# Patient Record
Sex: Male | Born: 1985
Health system: Southern US, Community
[De-identification: ages and names within clinical notes are randomized; demographics above are authoritative.]

## PROBLEM LIST (undated history)

## (undated) DIAGNOSIS — R0600 Dyspnea, unspecified: Secondary | ICD-10-CM

## (undated) DIAGNOSIS — H539 Unspecified visual disturbance: Secondary | ICD-10-CM

## (undated) DIAGNOSIS — F329 Major depressive disorder, single episode, unspecified: Secondary | ICD-10-CM

## (undated) DIAGNOSIS — F32A Depression, unspecified: Secondary | ICD-10-CM

## (undated) DIAGNOSIS — G35 Multiple sclerosis: Secondary | ICD-10-CM

## (undated) HISTORY — PX: NO PAST SURGERIES: SHX2092

## (undated) HISTORY — DX: Multiple sclerosis: G35

## (undated) HISTORY — DX: Unspecified visual disturbance: H53.9

---
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
## 2013-11-02 ENCOUNTER — Emergency Department (HOSPITAL_COMMUNITY)
Admission: EM | Admit: 2013-11-02 | Discharge: 2013-11-02 | Disposition: A | Discharge status: Home / Self Care | Payer: Self-pay | Attending: Emergency Medicine | Admitting: Emergency Medicine

## 2013-11-02 ENCOUNTER — Encounter (HOSPITAL_COMMUNITY): Payer: Self-pay | Admitting: Emergency Medicine

## 2013-11-02 DIAGNOSIS — R4182 Altered mental status, unspecified: Secondary | ICD-10-CM | POA: Insufficient documentation

## 2013-11-02 DIAGNOSIS — F1210 Cannabis abuse, uncomplicated: Secondary | ICD-10-CM | POA: Insufficient documentation

## 2013-11-02 DIAGNOSIS — F1910 Other psychoactive substance abuse, uncomplicated: Secondary | ICD-10-CM

## 2013-11-02 DIAGNOSIS — F17200 Nicotine dependence, unspecified, uncomplicated: Secondary | ICD-10-CM | POA: Insufficient documentation

## 2013-11-02 LAB — COMPREHENSIVE METABOLIC PANEL
ALBUMIN: 3.7 g/dL (ref 3.5–5.2)
ALT: 11 U/L (ref 0–53)
AST: 16 U/L (ref 0–37)
Alkaline Phosphatase: 73 U/L (ref 39–117)
BUN: 10 mg/dL (ref 6–23)
CALCIUM: 9 mg/dL (ref 8.4–10.5)
CO2: 24 mEq/L (ref 19–32)
CREATININE: 0.94 mg/dL (ref 0.50–1.35)
Chloride: 103 mEq/L (ref 96–112)
GFR calc Af Amer: >90 mL/min (ref >90)
GFR calc non Af Amer: >90 mL/min (ref >90)
Glucose, Bld: 101 mg/dL — ABNORMAL HIGH (ref 70–99)
POTASSIUM: 4.2 meq/L (ref 3.7–5.3)
Sodium: 140 mEq/L (ref 137–147)
TOTAL PROTEIN: 6.5 g/dL (ref 6.0–8.3)
Total Bilirubin: <0.2 mg/dL — ABNORMAL LOW (ref 0.3–1.2)

## 2013-11-02 LAB — CBC
HCT: 38.6 % — ABNORMAL LOW (ref 39.0–52.0)
Hemoglobin: 12.9 g/dL — ABNORMAL LOW (ref 13.0–17.0)
MCH: 29.9 pg (ref 26.0–34.0)
MCHC: 33.4 g/dL (ref 30.0–36.0)
MCV: 89.6 fL (ref 78.0–100.0)
Platelets: 250 10*3/uL (ref 150–400)
RBC: 4.31 MIL/uL (ref 4.22–5.81)
RDW: 13.2 % (ref 11.5–15.5)
WBC: 7.5 10*3/uL (ref 4.0–10.5)

## 2013-11-02 LAB — SALICYLATE LEVEL

## 2013-11-02 LAB — RAPID URINE DRUG SCREEN, HOSP PERFORMED
Amphetamines: NOT DETECTED
Barbiturates: NOT DETECTED
Benzodiazepines: NOT DETECTED
COCAINE: NOT DETECTED
Opiates: NOT DETECTED
Tetrahydrocannabinol: POSITIVE — AB

## 2013-11-02 LAB — ACETAMINOPHEN LEVEL

## 2013-11-02 LAB — ETHANOL

## 2013-11-02 NOTE — ED Notes (Addendum)
In addition to below note, pt states that he used to smoke marijuana regularly, quit smoking for 5-6 months, and smoked again today for the first time in 5-6 months. Pt on 5-lead cardiac monitor.

## 2013-11-02 NOTE — Progress Notes (Signed)
  CARE MANAGEMENT ED NOTE 11/02/2013  Patient:  Mike Lowery,Mike Lowery   Account Number:  [id number]  Date Initiated:  11/02/2013  Documentation initiated by:  Radford PaxFERRERO,AMY  Subjective/Objective Assessment:   Patient presents to  Ed with seizure like activity     Subjective/Objective Assessment Detail:     Action/Plan:   Action/Plan Detail:   Anticipated DC Date:  11/02/2013     Status Recommendation to Physician:   Result of Recommendation:    Other ED Services  Consult Working Plan    DC Planning Services  Other  PCP issues    Choice offered to / List presented to:            Status of service:  Completed, signed off  ED Comments:   ED Comments Detail:  EDCM spoke to patient at bedside.  Patient confirms he does not have  a pcp or insurance.  [facility] provided patient with a list of pcps whoa ccept self pay patients, address and phone number of [facility].  [facility] - Thurs from 9am to 1030am. [facility] informed [facility] that he may enroll into the orange card program there and speak to a financial counselor if needed, also would assist with his medications. [facility] provided phone number to inquire about Medicaid and Affordable Care Act. Patient reports he will applying for Medicaid on Monday. [facility] provided patient with discounted pharmacy list, and websites needymeds.org and Good [email or web address] for medication assist, financial resources in the community such as local churches, salvation army, urban ministries and a dental assistance for uninsured patients.  Patient thankful for resources.  No further EDCM needs ta this time.

## 2013-11-02 NOTE — Discharge Instructions (Signed)
Substance Abuse °Your exam indicates that you have a problem with substance abuse. Substance abuse is the misuse of alcohol or drugs that causes problems in family life, friendships, and work relationships. Substance abuse is the most important cause of premature illness, disability, and death in our society. It is also the greatest threat to a person's mental and spiritual well being. °Substance abuse can start out in an innocent way, such as social drinking or taking a little extra medication prescribed by your doctor. No one starts out with the intention of becoming an alcoholic or an addict. Substance abuse victims cannot control their use of alcohol or drugs. They may become intoxicated daily or go on weekend binges. Often there is a strong desire to quit, but attempts to stop using often fail. Encounters with law enforcement or conflicts with family members, friends, and work associates are signs of a potential problem. °Recovery is always possible, although the craving for some drugs makes it difficult to quit without assistance. Many treatment programs are available to help people stop abusing alcohol or drugs. The first step in treatment is to admit you have a problem. This is a major hurdle because denial is a powerful force with substance abuse. °Alcoholics Anonymous, Narcotics Anonymous, Cocaine Anonymous, and other recovery groups and programs can be very useful in helping people to quit. If you do not feel okay about your drug or alcohol use and if it is causing you trouble, we want to encourage you to talk about it with your doctor or with someone from a recovery group who can help you. You could also call the National Institute on Drug Abuse at 1-800-662-HELP. It is up to you to take the first step. °AL-ANON and ALA-TEEN are support groups for friends and family members of an alcohol or drug dependent person. The people who love and care for the alcoholic or addicted person often need help, too. For  information about these organizations, check your phone directory or call a local alcohol or drug treatment center. °Document Released: 06/11/2004 Document Revised: 07/27/2011 Document Reviewed: 05/05/2008 °ExitCare® Patient Information ©2015 [employer]. This information is not intended to replace advice given to you by your health care provider. Make sure you discuss any questions you have with your health care provider. ° ° ° °Emergency Department Resource Guide °1) Find a Doctor and Pay Out of Pocket °Although you won't have to find out who is covered by your insurance plan, it is a good idea to ask around and get recommendations. You will then need to call the office and see if the doctor you have chosen will accept you as a new patient and what types of options they offer for patients who are self-pay. Some doctors offer discounts or will set up payment plans for their patients who do not have insurance, but you will need to ask so you aren't surprised when you get to your appointment. ° °2) Contact Your Local Health Department °Not all health departments have doctors that can see patients for sick visits, but many do, so it is worth a call to see if yours does. If you don't know where your local health department is, you can check in your phone book. The CDC also has a tool to help you locate your state's health department, and many state websites also have listings of all of their local health departments. ° °3) Find a Walk-in Clinic °If your illness is not likely to be very severe or complicated, you   may want to try a walk in clinic. These are popping up all over the country in pharmacies, drugstores, and shopping centers. They're usually staffed by nurse practitioners or physician assistants that have been trained to treat common illnesses and complaints. They're usually fairly quick and inexpensive. However, if you have serious medical issues or chronic medical problems, these are probably not your best  option. ° °No Primary Care Doctor: °- Call Health Connect at  832-8000 - they can help you locate a primary care doctor that  accepts your insurance, provides certain services, etc. °- Physician Referral Service- 1-800-533-3463 ° °Chronic Pain Problems: °Organization         Address  Phone   Notes  °[facility]  (336) 297-2271 Patients need to be referred by their primary care doctor.  ° °Medication Assistance: °Organization         Address  Phone   Notes  °[place] [facility] [address] °[place], [place] [address] (336) 641-8030 --Must be a resident of [place] °-- Must have NO insurance coverage whatsoever (no Medicaid/ Medicare, etc.) °-- The pt. MUST have a primary care doctor that directs their care regularly and follows them in the community °  °[facility]  (866) 331-1348   °[employer]  (888) 892-1162   ° °Agencies that provide inexpensive medical care: °Organization         Address  Phone   Notes  °[facility]  (336) 832-8035   °[facility]    (336) 832-7272   °[facility] [address] °[place], [place] [address] (336) 832-4777   °[facility] [address], °[place] (336) 271-4999   °[facility]    (336) 373-0678   °[facility]    (336) 272-1050   °[facility] ° [address], [place] Phone:  (336) 832-4444, Fax:  (336) 832-4440 Hours of Operation:  9 am - 6 pm, M-F.  Also accepts Medicaid/Medicare and self-pay.  °[facility] ° [address], [place] Phone: (336) 832-3150, Fax: (336) 832-3151. Hours of Operation:  8:30 am - 5:30 pm, M-F.  Also accepts Medicaid and self-pay.  °[facility] [place] [address], [place] Phone: (336) 878-6027   °[facility] [address], [place], [place] (336)723-1848, Ext. 123 Mondays & Thursdays: 7-9 AM.  First 15  patients are seen on a first come, first serve basis. °  ° °Medicaid-accepting [place] Providers: ° °Organization         Address  Phone   Notes  °[facility] [address], [place] (336) 641-2100 Also accepts self-pay patients.  °[facility] [address], [place] ° (336) 856-9996   °[facility] [address], [place] (336) 288-8857   °[facility] [address] [place] [address], [place] (336) 299-7000   °Veita Bland [address], [place]  ° (336) 373-1557 Only accepts [place] Access Medicaid patients after they have their name applied to their card.  ° °Self-Pay (no insurance) in [place]: ° °Organization         Address  Phone   Notes  °Sickle Cell Patients, [facility] [address], [place] (336) 832-1970   °[facility] [address], [place] (  336) 832-4400   ° [facility] ° [address], [place] (336) 992-4800   °[facility]/Dr. Osei-Bonsu ° [address] [place] [address], [place] or [address], [place] (336) 841-8500 Phone number for both [place] and [place] locations is the same.  °[facility] [address], [place] (336) 299-0000   °[facility] [address] [place] [address], [place] or [address] (336) 852-7530 °(336) 878-2260   °[facility] [address], [place] (336) 350-1642, phone; (336) 294-5005, fax Sees patients 1st and 3rd Saturday of every month.  Must not qualify for public or private insurance (i.e. Medicaid, Medicare, [place] Health Choice, Veterans' Benefits) • Household income should be no more than 200% of the poverty level •The clinic cannot treat you if you are pregnant or think you are pregnant • Sexually transmitted diseases are not treated at the clinic.  ° ° °Dental  Care: °Organization         Address  Phone  Notes  °[place] [facility] [address], [place] (336) 641-6152 Accepts children up to age 21 who are enrolled in Medicaid or [place] Health Choice; pregnant women with a Medicaid card; and children who have applied for Medicaid or [place] Health Choice, but were declined, whose parents can pay a reduced fee at time of service.  °[facility] [place]  [address], [place] (336) 641-7733 Accepts children up to age 21 who are enrolled in Medicaid or [place] Health Choice; pregnant women with a Medicaid card; and children who have applied for Medicaid or [place] Health Choice, but were declined, whose parents can pay a reduced fee at time of service.  °[facility] ° [address], [place] (336) 641-4533 Patients are seen by appointment only. Walk-ins are not accepted. [facility] will see patients 18 years of age and older. °Monday - Tuesday (8am-5pm) °Most Wednesdays (8:30-5pm) °$30 per visit, cash only  °[facility] ° [address], [place] (336) 641-4533 Patients are seen by appointment only. Walk-ins are not accepted. [facility] will see patients 18 years of age and older. °One Wednesday Evening (Monthly: Volunteer Based).  $30 per visit, cash only  °[facility]  (919) 537-3737 for adults; Children under age 4, call Graduate Pediatric Dentistry at (919) 537-3956. Children aged 4-14, please call (919) 537-3737 to request a pediatric application. ° Dental services are provided in all areas of dental care including fillings, crowns and bridges, complete and partial dentures, implants, gum treatment, root canals, and extractions. Preventive care is also provided. Treatment is provided to both adults and children. °Patients are selected via a lottery and there is often a waiting list. °  °[facility] [address], ° ° (336) 763-8833 www.drcivils.com °  °[facility] [address], [place], [place] (336)723-1848, Ext. 123 Second and Fourth Thursday of each month, opens at 6:30 AM; Clinic ends at 9 AM.  Patients are seen on a first-come first-served basis, and a limited number are seen during each clinic.  ° °[facility] ° [address], [place], [place] (336) 723-7904   Eligibility Requirements °You must have lived in [place], [place], or [place] counties for at least the last three months. °  You cannot be eligible for   state or federal sponsored healthcare insurance, including Veterans Administration, Medicaid, or Medicare. °  You generally cannot be eligible for healthcare insurance through your employer.  °  How to apply: °Eligibility screenings are held every Tuesday and Wednesday afternoon from 1:00 pm until 4:00 pm. You do not need an appointment for the interview!  °[facility] [address], [place], [place] 336-631-2330   °[facility] [facility]  336-342-8273   °[facility]  336-703-3100   °[facility]  336-570-6415   ° °Behavioral Health Resources in the Community: °Intensive Outpatient Programs °Organization         Address  Phone  Notes  °[facility] [address], [place], [place] 336-878-6098   °[facility] [address], [place], [place] 336-832-9800   °[facility] [address], [place], [place] ° 336-882-2125   °[facility] [address],  °[place], [place] 1-800-853-5163 or 336-641-4981   °Substance Abuse Resources °Organization         Address  Phone  Notes  °[facility]  336-882-2125   °[facility]  336-784-9470   °[facility]  336-285-9073   °[facility]  336-845-3988   °Residential & Outpatient Substance Abuse Program  1-800-659-3381    °Psychological Services °Organization         Address  Phone  Notes  °[facility]  336- 832-9600   °[facility]  336- 378-7881   °[facility] [address], [place] 1-800-853-5163 or 336-641-4981   ° °Mobile Crisis Teams °Organization         Address  Phone  Notes  °[facility], Mobile Crisis Care Unit  1-877-626-1772   °Assertive °[facility] ° [address] [place], [place] 336-834-9664   °Sharon DeEsch [address] °[place] 336-554-5454   ° °Self-Help/Support Groups °Organization         Address  Phone             Notes  °[facility] [place] - variety of support groups  336- 373-1402 Call for more information  °Narcotics Anonymous (NA), [facility] [address], °[place]  2 meetings at this location  ° °Residential Treatment Programs °Organization         Address  Phone  Notes  °[facility] [address],    °[place]  1-866-801-8205   °[facility] ° [address], [place], [place] 704-293-8524   °[facility] [address], [place] 336-845-3988 Admissions: 8am-3pm M-F  °[facility] [address]    °[place], [place] 336-841-1104   °[facility] [address], [place], [place] 336-379-7146   °[facility] [address]  °[place], [place] 336-285-9073   °[facility] - Intensive Outpatient [address], [place], [place] 336-852-3033   °[facility] [address]  °[place]  1-877-615-2722 or 336-784-9470   °[facility] ([facility] [address] [place], [place] 336-227-7417 Accepts Medicaid  °[facility] [address]  °[place] 1-800-659-3381 Substance Abuse/Addiction Treatment  ° °[place] Behavioral Health Resources °Organization         Address  Phone  Notes  °[facility]  (888) 581-9988   °Julie Brannon, PhD [address] [place],    (  336) 349-5553 or (336) 951-0000   °[facility]   [address] °[place], [place] (336) 349-4454   °[facility] [address], [place], [place] (336) 342-8316 Insurance/Medicaid/sponsorship through [facility]  °[facility] [address]                                    [place], [place] (336) 342-8316 Therapy/tele-psych/case  °[facility] [address]  ° [place], [place] (336) 349-2233    °Dr. Arfeen  (336) 349-4544   °[facility] [place]  [employer] [place] [facility] 1) [address], [place] °2) [address], [place] °3)  [address], [place] (336) 349-3220 °(336) 342-7768 ° °(336) 342-8140   °[place] [facility] (336) 342-1394 or (336) 342-3537 (After Hours)    ° ° ° °
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
## 2013-11-02 NOTE — ED Notes (Addendum)
Pt states for the past 2-3 months pt has been having " seizure like activity". Pt states " I kind of black out"
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
## 2013-11-02 NOTE — ED Notes (Signed)
Patient discharged and left with clothes, phone, and keys

## 2013-12-24 ENCOUNTER — Inpatient Hospital Stay (HOSPITAL_COMMUNITY)
Admission: EM | Admit: 2013-12-24 | Discharge: 2013-12-27 | DRG: 060 | Disposition: A | Discharge status: Home / Self Care | Payer: Self-pay | Attending: Internal Medicine | Admitting: Internal Medicine

## 2013-12-24 ENCOUNTER — Emergency Department (HOSPITAL_COMMUNITY): Payer: Self-pay

## 2013-12-24 ENCOUNTER — Encounter (HOSPITAL_COMMUNITY): Payer: Self-pay | Admitting: Emergency Medicine

## 2013-12-24 DIAGNOSIS — F1210 Cannabis abuse, uncomplicated: Secondary | ICD-10-CM | POA: Diagnosis present

## 2013-12-24 DIAGNOSIS — R209 Unspecified disturbances of skin sensation: Secondary | ICD-10-CM

## 2013-12-24 DIAGNOSIS — Z5987 Material hardship due to limited financial resources, not elsewhere classified: Secondary | ICD-10-CM

## 2013-12-24 DIAGNOSIS — G35 Multiple sclerosis: Secondary | ICD-10-CM

## 2013-12-24 DIAGNOSIS — R202 Paresthesia of skin: Secondary | ICD-10-CM

## 2013-12-24 DIAGNOSIS — Z598 Other problems related to housing and economic circumstances: Secondary | ICD-10-CM

## 2013-12-24 DIAGNOSIS — F17200 Nicotine dependence, unspecified, uncomplicated: Secondary | ICD-10-CM | POA: Diagnosis present

## 2013-12-24 HISTORY — DX: Multiple sclerosis: G35

## 2013-12-24 LAB — URINALYSIS, ROUTINE W REFLEX MICROSCOPIC
Bilirubin Urine: NEGATIVE
GLUCOSE, UA: NEGATIVE mg/dL
Hgb urine dipstick: NEGATIVE
Ketones, ur: NEGATIVE mg/dL
Leukocytes, UA: NEGATIVE
NITRITE: NEGATIVE
PROTEIN: NEGATIVE mg/dL
Specific Gravity, Urine: 1.028 (ref 1.005–1.030)
Urobilinogen, UA: 1 mg/dL (ref 0.0–1.0)
pH: 6.5 (ref 5.0–8.0)

## 2013-12-24 LAB — CBC WITH DIFFERENTIAL/PLATELET
BASOS ABS: 0 10*3/uL (ref 0.0–0.1)
Basophils Relative: 0 % (ref 0–1)
EOS PCT: 5 % (ref 0–5)
Eosinophils Absolute: 0.3 10*3/uL (ref 0.0–0.7)
HEMATOCRIT: 41.6 % (ref 39.0–52.0)
HEMOGLOBIN: 13.8 g/dL (ref 13.0–17.0)
LYMPHS PCT: 44 % (ref 12–46)
Lymphs Abs: 2.9 10*3/uL (ref 0.7–4.0)
MCH: 30.5 pg (ref 26.0–34.0)
MCHC: 33.2 g/dL (ref 30.0–36.0)
MCV: 92 fL (ref 78.0–100.0)
MONO ABS: 0.5 10*3/uL (ref 0.1–1.0)
MONOS PCT: 8 % (ref 3–12)
Neutro Abs: 2.9 10*3/uL (ref 1.7–7.7)
Neutrophils Relative %: 43 % (ref 43–77)
Platelets: 240 10*3/uL (ref 150–400)
RBC: 4.52 MIL/uL (ref 4.22–5.81)
RDW: 13.7 % (ref 11.5–15.5)
WBC: 6.7 10*3/uL (ref 4.0–10.5)

## 2013-12-24 LAB — RAPID URINE DRUG SCREEN, HOSP PERFORMED
AMPHETAMINES: NOT DETECTED
Barbiturates: NOT DETECTED
Benzodiazepines: NOT DETECTED
Cocaine: NOT DETECTED
OPIATES: NOT DETECTED
Tetrahydrocannabinol: NOT DETECTED

## 2013-12-24 LAB — COMPREHENSIVE METABOLIC PANEL
ALT: 12 U/L (ref 0–53)
AST: 15 U/L (ref 0–37)
Albumin: 3.8 g/dL (ref 3.5–5.2)
Alkaline Phosphatase: 92 U/L (ref 39–117)
Anion gap: 10 (ref 5–15)
BILIRUBIN TOTAL: 0.3 mg/dL (ref 0.3–1.2)
BUN: 11 mg/dL (ref 6–23)
CALCIUM: 8.6 mg/dL (ref 8.4–10.5)
CO2: 26 meq/L (ref 19–32)
CREATININE: 0.87 mg/dL (ref 0.50–1.35)
Chloride: 104 mEq/L (ref 96–112)
GFR calc Af Amer: >90 mL/min (ref >90)
GFR calc non Af Amer: >90 mL/min (ref >90)
Glucose, Bld: 117 mg/dL — ABNORMAL HIGH (ref 70–99)
Potassium: 4.2 mEq/L (ref 3.7–5.3)
Sodium: 140 mEq/L (ref 137–147)
Total Protein: 6.3 g/dL (ref 6.0–8.3)

## 2013-12-24 MED ORDER — ACETAMINOPHEN 325 MG PO TABS: 650.0000 mg | ORAL_TABLET | Freq: Four times a day (QID) | ORAL | Status: DC | PRN

## 2013-12-24 MED ORDER — ONDANSETRON HCL 4 MG PO TABS: 4.0000 mg | ORAL_TABLET | Freq: Four times a day (QID) | ORAL | Status: DC | PRN

## 2013-12-24 MED ORDER — GADOBENATE DIMEGLUMINE 529 MG/ML IV SOLN
15.0000 mL | Freq: Once | INTRAVENOUS | Status: AC | PRN
  Administered 2013-12-24: 15 mL via INTRAVENOUS

## 2013-12-24 MED ORDER — ENOXAPARIN SODIUM 40 MG/0.4ML ~~LOC~~ SOLN
40.0000 mg | SUBCUTANEOUS | Status: DC
  Administered 2013-12-24: 40 mg via SUBCUTANEOUS
  Filled 2013-12-24 (×2): qty 0.4

## 2013-12-24 MED ORDER — ONDANSETRON HCL 4 MG/2ML IJ SOLN: 4.0000 mg | Freq: Four times a day (QID) | INTRAMUSCULAR | Status: DC | PRN

## 2013-12-24 MED ORDER — NICOTINE 14 MG/24HR TD PT24
14.0000 mg | MEDICATED_PATCH | Freq: Every day | TRANSDERMAL | Status: DC
  Filled 2013-12-24: qty 1

## 2013-12-24 MED ORDER — GABAPENTIN 100 MG PO CAPS
100.0000 mg | ORAL_CAPSULE | Freq: Three times a day (TID) | ORAL | Status: DC
  Administered 2013-12-25 – 2013-12-27 (×8): 100 mg via ORAL
  Filled 2013-12-24 (×9): qty 1

## 2013-12-24 MED ORDER — PANTOPRAZOLE SODIUM 40 MG PO TBEC
40.0000 mg | DELAYED_RELEASE_TABLET | Freq: Every day | ORAL | Status: DC
  Administered 2013-12-24 – 2013-12-27 (×4): 40 mg via ORAL
  Filled 2013-12-24 (×4): qty 1

## 2013-12-24 MED ORDER — ACETAMINOPHEN 650 MG RE SUPP: 650.0000 mg | Freq: Four times a day (QID) | RECTAL | Status: DC | PRN

## 2013-12-24 MED ORDER — SODIUM CHLORIDE 0.9 % IV SOLN
500.0000 mg | Freq: Two times a day (BID) | INTRAVENOUS | Status: AC
  Administered 2013-12-24 – 2013-12-27 (×6): 500 mg via INTRAVENOUS
  Filled 2013-12-24 (×6): qty 4

## 2013-12-24 NOTE — Consult Note (Signed)
Reason for Consult: Left-sided numbness and mild weakness.  HPI:                                                                                                                                          Mike Lowery is an 28 y.o. male with no known medical disorder presenting with numbness involving his left side for one week as well as mild weakness of left extremities. He had a similar episode of numbness about 3 months ago which subsided without treatment intervention. He said no visual changes and no changes in speech. He's also had no bowel or bladder control issues. MRI of brain and spinal cord showed findings compatible with multiple sclerosis. There was evidence of acute demyelination involving the spinal cord at the C5 level. Patient is being admitted for treatment with high-dose steroids intravenously, as well as occupational and physical therapy intervention.  History reviewed. No pertinent past medical history.  History reviewed. No pertinent past surgical history.  No family history on file.  Social History:  reports that he has been smoking Cigarettes.  He has been smoking about 0.00 packs per day. He does not have any smokeless tobacco history on file. He reports that he drinks alcohol. He reports that he uses illicit drugs (Marijuana).  No Known Allergies  MEDICATIONS:                                                                                                                     No medications prior to admission   ROS:                                                                                                                                       History obtained from the patient  General ROS: negative for - chills,  fatigue, fever, night sweats, weight gain or weight loss Psychological ROS: negative for - behavioral disorder, hallucinations, memory difficulties, mood swings or suicidal ideation Ophthalmic ROS: negative for - blurry vision, double vision, eye pain  or loss of vision ENT ROS: negative for - epistaxis, nasal discharge, oral lesions, sore throat, tinnitus or vertigo Allergy and Immunology ROS: negative for - hives or itchy/watery eyes Hematological and Lymphatic ROS: negative for - bleeding problems, bruising or swollen lymph nodes Endocrine ROS: negative for - galactorrhea, hair pattern changes, polydipsia/polyuria or temperature intolerance Respiratory ROS: negative for - cough, hemoptysis, shortness of breath or wheezing Cardiovascular ROS: negative for - chest pain, dyspnea on exertion, edema or irregular heartbeat Gastrointestinal ROS: negative for - abdominal pain, diarrhea, hematemesis, nausea/vomiting or stool incontinence Genito-Urinary ROS: negative for - dysuria, hematuria, incontinence or urinary frequency/urgency Musculoskeletal ROS: negative for - joint swelling or muscular weakness Neurological ROS: as noted in HPI Dermatological ROS: negative for rash and skin lesion changes   Blood pressure 117/71, pulse 74, temperature 97.8 F (36.6 C), temperature source Oral, resp. rate 17, height '5\' 7"'  (1.702 m), weight 73.029 kg (161 lb), SpO2 98.00%.   Neurologic Examination:                                                                                                      Mental Status: Alert, oriented, thought content appropriate.  Speech fluent without evidence of aphasia. Able to follow commands without difficulty. Cranial Nerves: II-Visual fields were normal. III/IV/VI-Pupils were equal and reacted. Extraocular movements were full and conjugate.    V/VII-no facial numbness and no facial weakness. VIII-normal. X-normal speech and symmetrical palatal movement. Motor: Mild weakness of left upper extremity distally, including intrinsic hand muscles as well as reduced grip strength compared to the right; slight weakness of left hip flexors (5-/5); normal motor exam otherwise. Sensory: Reduced perception of tactile sensation  over left upper and lower extremities compared to right extremities. Deep Tendon Reflexes: 2+, brisk throughout, and symmetrical. Plantars: Flexor bilaterally Cerebellar: Normal finger-to-nose testing.  No results found for this basename: cbc, bmp, coags, chol, tri, ldl, hga1c    Results for orders placed during the hospital encounter of 12/24/13 (from the past 48 hour(s))  CBC WITH DIFFERENTIAL     Status: None   Collection Time    12/24/13  4:30 PM      Result Value Ref Range   WBC 6.7  4.0 - 10.5 K/uL   RBC 4.52  4.22 - 5.81 MIL/uL   Hemoglobin 13.8  13.0 - 17.0 g/dL   HCT 41.6  39.0 - 52.0 %   MCV 92.0  78.0 - 100.0 fL   MCH 30.5  26.0 - 34.0 pg   MCHC 33.2  30.0 - 36.0 g/dL   RDW 13.7  11.5 - 15.5 %   Platelets 240  150 - 400 K/uL   Neutrophils Relative % 43  43 - 77 %   Neutro Abs 2.9  1.7 - 7.7 K/uL   Lymphocytes Relative 44  12 - 46 %   Lymphs Abs  2.9  0.7 - 4.0 K/uL   Monocytes Relative 8  3 - 12 %   Monocytes Absolute 0.5  0.1 - 1.0 K/uL   Eosinophils Relative 5  0 - 5 %   Eosinophils Absolute 0.3  0.0 - 0.7 K/uL   Basophils Relative 0  0 - 1 %   Basophils Absolute 0.0  0.0 - 0.1 K/uL  COMPREHENSIVE METABOLIC PANEL     Status: Abnormal   Collection Time    12/24/13  4:30 PM      Result Value Ref Range   Sodium 140  137 - 147 mEq/L   Potassium 4.2  3.7 - 5.3 mEq/L   Chloride 104  96 - 112 mEq/L   CO2 26  19 - 32 mEq/L   Glucose, Bld 117 (*) 70 - 99 mg/dL   BUN 11  6 - 23 mg/dL   Creatinine, Ser 0.87  0.50 - 1.35 mg/dL   Calcium 8.6  8.4 - 10.5 mg/dL   Total Protein 6.3  6.0 - 8.3 g/dL   Albumin 3.8  3.5 - 5.2 g/dL   AST 15  0 - 37 U/L   ALT 12  0 - 53 U/L   Alkaline Phosphatase 92  39 - 117 U/L   Total Bilirubin 0.3  0.3 - 1.2 mg/dL   GFR calc non Af Amer >90  >90 mL/min   GFR calc Af Amer >90  >90 mL/min   Comment: (NOTE)     The eGFR has been calculated using the CKD EPI equation.     This calculation has not been validated in all clinical situations.      eGFR's persistently <90 mL/min signify possible Chronic Kidney     Disease.   Anion gap 10  5 - 15  URINALYSIS, ROUTINE W REFLEX MICROSCOPIC     Status: None   Collection Time    12/24/13  5:00 PM      Result Value Ref Range   Color, Urine YELLOW  YELLOW   APPearance CLEAR  CLEAR   Specific Gravity, Urine 1.028  1.005 - 1.030   pH 6.5  5.0 - 8.0   Glucose, UA NEGATIVE  NEGATIVE mg/dL   Hgb urine dipstick NEGATIVE  NEGATIVE   Bilirubin Urine NEGATIVE  NEGATIVE   Ketones, ur NEGATIVE  NEGATIVE mg/dL   Protein, ur NEGATIVE  NEGATIVE mg/dL   Urobilinogen, UA 1.0  0.0 - 1.0 mg/dL   Nitrite NEGATIVE  NEGATIVE   Leukocytes, UA NEGATIVE  NEGATIVE   Comment: MICROSCOPIC NOT DONE ON URINES WITH NEGATIVE PROTEIN, BLOOD, LEUKOCYTES, NITRITE, OR GLUCOSE <1000 mg/dL.  URINE RAPID DRUG SCREEN (HOSP PERFORMED)     Status: None   Collection Time    12/24/13  5:00 PM      Result Value Ref Range   Opiates NONE DETECTED  NONE DETECTED   Cocaine NONE DETECTED  NONE DETECTED   Benzodiazepines NONE DETECTED  NONE DETECTED   Amphetamines NONE DETECTED  NONE DETECTED   Tetrahydrocannabinol NONE DETECTED  NONE DETECTED   Barbiturates NONE DETECTED  NONE DETECTED   Comment:            DRUG SCREEN FOR MEDICAL PURPOSES     ONLY.  IF CONFIRMATION IS NEEDED     FOR ANY PURPOSE, NOTIFY LAB     WITHIN 5 DAYS.                LOWEST DETECTABLE LIMITS     FOR URINE  DRUG SCREEN     Drug Class       Cutoff (ng/mL)     Amphetamine      1000     Barbiturate      200     Benzodiazepine   580     Tricyclics       998     Opiates          300     Cocaine          300     THC              50    Mr Jeri Cos Wo Contrast  12/24/2013   CLINICAL DATA:  28 year old male with numbness in the left fingers x1 week. Prior similar episode 5 months ago. Initial encounter.  EXAM: MRI HEAD WITHOUT AND WITH CONTRAST  MRI CERVICAL SPINE WITHOUT AND WITH CONTRAST  TECHNIQUE: Multiplanar, multiecho pulse sequences of the  brain and surrounding structures, and cervical spine, to include the craniocervical junction and cervicothoracic junction, were obtained without and with intravenous contrast.  CONTRAST:  18m MULTIHANCE GADOBENATE DIMEGLUMINE 529 MG/ML IV SOLN  COMPARISON:  None.  FINDINGS: MRI HEAD FINDINGS  Multifocal nodular T2 and FLAIR hyperintense lesions in the cerebral white matter, those near the lateral ventricles oriented perpendicular. See series 12. Significant bilateral temporal lobe white matter involvement. Mild thinning of the anterior body of the corpus callosum.  No lesions are identified with restricted diffusion. No enhancing lesions are identified.  The deep gray matter nuclei, brainstem and cerebellum are spared. Major intracranial vascular flow voids are preserved.  No restricted diffusion to suggest acute infarction. No midline shift, mass effect, evidence of mass lesion, ventriculomegaly, extra-axial collection or acute intracranial hemorrhage. Cervicomedullary junction and pituitary are within normal limits. No cortical encephalomalacia. Visible internal auditory structures appear normal. Visualized paranasal sinuses and mastoids are clear aside from minor mucosal thickening in the left maxillary sinus. Grossly normal orbits soft tissues. Visualized scalp soft tissues are within normal limits. Normal bone marrow signal.  MRI CERVICAL SPINE FINDINGS  Multiple T2 and STIR hyperintense lesions in the cervical spinal cord, including a fairly long segment involvement from the C3-C4 level to the C5-C6 level (series 1400 and 1900). The dorsal aspect of that plaque at the C5 level demonstrates subtle enhancement (series 22, image 27). There is a nonenhancing plaque extending from the C6-C7 level to the T1-T2 level. There is a subtle plaque in the upper cord at the C2-C3 level eccentric to the left.  Minimal cord expansion occurs with the larger plaques. Cord signal at the cervicomedullary junction and extending  into the T3-T4 upper thoracic spine levels appears within normal limits.  Mild straightening of cervical lordosis. No marrow edema or evidence of acute osseous abnormality. No significant cervical spine degenerative changes or spinal stenosis. Negative paraspinal soft tissues. No other abnormal enhancement.  IMPRESSION: Signal abnormality most compatible with multiple sclerosis affecting the brain and upper spinal cord. Evidence of acute demyelination in the spinal cord at the C5 level.   Electronically Signed   By: LLars PinksM.D.   On: 12/24/2013 19:58   Mr Cervical Spine W Wo Contrast  12/24/2013   CLINICAL DATA:  28year old male with numbness in the left fingers x1 week. Prior similar episode 5 months ago. Initial encounter.  EXAM: MRI HEAD WITHOUT AND WITH CONTRAST  MRI CERVICAL SPINE WITHOUT AND WITH CONTRAST  TECHNIQUE: Multiplanar, multiecho pulse sequences of the brain and  surrounding structures, and cervical spine, to include the craniocervical junction and cervicothoracic junction, were obtained without and with intravenous contrast.  CONTRAST:  60m MULTIHANCE GADOBENATE DIMEGLUMINE 529 MG/ML IV SOLN  COMPARISON:  None.  FINDINGS: MRI HEAD FINDINGS  Multifocal nodular T2 and FLAIR hyperintense lesions in the cerebral white matter, those near the lateral ventricles oriented perpendicular. See series 12. Significant bilateral temporal lobe white matter involvement. Mild thinning of the anterior body of the corpus callosum.  No lesions are identified with restricted diffusion. No enhancing lesions are identified.  The deep gray matter nuclei, brainstem and cerebellum are spared. Major intracranial vascular flow voids are preserved.  No restricted diffusion to suggest acute infarction. No midline shift, mass effect, evidence of mass lesion, ventriculomegaly, extra-axial collection or acute intracranial hemorrhage. Cervicomedullary junction and pituitary are within normal limits. No cortical  encephalomalacia. Visible internal auditory structures appear normal. Visualized paranasal sinuses and mastoids are clear aside from minor mucosal thickening in the left maxillary sinus. Grossly normal orbits soft tissues. Visualized scalp soft tissues are within normal limits. Normal bone marrow signal.  MRI CERVICAL SPINE FINDINGS  Multiple T2 and STIR hyperintense lesions in the cervical spinal cord, including a fairly long segment involvement from the C3-C4 level to the C5-C6 level (series 1400 and 1900). The dorsal aspect of that plaque at the C5 level demonstrates subtle enhancement (series 22, image 27). There is a nonenhancing plaque extending from the C6-C7 level to the T1-T2 level. There is a subtle plaque in the upper cord at the C2-C3 level eccentric to the left.  Minimal cord expansion occurs with the larger plaques. Cord signal at the cervicomedullary junction and extending into the T3-T4 upper thoracic spine levels appears within normal limits.  Mild straightening of cervical lordosis. No marrow edema or evidence of acute osseous abnormality. No significant cervical spine degenerative changes or spinal stenosis. Negative paraspinal soft tissues. No other abnormal enhancement.  IMPRESSION: Signal abnormality most compatible with multiple sclerosis affecting the brain and upper spinal cord. Evidence of acute demyelination in the spinal cord at the C5 level.   Electronically Signed   By: LLars PinksM.D.   On: 12/24/2013 19:58    Assessment/Plan: 28year old man with findings consistent with multiple sclerosis with no previous medical documentation, presenting with acute MS exacerbation involving cervical spinal cord as described above.  Recommendations: 1. Solu-Medrol 500 mg IV every 12 hours for total of 6 doses. 2. Protonix 40 mg per day. 3. Physical therapy and occupational therapy intervention. 4. Steroid taper with prednisone starting at 60 mg per day and tapering by 10 mg per day at the  end of Solu-Medrol treatment.  We will continue to follow this patient closely with you.  C.R. SNicole Kindred MD [facility] [phone] 12/24/2013, 9:12 PM

## 2013-12-24 NOTE — ED Notes (Signed)
Pt c/o progressing numbness x 1 week.  Pt states numbness started in L foot and L hand and progressed up limbs and now is in L chest and is starting in R foot.  State he had same s/s 5 months ago that resolved on their own.  Grips equal, no facial droop.  Pt able to ambulate without difficulty.

## 2013-12-24 NOTE — ED Notes (Signed)
Pt to MRI

## 2013-12-24 NOTE — ED Provider Notes (Signed)
CSN: [id number]     Arrival date & time 12/24/13  1326 History   First MD Initiated Contact with Patient 12/24/13 1522     Chief Complaint  Patient presents with  . Numbness     (Consider location/radiation/quality/duration/timing/severity/associated sxs/prior Treatment) The history is provided by the patient and medical records. No language interpreter was used.    Mike Lowery is a 28 y.o. male  with no known medical hx presents to the Emergency Department complaining of gradual, persistent, progressively worsening "numbness" described as a tingling onset 1 week.  Pt reports it started from the tips of his fingers and toes at the same and progressed up the extremities through the trunk without involvement of the face of neck.  Pt reports yesterday it began in the right foot and has not progressed.  It does not proceed to the right hand.  Pt denies hx of neck or back pain, known trauma, MVC, falls or fights.  Pt denies personal or family hx of MD, MS or GB.  Pt denies recent medications, vaccines, or medical treatments.  No associated symptoms.  Nothing makes it better and nothing makes it worse.  Pt reports he is able to walk without difficulty.  He has not fallen because of this and is able to grip things well without dropping them.  Pt denies fever, chills, headache, neck pain, chest pain, SOB, abd pain, N/V/D, weakness, dizziness, syncope, dysuria, hematuria, vision changes.  Pt is right hand dominant.        History reviewed. No pertinent past medical history. History reviewed. No pertinent past surgical history. No family history on file. History  Substance Use Topics  . Smoking status: Current Every Day Smoker    Types: Cigarettes  . Smokeless tobacco: Not on file  . Alcohol Use: Yes     Comment: 8 oz liquor q other day    Review of Systems  Constitutional: Negative for fever, diaphoresis, appetite change, fatigue and unexpected weight change.  HENT: Negative for mouth sores.    Eyes: Negative for visual disturbance.  Respiratory: Negative for cough, chest tightness, shortness of breath and wheezing.   Cardiovascular: Negative for chest pain.  Gastrointestinal: Negative for nausea, vomiting, abdominal pain, diarrhea and constipation.  Endocrine: Negative for polydipsia, polyphagia and polyuria.  Genitourinary: Negative for dysuria, urgency, frequency and hematuria.  Musculoskeletal: Negative for back pain and neck stiffness.  Skin: Negative for rash.  Allergic/Immunologic: Negative for immunocompromised state.  Neurological: Positive for numbness (paresthesias). Negative for syncope, light-headedness and headaches.  Hematological: Does not bruise/bleed easily.  Psychiatric/Behavioral: Negative for sleep disturbance. The patient is not nervous/anxious.       Allergies  Review of patient's allergies indicates no known allergies.  Home Medications   Prior to Admission medications   Not on File   BP 117/71  Pulse 74  Temp(Src) 97.8 F (36.6 C) (Oral)  Resp 17  Ht 5\' 7"  (1.702 m)  Wt 161 lb (73.029 kg)  BMI 25.21 kg/m2  SpO2 98% Physical Exam  Nursing note and vitals reviewed. Constitutional: He is oriented to person, place, and time. He appears well-developed and well-nourished. No distress.  HENT:  Head: Normocephalic and atraumatic.  Mouth/Throat: Oropharynx is clear and moist. No oropharyngeal exudate.  Eyes: Conjunctivae and EOM are normal. Pupils are equal, round, and reactive to light. No scleral icterus.  No horizontal, vertical or rotational nystagmus  Neck: Normal range of motion. Neck supple.  Full active and passive ROM without pain No  midline or paraspinal tenderness No nuchal rigidity or meningeal signs  Cardiovascular: Normal rate, regular rhythm, normal heart sounds and intact distal pulses.   No murmur heard. Pulmonary/Chest: Effort normal and breath sounds normal. No respiratory distress. He has no wheezes. He has no rales.   Abdominal: Soft. Bowel sounds are normal. He exhibits no distension. There is no tenderness. There is no rebound and no guarding.  Musculoskeletal: Normal range of motion.  Full range of motion of the T-spine and L-spine No tenderness to palpation of the spinous processes of the T-spine or L-spine No tenderness to palpation of the paraspinous muscles of the L-spine No reproduction of the paresthesias with axial load test  Lymphadenopathy:    He has no cervical adenopathy.  Neurological: He is alert and oriented to person, place, and time. He has normal reflexes. No cranial nerve deficit. He exhibits normal muscle tone. Coordination normal.  Mental Status:  Alert, oriented, thought content appropriate. Speech fluent without evidence of aphasia. Able to follow 2 step commands without difficulty.  Cranial Nerves:  II:  Peripheral visual fields grossly normal, pupils equal, round, reactive to light III,IV, VI: ptosis not present, extra-ocular motions intact bilaterally  V,VII: smile symmetric, facial light touch sensation equal VIII: hearing grossly normal bilaterally  IX,X: gag reflex present  XI: bilateral shoulder shrug equal and strong XII: midline tongue extension  Motor:  5/5 in upper and lower extremities bilaterally including strong and equal grip strength and dorsiflexion/plantar flexion Sensory: Pinprick and light touch normal in all extremities.  Deep Tendon Reflexes: 2+ and symmetric  Cerebellar: normal finger-to-nose with bilateral upper extremities Gait: normal gait and balance CV: distal pulses palpable throughout   Skin: Skin is warm and dry. No rash noted. He is not diaphoretic. No erythema.  Psychiatric: He has a normal mood and affect. His behavior is normal. Judgment and thought content normal.    ED Course  Procedures (including critical care time) Labs Review Labs Reviewed  COMPREHENSIVE METABOLIC PANEL - Abnormal; Notable for the following:    Glucose, Bld 117  (*)    All other components within normal limits  CBC WITH DIFFERENTIAL  URINALYSIS, ROUTINE W REFLEX MICROSCOPIC  URINE RAPID DRUG SCREEN (HOSP PERFORMED)    Imaging Review Mr Lodema Pilot Contrast  12/24/2013   CLINICAL DATA:  28 year old male with numbness in the left fingers x1 week. Prior similar episode 5 months ago. Initial encounter.  EXAM: MRI HEAD WITHOUT AND WITH CONTRAST  MRI CERVICAL SPINE WITHOUT AND WITH CONTRAST  TECHNIQUE: Multiplanar, multiecho pulse sequences of the brain and surrounding structures, and cervical spine, to include the craniocervical junction and cervicothoracic junction, were obtained without and with intravenous contrast.  CONTRAST:  15mL MULTIHANCE GADOBENATE DIMEGLUMINE 529 MG/ML IV SOLN  COMPARISON:  None.  FINDINGS: MRI HEAD FINDINGS  Multifocal nodular T2 and FLAIR hyperintense lesions in the cerebral white matter, those near the lateral ventricles oriented perpendicular. See series 12. Significant bilateral temporal lobe white matter involvement. Mild thinning of the anterior body of the corpus callosum.  No lesions are identified with restricted diffusion. No enhancing lesions are identified.  The deep gray matter nuclei, brainstem and cerebellum are spared. Major intracranial vascular flow voids are preserved.  No restricted diffusion to suggest acute infarction. No midline shift, mass effect, evidence of mass lesion, ventriculomegaly, extra-axial collection or acute intracranial hemorrhage. Cervicomedullary junction and pituitary are within normal limits. No cortical encephalomalacia. Visible internal auditory structures appear normal. Visualized paranasal sinuses and mastoids  are clear aside from minor mucosal thickening in the left maxillary sinus. Grossly normal orbits soft tissues. Visualized scalp soft tissues are within normal limits. Normal bone marrow signal.  MRI CERVICAL SPINE FINDINGS  Multiple T2 and STIR hyperintense lesions in the cervical spinal cord,  including a fairly long segment involvement from the C3-C4 level to the C5-C6 level (series 1400 and 1900). The dorsal aspect of that plaque at the C5 level demonstrates subtle enhancement (series 22, image 27). There is a nonenhancing plaque extending from the C6-C7 level to the T1-T2 level. There is a subtle plaque in the upper cord at the C2-C3 level eccentric to the left.  Minimal cord expansion occurs with the larger plaques. Cord signal at the cervicomedullary junction and extending into the T3-T4 upper thoracic spine levels appears within normal limits.  Mild straightening of cervical lordosis. No marrow edema or evidence of acute osseous abnormality. No significant cervical spine degenerative changes or spinal stenosis. Negative paraspinal soft tissues. No other abnormal enhancement.  IMPRESSION: Signal abnormality most compatible with multiple sclerosis affecting the brain and upper spinal cord. Evidence of acute demyelination in the spinal cord at the C5 level.   Electronically Signed   By: Augusto Gamble M.D.   On: 12/24/2013 19:58   Mr Cervical Spine W Wo Contrast  12/24/2013   CLINICAL DATA:  28 year old male with numbness in the left fingers x1 week. Prior similar episode 5 months ago. Initial encounter.  EXAM: MRI HEAD WITHOUT AND WITH CONTRAST  MRI CERVICAL SPINE WITHOUT AND WITH CONTRAST  TECHNIQUE: Multiplanar, multiecho pulse sequences of the brain and surrounding structures, and cervical spine, to include the craniocervical junction and cervicothoracic junction, were obtained without and with intravenous contrast.  CONTRAST:  15mL MULTIHANCE GADOBENATE DIMEGLUMINE 529 MG/ML IV SOLN  COMPARISON:  None.  FINDINGS: MRI HEAD FINDINGS  Multifocal nodular T2 and FLAIR hyperintense lesions in the cerebral white matter, those near the lateral ventricles oriented perpendicular. See series 12. Significant bilateral temporal lobe white matter involvement. Mild thinning of the anterior body of the corpus  callosum.  No lesions are identified with restricted diffusion. No enhancing lesions are identified.  The deep gray matter nuclei, brainstem and cerebellum are spared. Major intracranial vascular flow voids are preserved.  No restricted diffusion to suggest acute infarction. No midline shift, mass effect, evidence of mass lesion, ventriculomegaly, extra-axial collection or acute intracranial hemorrhage. Cervicomedullary junction and pituitary are within normal limits. No cortical encephalomalacia. Visible internal auditory structures appear normal. Visualized paranasal sinuses and mastoids are clear aside from minor mucosal thickening in the left maxillary sinus. Grossly normal orbits soft tissues. Visualized scalp soft tissues are within normal limits. Normal bone marrow signal.  MRI CERVICAL SPINE FINDINGS  Multiple T2 and STIR hyperintense lesions in the cervical spinal cord, including a fairly long segment involvement from the C3-C4 level to the C5-C6 level (series 1400 and 1900). The dorsal aspect of that plaque at the C5 level demonstrates subtle enhancement (series 22, image 27). There is a nonenhancing plaque extending from the C6-C7 level to the T1-T2 level. There is a subtle plaque in the upper cord at the C2-C3 level eccentric to the left.  Minimal cord expansion occurs with the larger plaques. Cord signal at the cervicomedullary junction and extending into the T3-T4 upper thoracic spine levels appears within normal limits.  Mild straightening of cervical lordosis. No marrow edema or evidence of acute osseous abnormality. No significant cervical spine degenerative changes or spinal stenosis. Negative paraspinal  soft tissues. No other abnormal enhancement.  IMPRESSION: Signal abnormality most compatible with multiple sclerosis affecting the brain and upper spinal cord. Evidence of acute demyelination in the spinal cord at the C5 level.   Electronically Signed   By: Augusto Gamble M.D.   On: 12/24/2013 19:58      EKG Interpretation None      MDM   Final diagnoses:  Paresthesias  Multiple sclerosis   Mike Lowery presents with subjective paresthesias of the entire left side of the body in right foot. Patient with normal objective neurologic exam without deficits. Ambulates with steady gait. Sensation intact to dull and sharp in all extremities.  Will obtain screening labs and consult neurology.  4:20PM Discussed with Dr Arvilla Market who recommends MRI brain and cervical spine with and without contrast.  8:34 PM MRI with Signal abnormality most compatible with multiple sclerosis affecting the brain and upper spinal cord. Evidence of acute demyelination in the spinal cord at the C5 level.  Discussed with Dr. Roseanne Reno who will consult.  Pt to be admitted to hospitalist.    BP 117/71  Pulse 74  Temp(Src) 97.8 F (36.6 C) (Oral)  Resp 17  Ht 5\' 7"  (1.702 m)  Wt 161 lb (73.029 kg)  BMI 25.21 kg/m2  SpO2 98%  8:44 PM Discussed with Dr. Burtis Junes of Internal Medicine who will admit.           Dahlia Client Talli Kimmer, PA-C 12/24/13 2044

## 2013-12-24 NOTE — ED Notes (Signed)
Remains in MRI 

## 2013-12-24 NOTE — Progress Notes (Signed)
Pt transferred to unit from ED via NT x 1. Pt oriented to room and made comfortable. No signs or symptoms of acute distress. No complaints of pain or discomfort. Pt resting in bed at lowest position, with call light in reach. Will continue to monitor. Delfino Lovett, RN, BSN 12/24/2013 10:00 PM

## 2013-12-24 NOTE — H&P (Signed)
Date: 12/24/2013               Patient Name:  Mike Lowery MRN: [id number]  DOB: Feb 26, 1986 Age / Sex: 28 y.o., male   PCP: No primary provider on file.         Medical Service: Internal Medicine Teaching Service         Attending Physician: Dr. Farley Ly, MD    First Contact: Dr. Glenard Haring Pager: [phone]  Second Contact: Dr. Zada Girt Pager: [phone]       After Hours (After 5p/  First Contact Pager: [phone]  weekends / holidays): Second Contact Pager: [phone]   Chief Complaint: Left sided numbness  History of Present Illness: Mike Lowery is a 28 year old man with no significant medical history presenting with left sided numbness x 1 week. The weakness started in his hands and feet and progressed to his entire left side to the level of about C5/T1. He also is starting to feel some numbness in his RLE that started from his toes and has progressed to midthigh. He denies recent travel. No sick contacts. Endorses fatigue, urinary incontinence and paresthesias. Denies fevers, chills, vision change, cough, trouble swallowing, shortness of breath, chest pain, nausea, vomiting diarrhea, rash, trouble walking. He had a similar episode 4-5 months ago that was milder in presentation and resolved spontaneously after a week. Denies family history of neurologic problems. Works in [employer].  Meds: Current Facility-Administered Medications  Medication Dose Route Frequency Provider Last Rate Last Dose  . acetaminophen (TYLENOL) tablet 650 mg  650 mg Oral Q6H PRN Christen Bame, MD       Or  . acetaminophen (TYLENOL) suppository 650 mg  650 mg Rectal Q6H PRN Christen Bame, MD      . enoxaparin (LOVENOX) injection 40 mg  40 mg Subcutaneous Q24H Christen Bame, MD   40 mg at 12/24/13 2151  . methylPREDNISolone sodium succinate (SOLU-MEDROL) 500 mg in sodium chloride 0.9 % 50 mL IVPB  500 mg Intravenous Q12H Christen Bame, MD      . nicotine (NICODERM CQ - dosed in mg/24 hours) patch 14 mg  14 mg Transdermal QHS  Christen Bame, MD      . ondansetron [facility]) tablet 4 mg  4 mg Oral Q6H PRN Christen Bame, MD       Or  . ondansetron [facility]) injection 4 mg  4 mg Intravenous Q6H PRN Christen Bame, MD      . pantoprazole (PROTONIX) EC tablet 40 mg  40 mg Oral Daily Christen Bame, MD   40 mg at 12/24/13 2150    Allergies: Allergies as of 12/24/2013  . (No Known Allergies)   History reviewed. No pertinent past medical history. History reviewed. No pertinent past surgical history. No family history on file. History   Social History  . Marital Status: Single    Spouse Name: N/A    Number of Children: N/A  . Years of Education: N/A   Occupational History  . Not on file.   Social History Main Topics  . Smoking status: Current Every Day Smoker    Types: Cigarettes  . Smokeless tobacco: Not on file  . Alcohol Use: Yes     Comment: 8 oz liquor q other day  . Drug Use: Yes    Special: Marijuana  . Sexual Activity: Not on file   Other Topics Concern  . Not on file   Social History Narrative  . No narrative on file    Review  of Systems: Constitutional: no fevers/chills Eyes: no vision changes Ears, nose, mouth, throat, and face: no cough Respiratory: no shortness of breath Cardiovascular: no chest pain Gastrointestinal: no nausea/vomiting, no abdominal pain, no constipation, no diarrhea Genitourinary: +urinary incontinence Integument: no rash Hematologic/lymphatic: no bleeding/bruising, no edema Musculoskeletal: no arthralgias, no myalgias Neurological: +paresthesias  Physical Exam: Blood pressure 123/58, pulse 68, temperature 98 F (36.7 C), temperature source Oral, resp. rate 16, height 5\' 7"  (1.702 m), weight 163 lb 14.4 oz (74.345 kg), SpO2 100.00%. General Apperance: NAD Head: Normocephalic, atraumatic Eyes: PERRL, EOMI, anicteric sclera Ears: Nares normal, septum midline, mucosa normal Throat: Lips, mucosa and tongue normal  Neck: Supple, trachea midline Back: No tenderness or bony  abnormality  Lungs: Clear to auscultation bilaterally. No wheezes, rhonchi or rales. Breathing comfortably Chest Wall: Nontender, no deformity Heart: Regular rate and rhythm, no murmur/rub/gallop Abdomen: Soft, nontender, nondistended, no rebound/guarding Extremities: Normal, atraumatic, warm and well perfused, no edema Pulses: 2+ throughout Skin: No rashes or lesions Neurologic: Alert and oriented x 3. CNII-XII intact. Mildly reduced strength of LUE and LLE. Normal strength on R side. Sensation reduced LUE and LLE. RLE reduced sensation but less than LLE. Normal reflexes throughout.   Lab results: Basic Metabolic Panel:  Recent Labs  16/02/9607/09/15 1630  NA 140  K 4.2  CL 104  CO2 26  GLUCOSE 117*  BUN 11  CREATININE 0.87  CALCIUM 8.6   Liver Function Tests:  Recent Labs  12/24/13 1630  AST 15  ALT 12  ALKPHOS 92  BILITOT 0.3  PROT 6.3  ALBUMIN 3.8   CBC:  Recent Labs  12/24/13 1630  WBC 6.7  NEUTROABS 2.9  HGB 13.8  HCT 41.6  MCV 92.0  PLT 240   Urine Drug Screen: Drugs of Abuse     Component Value Date/Time   LABOPIA NONE DETECTED 12/24/2013 1700   COCAINSCRNUR NONE DETECTED 12/24/2013 1700   LABBENZ NONE DETECTED 12/24/2013 1700   AMPHETMU NONE DETECTED 12/24/2013 1700   THCU NONE DETECTED 12/24/2013 1700   LABBARB NONE DETECTED 12/24/2013 1700     Urinalysis:  Recent Labs  12/24/13 1700  COLORURINE YELLOW  LABSPEC 1.028  PHURINE 6.5  GLUCOSEU NEGATIVE  HGBUR NEGATIVE  BILIRUBINUR NEGATIVE  KETONESUR NEGATIVE  PROTEINUR NEGATIVE  UROBILINOGEN 1.0  NITRITE NEGATIVE  LEUKOCYTESUR NEGATIVE    Imaging results:  Mike Lodema PilotBrain W Wo Contrast  12/24/2013   CLINICAL DATA:  28 year old male with numbness in the left fingers x1 week. Prior similar episode 5 months ago. Initial encounter.  EXAM: MRI HEAD WITHOUT AND WITH CONTRAST  MRI CERVICAL SPINE WITHOUT AND WITH CONTRAST  TECHNIQUE: Multiplanar, multiecho pulse sequences of the brain and surrounding structures,  and cervical spine, to include the craniocervical junction and cervicothoracic junction, were obtained without and with intravenous contrast.  CONTRAST:  15mL MULTIHANCE GADOBENATE DIMEGLUMINE 529 MG/ML IV SOLN  COMPARISON:  None.  FINDINGS: MRI HEAD FINDINGS  Multifocal nodular T2 and FLAIR hyperintense lesions in the cerebral white matter, those near the lateral ventricles oriented perpendicular. See series 12. Significant bilateral temporal lobe white matter involvement. Mild thinning of the anterior body of the corpus callosum.  No lesions are identified with restricted diffusion. No enhancing lesions are identified.  The deep gray matter nuclei, brainstem and cerebellum are spared. Major intracranial vascular flow voids are preserved.  No restricted diffusion to suggest acute infarction. No midline shift, mass effect, evidence of mass lesion, ventriculomegaly, extra-axial collection or acute intracranial hemorrhage. Cervicomedullary junction  and pituitary are within normal limits. No cortical encephalomalacia. Visible internal auditory structures appear normal. Visualized paranasal sinuses and mastoids are clear aside from minor mucosal thickening in the left maxillary sinus. Grossly normal orbits soft tissues. Visualized scalp soft tissues are within normal limits. Normal bone marrow signal.  MRI CERVICAL SPINE FINDINGS  Multiple T2 and STIR hyperintense lesions in the cervical spinal cord, including a fairly long segment involvement from the C3-C4 level to the C5-C6 level (series 1400 and 1900). The dorsal aspect of that plaque at the C5 level demonstrates subtle enhancement (series 22, image 27). There is a nonenhancing plaque extending from the C6-C7 level to the T1-T2 level. There is a subtle plaque in the upper cord at the C2-C3 level eccentric to the left.  Minimal cord expansion occurs with the larger plaques. Cord signal at the cervicomedullary junction and extending into the T3-T4 upper thoracic spine  levels appears within normal limits.  Mild straightening of cervical lordosis. No marrow edema or evidence of acute osseous abnormality. No significant cervical spine degenerative changes or spinal stenosis. Negative paraspinal soft tissues. No other abnormal enhancement.  IMPRESSION: Signal abnormality most compatible with multiple sclerosis affecting the brain and upper spinal cord. Evidence of acute demyelination in the spinal cord at the C5 level.   Electronically Signed   By: Augusto Gamble M.D.   On: 12/24/2013 19:58   Mike Cervical Spine W Wo Contrast  12/24/2013   CLINICAL DATA:  28 year old male with numbness in the left fingers x1 week. Prior similar episode 5 months ago. Initial encounter.  EXAM: MRI HEAD WITHOUT AND WITH CONTRAST  MRI CERVICAL SPINE WITHOUT AND WITH CONTRAST  TECHNIQUE: Multiplanar, multiecho pulse sequences of the brain and surrounding structures, and cervical spine, to include the craniocervical junction and cervicothoracic junction, were obtained without and with intravenous contrast.  CONTRAST:  15mL MULTIHANCE GADOBENATE DIMEGLUMINE 529 MG/ML IV SOLN  COMPARISON:  None.  FINDINGS: MRI HEAD FINDINGS  Multifocal nodular T2 and FLAIR hyperintense lesions in the cerebral white matter, those near the lateral ventricles oriented perpendicular. See series 12. Significant bilateral temporal lobe white matter involvement. Mild thinning of the anterior body of the corpus callosum.  No lesions are identified with restricted diffusion. No enhancing lesions are identified.  The deep gray matter nuclei, brainstem and cerebellum are spared. Major intracranial vascular flow voids are preserved.  No restricted diffusion to suggest acute infarction. No midline shift, mass effect, evidence of mass lesion, ventriculomegaly, extra-axial collection or acute intracranial hemorrhage. Cervicomedullary junction and pituitary are within normal limits. No cortical encephalomalacia. Visible internal auditory  structures appear normal. Visualized paranasal sinuses and mastoids are clear aside from minor mucosal thickening in the left maxillary sinus. Grossly normal orbits soft tissues. Visualized scalp soft tissues are within normal limits. Normal bone marrow signal.  MRI CERVICAL SPINE FINDINGS  Multiple T2 and STIR hyperintense lesions in the cervical spinal cord, including a fairly long segment involvement from the C3-C4 level to the C5-C6 level (series 1400 and 1900). The dorsal aspect of that plaque at the C5 level demonstrates subtle enhancement (series 22, image 27). There is a nonenhancing plaque extending from the C6-C7 level to the T1-T2 level. There is a subtle plaque in the upper cord at the C2-C3 level eccentric to the left.  Minimal cord expansion occurs with the larger plaques. Cord signal at the cervicomedullary junction and extending into the T3-T4 upper thoracic spine levels appears within normal limits.  Mild straightening of cervical lordosis.  No marrow edema or evidence of acute osseous abnormality. No significant cervical spine degenerative changes or spinal stenosis. Negative paraspinal soft tissues. No other abnormal enhancement.  IMPRESSION: Signal abnormality most compatible with multiple sclerosis affecting the brain and upper spinal cord. Evidence of acute demyelination in the spinal cord at the C5 level.   Electronically Signed   By: Augusto Gamble M.D.   On: 12/24/2013 19:58    Assessment & Plan by Problem: Principal Problem:   Multiple sclerosis  Left sided numbness and mild decreased strength, RLE numbness: CMP, CBC, UDS, UA unremarkable. No other signs of infection. No stroke in the setting of Mike brain and cervical spine demonstrating signal abnormality most compatible with multiple sclerosis with acute demyelination of the spinal cord at the C5 level. This is consistent with patient's clinical course given the relapsing-remitting nature of his symptoms in a young adult. Neurology  consulted by ED and appreciate recs. -Solumedrol 500mg  IV Q12hr for 6 doses, then prednisone taper -Protonix 40mg  daily -PT/OT  FEN: regular diet  DVT ppx: lovenox 40mg  daily  Dispo: Disposition is deferred at this time, awaiting improvement of current medical problems. Anticipated discharge in approximately 3 day(s).   The patient does not have a current PCP (No primary provider on file.) and does not need an [facility] hospital follow-up appointment after discharge.  The patient does not know have transportation limitations that hinder transportation to clinic appointments.  Signed: Griffin Basil, MD 12/24/2013, 10:27 PM

## 2013-12-25 DIAGNOSIS — G35 Multiple sclerosis: Principal | ICD-10-CM

## 2013-12-25 LAB — BASIC METABOLIC PANEL
Anion gap: 12 (ref 5–15)
BUN: 11 mg/dL (ref 6–23)
CALCIUM: 9.2 mg/dL (ref 8.4–10.5)
CO2: 24 meq/L (ref 19–32)
Chloride: 103 mEq/L (ref 96–112)
Creatinine, Ser: 0.84 mg/dL (ref 0.50–1.35)
GFR calc Af Amer: >90 mL/min (ref >90)
GFR calc non Af Amer: >90 mL/min (ref >90)
GLUCOSE: 113 mg/dL — AB (ref 70–99)
Potassium: 4.4 mEq/L (ref 3.7–5.3)
SODIUM: 139 meq/L (ref 137–147)

## 2013-12-25 LAB — CBC
HCT: 44.6 % (ref 39.0–52.0)
HEMOGLOBIN: 14.8 g/dL (ref 13.0–17.0)
MCH: 29.8 pg (ref 26.0–34.0)
MCHC: 33.2 g/dL (ref 30.0–36.0)
MCV: 89.7 fL (ref 78.0–100.0)
Platelets: 270 10*3/uL (ref 150–400)
RBC: 4.97 MIL/uL (ref 4.22–5.81)
RDW: 13.5 % (ref 11.5–15.5)
WBC: 7.9 10*3/uL (ref 4.0–10.5)

## 2013-12-25 LAB — HIV ANTIBODY (ROUTINE TESTING W REFLEX): HIV: NONREACTIVE

## 2013-12-25 NOTE — H&P (Signed)
Internal Medicine Attending Admission Note Date: 12/25/2013  Patient name: Mike Lowery Medical record number: [id number] Date of birth: March 28, 1986 Age: 28 y.o. Gender: male  I saw and evaluated the patient. I reviewed the resident's note and I agree with the resident's findings and plan as documented in the resident's note, with the following additional comments.  Chief Complaint(s): Left-sided numbness; mild left-sided weakness; right lower extremity numbness  History - key components related to admission: Patient is a 28 year old man with no significant past medical history presented with complaints of left sided numbness with associated mild left-sided weakness and with some recent right lower extremity numbness.  Symptoms began about one week ago.  He reports a similar but milder episode about 5 months ago which resolved and for which he did not seek medical care.  Physical Exam - key components related to admission:  Filed Vitals:   12/24/13 2131 12/25/13 0233 12/25/13 0648 12/25/13 1023  BP: 123/58 107/51 120/53 113/48  Pulse: 68 59 64 73  Temp: 98 F (36.7 C) 98.4 F (36.9 C) 97.7 F (36.5 C) 98.2 F (36.8 C)  TempSrc: Oral Oral Oral Oral  Resp: 16 16 18 18   Height: 5\' 7"  (1.702 m)     Weight: 163 lb 14.4 oz (74.345 kg)     SpO2: 100% 100% 99%     General: Alert, oriented, no distress Lungs: Clear Heart: Regular; no extra sounds or murmurs Abdomen: Bowel sounds present, soft, nontender Extremities: No edema Neurologic: Decreased sensation left upper and left lower extremity to light; mild left hand grip weakness  Lab results:   Basic Metabolic Panel:  Recent Labs  54/49/20 1630 12/25/13 0422  NA 140 139  K 4.2 4.4  CL 104 103  CO2 26 24  GLUCOSE 117* 113*  BUN 11 11  CREATININE 0.87 0.84  CALCIUM 8.6 9.2    Liver Function Tests:  Recent Labs  12/24/13 1630  AST 15  ALT 12  ALKPHOS 92  BILITOT 0.3  PROT 6.3  ALBUMIN 3.8     CBC:  Recent  Labs  12/24/13 1630 12/25/13 0422  WBC 6.7 7.9  HGB 13.8 14.8  HCT 41.6 44.6  MCV 92.0 89.7  PLT 240 270    Recent Labs  12/24/13 1630  NEUTROABS 2.9  LYMPHSABS 2.9  MONOABS 0.5  EOSABS 0.3  BASOSABS 0.0     Urine Drug Screen: Drugs of Abuse     Component Value Date/Time   LABOPIA NONE DETECTED 12/24/2013 1700   COCAINSCRNUR NONE DETECTED 12/24/2013 1700   LABBENZ NONE DETECTED 12/24/2013 1700   AMPHETMU NONE DETECTED 12/24/2013 1700   THCU NONE DETECTED 12/24/2013 1700   LABBARB NONE DETECTED 12/24/2013 1700      Urinalysis    Component Value Date/Time   COLORURINE YELLOW 12/24/2013 1700   APPEARANCEUR CLEAR 12/24/2013 1700   LABSPEC 1.028 12/24/2013 1700   PHURINE 6.5 12/24/2013 1700   GLUCOSEU NEGATIVE 12/24/2013 1700   HGBUR NEGATIVE 12/24/2013 1700   BILIRUBINUR NEGATIVE 12/24/2013 1700   KETONESUR NEGATIVE 12/24/2013 1700   PROTEINUR NEGATIVE 12/24/2013 1700   UROBILINOGEN 1.0 12/24/2013 1700   NITRITE NEGATIVE 12/24/2013 1700   LEUKOCYTESUR NEGATIVE 12/24/2013 1700    Imaging results:  Mr Laqueta Jean Wo Contrast  12/24/2013   CLINICAL DATA:  28 year old male with numbness in the left fingers x1 week. Prior similar episode 5 months ago. Initial encounter.  EXAM: MRI HEAD WITHOUT AND WITH CONTRAST  MRI CERVICAL SPINE WITHOUT AND WITH  CONTRAST  TECHNIQUE: Multiplanar, multiecho pulse sequences of the brain and surrounding structures, and cervical spine, to include the craniocervical junction and cervicothoracic junction, were obtained without and with intravenous contrast.  CONTRAST:  15mL MULTIHANCE GADOBENATE DIMEGLUMINE 529 MG/ML IV SOLN  COMPARISON:  None.  FINDINGS: MRI HEAD FINDINGS  Multifocal nodular T2 and FLAIR hyperintense lesions in the cerebral white matter, those near the lateral ventricles oriented perpendicular. See series 12. Significant bilateral temporal lobe white matter involvement. Mild thinning of the anterior body of the corpus callosum.  No lesions are identified with  restricted diffusion. No enhancing lesions are identified.  The deep gray matter nuclei, brainstem and cerebellum are spared. Major intracranial vascular flow voids are preserved.  No restricted diffusion to suggest acute infarction. No midline shift, mass effect, evidence of mass lesion, ventriculomegaly, extra-axial collection or acute intracranial hemorrhage. Cervicomedullary junction and pituitary are within normal limits. No cortical encephalomalacia. Visible internal auditory structures appear normal. Visualized paranasal sinuses and mastoids are clear aside from minor mucosal thickening in the left maxillary sinus. Grossly normal orbits soft tissues. Visualized scalp soft tissues are within normal limits. Normal bone marrow signal.  MRI CERVICAL SPINE FINDINGS  Multiple T2 and STIR hyperintense lesions in the cervical spinal cord, including a fairly long segment involvement from the C3-C4 level to the C5-C6 level (series 1400 and 1900). The dorsal aspect of that plaque at the C5 level demonstrates subtle enhancement (series 22, image 27). There is a nonenhancing plaque extending from the C6-C7 level to the T1-T2 level. There is a subtle plaque in the upper cord at the C2-C3 level eccentric to the left.  Minimal cord expansion occurs with the larger plaques. Cord signal at the cervicomedullary junction and extending into the T3-T4 upper thoracic spine levels appears within normal limits.  Mild straightening of cervical lordosis. No marrow edema or evidence of acute osseous abnormality. No significant cervical spine degenerative changes or spinal stenosis. Negative paraspinal soft tissues. No other abnormal enhancement.  IMPRESSION: Signal abnormality most compatible with multiple sclerosis affecting the brain and upper spinal cord. Evidence of acute demyelination in the spinal cord at the C5 level.   Electronically Signed   By: Augusto GambleLee  Hall M.D.   On: 12/24/2013 19:58   Mr Cervical Spine W Wo  Contrast  12/24/2013   CLINICAL DATA:  28 year old male with numbness in the left fingers x1 week. Prior similar episode 5 months ago. Initial encounter.  EXAM: MRI HEAD WITHOUT AND WITH CONTRAST  MRI CERVICAL SPINE WITHOUT AND WITH CONTRAST  TECHNIQUE: Multiplanar, multiecho pulse sequences of the brain and surrounding structures, and cervical spine, to include the craniocervical junction and cervicothoracic junction, were obtained without and with intravenous contrast.  CONTRAST:  15mL MULTIHANCE GADOBENATE DIMEGLUMINE 529 MG/ML IV SOLN  COMPARISON:  None.  FINDINGS: MRI HEAD FINDINGS  Multifocal nodular T2 and FLAIR hyperintense lesions in the cerebral white matter, those near the lateral ventricles oriented perpendicular. See series 12. Significant bilateral temporal lobe white matter involvement. Mild thinning of the anterior body of the corpus callosum.  No lesions are identified with restricted diffusion. No enhancing lesions are identified.  The deep gray matter nuclei, brainstem and cerebellum are spared. Major intracranial vascular flow voids are preserved.  No restricted diffusion to suggest acute infarction. No midline shift, mass effect, evidence of mass lesion, ventriculomegaly, extra-axial collection or acute intracranial hemorrhage. Cervicomedullary junction and pituitary are within normal limits. No cortical encephalomalacia. Visible internal auditory structures appear normal. Visualized paranasal sinuses  and mastoids are clear aside from minor mucosal thickening in the left maxillary sinus. Grossly normal orbits soft tissues. Visualized scalp soft tissues are within normal limits. Normal bone marrow signal.  MRI CERVICAL SPINE FINDINGS  Multiple T2 and STIR hyperintense lesions in the cervical spinal cord, including a fairly long segment involvement from the C3-C4 level to the C5-C6 level (series 1400 and 1900). The dorsal aspect of that plaque at the C5 level demonstrates subtle enhancement  (series 22, image 27). There is a nonenhancing plaque extending from the C6-C7 level to the T1-T2 level. There is a subtle plaque in the upper cord at the C2-C3 level eccentric to the left.  Minimal cord expansion occurs with the larger plaques. Cord signal at the cervicomedullary junction and extending into the T3-T4 upper thoracic spine levels appears within normal limits.  Mild straightening of cervical lordosis. No marrow edema or evidence of acute osseous abnormality. No significant cervical spine degenerative changes or spinal stenosis. Negative paraspinal soft tissues. No other abnormal enhancement.  IMPRESSION: Signal abnormality most compatible with multiple sclerosis affecting the brain and upper spinal cord. Evidence of acute demyelination in the spinal cord at the C5 level.   Electronically Signed   By: Augusto Gamble M.D.   On: 12/24/2013 19:58    Other results: EKG: Sinus tachycardia; LVH by voltage; ST elev, probable normal early repol pattern  Assessment & Plan by Problem:  1.  Multiple sclerosis.  This is a new diagnosis; per discussion with neurology, patient's clinical history and MRI findings establish the diagnosis of multiple sclerosis.  Plan is IV Solu-Medrol as per their protocol, with transition to prednisone taper; follow neuro exam; OT/PT consult; he will need close outpatient followup and management by outpatient neurology.  2.  Social.  As above, patient is uninsured and will need close outpatient followup and specialty care.  Plan is case management/social work consult to assist with the available resources.  3.  Other problems and plans as per the resident physician's note.

## 2013-12-25 NOTE — Progress Notes (Signed)
Subjective: No significant change.   Exam: Filed Vitals:   12/25/13 1023  BP: 113/48  Pulse: 73  Temp: 98.2 F (36.8 C)  Resp: 18   Gen: In bed, NAD MS: Awake, alert, interactive and appropriate.  YH:NPMVA, EOMI Motor: 5/5 throughout Sensory:intact to LT   Impression: [name] with multiple lesions with dissemination in both time and space. History of previous sensory relapse 4 -5  Months ago. His MRI shows spine > brain involvement, but there is brain involvement as well. No history of optic neuritis. This imaging/presentation is very consistent with MS and McDonald Criteria are met in his case with dissemination in time and space by history and by MRI with single enhancing lesion with multiple other non-enhancing lesions.   Recommendations: 1) NMO 2) continue steriods as outlined in Dr. Les Pou previous note.  3) OT, PT  Roland Rack, MD [facility] [phone]  If 7pm- 7am, please page neurology on call as listed in [place].

## 2013-12-25 NOTE — Evaluation (Signed)
Physical Therapy Evaluation/Discharge Patient Details Name: Mike Lowery MRN: [id number] DOB: [birth year] Today's Date: 12/25/2013   History of Present Illness  28 y.o. male admitted to [facility] on 12/24/13 with left sided numbness and tingling.  New Dx this admission of MS (relapsing-remitting)  Clinical Impression  Pt is independent with all mobility.  Strength equal in his legs.  Still has some numbness and burning left leg greater than right leg.  Balance is WNL.  No further acute PT needed.  Reinforced not overheating and grading his activity based on fatigue levels.  PT will sign off.     Follow Up Recommendations No PT follow up    Equipment Recommendations  None recommended by PT    Recommendations for Other Services   NA    Precautions / Restrictions Precautions Precautions: None Restrictions Weight Bearing Restrictions: No      Mobility  Bed Mobility Overal bed mobility: Independent                Transfers Overall transfer level: Independent Equipment used: None                Ambulation/Gait Ambulation/Gait assistance: Independent Ambulation Distance (Feet): 510 Feet Assistive device: None Gait Pattern/deviations: WFL(Within Functional Limits)   Gait velocity interpretation: at or above normal speed for age/gender    Stairs Stairs: Yes Stairs assistance: Independent Stair Management: No rails;Alternating pattern;Forwards Number of Stairs: 9 General stair comments: pt quickly joggging up the stairs, reciprocally          Balance                                 Standardized Balance Assessment Standardized Balance Assessment : Dynamic Gait Index   Dynamic Gait Index Level Surface: Normal Change in Gait Speed: Normal Gait with Horizontal Head Turns: Normal Gait with Vertical Head Turns: Normal Gait and Pivot Turn: Normal Step Over Obstacle: Normal Step Around Obstacles: Normal Steps: Normal Total Score: 24        Pertinent Vitals/Pain Pain Assessment: No/denies pain Pain Score: 0-No pain Pain Location: left hand and left foot Pain Descriptors / Indicators: Burning Pain Intervention(s): Monitored during session;Repositioned    Home Living Family/patient expects to be discharged to:: Private residence Living Arrangements: Alone Available Help at Discharge: Family;Available 24 hours/day (father works but not right now) Type of Home: House Home Access: Stairs to enter Entrance Stairs-Rails: Right Entrance Stairs-Number of Steps: 10 Home Layout: One level Home Equipment: None Additional Comments: Pt plays basketball 3-4 times per week and also works out lifting weights.     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, don't drive     Hand Dominance   Dominant Hand: Right    Extremity/Trunk Assessment   Upper Extremity Assessment: Overall WFL for tasks assessed (mild decreased strength in left hand compared to right)           Lower Extremity Assessment: Overall WFL for tasks assessed      Cervical / Trunk Assessment: Normal  Communication   Communication: No difficulties  Cognition Arousal/Alertness: Awake/alert Behavior During Therapy: WFL for tasks assessed/performed Overall Cognitive Status: Within Functional Limits for tasks assessed                      General Comments General comments (skin integrity, edema, etc.): Educated pt  re: avoiding overheating, more frequent rest breaks when working out, and not to           Assessment/Plan    PT Assessment Patent does not need any further PT services           PT Goals (Current goals can be found in the Care Plan section) Acute Rehab PT Goals Patient Stated Goal: find out more about MS PT Goal Formulation: No goals set, d/c therapy     End of Session Equipment Utilized During Treatment: Gait belt Activity Tolerance: Patient tolerated  treatment well Patient left: in chair           Time: [id number] PT Time Calculation (min): 19 min   Charges:   PT Evaluation $Initial PT Evaluation Tier I: 1 Procedure          Linea Calles B. Auriella Wieand, PT, DPT [phone]   12/25/2013, 2:55 PM

## 2013-12-25 NOTE — Progress Notes (Signed)
[facility]    SUBJECTIVE:     Mike Lowery is a 28 year old male with DM II, HTN, hypothyroidism and Hx of aortocoronary bypass graft who presents to clinic for F/U.     HPI:   1. HTN   Amlodipine (5 MG) , losartan (50 MG) , metoprolol (50 MG) daily   Blood pressure normally 130/70 at home. Pt reports occ L side chest pain at rest (5 out of 10 ) for 12 years.  Does not use nitroglycerin because says pain is not that bad. No headaches, no palpitations.     2. DM   Insulin 30 units at night, Metformin BID, Jardiance 1x day   No increase thirst , dry mouth, no blurred vision , headaches   Blood sugar fasting 110    Blood sugar post- prandial 180-200     Patient Active Problem List   Diagnosis   . Atherosclerosis of coronary artery   . Hypercholesterolemia   . Essential hypertension   . Type 2 diabetes mellitus with microalbuminuria, with long-term current use of insulin (CMS-HCC)   . Status post coronary artery bypass graft   . Healthcare maintenance   . Acquired hypothyroidism   . Microcytic anemia   . Adenomatous polyp of colon, unspecified part of colon   . Benign prostatic hyperplasia with weak urinary stream   . Chronic bilateral low back pain without sciatica   . Keloid   . Chronic pain of both knees       No past surgical history on file.     No family history on file.    Social History     Socioeconomic History   . Marital status: Married   Tobacco Use   . Smoking status: Never   . Smokeless tobacco: Never   Substance and Sexual Activity   . Alcohol use: No   . Drug use: No       Current Outpatient Medications   Medication Instructions   . amLODIPINE (NORVASC) 5 mg, Oral, DAILY   . atorvastatin (LIPITOR) 40 mg, Oral, AT BEDTIME   . BD VEO INSULIN SYRINGE U/F 31G X 15/64" 1 ML MISC USE 1 SYRINGE 4 TIMES DAILY BEFORE MEAL(S) AND AT NIGHT   . Blood Glucose Monitoring Suppl (TRUETRACK BLOOD GLUCOSE) w/Device KIT Please give blood glucose machine with test strips and  lancets   . EQ ASPIRIN ADULT LOW DOSE 81 MG EC tablet Take 1 tablet by mouth once daily   . ferrous sulfate 325 (65 Fe) MG tablet Take 1 tablet by mouth twice daily   . glucose blood test strip 1 strip, Other, 3 TIMES DAILY BEFORE MEALS   . Insulin Pen Needle (PEN NEEDLES 3/16") 31G X 5 MM MISC Use one pen needle with each insulin administration.   . JARDIANCE 10 MG tablet Take 1 tablet by mouth once daily   . lancets 1 each, Other, 3 TIMES DAILY BEFORE MEALS   . LANTUS SOLOSTAR 100 UNIT/ML SOPN INJECT 30 UNITS SUBCUTANEOUSLY AT BEDTIME   . levothyroxine (SYNTHROID) 50 mcg, Oral, DAILY BEFORE BREAKFAST   . losartan (COZAAR) 50 mg, Oral, DAILY   . metFORMIN (GLUCOPHAGE) 1000 mg tablet TAKE 1 TABLET BY MOUTH TWICE DAILY WITH MEALS   . metoprolol succinate (TOPROL XL) 50 mg, Oral, DAILY   . nitroGLYcerin (NITROSTAT) 0.4 mg, Sublingual, EVERY 5 MIN PRN   . omeprazole (PRILOSEC) 20 mg, Oral, DAILY   .   potassium citrate (UROCIT-K) 10 MEQ (1080 MG) Sustained-Release tablet 10 mEq, Oral, 3 TIMES DAILY   . tamsulosin (FLOMAX) 0.4 mg, Oral, DAILY   . U-100 1 mL U-100 1 ML syringe 1 Syringe, Other, 4 TIMES DAILY BEFORE MEALS & NIGHTLY       No Known Allergies    OBJECTIVE:   BP 152/68   Pulse 72   Temp 98 F (36.7 C) (Temporal)   Resp 18   Ht 5' 10" (1.778 m)   Wt 70.5 kg (155 lb 7.1 oz)   BMI 22.30 kg/m   Chaperone: ***  General: No acute distress.  Heart: RRR, no murmurs   Chest: CTAB, no rales, ronchi   Extremities: No peripheral edema      Labs and Imaging:      ASSESSMENT & PLAN:     1. Stable Angina Pectoris   # {Healthcare Maintenance}  # {Body mass index is 22.3 kg/m.}  # {Nutrition and Exercise Counseling}  {Interventions done today to address healthcare maintenance, e.g. vaccines, cancer screening, screening tests}  - {Intervention #1}  - {Intervention #2}    Follow Up: No follow-ups on file.    Discussed the importance of monitoring labs as well as medication compliance and the risks of significant  morbidity and mortality if not done  Patient was instructed to call the clinic if has not heard back in regards to diagnostic tests within 1 week.  Discussed healthy diet and exercise  Patient was given strict return precautions should symptoms worsen or fail to improve for any concerns.    Alexis Pellecer, MS-3   [employer]

## 2013-12-25 NOTE — Evaluation (Signed)
Occupational Therapy Evaluation and Discharge Patient Details Name: Mike Lowery MRN: [id number] DOB: 10/19/1985 Today's Date: 12/25/2013    History of Present Illness 28 y.o. male admitted to [facility] on 12/24/13 with left sided numbness and tingling.  New Dx this admission of MS (relapsing-remitting)   Clinical Impression   This 27 yo admitted and found to have new diagnosis of above presents to acute OT at an independent level. No further OT needs, we will sign off.    Follow Up Recommendations  No OT follow up    Equipment Recommendations  None recommended by OT       Precautions / Restrictions Precautions Precautions: None Restrictions Weight Bearing Restrictions: No      Mobility Bed Mobility Overal bed mobility: Independent                Transfers Overall transfer level: Independent                         ADL Overall ADL's : Independent                                       General ADL Comments: Pt/family asking alot of questions about MS, some I answered and some I deferred to his neurologist. I did give them the number of the local MS chapter that can give them more information     Vision Eye Alignment: Within Functional Limits Alignment/Gaze Preference: Within Defined Limits Ocular Range of Motion: Within Functional Limits Tracking/Visual Pursuits: Able to track stimulus in all quads without difficulty Saccades: Within functional limits Convergence: Within functional limits                Pertinent Vitals/Pain Pain Assessment: No/denies pain Pain Score: 0-No pain     Hand Dominance Right   Extremity/Trunk Assessment Upper Extremity Assessment Upper Extremity Assessment: Overall WFL for tasks assessed (mild decreased strength in left hand compared to right)           Communication Communication Communication: No difficulties   Cognition Arousal/Alertness: Awake/alert Behavior During Therapy: WFL for tasks  assessed/performed Overall Cognitive Status: Within Functional Limits for tasks assessed                        Exercises   Other Exercises Other Exercises: Issued pt a hand exerciser (15# to start) and theraputty (red)--with handout for exercise squence.     Home Living Family/patient expects to be discharged to:: Private residence Living Arrangements: Alone Available Help at Discharge: Family;Available 24 hours/day (father works but not right now) Type of Home: House Home Access: Stairs to enter Secretary/administrator of Steps: 10 Entrance Stairs-Rails: Right Home Layout: One level     Bathroom Shower/Tub: Chief Strategy Officer: Standard     Home Equipment: None   Additional Comments: Pt plays basketball 3-4 times per week and also works out [employer].       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, don't drive             OT Goals(Current goals can be found in the care plan section) Acute Rehab OT Goals Patient Stated Goal: find out more about MS  OT Frequency:  End of Session Equipment Utilized During Treatment:  (none) Nurse Communication:  (Pt wanting to go outside with family--OK'd (pt out in W/C, family to notify nurse when they come back))  Activity Tolerance: Patient tolerated treatment well Patient left:  (sitting EOB)   Time: [id number] OT Time Calculation (min): 20 min Charges:  OT General Charges $OT Visit: 1 Procedure OT Treatments $Therapeutic Exercise: 8-22 mins  Evette GeorgesLeonard, Essica Kiker Eva [phone] 12/25/2013, 4:13 PM

## 2013-12-25 NOTE — Progress Notes (Signed)
OT Cancellation Note  Patient Details Name: Mike Lowery MRN: [id number] DOB: 09/10/1985   Cancelled Treatment:    Reason Eval/Treat Not Completed: Other (comment). Pt sleeping soundly, will re-attempt eval later hopefully this AM, if not then early PM.  Evette Georges  [phone] 12/25/2013, 9:04 AM

## 2013-12-25 NOTE — Progress Notes (Signed)
Subjective: Patient continues to endorse weakness and numbness in his left upper and lower extremities. He also notes abnormal sensation in his right hand and right lower extremity up to his mid-pelvis. This sensation causes some discomfort that did not improve with gabapentin. Wife was asking what his prognosis is and what treatments there are for him, since he is uninsured. Denies changes in vision or mental status changes.   Objective: Vital signs in last 24 hours: Filed Vitals:   12/24/13 2131 12/25/13 0233 12/25/13 0648 12/25/13 1023  BP: 123/58 107/51 120/53 113/48  Pulse: 68 59 64 73  Temp: 98 F (36.7 C) 98.4 F (36.9 C) 97.7 F (36.5 C) 98.2 F (36.8 C)  TempSrc: Oral Oral Oral Oral  Resp: 16 16 18 18   Height: 5\' 7"  (1.702 m)     Weight: 74.345 kg (163 lb 14.4 oz)     SpO2: 100% 100% 99%    Weight change:   No intake or output data in the 24 hours ending 12/25/13 1145 BP 113/48  Pulse 73  Temp(Src) 98.2 F (36.8 C) (Oral)  Resp 18  Ht 5\' 7"  (1.702 m)  Wt 74.345 kg (163 lb 14.4 oz)  BMI 25.66 kg/m2  SpO2 99% General appearance: alert, cooperative and no distress Lungs: clear to auscultation bilaterally Heart: regular rate and rhythm, S1, S2 normal, no murmur, click, rub or gallop Extremities: extremities normal, atraumatic, no cyanosis or edema Neurologic: Mental status: Alert, oriented, thought content appropriate Cranial nerve: II-XII intact, no focal deficits appreciated, PEARL Sensory: abnormal sensation in LUE to shoulder, RUE to wrist, LLE and RLE to mid-pelvis. He has reduced sensation from C4 all the way down to his LE.  Motor: Appears to have 5-/5 in with left hand grip and LLE at thigh flexion.   Reflexes: 2+ and symmetric throughout  Lab Results: Basic Metabolic Panel:  Recent Labs Lab 12/24/13 1630 12/25/13 0422  NA 140 139  K 4.2 4.4  CL 104 103  CO2 26 24  GLUCOSE 117* 113*  BUN 11 11  CREATININE 0.87 0.84  CALCIUM 8.6 9.2   Liver  Function Tests:  Recent Labs Lab 12/24/13 1630  AST 15  ALT 12  ALKPHOS 92  BILITOT 0.3  PROT 6.3  ALBUMIN 3.8   CBC:  Recent Labs Lab 12/24/13 1630 12/25/13 0422  WBC 6.7 7.9  NEUTROABS 2.9  --   HGB 13.8 14.8  HCT 41.6 44.6  MCV 92.0 89.7  PLT 240 270   Urine Drug Screen: Drugs of Abuse     Component Value Date/Time   LABOPIA NONE DETECTED 12/24/2013 1700   COCAINSCRNUR NONE DETECTED 12/24/2013 1700   LABBENZ NONE DETECTED 12/24/2013 1700   AMPHETMU NONE DETECTED 12/24/2013 1700   THCU NONE DETECTED 12/24/2013 1700   LABBARB NONE DETECTED 12/24/2013 1700    Urinalysis:  Recent Labs Lab 12/24/13 1700  COLORURINE YELLOW  LABSPEC 1.028  PHURINE 6.5  GLUCOSEU NEGATIVE  HGBUR NEGATIVE  BILIRUBINUR NEGATIVE  KETONESUR NEGATIVE  PROTEINUR NEGATIVE  UROBILINOGEN 1.0  NITRITE NEGATIVE  LEUKOCYTESUR NEGATIVE   Studies/Results: Mr Lodema Pilot Contrast  12/24/2013   CLINICAL DATA:  28 year old male with numbness in the left fingers x1 week. Prior similar episode 5 months ago. Initial encounter.  EXAM: MRI HEAD WITHOUT AND WITH CONTRAST  MRI CERVICAL SPINE WITHOUT AND WITH CONTRAST  TECHNIQUE: Multiplanar, multiecho pulse sequences of the brain and surrounding structures, and cervical spine, to include the craniocervical junction and cervicothoracic junction, were  obtained without and with intravenous contrast.  CONTRAST:  15mL MULTIHANCE GADOBENATE DIMEGLUMINE 529 MG/ML IV SOLN  COMPARISON:  None.  FINDINGS: MRI HEAD FINDINGS  Multifocal nodular T2 and FLAIR hyperintense lesions in the cerebral white matter, those near the lateral ventricles oriented perpendicular. See series 12. Significant bilateral temporal lobe white matter involvement. Mild thinning of the anterior body of the corpus callosum.  No lesions are identified with restricted diffusion. No enhancing lesions are identified.  The deep gray matter nuclei, brainstem and cerebellum are spared. Major intracranial vascular flow  voids are preserved.  No restricted diffusion to suggest acute infarction. No midline shift, mass effect, evidence of mass lesion, ventriculomegaly, extra-axial collection or acute intracranial hemorrhage. Cervicomedullary junction and pituitary are within normal limits. No cortical encephalomalacia. Visible internal auditory structures appear normal. Visualized paranasal sinuses and mastoids are clear aside from minor mucosal thickening in the left maxillary sinus. Grossly normal orbits soft tissues. Visualized scalp soft tissues are within normal limits. Normal bone marrow signal.  MRI CERVICAL SPINE FINDINGS  Multiple T2 and STIR hyperintense lesions in the cervical spinal cord, including a fairly long segment involvement from the C3-C4 level to the C5-C6 level (series 1400 and 1900). The dorsal aspect of that plaque at the C5 level demonstrates subtle enhancement (series 22, image 27). There is a nonenhancing plaque extending from the C6-C7 level to the T1-T2 level. There is a subtle plaque in the upper cord at the C2-C3 level eccentric to the left.  Minimal cord expansion occurs with the larger plaques. Cord signal at the cervicomedullary junction and extending into the T3-T4 upper thoracic spine levels appears within normal limits.  Mild straightening of cervical lordosis. No marrow edema or evidence of acute osseous abnormality. No significant cervical spine degenerative changes or spinal stenosis. Negative paraspinal soft tissues. No other abnormal enhancement.  IMPRESSION: Signal abnormality most compatible with multiple sclerosis affecting the brain and upper spinal cord. Evidence of acute demyelination in the spinal cord at the C5 level.   Electronically Signed   By: Augusto GambleLee  Hall M.D.   On: 12/24/2013 19:58   Mr Cervical Spine W Wo Contrast  12/24/2013   CLINICAL DATA:  28 year old male with numbness in the left fingers x1 week. Prior similar episode 5 months ago. Initial encounter.  EXAM: MRI HEAD WITHOUT  AND WITH CONTRAST  MRI CERVICAL SPINE WITHOUT AND WITH CONTRAST  TECHNIQUE: Multiplanar, multiecho pulse sequences of the brain and surrounding structures, and cervical spine, to include the craniocervical junction and cervicothoracic junction, were obtained without and with intravenous contrast.  CONTRAST:  15mL MULTIHANCE GADOBENATE DIMEGLUMINE 529 MG/ML IV SOLN  COMPARISON:  None.  FINDINGS: MRI HEAD FINDINGS  Multifocal nodular T2 and FLAIR hyperintense lesions in the cerebral white matter, those near the lateral ventricles oriented perpendicular. See series 12. Significant bilateral temporal lobe white matter involvement. Mild thinning of the anterior body of the corpus callosum.  No lesions are identified with restricted diffusion. No enhancing lesions are identified.  The deep gray matter nuclei, brainstem and cerebellum are spared. Major intracranial vascular flow voids are preserved.  No restricted diffusion to suggest acute infarction. No midline shift, mass effect, evidence of mass lesion, ventriculomegaly, extra-axial collection or acute intracranial hemorrhage. Cervicomedullary junction and pituitary are within normal limits. No cortical encephalomalacia. Visible internal auditory structures appear normal. Visualized paranasal sinuses and mastoids are clear aside from minor mucosal thickening in the left maxillary sinus. Grossly normal orbits soft tissues. Visualized scalp soft tissues are within  normal limits. Normal bone marrow signal.  MRI CERVICAL SPINE FINDINGS  Multiple T2 and STIR hyperintense lesions in the cervical spinal cord, including a fairly long segment involvement from the C3-C4 level to the C5-C6 level (series 1400 and 1900). The dorsal aspect of that plaque at the C5 level demonstrates subtle enhancement (series 22, image 27). There is a nonenhancing plaque extending from the C6-C7 level to the T1-T2 level. There is a subtle plaque in the upper cord at the C2-C3 level eccentric to the  left.  Minimal cord expansion occurs with the larger plaques. Cord signal at the cervicomedullary junction and extending into the T3-T4 upper thoracic spine levels appears within normal limits.  Mild straightening of cervical lordosis. No marrow edema or evidence of acute osseous abnormality. No significant cervical spine degenerative changes or spinal stenosis. Negative paraspinal soft tissues. No other abnormal enhancement.  IMPRESSION: Signal abnormality most compatible with multiple sclerosis affecting the brain and upper spinal cord. Evidence of acute demyelination in the spinal cord at the C5 level.   Electronically Signed   By: Augusto Gamble M.D.   On: 12/24/2013 19:58   Medications:  Scheduled Meds: . enoxaparin (LOVENOX) injection  40 mg Subcutaneous Q24H  . gabapentin  100 mg Oral TID  . methylPREDNISolone (SOLU-MEDROL) injection  500 mg Intravenous Q12H  . nicotine  14 mg Transdermal QHS  . pantoprazole  40 mg Oral Daily   Continuous Infusions:  PRN Meds:.acetaminophen, acetaminophen, ondansetron (ZOFRAN) IV, ondansetron  Assessment/Plan: Principal Problem:   Multiple sclerosis  Multiple Sclerosis: Diagnosed based on clinical presentation physical examination findings. MRI is very classic for multiple sclerosis. No signs of infection and we do not suspect meningitis. Acute stroke. Has been excluded based on brain imaging. However other differentials including nutritional deficiencies like, thiamine, and vitamin B12 cannot be excluded at this time. MR brain and cervical spine demonstrating signal abnormality most compatible with multiple sclerosis with acute demyelination of the spinal cord at the C5 level.  Plan  - Neurology consult, appreciate recs - Solumedrol 500mg  IV Q12hr for 6 doses, then prednisone taper  - gabapentin 100 mg 3 times a day - Protonix 40mg  daily  - PT/OT consult - patient has no health insurance, which is likely to complicate his outpatient follow. He has no PCP  in the community.  We will set him up with the internal medicine Center for his hospital followup. We will also consult case management to assist with financial resources. - Patient and his family had questions regarding his prognosis. This will be deferred to the neurologist.  FEN: regular diet   DVT ppx: lovenox 40mg  daily   This is a Psychologist, occupational Note.  The care of the patient was discussed with Dr. Zada Girt and the assessment and plan formulated with their assistance.  Please see their attached note for official documentation of the daily encounter.   LOS: 1 day   Foye Clock, Med Student 12/25/2013, 11:45 AM   I have seen the patient and reviewed the daily progress note by Aliene Altes MS IV and discussed the care of the patient with them.  My findings, assessment, and plans/additions have been embedded by editing in the blue print.   Signed:  Dow Adolph, MD PGY-3 Internal Medicine Teaching Service Pager: [phone] 12/25/2013, 12:46 PM

## 2013-12-25 NOTE — Progress Notes (Signed)
CARE MANAGEMENT NOTE 12/25/2013  Patient:  Mike Lowery, Mike Lowery   Account Number:  [id number]  Date Initiated:  12/25/2013  Documentation initiated by:  Jiles Crocker  Subjective/Objective Assessment:   ADMITTED WITH NUMBNESS/MS     Action/Plan:   CM WILL CONTINUE TO FOLLOW FOR DCP   Anticipated DC Date:  12/30/2013   Anticipated DC Plan:  HOME/SELF CARE  In-house referral  Financial Counselor      DC Planning Services  CM consult          Status of service:  In process, will continue to follow Medicare Important Message given?   (If response is "NO", the following Medicare IM given date fields will be blank)  Per UR Regulation:  Reviewed for med. necessity/level of care/duration of stay  Comments:  8/10/2015Abelino Derrick RN,BSN,MHA [phone]

## 2013-12-26 NOTE — Progress Notes (Signed)
Subjective: No significant change.   Exam: Filed Vitals:   12/26/13 0555  BP: 96/41  Pulse: 75  Temp: 97.8 F (36.6 C)  Resp: 18   Gen: In bed, NAD MS: Awake, alert, interactive and appropriate.  SH:UOHFG, EOMI Motor: 5/5 throughout Sensory:intact to LT   Impression: [name] with multiple lesions with dissemination in both time and space. History of previous sensory relapse 4 -5  Months ago. His MRI shows spine > brain involvement, but there is brain involvement as well. No history of optic neuritis. This imaging/presentation is very consistent with MS and McDonald Criteria are met in his case with dissemination in time and space by history and by MRI with single enhancing lesion with multiple other non-enhancing lesions.   Recommendations: 1) NMO pending.  2) continue steriods for total 6 doses 560m IV solumedrol 3) OT, PT  MRoland Rack MD [facility] [phone] If 7pm- 7am, please page neurology on call as listed in [place]

## 2013-12-26 NOTE — Progress Notes (Signed)
Internal Medicine Attending  Date: 12/26/2013  Patient name: Mike Lowery Medical record number: [id number] Date of birth: 1986-03-11 Age: 28 y.o. Gender: male  I saw and evaluated the patient, and discussed his care with resident on A.M rounds.  Patient has no new complaints.  He reports improvement in his numbness; he has no weakness, and strength is intact.  Plan is continue IV steroids as per neurology.  We will need assistance from case management to identify resources since patient is uninsured.

## 2013-12-26 NOTE — Progress Notes (Signed)
Subjective: Overall, patient feels like his symptoms are improving. He reports resolution of sensation changes in his right and left upper extremities, and improvement in sensation of his right lower extremity to the distal thigh. He continues to endorse paresthesias. Denies changes in vision and incontinence.  Objective: Vital signs in last 24 hours: Filed Vitals:   12/25/13 1023 12/25/13 1408 12/25/13 1800 12/26/13 0555  BP: 113/48 100/76 127/56 96/41  Pulse: 73 74 76 75  Temp: 98.2 F (36.8 C) 98.2 F (36.8 C) 98 F (36.7 C) 97.8 F (36.6 C)  TempSrc: Oral Oral Oral Oral  Resp: 18 18 18 18   Height:      Weight:      SpO2:  100% 99% 100%   Weight change:   Intake/Output Summary (Last 24 hours) at 12/26/13 0811 Last data filed at 12/25/13 1700  Gross per 24 hour  Intake    400 ml  Output      0 ml  Net    400 ml   BP 96/41  Pulse 75  Temp(Src) 97.8 F (36.6 C) (Oral)  Resp 18  Ht 5\' 7"  (1.702 m)  Wt 74.345 kg (163 lb 14.4 oz)  BMI 25.66 kg/m2  SpO2 100% General appearance: alert, cooperative and no distress Lungs: clear to auscultation bilaterally Heart: regular rate and rhythm, S1, S2 normal, no murmur, click, rub or gallop Abdomen: soft, non-tender; bowel sounds normal; no masses,  no organomegaly Extremities: extremities normal, atraumatic, no cyanosis or edema Neurologic: Alert and oriented X 3, normal strength and tone, sensation difference in RLE up to distal thigh and LLE up to proximal thigh.   Lab Results: Basic Metabolic Panel:  Recent Labs Lab 12/24/13 1630 12/25/13 0422  NA 140 139  K 4.2 4.4  CL 104 103  CO2 26 24  GLUCOSE 117* 113*  BUN 11 11  CREATININE 0.87 0.84  CALCIUM 8.6 9.2   Liver Function Tests:  Recent Labs Lab 12/24/13 1630  AST 15  ALT 12  ALKPHOS 92  BILITOT 0.3  PROT 6.3  ALBUMIN 3.8   No results found for this basename: LIPASE, AMYLASE,  in the last 168 hours No results found for this basename: AMMONIA,  in the  last 168 hours CBC:  Recent Labs Lab 12/24/13 1630 12/25/13 0422  WBC 6.7 7.9  NEUTROABS 2.9  --   HGB 13.8 14.8  HCT 41.6 44.6  MCV 92.0 89.7  PLT 240 270   Cardiac Enzymes: No results found for this basename: CKTOTAL, CKMB, CKMBINDEX, TROPONINI,  in the last 168 hours BNP: No results found for this basename: PROBNP,  in the last 168 hours D-Dimer: No results found for this basename: DDIMER,  in the last 168 hours CBG: No results found for this basename: GLUCAP,  in the last 168 hours Hemoglobin A1C: No results found for this basename: HGBA1C,  in the last 168 hours Fasting Lipid Panel: No results found for this basename: CHOL, HDL, LDLCALC, TRIG, CHOLHDL, LDLDIRECT,  in the last 168 hours Thyroid Function Tests: No results found for this basename: TSH, T4TOTAL, FREET4, T3FREE, THYROIDAB,  in the last 168 hours Coagulation: No results found for this basename: LABPROT, INR,  in the last 168 hours Anemia Panel: No results found for this basename: VITAMINB12, FOLATE, FERRITIN, TIBC, IRON, RETICCTPCT,  in the last 168 hours Urine Drug Screen: Drugs of Abuse     Component Value Date/Time   LABOPIA NONE DETECTED 12/24/2013 1700   COCAINSCRNUR NONE DETECTED  12/24/2013 1700   LABBENZ NONE DETECTED 12/24/2013 1700   AMPHETMU NONE DETECTED 12/24/2013 1700   THCU NONE DETECTED 12/24/2013 1700   LABBARB NONE DETECTED 12/24/2013 1700    Alcohol Level: No results found for this basename: ETH,  in the last 168 hours Urinalysis:  Recent Labs Lab 12/24/13 1700  COLORURINE YELLOW  LABSPEC 1.028  PHURINE 6.5  GLUCOSEU NEGATIVE  HGBUR NEGATIVE  BILIRUBINUR NEGATIVE  KETONESUR NEGATIVE  PROTEINUR NEGATIVE  UROBILINOGEN 1.0  NITRITE NEGATIVE  LEUKOCYTESUR NEGATIVE   Micro Results: No results found for this or any previous visit (from the past 240 hour(s)). Studies/Results: Mr Lodema Pilot Contrast  12/24/2013   CLINICAL DATA:  28 year old male with numbness in the left fingers x1 week.  Prior similar episode 5 months ago. Initial encounter.  EXAM: MRI HEAD WITHOUT AND WITH CONTRAST  MRI CERVICAL SPINE WITHOUT AND WITH CONTRAST  TECHNIQUE: Multiplanar, multiecho pulse sequences of the brain and surrounding structures, and cervical spine, to include the craniocervical junction and cervicothoracic junction, were obtained without and with intravenous contrast.  CONTRAST:  15mL MULTIHANCE GADOBENATE DIMEGLUMINE 529 MG/ML IV SOLN  COMPARISON:  None.  FINDINGS: MRI HEAD FINDINGS  Multifocal nodular T2 and FLAIR hyperintense lesions in the cerebral white matter, those near the lateral ventricles oriented perpendicular. See series 12. Significant bilateral temporal lobe white matter involvement. Mild thinning of the anterior body of the corpus callosum.  No lesions are identified with restricted diffusion. No enhancing lesions are identified.  The deep gray matter nuclei, brainstem and cerebellum are spared. Major intracranial vascular flow voids are preserved.  No restricted diffusion to suggest acute infarction. No midline shift, mass effect, evidence of mass lesion, ventriculomegaly, extra-axial collection or acute intracranial hemorrhage. Cervicomedullary junction and pituitary are within normal limits. No cortical encephalomalacia. Visible internal auditory structures appear normal. Visualized paranasal sinuses and mastoids are clear aside from minor mucosal thickening in the left maxillary sinus. Grossly normal orbits soft tissues. Visualized scalp soft tissues are within normal limits. Normal bone marrow signal.  MRI CERVICAL SPINE FINDINGS  Multiple T2 and STIR hyperintense lesions in the cervical spinal cord, including a fairly long segment involvement from the C3-C4 level to the C5-C6 level (series 1400 and 1900). The dorsal aspect of that plaque at the C5 level demonstrates subtle enhancement (series 22, image 27). There is a nonenhancing plaque extending from the C6-C7 level to the T1-T2 level.  There is a subtle plaque in the upper cord at the C2-C3 level eccentric to the left.  Minimal cord expansion occurs with the larger plaques. Cord signal at the cervicomedullary junction and extending into the T3-T4 upper thoracic spine levels appears within normal limits.  Mild straightening of cervical lordosis. No marrow edema or evidence of acute osseous abnormality. No significant cervical spine degenerative changes or spinal stenosis. Negative paraspinal soft tissues. No other abnormal enhancement.  IMPRESSION: Signal abnormality most compatible with multiple sclerosis affecting the brain and upper spinal cord. Evidence of acute demyelination in the spinal cord at the C5 level.   Electronically Signed   By: Augusto Gamble M.D.   On: 12/24/2013 19:58   Mr Cervical Spine W Wo Contrast  12/24/2013   CLINICAL DATA:  28 year old male with numbness in the left fingers x1 week. Prior similar episode 5 months ago. Initial encounter.  EXAM: MRI HEAD WITHOUT AND WITH CONTRAST  MRI CERVICAL SPINE WITHOUT AND WITH CONTRAST  TECHNIQUE: Multiplanar, multiecho pulse sequences of the brain and surrounding structures, and  cervical spine, to include the craniocervical junction and cervicothoracic junction, were obtained without and with intravenous contrast.  CONTRAST:  15mL MULTIHANCE GADOBENATE DIMEGLUMINE 529 MG/ML IV SOLN  COMPARISON:  None.  FINDINGS: MRI HEAD FINDINGS  Multifocal nodular T2 and FLAIR hyperintense lesions in the cerebral white matter, those near the lateral ventricles oriented perpendicular. See series 12. Significant bilateral temporal lobe white matter involvement. Mild thinning of the anterior body of the corpus callosum.  No lesions are identified with restricted diffusion. No enhancing lesions are identified.  The deep gray matter nuclei, brainstem and cerebellum are spared. Major intracranial vascular flow voids are preserved.  No restricted diffusion to suggest acute infarction. No midline shift, mass  effect, evidence of mass lesion, ventriculomegaly, extra-axial collection or acute intracranial hemorrhage. Cervicomedullary junction and pituitary are within normal limits. No cortical encephalomalacia. Visible internal auditory structures appear normal. Visualized paranasal sinuses and mastoids are clear aside from minor mucosal thickening in the left maxillary sinus. Grossly normal orbits soft tissues. Visualized scalp soft tissues are within normal limits. Normal bone marrow signal.  MRI CERVICAL SPINE FINDINGS  Multiple T2 and STIR hyperintense lesions in the cervical spinal cord, including a fairly long segment involvement from the C3-C4 level to the C5-C6 level (series 1400 and 1900). The dorsal aspect of that plaque at the C5 level demonstrates subtle enhancement (series 22, image 27). There is a nonenhancing plaque extending from the C6-C7 level to the T1-T2 level. There is a subtle plaque in the upper cord at the C2-C3 level eccentric to the left.  Minimal cord expansion occurs with the larger plaques. Cord signal at the cervicomedullary junction and extending into the T3-T4 upper thoracic spine levels appears within normal limits.  Mild straightening of cervical lordosis. No marrow edema or evidence of acute osseous abnormality. No significant cervical spine degenerative changes or spinal stenosis. Negative paraspinal soft tissues. No other abnormal enhancement.  IMPRESSION: Signal abnormality most compatible with multiple sclerosis affecting the brain and upper spinal cord. Evidence of acute demyelination in the spinal cord at the C5 level.   Electronically Signed   By: Augusto Gamble M.D.   On: 12/24/2013 19:58   Medications: Scheduled Meds: . enoxaparin (LOVENOX) injection  40 mg Subcutaneous Q24H  . gabapentin  100 mg Oral TID  . methylPREDNISolone (SOLU-MEDROL) injection  500 mg Intravenous Q12H  . nicotine  14 mg Transdermal QHS  . pantoprazole  40 mg Oral Daily   Continuous Infusions:  PRN  Meds:.acetaminophen, acetaminophen, ondansetron (ZOFRAN) IV, ondansetron  Assessment/Plan: Principal Problem:   Multiple sclerosis  Multiple sclerosis: Diagnosed based on clinical presentation physical examination findings. MRI is very classic for multiple sclerosis. No signs of infection and we do not suspect meningitis. Acute stroke has been excluded based on brain imaging. However, other differentials including nutritional deficiencies like, thiamine, and vitamin B12 cannot be excluded at this time. MR brain and cervical spine demonstrating signal abnormality most compatible with multiple sclerosis with acute demyelination of the spinal cord at the C5 level. PT and OT report that he is completely independent and have signed off.  Plan  - Neurology consult, appreciate recs  - Solumedrol 500mg  IV Q12hr for 6 doses, then prednisone taper  - gabapentin 100 mg 3 times a day  - Protonix 40mg  daily  - Patient has no health insurance. [facility] will see him if he is able to obtain [facility] financial assistance. Will give him the paperwork for [facility] financial assistance. Case  management also recommended that [facility] given his financial constraints. He has no PCP in the community. We will set him up with the internal medicine center for his hospital followup.  - Provided patient and his family with information regarding MS  FEN: regular diet   DVT ppx: lovenox 40mg  daily   This is a Psychologist, occupationalMedical Student Note.  The care of the patient was discussed with Dr. Zada GirtKazibwe and the assessment and plan formulated with their assistance.  Please see their attached note for official documentation of the daily encounter.   LOS: 2 days   Foye ClockJames D Marilynn Ekstein, Med Student 12/26/2013, 8:11 AM

## 2013-12-26 NOTE — ED Provider Notes (Signed)
Medical screening examination/treatment/procedure(s) were performed by non-physician practitioner and as supervising physician I was immediately available for consultation/collaboration.  Manna Gose, MD 12/26/13 1240 

## 2013-12-26 NOTE — Progress Notes (Signed)
I have seen the patient and reviewed the daily progress note by Aliene AltesByrne MS IV and discussed the care of the patient with them.  Please see note for my findings, assessment, and plans/additions.   Subjective:  He feeling a little better today in terms of his left upper numbness and tingling.  No overnight events.  Objective: Vital signs in last 24 hours: Filed Vitals:   12/25/13 1800 12/26/13 0555 12/26/13 1044 12/26/13 1438  BP: 127/56 96/41 111/49 121/52  Pulse: 76 75 82 77  Temp: 98 F (36.7 C) 97.8 F (36.6 C) 98.6 F (37 C) 98.2 F (36.8 C)  TempSrc: Oral Oral Oral Oral  Resp: 18 18 18 18   Height:      Weight:      SpO2: 99% 100% 100% 100%   Weight change:  No intake or output data in the 24 hours ending 12/26/13 1730 General appearance: alert, cooperative and no distress. Family at bedside.   Lungs: clear to auscultation bilaterally  Heart: regular rate and rhythm, S1, S2 normal, no murmur Extremities: extremities normal, atraumatic, no cyanosis or edema  Neurologic: Mental status: Alert and oriented X3.  Cranial nerve: II-XII intact, PEARL  Sensory: abnormal sensation in LUE to shoulder, RUE to wrist, LLE and RLE to mid-pelvis. He has reduced sensation from C4 all the way down to his LE. No much changed from yesterday. Motor: Appears to have 5-/5 in with left hand grip and LLE at thigh flexion.  Reflexes: 2+ and symmetric throughout  Lab Results: Basic Metabolic Panel:  Recent Labs Lab 12/24/13 1630 12/25/13 0422  NA 140 139  K 4.2 4.4  CL 104 103  CO2 26 24  GLUCOSE 117* 113*  BUN 11 11  CREATININE 0.87 0.84  CALCIUM 8.6 9.2   Liver Function Tests:  Recent Labs Lab 12/24/13 1630  AST 15  ALT 12  ALKPHOS 92  BILITOT 0.3  PROT 6.3  ALBUMIN 3.8   No results found for this basename: LIPASE, AMYLASE,  in the last 168 hours No results found for this basename: AMMONIA,  in the last 168 hours CBC:  Recent Labs Lab 12/24/13 1630 12/25/13 0422  WBC  6.7 7.9  NEUTROABS 2.9  --   HGB 13.8 14.8  HCT 41.6 44.6  MCV 92.0 89.7  PLT 240 270   Urine Drug Screen: Drugs of Abuse     Component Value Date/Time   LABOPIA NONE DETECTED 12/24/2013 1700   COCAINSCRNUR NONE DETECTED 12/24/2013 1700   LABBENZ NONE DETECTED 12/24/2013 1700   AMPHETMU NONE DETECTED 12/24/2013 1700   THCU NONE DETECTED 12/24/2013 1700   LABBARB NONE DETECTED 12/24/2013 1700    Urinalysis:  Recent Labs Lab 12/24/13 1700  COLORURINE YELLOW  LABSPEC 1.028  PHURINE 6.5  GLUCOSEU NEGATIVE  HGBUR NEGATIVE  BILIRUBINUR NEGATIVE  KETONESUR NEGATIVE  PROTEINUR NEGATIVE  UROBILINOGEN 1.0  NITRITE NEGATIVE  LEUKOCYTESUR NEGATIVE    Micro Results: No results found for this or any previous visit (from the past 240 hour(s)). Studies/Results: Mike Lodema PilotBrain W Wo Contrast  12/24/2013   CLINICAL DATA:  28 year old male with numbness in the left fingers x1 week. Prior similar episode 5 months ago. Initial encounter.  EXAM: MRI HEAD WITHOUT AND WITH CONTRAST  MRI CERVICAL SPINE WITHOUT AND WITH CONTRAST  TECHNIQUE: Multiplanar, multiecho pulse sequences of the brain and surrounding structures, and cervical spine, to include the craniocervical junction and cervicothoracic junction, were obtained without and with intravenous contrast.  CONTRAST:  15mL MULTIHANCE  GADOBENATE DIMEGLUMINE 529 MG/ML IV SOLN  COMPARISON:  None.  FINDINGS: MRI HEAD FINDINGS  Multifocal nodular T2 and FLAIR hyperintense lesions in the cerebral white matter, those near the lateral ventricles oriented perpendicular. See series 12. Significant bilateral temporal lobe white matter involvement. Mild thinning of the anterior body of the corpus callosum.  No lesions are identified with restricted diffusion. No enhancing lesions are identified.  The deep gray matter nuclei, brainstem and cerebellum are spared. Major intracranial vascular flow voids are preserved.  No restricted diffusion to suggest acute infarction. No midline  shift, mass effect, evidence of mass lesion, ventriculomegaly, extra-axial collection or acute intracranial hemorrhage. Cervicomedullary junction and pituitary are within normal limits. No cortical encephalomalacia. Visible internal auditory structures appear normal. Visualized paranasal sinuses and mastoids are clear aside from minor mucosal thickening in the left maxillary sinus. Grossly normal orbits soft tissues. Visualized scalp soft tissues are within normal limits. Normal bone marrow signal.  MRI CERVICAL SPINE FINDINGS  Multiple T2 and STIR hyperintense lesions in the cervical spinal cord, including a fairly long segment involvement from the C3-C4 level to the C5-C6 level (series 1400 and 1900). The dorsal aspect of that plaque at the C5 level demonstrates subtle enhancement (series 22, image 27). There is a nonenhancing plaque extending from the C6-C7 level to the T1-T2 level. There is a subtle plaque in the upper cord at the C2-C3 level eccentric to the left.  Minimal cord expansion occurs with the larger plaques. Cord signal at the cervicomedullary junction and extending into the T3-T4 upper thoracic spine levels appears within normal limits.  Mild straightening of cervical lordosis. No marrow edema or evidence of acute osseous abnormality. No significant cervical spine degenerative changes or spinal stenosis. Negative paraspinal soft tissues. No other abnormal enhancement.  IMPRESSION: Signal abnormality most compatible with multiple sclerosis affecting the brain and upper spinal cord. Evidence of acute demyelination in the spinal cord at the C5 level.   Electronically Signed   By: Augusto Gamble M.D.   On: 12/24/2013 19:58   Mike Cervical Spine W Wo Contrast  12/24/2013   CLINICAL DATA:  29 year old male with numbness in the left fingers x1 week. Prior similar episode 5 months ago. Initial encounter.  EXAM: MRI HEAD WITHOUT AND WITH CONTRAST  MRI CERVICAL SPINE WITHOUT AND WITH CONTRAST  TECHNIQUE:  Multiplanar, multiecho pulse sequences of the brain and surrounding structures, and cervical spine, to include the craniocervical junction and cervicothoracic junction, were obtained without and with intravenous contrast.  CONTRAST:  15mL MULTIHANCE GADOBENATE DIMEGLUMINE 529 MG/ML IV SOLN  COMPARISON:  None.  FINDINGS: MRI HEAD FINDINGS  Multifocal nodular T2 and FLAIR hyperintense lesions in the cerebral white matter, those near the lateral ventricles oriented perpendicular. See series 12. Significant bilateral temporal lobe white matter involvement. Mild thinning of the anterior body of the corpus callosum.  No lesions are identified with restricted diffusion. No enhancing lesions are identified.  The deep gray matter nuclei, brainstem and cerebellum are spared. Major intracranial vascular flow voids are preserved.  No restricted diffusion to suggest acute infarction. No midline shift, mass effect, evidence of mass lesion, ventriculomegaly, extra-axial collection or acute intracranial hemorrhage. Cervicomedullary junction and pituitary are within normal limits. No cortical encephalomalacia. Visible internal auditory structures appear normal. Visualized paranasal sinuses and mastoids are clear aside from minor mucosal thickening in the left maxillary sinus. Grossly normal orbits soft tissues. Visualized scalp soft tissues are within normal limits. Normal bone marrow signal.  MRI CERVICAL SPINE FINDINGS  Multiple T2 and STIR hyperintense lesions in the cervical spinal cord, including a fairly long segment involvement from the C3-C4 level to the C5-C6 level (series 1400 and 1900). The dorsal aspect of that plaque at the C5 level demonstrates subtle enhancement (series 22, image 27). There is a nonenhancing plaque extending from the C6-C7 level to the T1-T2 level. There is a subtle plaque in the upper cord at the C2-C3 level eccentric to the left.  Minimal cord expansion occurs with the larger plaques. Cord signal at  the cervicomedullary junction and extending into the T3-T4 upper thoracic spine levels appears within normal limits.  Mild straightening of cervical lordosis. No marrow edema or evidence of acute osseous abnormality. No significant cervical spine degenerative changes or spinal stenosis. Negative paraspinal soft tissues. No other abnormal enhancement.  IMPRESSION: Signal abnormality most compatible with multiple sclerosis affecting the brain and upper spinal cord. Evidence of acute demyelination in the spinal cord at the C5 level.   Electronically Signed   By: Augusto Gamble M.D.   On: 12/24/2013 19:58   Medications: I have reviewed the patient's current medications. Scheduled Meds: . enoxaparin (LOVENOX) injection  40 mg Subcutaneous Q24H  . gabapentin  100 mg Oral TID  . methylPREDNISolone (SOLU-MEDROL) injection  500 mg Intravenous Q12H  . nicotine  14 mg Transdermal QHS  . pantoprazole  40 mg Oral Daily   Continuous Infusions:  PRN Meds:.acetaminophen, acetaminophen, ondansetron (ZOFRAN) IV, ondansetron Assessment/Plan: Principal Problem:   Multiple sclerosis   Multiple sclerosis: Diagnosed based on clinical presentation physical examination findings. MRI is very classic for multiple sclerosis. No signs of infection and we do not suspect meningitis. Acute stroke has been excluded based on brain imaging. However, other differentials including nutritional deficiencies like, thiamine, and vitamin B12 cannot be excluded at this time. Mike brain and cervical spine demonstrating signal abnormality most compatible with multiple sclerosis with acute demyelination of the spinal cord at the C5 level. Has been evaluated by PT and OT adn they do not recommend further follow up for him.  Plan  - Neurology consult, appreciate recs  - Solumedrol 500mg  IV Q12hr for 6 doses, then prednisone taper  - gabapentin 100 mg 3 times a day  - Protonix 40mg  daily  - Patient has no health insurance. [facility] and  [facility] will see him if he is able to obtain [facility] financial assistance. Will give him the paperwork for [facility] financial assistance. Case management also recommended that he could be seen at [facility] or [facility] neurology given his financial constraints. He has no PCP in the community. We will set him up with the internal medicine center for his hospital followup.  - during his outpatient follow up we will arrange him to see Rudell Cobb and he might be eligible for orange card.  - Provided patient and his family with information regarding MS   FEN: regular diet   DVT ppx: lovenox 40mg  daily    LOS: 2 days   Dow Adolph PGY 3 - Internal Medicine Teaching Service Pager: [phone] 12/26/2013, 5:30 PM

## 2013-12-27 LAB — GLUCOSE, CAPILLARY: GLUCOSE-CAPILLARY: 127 mg/dL — AB (ref 70–99)

## 2013-12-27 MED ORDER — PANTOPRAZOLE SODIUM 40 MG PO TBEC: 40.0000 mg | DELAYED_RELEASE_TABLET | Freq: Every day | ORAL | Status: DC

## 2013-12-27 MED ORDER — PREDNISOLONE 5 MG PO TABS: ORAL_TABLET | ORAL | Status: DC

## 2013-12-27 NOTE — Discharge Instructions (Addendum)
·   Thank you for allowing [place] to be involved in your healthcare while you were hospitalized at [facility].   Please note that there have been changes to your home medications.  --> PLEASE LOOK AT YOUR DISCHARGE MEDICATION LIST FOR DETAILS. Please take 60 mg on Thursday, 12/28/2013 Then Take 50 mg on Friday, 12/29/2013 Then take 40 mg on Saturday, 12/30/2013 Then take 30 mg on Sunday, 12/31/2013 Then take 20 mg on Monday, 01/01/2014 Then take 10 mg on Tuesday, 01/02/2014 and stopped.   Please call your PCP if you have any questions or concerns, or any difficulty getting any of your medications.  Please return to the ER if you have worsening of your symptoms or new severe symptoms arise. Multiple Sclerosis Multiple sclerosis (MS) is a disease of the central nervous system. It leads to the loss of the insulating covering of the nerves (myelin sheath) of your brain. When this happens, brain signals do not get sent properly or may not get sent at all. The age of onset of MS varies.  CAUSES The cause of MS is unknown. However, it is more common in the [place] than in the [place]. RISK FACTORS There is a higher number of women with MS than men. MS is not an illness that is passed down to you from your family members (inherited). However, your risk of MS is higher if you have a relative with MS. SIGNS AND SYMPTOMS  The symptoms of MS occur in episodes or attacks. These attacks may last weeks to months. There may be long periods of almost no symptoms between attacks. The symptoms of MS vary. This is because of the many different ways it affects the central nervous system. The main symptoms of MS include: Vision problems and eye pain. Numbness. Weakness. Inability to move your arms, hands, feet, or legs (paralysis). Balance problems. Tremors. DIAGNOSIS  Your health care provider can diagnose MS with the help of imaging exams and lab tests. These may  include specialized X-ray exams and spinal fluid tests. The best imaging exam to confirm a diagnosis of MS is an MRI. TREATMENT  There is no known cure for MS, but there are medicines that can decrease the number and frequency of attacks. Steroids are often used for short-term relief. Physical and occupational therapy may also help. There are also many new alternative or complementary treatments available to help control the symptoms of MS. Ask your health care provider if any of these other options are right for you. HOME CARE INSTRUCTIONS  Take medicines as directed by your health care provider. Exercise as directed by your health care provider. SEEK MEDICAL CARE IF: You begin to feel depressed. SEEK IMMEDIATE MEDICAL CARE IF: You develop paralysis. You have problems with bladder, bowel, or sexual function. You develop mental changes, such as forgetfulness or mood swings. You have a period of uncontrolled movements (seizure). Document Released: 05/01/2000 Document Revised: 05/09/2013 Document Reviewed: 01/09/2013 [facility] Patient Information 2015 [place], [place]. This information is not intended to replace advice given to you by your health care provider. Make sure you discuss any questions you have with your health care provider.

## 2013-12-27 NOTE — Progress Notes (Addendum)
I have seen the patient and reviewed the daily progress note by Aliene AltesByrne MS IV and discussed the care of the patient with them.  Please see note for my findings, assessment, and plans/additions.   Subjective: He feels well today. He reports that his symptoms of numbness have largely improved. No overnight events. He is receiving his last dose of IV Solu-Medrol this morning.  Objective: Vital signs in last 24 hours: Filed Vitals:   12/26/13 1044 12/26/13 1438 12/26/13 2103 12/27/13 0544  BP: 111/49 121/52 120/64 110/60  Pulse: 82 77 84 62  Temp: 98.6 F (37 C) 98.2 F (36.8 C) 97.4 F (36.3 C) 97.8 F (36.6 C)  TempSrc: Oral Oral Oral Oral  Resp: 18 18 18 18   Height:      Weight:      SpO2: 100% 100% 99% 98%   Weight change:  No intake or output data in the 24 hours ending 12/27/13 1346 General appearance: alert, cooperative and no distress. Seated in bed.   Lungs: clear to auscultation bilaterally  Heart: regular rate and rhythm, S1, S2 normal, no murmur Extremities: extremities normal, atraumatic, no cyanosis or edema  Neurologic: Mental status: Alert and oriented X3.  Cranial nerve: II-XII intact, PEARL  Sensory: Still has abnormal from C4 all the way down to his lower extremities. No much changed from yesterday, even though subjectively better. Motor: Appears to have 5-/5 in with left hand grip and LLE at thigh flexion.  Reflexes: 2+ and symmetric throughout  Lab Results: Basic Metabolic Panel:  Recent Labs Lab 12/24/13 1630 12/25/13 0422  NA 140 139  K 4.2 4.4  CL 104 103  CO2 26 24  GLUCOSE 117* 113*  BUN 11 11  CREATININE 0.87 0.84  CALCIUM 8.6 9.2   Liver Function Tests:  Recent Labs Lab 12/24/13 1630  AST 15  ALT 12  ALKPHOS 92  BILITOT 0.3  PROT 6.3  ALBUMIN 3.8   No results found for this basename: LIPASE, AMYLASE,  in the last 168 hours No results found for this basename: AMMONIA,  in the last 168 hours CBC:  Recent Labs Lab 12/24/13 1630  12/25/13 0422  WBC 6.7 7.9  NEUTROABS 2.9  --   HGB 13.8 14.8  HCT 41.6 44.6  MCV 92.0 89.7  PLT 240 270   Urine Drug Screen: Drugs of Abuse     Component Value Date/Time   LABOPIA NONE DETECTED 12/24/2013 1700   COCAINSCRNUR NONE DETECTED 12/24/2013 1700   LABBENZ NONE DETECTED 12/24/2013 1700   AMPHETMU NONE DETECTED 12/24/2013 1700   THCU NONE DETECTED 12/24/2013 1700   LABBARB NONE DETECTED 12/24/2013 1700    Urinalysis:  Recent Labs Lab 12/24/13 1700  COLORURINE YELLOW  LABSPEC 1.028  PHURINE 6.5  GLUCOSEU NEGATIVE  HGBUR NEGATIVE  BILIRUBINUR NEGATIVE  KETONESUR NEGATIVE  PROTEINUR NEGATIVE  UROBILINOGEN 1.0  NITRITE NEGATIVE  LEUKOCYTESUR NEGATIVE    Micro Results: No results found for this or any previous visit (from the past 240 hour(s)). Studies/Results: No results found. Medications: I have reviewed the patient's current medications. Scheduled Meds: . enoxaparin (LOVENOX) injection  40 mg Subcutaneous Q24H  . gabapentin  100 mg Oral TID  . nicotine  14 mg Transdermal QHS  . pantoprazole  40 mg Oral Daily   Continuous Infusions:  PRN Meds:.acetaminophen, acetaminophen, ondansetron (ZOFRAN) IV, ondansetron Assessment/Plan: Principal Problem:   Multiple sclerosis   Multiple sclerosis: Somehow subjectively improved. Diagnosed based on clinical presentation physical examination findings. MRI is very  classic for multiple sclerosis. No signs of infection and we do not suspect meningitis. Acute stroke has been excluded based on brain imaging. However, other differentials including nutritional deficiencies like, thiamine, and vitamin B12 cannot be excluded at this time. MR brain and cervical spine demonstrating signal abnormality most compatible with multiple sclerosis with acute demyelination of the spinal cord at the C5 level. Has been evaluated by PT and OT and they do not recommend further follow up for him.  Plan  - He will be discharged on a steroid taper - Will  make appointments for him with neurology outpatient - Case discussed with case manager, and the assistance will be provided for his medications at discharge, including steroids, and PPI for 2 weeks.  FEN: regular diet   DVT ppx: lovenox 40mg  daily    LOS: 3 days   Dow Adolph PGY 3 - Internal Medicine Teaching Service Pager: [phone] 12/27/2013, 1:46 PM

## 2013-12-27 NOTE — Progress Notes (Signed)
Patient is discharged from room 4N29 at this time. Alert and in stable condition. IV site d/c'd. Instructions read to patient and understanding verbalized. Ambulate off unit with significant other and belongings at side. Prescription given to patient.

## 2013-12-27 NOTE — Progress Notes (Signed)
Talked to patient about follow up medical care; patient does not have a PCP, patient is agreeable to go to the [facility] and [facility]- apt  January 01, 2014 at 9 am; Eligibility apt for an "Orange" card to assist him with his medication is Sept 10, 2015 at 12:30 pm; At discharge, the patient will be able to get his prescriptions filled at the [facility] and [facility]; Financial Counselor talked to the patient also, patient will apply for Medicaid in Sept and is working on paperwork for DisabilityAbelino Derrick RN,BSN,MHA [phone]

## 2013-12-27 NOTE — Progress Notes (Signed)
MD please address patients last documented BM of 8/8.

## 2013-12-27 NOTE — Discharge Summary (Signed)
Name: Mike Lowery MRN: [id number] DOB: 1985/09/25 28 y.o. PCP: No primary provider on file.  Date of Admission: 12/24/2013  3:20 PM Date of Discharge: 12/27/2013 Attending Physician: Dr Margarito Liner Discharge Diagnosis: 1. Principal Problem:   Multiple sclerosis  Discharge Medications:   Medication List         pantoprazole 40 MG tablet  Commonly known as:  PROTONIX  Take 1 tablet (40 mg total) by mouth daily.     prednisoLONE 5 MG Tabs tablet  - Please take 60 mg on Thursday, 12/28/2013  - Then Take 50 mg on Friday, 12/29/2013  - Then take 40 mg on Saturday, 12/30/2013  - Then take 30 mg on Sunday, 12/31/2013  - Then take 20 mg on Monday, 01/01/2014  - Then take 10 mg on Tuesday, 01/02/2014 and stopped.        Disposition and follow-up:   Mike Lowery was discharged from [facility] in Good condition.  At the hospital follow up visit please address:   1. Patient will need to be referred to financial counselor for financial assistance. He will also benefit from patient, assistance programs that may be available for him. 2. Labs / imaging needed at time of follow-up: None 3.  4. Pending labs/ test needing follow-up: none  Follow-up Appointments:     Follow-up Information   Follow up with [facility] On 01/01/2014. (at 9 am; Eligibility apt for "orange" card to assist with meds is Sept 10, 2015 at 12:30;)    Contact information:   [address] [place] [address] [phone]      Follow up with [facility] On 02/05/2014. (at 2:00pm)    Contact information:   [address] [place] [address] [phone]      Discharge Instructions: Discharge Instructions   Call MD for:  difficulty breathing, headache or visual disturbances    Complete by:  As directed      Call MD for:  extreme fatigue    Complete by:  As directed      Call MD for:  persistant dizziness or  light-headedness    Complete by:  As directed      Diet - low sodium heart healthy    Complete by:  As directed      Increase activity slowly    Complete by:  As directed            Consultations:  neurology  Procedures Performed:  Mr Lodema Pilot Contrast  12/24/2013   CLINICAL DATA:  28 year old male with numbness in the left fingers x1 week. Prior similar episode 5 months ago. Initial encounter.  EXAM: MRI HEAD WITHOUT AND WITH CONTRAST  MRI CERVICAL SPINE WITHOUT AND WITH CONTRAST  TECHNIQUE: Multiplanar, multiecho pulse sequences of the brain and surrounding structures, and cervical spine, to include the craniocervical junction and cervicothoracic junction, were obtained without and with intravenous contrast.  CONTRAST:  15mL MULTIHANCE GADOBENATE DIMEGLUMINE 529 MG/ML IV SOLN  COMPARISON:  None.  FINDINGS: MRI HEAD FINDINGS  Multifocal nodular T2 and FLAIR hyperintense lesions in the cerebral white matter, those near the lateral ventricles oriented perpendicular. See series 12. Significant bilateral temporal lobe white matter involvement. Mild thinning of the anterior body of the corpus callosum.  No lesions are identified with restricted diffusion. No enhancing lesions are identified.  The deep gray matter nuclei, brainstem and cerebellum are spared. Major intracranial vascular flow voids are  preserved.  No restricted diffusion to suggest acute infarction. No midline shift, mass effect, evidence of mass lesion, ventriculomegaly, extra-axial collection or acute intracranial hemorrhage. Cervicomedullary junction and pituitary are within normal limits. No cortical encephalomalacia. Visible internal auditory structures appear normal. Visualized paranasal sinuses and mastoids are clear aside from minor mucosal thickening in the left maxillary sinus. Grossly normal orbits soft tissues. Visualized scalp soft tissues are within normal limits. Normal bone marrow signal.  MRI CERVICAL SPINE FINDINGS   Multiple T2 and STIR hyperintense lesions in the cervical spinal cord, including a fairly long segment involvement from the C3-C4 level to the C5-C6 level (series 1400 and 1900). The dorsal aspect of that plaque at the C5 level demonstrates subtle enhancement (series 22, image 27). There is a nonenhancing plaque extending from the C6-C7 level to the T1-T2 level. There is a subtle plaque in the upper cord at the C2-C3 level eccentric to the left.  Minimal cord expansion occurs with the larger plaques. Cord signal at the cervicomedullary junction and extending into the T3-T4 upper thoracic spine levels appears within normal limits.  Mild straightening of cervical lordosis. No marrow edema or evidence of acute osseous abnormality. No significant cervical spine degenerative changes or spinal stenosis. Negative paraspinal soft tissues. No other abnormal enhancement.  IMPRESSION: Signal abnormality most compatible with multiple sclerosis affecting the brain and upper spinal cord. Evidence of acute demyelination in the spinal cord at the C5 level.   Electronically Signed   By: Augusto GambleLee  Hall M.D.   On: 12/24/2013 19:58   Mr Cervical Spine W Wo Contrast  12/24/2013   CLINICAL DATA:  28 year old male with numbness in the left fingers x1 week. Prior similar episode 5 months ago. Initial encounter.  EXAM: MRI HEAD WITHOUT AND WITH CONTRAST  MRI CERVICAL SPINE WITHOUT AND WITH CONTRAST  TECHNIQUE: Multiplanar, multiecho pulse sequences of the brain and surrounding structures, and cervical spine, to include the craniocervical junction and cervicothoracic junction, were obtained without and with intravenous contrast.  CONTRAST:  15mL MULTIHANCE GADOBENATE DIMEGLUMINE 529 MG/ML IV SOLN  COMPARISON:  None.  FINDINGS: MRI HEAD FINDINGS  Multifocal nodular T2 and FLAIR hyperintense lesions in the cerebral white matter, those near the lateral ventricles oriented perpendicular. See series 12. Significant bilateral temporal lobe white  matter involvement. Mild thinning of the anterior body of the corpus callosum.  No lesions are identified with restricted diffusion. No enhancing lesions are identified.  The deep gray matter nuclei, brainstem and cerebellum are spared. Major intracranial vascular flow voids are preserved.  No restricted diffusion to suggest acute infarction. No midline shift, mass effect, evidence of mass lesion, ventriculomegaly, extra-axial collection or acute intracranial hemorrhage. Cervicomedullary junction and pituitary are within normal limits. No cortical encephalomalacia. Visible internal auditory structures appear normal. Visualized paranasal sinuses and mastoids are clear aside from minor mucosal thickening in the left maxillary sinus. Grossly normal orbits soft tissues. Visualized scalp soft tissues are within normal limits. Normal bone marrow signal.  MRI CERVICAL SPINE FINDINGS  Multiple T2 and STIR hyperintense lesions in the cervical spinal cord, including a fairly long segment involvement from the C3-C4 level to the C5-C6 level (series 1400 and 1900). The dorsal aspect of that plaque at the C5 level demonstrates subtle enhancement (series 22, image 27). There is a nonenhancing plaque extending from the C6-C7 level to the T1-T2 level. There is a subtle plaque in the upper cord at the C2-C3 level eccentric to the left.  Minimal cord expansion occurs with the  larger plaques. Cord signal at the cervicomedullary junction and extending into the T3-T4 upper thoracic spine levels appears within normal limits.  Mild straightening of cervical lordosis. No marrow edema or evidence of acute osseous abnormality. No significant cervical spine degenerative changes or spinal stenosis. Negative paraspinal soft tissues. No other abnormal enhancement.  IMPRESSION: Signal abnormality most compatible with multiple sclerosis affecting the brain and upper spinal cord. Evidence of acute demyelination in the spinal cord at the C5 level.    Electronically Signed   By: Augusto Gamble M.D.   On: 12/24/2013 19:58   Admission HPI:   Chief Complaint: Left sided numbness   History of Present Illness: Mr. Correll is a 28 year old man with no significant medical history presenting with left sided numbness x 1 week. The weakness started in his hands and feet and progressed to his entire left side to the level of about C5/T1. He also is starting to feel some numbness in his RLE that started from his toes and has progressed to midthigh. He denies recent travel. No sick contacts. Endorses fatigue, urinary incontinence and paresthesias. Denies fevers, chills, vision change, cough, trouble swallowing, shortness of breath, chest pain, nausea, vomiting diarrhea, rash, trouble walking. He had a similar episode 4-5 months ago that was milder in presentation and resolved spontaneously after a week. Denies family history of neurologic problems. Works in [employer].   On Neurologic exam: Alert and oriented x 3. CNII-XII intact. Mildly reduced strength of LUE and LLE. Normal strength on R side. Sensation reduced LUE and LLE. RLE reduced sensation but less than LLE. Normal reflexes throughout.   Hospital Course by problem list: Principal Problem:   Multiple sclerosis   Multiple sclerosis: Mr. Clausen presented to [facility] for left sided numbness. He was diagnosed with multiple sclerosis based on his clinical presentation with the relapsing remitting nature and MRI findings fulfilling McDonald criteria. MR brain and cervical spine demonstrated signal abnormality most compatible with multiple sclerosis affecting the brain and upper spinal cord, with evidence of acute demyelination in the spinal cord at the C5 level. There were no signs of infection or nutritional deficiencies. He was managed in consultation with the neurologist at this during his hospitalization. He was given Solumedrol 500 mg BID IV for 3 days and will start a Prednisone taper at 60 mg and tapering by  10 mg per day over a few days and then stop. At the time of his discharge, he was neurological deficits had significantly improved, even though he still had reduced sensation C4 dermatome and caudally. PT and OT evaluated him and found to be functionally independent, not requiring any assistance. He was discharged on a 2 week course of Protonix to counteract the side effects of steroids with possible GI irritation. He will establish care with a PCP at the [facility] on Monday, August 17th at [facility]. In addition, the patient was uninsured and will require financial assistance for his medical care. The patient has spoke with the [facility] financial assistance program and will apply for assistance prior to his Neurology outpatient appointment. He is scheduled to see a physician at [facility] on September 21st at 2pm.  Discharge Vitals:   BP 110/60  Pulse 62  Temp(Src) 97.8 F (36.6 C) (Oral)  Resp 18  Ht 5\' 7"  (1.702 m)  Wt 163 lb 14.4 oz (74.345 kg)  BMI 25.66 kg/m2  SpO2 98%  Discharge Labs:  Results for orders placed during the hospital  encounter of 12/24/13 (from the past 24 hour(s))  GLUCOSE, CAPILLARY     Status: Abnormal   Collection Time    12/27/13  7:49 AM      Result Value Ref Range   Glucose-Capillary 127 (*) 70 - 99 mg/dL    Signed: Dow Adolph, MD 12/27/2013, 1:40 PM    Services Ordered on Discharge: none Equipment Ordered on Discharge: none

## 2013-12-27 NOTE — Progress Notes (Addendum)
NEURO HOSPITALIST PROGRESS NOTE   SUBJECTIVE:                                                                                                                        No further left arm weakness or numbness.   OBJECTIVE:                                                                                                                           Vital signs in last 24 hours: Temp:  [97.4 F (36.3 C)-98.2 F (36.8 C)] 97.8 F (36.6 C) (08/12 0544) Pulse Rate:  [62-84] 62 (08/12 0544) Resp:  [18] 18 (08/12 0544) BP: (110-121)/(52-64) 110/60 mmHg (08/12 0544) SpO2:  [98 %-100 %] 98 % (08/12 0544)  Intake/Output from previous day:   Intake/Output this shift:   Nutritional status: General  History reviewed. No pertinent past medical history.    Neurologic Exam:  General: NAD Mental Status: Alert, oriented, thought content appropriate.  Speech fluent without evidence of aphasia.  Able to follow 3 step commands without difficulty. Cranial Nerves: II: Visual fields grossly normal, pupils equal, round, reactive to light and accommodation III,IV, VI: ptosis not present, extra-ocular motions intact bilaterally V,VII: smile symmetric, facial light touch sensation normal bilaterally VIII: hearing normal bilaterally IX,X: gag reflex present XI: bilateral shoulder shrug XII: midline tongue extension without atrophy or fasciculations  Motor: Right : Upper extremity   5/5    Left:     Upper extremity   5/5  Lower extremity   5/5     Lower extremity   5/5 Tone and bulk:normal tone throughout; no atrophy noted Sensory: Pinprick and light touch intact throughout, bilaterally Deep Tendon Reflexes:  Right: Upper Extremity   Left: Upper extremity   biceps (C-5 to C-6) 2/4   biceps (C-5 to C-6) 2/4 tricep (C7) 2/4    triceps (C7) 2/4 Brachioradialis (C6) 2/4  Brachioradialis (C6) 2/4  Lower Extremity Lower Extremity  quadriceps (L-2 to L-4) 2/4    quadriceps (L-2 to L-4) 2/4 Achilles (S1) 2/4   Achilles (S1) 2/4  Plantars: Right: downgoing   Left: downgoing Cerebellar: normal finger-to-nose,  normal heel-to-shin test    Lab Results: Basic Metabolic Panel:  Recent Labs Lab  12/24/13 1630 12/25/13 0422  NA 140 139  K 4.2 4.4  CL 104 103  CO2 26 24  GLUCOSE 117* 113*  BUN 11 11  CREATININE 0.87 0.84  CALCIUM 8.6 9.2    Liver Function Tests:  Recent Labs Lab 12/24/13 1630  AST 15  ALT 12  ALKPHOS 92  BILITOT 0.3  PROT 6.3  ALBUMIN 3.8   No results found for this basename: LIPASE, AMYLASE,  in the last 168 hours No results found for this basename: AMMONIA,  in the last 168 hours  CBC:  Recent Labs Lab 12/24/13 1630 12/25/13 0422  WBC 6.7 7.9  NEUTROABS 2.9  --   HGB 13.8 14.8  HCT 41.6 44.6  MCV 92.0 89.7  PLT 240 270    Cardiac Enzymes: No results found for this basename: CKTOTAL, CKMB, CKMBINDEX, TROPONINI,  in the last 168 hours  Lipid Panel: No results found for this basename: CHOL, TRIG, HDL, CHOLHDL, VLDL, LDLCALC,  in the last 168 hours  CBG:  Recent Labs Lab 12/27/13 0749  GLUCAP 127*    Microbiology: No results found for this or any previous visit.  Coagulation Studies: No results found for this basename: LABPROT, INR,  in the last 72 hours  Imaging: No results found.     MEDICATIONS                                                                                                                        Scheduled: . enoxaparin (LOVENOX) injection  40 mg Subcutaneous Q24H  . gabapentin  100 mg Oral TID  . nicotine  14 mg Transdermal QHS  . pantoprazole  40 mg Oral Daily    ASSESSMENT/PLAN:                                                                                                            28 year old man with findings consistent with multiple sclerosis with no previous medical documentation, presenting with acute MS exacerbation involving cervical spinal cord as  described above. Patient has finished  Solu-Medrol 500 mg IV every 12 hours for total of 6 doses.  Recommendations:  1. Protonix 40 mg per day.  2. Physical therapy and occupational therapy intervention.  3. Steroid taper with prednisone starting at 60 mg per day and tapering by 10 mg per day   4. Will need out patient neurology follow up at discharge.   Neurology S/O   Assessment and plan discussed with with attending physician and they are in agreement.  Gedeon Brandow PA-C [facility] [phone]  12/27/2013, 11:05 AM

## 2013-12-27 NOTE — Progress Notes (Signed)
After 3 failed attempts by the nurse tech to get adequate amount of blood for the 0000 blood glucose finger stick, patient refused stating he will "try again in the am." He verbally stated he was not a "fan of needles". Will pass off to am nurse.

## 2013-12-27 NOTE — Progress Notes (Signed)
Subjective: Overall, patient feels like his symptoms are improving. He reports complete resolution of sensation changes in his right upper extremities and minor sensation changes in his left hand. He also notes complete resolution of symptoms in the left thigh, right thigh, and right proximal leg, with minor symptoms still present in the left leg, left foot, and right foot. He has been walking a significant amount up and down stairs throughout the hospital to avoid requiring heparin shots. Denies changes in vision and incontinence.  Objective: Vital signs in last 24 hours: Filed Vitals:   12/26/13 1044 12/26/13 1438 12/26/13 2103 12/27/13 0544  BP: 111/49 121/52 120/64 110/60  Pulse: 82 77 84 62  Temp: 98.6 F (37 C) 98.2 F (36.8 C) 97.4 F (36.3 C) 97.8 F (36.6 C)  TempSrc: Oral Oral Oral Oral  Resp: 18 18 18 18   Height:      Weight:      SpO2: 100% 100% 99% 98%   Weight change:  No intake or output data in the 24 hours ending 12/27/13 1004 BP 110/60  Pulse 62  Temp(Src) 97.8 F (36.6 C) (Oral)  Resp 18  Ht 5\' 7"  (1.702 m)  Wt 74.345 kg (163 lb 14.4 oz)  BMI 25.66 kg/m2  SpO2 98% General appearance: alert, cooperative and no distress Lungs: clear to auscultation bilaterally Heart: regular rate and rhythm, S1, S2 normal, no murmur, click, rub or gallop Extremities: extremities normal, atraumatic, no cyanosis or edema Skin: Skin color, texture, turgor normal. No rashes or lesions Neurologic: Alert and oriented X 3, normal strength and tone, sensation differences in LLE up to knee and left hand.   Lab Results: Basic Metabolic Panel:  Recent Labs Lab 12/24/13 1630 12/25/13 0422  NA 140 139  K 4.2 4.4  CL 104 103  CO2 26 24  GLUCOSE 117* 113*  BUN 11 11  CREATININE 0.87 0.84  CALCIUM 8.6 9.2   Liver Function Tests:  Recent Labs Lab 12/24/13 1630  AST 15  ALT 12  ALKPHOS 92  BILITOT 0.3  PROT 6.3  ALBUMIN 3.8   CBC:  Recent Labs Lab 12/24/13 1630  12/25/13 0422  WBC 6.7 7.9  NEUTROABS 2.9  --   HGB 13.8 14.8  HCT 41.6 44.6  MCV 92.0 89.7  PLT 240 270   CBG:  Recent Labs Lab 12/27/13 0749  GLUCAP 127*   Urine Drug Screen: Drugs of Abuse     Component Value Date/Time   LABOPIA NONE DETECTED 12/24/2013 1700   COCAINSCRNUR NONE DETECTED 12/24/2013 1700   LABBENZ NONE DETECTED 12/24/2013 1700   AMPHETMU NONE DETECTED 12/24/2013 1700   THCU NONE DETECTED 12/24/2013 1700   LABBARB NONE DETECTED 12/24/2013 1700    Urinalysis:  Recent Labs Lab 12/24/13 1700  COLORURINE YELLOW  LABSPEC 1.028  PHURINE 6.5  GLUCOSEU NEGATIVE  HGBUR NEGATIVE  BILIRUBINUR NEGATIVE  KETONESUR NEGATIVE  PROTEINUR NEGATIVE  UROBILINOGEN 1.0  NITRITE NEGATIVE  LEUKOCYTESUR NEGATIVE   Medications:  Scheduled Meds: . enoxaparin (LOVENOX) injection  40 mg Subcutaneous Q24H  . gabapentin  100 mg Oral TID  . methylPREDNISolone (SOLU-MEDROL) injection  500 mg Intravenous Q12H  . nicotine  14 mg Transdermal QHS  . pantoprazole  40 mg Oral Daily   Continuous Infusions:  PRN Meds:.acetaminophen, acetaminophen, ondansetron (ZOFRAN) IV, ondansetron  Assessment/Plan: Principal Problem:   Multiple sclerosis  Multiple sclerosis: Diagnosed based on clinical presentation physical examination findings. MRI is very classic for multiple sclerosis. No signs of infection and  we do not suspect meningitis. Acute stroke has been excluded based on brain imaging. However, other differentials including nutritional deficiencies like, thiamine, and vitamin B12 cannot be excluded at this time. MR brain and cervical spine demonstrating signal abnormality most compatible with multiple sclerosis with acute demyelination of the spinal cord at the C5 level. Has been evaluated by PT and OT adn they do not recommend further follow up for him.  Plan  - Neurology consult, appreciate recs  - Solumedrol 500mg  IV Q12hr for 6 doses (completed 5 of 6), then prednisone taper  -  gabapentin 100 mg 3 times a day  - Protonix 40mg  daily  - Patient has no health insurance. [facility] will see him if he is able to obtain [facility] financial assistance. [facility] and will submit application. Case management also recommended that [facility] given his financial constraints.   - [facility] to establish PCP for his hospital followup. During this same visit, he will consider applying for orange card.   - Provided patient and his family with information regarding MS   FEN: regular diet   DVT ppx: lovenox 40mg  daily   This is a Psychologist, occupationalMedical Student Note.  The care of the patient was discussed with Dr. Zada GirtKazibwe and the assessment and plan formulated with their assistance.  Please see their attached note for official documentation of the daily encounter.   LOS: 3 days   Mike Lowery, Med Student 12/27/2013, 10:04 AM

## 2014-01-01 ENCOUNTER — Ambulatory Visit: Payer: Self-pay | Attending: Internal Medicine | Admitting: Internal Medicine

## 2014-01-01 ENCOUNTER — Encounter: Payer: Self-pay | Admitting: Internal Medicine

## 2014-01-01 VITALS — BP 111/65 | HR 59 | Temp 98.1°F | Resp 16 | Ht 67.0 in | Wt 165.0 lb

## 2014-01-01 DIAGNOSIS — G35 Multiple sclerosis: Secondary | ICD-10-CM | POA: Insufficient documentation

## 2014-01-01 DIAGNOSIS — F17200 Nicotine dependence, unspecified, uncomplicated: Secondary | ICD-10-CM | POA: Insufficient documentation

## 2014-01-01 LAB — NEUROMYELITIS OPTICA AUTOAB, IGG: NMO-IgG: NEGATIVE

## 2014-01-01 MED ORDER — PREDNISOLONE 5 MG PO TABS: 10.0000 mg | ORAL_TABLET | Freq: Every day | ORAL | Status: DC

## 2014-01-01 NOTE — Patient Instructions (Signed)

## 2014-01-01 NOTE — Progress Notes (Signed)
Patient ID: Mike Lowery, male   DOB: 1985/11/23, 28 y.o.   MRN: [id number]   Mike Lowery, is a 28 y.o. male  NBV:[id number]  DTH:[id number]  DOB - 03-21-1986  CC:  Chief Complaint  Patient presents with  . Establish Care  . Multiple Sclerosis       HPI: Mike Lowery is a 28 y.o. male here today to establish medical care. He has no significant past medical history, who presented to the ED on 12/24/2013 with left-sided numbness 1-week, he endorsed fatigue, urinary incontinence and paresthesia. He had similar symptoms about 4-5 months prior to this new presentation, he was evaluated and was diagnosed with multiple sclerosis based on his clinical presentation with the relapsing remitting nature and MRI findings fulfilling McDonald criteria. MR brain and cervical spine demonstrated signal abnormality most compatible with multiple sclerosis affecting the brain and upper spinal cord, with evidence of acute demyelination in the spinal cord at the C5 level. There were no signs of infection or nutritional deficiencies. He was managed with IV methylprednisolone and discharged on tapered dose of steroid. Patient is getting better slowly, and he denies any family history of neurologic problems, no sick contact. Patient has No headache, No chest pain, No abdominal pain - No Nausea, No new weakness tingling or numbness, No Cough - SOB.  No Known Allergies Past Medical History  Diagnosis Date  . Multiple sclerosis 12/24/13   Current Outpatient Prescriptions on File Prior to Visit  Medication Sig Dispense Refill  . pantoprazole (PROTONIX) 40 MG tablet Take 1 tablet (40 mg total) by mouth daily.  14 tablet  0   No current facility-administered medications on file prior to visit.   History reviewed. No pertinent family history. History   Social History  . Marital Status: Single    Spouse Name: N/A    Number of Children: N/A  . Years of Education: N/A   Occupational History  . Not on file.    Social History Main Topics  . Smoking status: Current Every Day Smoker    Types: Cigarettes  . Smokeless tobacco: Not on file  . Alcohol Use: Yes     Comment: 8 oz liquor q other day  . Drug Use: Yes    Special: Marijuana  . Sexual Activity: Not on file   Other Topics Concern  . Not on file   Social History Narrative  . No narrative on file    Review of Systems: Constitutional: Negative for fever, chills, diaphoresis, activity change, appetite change and fatigue. HENT: Negative for ear pain, nosebleeds, congestion, facial swelling, rhinorrhea, neck pain, neck stiffness and ear discharge.  Eyes: Negative for pain, discharge, redness, itching and visual disturbance. Respiratory: Negative for cough, choking, chest tightness, shortness of breath, wheezing and stridor.  Cardiovascular: Negative for chest pain, palpitations and leg swelling. Gastrointestinal: Negative for abdominal distention. Genitourinary: Negative for dysuria, urgency, frequency, hematuria, flank pain, decreased urine volume, difficulty urinating and dyspareunia.  Musculoskeletal: Negative for back pain, joint swelling, arthralgia and gait problem. Neurological: Negative for dizziness, tremors, seizures, syncope, facial asymmetry, speech difficulty Hematological: Negative for adenopathy. Does not bruise/bleed easily. Psychiatric/Behavioral: Negative for hallucinations, behavioral problems, confusion, dysphoric mood, decreased concentration and agitation.    Objective:   Filed Vitals:   01/01/14 0919  BP: 111/65  Pulse: 59  Temp: 98.1 F (36.7 C)  Resp: 16    Physical Exam: Constitutional: Patient appears well-developed and well-nourished. No distress. HENT: Normocephalic, atraumatic, External right and left ear normal. Oropharynx  is clear and moist.  Eyes: Conjunctivae and EOM are normal. PERRLA, no scleral icterus. Neck: Normal ROM. Neck supple. No JVD. No tracheal deviation. No thyromegaly. CVS: RRR,  S1/S2 +, no murmurs, no gallops, no carotid bruit.  Pulmonary: Effort and breath sounds normal, no stridor, rhonchi, wheezes, rales.  Abdominal: Soft. BS +, no distension, tenderness, rebound or guarding.  Musculoskeletal: Normal range of motion. No edema and no tenderness.  Lymphadenopathy: No lymphadenopathy noted, cervical, inguinal or axillary Neuro: Alert and oriented x3, mildly reduced strength of left upper extremity and left lower extremity. There is normal strength on the right side. Normal reflexes, muscle tone coordination. No cranial nerve deficit. Skin: Skin is warm and dry. No rash noted. Not diaphoretic. No erythema. No pallor. Psychiatric: Normal mood and affect. Behavior, judgment, thought content normal.  Lab Results  Component Value Date   WBC 7.9 12/25/2013   HGB 14.8 12/25/2013   HCT 44.6 12/25/2013   MCV 89.7 12/25/2013   PLT 270 12/25/2013   Lab Results  Component Value Date   CREATININE 0.84 12/25/2013   BUN 11 12/25/2013   NA 139 12/25/2013   K 4.4 12/25/2013   CL 103 12/25/2013   CO2 24 12/25/2013    No results found for this basename: HGBA1C   Lipid Panel  No results found for this basename: chol, trig, hdl, cholhdl, vldl, ldlcalc       Assessment and plan:   1. Multiple sclerosis  - Ambulatory referral to Neurology - Ambulatory referral to Physical Therapy  Patient was educated extensively about his new diagnosis and possible course Patient was counseled extensively about nutrition and exercise   Return in about 3 months (around 04/03/2014), or if symptoms worsen or fail to improve, for Routine Follow Up, Multiple Sclerosis.  The patient was given clear instructions to go to ER or return to medical center if symptoms don't improve, worsen or new problems develop. The patient verbalized understanding. The patient was told to call to get lab results if they haven't heard anything in the next week.     This note has been created with Engineer, agriculturalDragon speech  recognition software and smart phrase technology. Any transcriptional errors are unintentional.    Jeanann LewandowskyJEGEDE, Aidenjames Heckmann, MD, MHA, FACP, FAAP [facility] [place], [place] [phone]   01/01/2014, 10:13 AM

## 2014-01-01 NOTE — Progress Notes (Signed)
Patient presents to establish care HFU for multiple sclerosis. C/O left hand pain; rates 5/10 at present States this is down from 8/10 prior to starting prednisolone. Mother present

## 2014-01-25 ENCOUNTER — Ambulatory Visit: Payer: Self-pay | Attending: Internal Medicine

## 2014-02-05 ENCOUNTER — Ambulatory Visit: Payer: Self-pay | Admitting: Neurology

## 2014-02-05 ENCOUNTER — Telehealth: Payer: Self-pay | Admitting: Neurology

## 2014-02-05 NOTE — Telephone Encounter (Signed)
Pt came in the office and only had the orange card. I informed him that he could be self pay but he stated that he had No income at the moment. He has filled out [employer] but he has not been approved yet do to him needing to send Out documents before being approved. I spoke w/ Angelia from [employer] and she is going to mail Him letter stating what all he needs to supply. Pt will call later to r/s once he is approved.

## 2014-04-13 ENCOUNTER — Encounter (HOSPITAL_COMMUNITY): Payer: Self-pay | Admitting: *Deleted

## 2014-04-13 ENCOUNTER — Emergency Department (HOSPITAL_COMMUNITY)
Admission: EM | Admit: 2014-04-13 | Discharge: 2014-04-13 | Disposition: A | Discharge status: Home / Self Care | Payer: Self-pay | Attending: Emergency Medicine | Admitting: Emergency Medicine

## 2014-04-13 DIAGNOSIS — G35 Multiple sclerosis: Secondary | ICD-10-CM | POA: Insufficient documentation

## 2014-04-13 DIAGNOSIS — Z7952 Long term (current) use of systemic steroids: Secondary | ICD-10-CM | POA: Insufficient documentation

## 2014-04-13 DIAGNOSIS — Z720 Tobacco use: Secondary | ICD-10-CM | POA: Insufficient documentation

## 2014-04-13 DIAGNOSIS — Z79899 Other long term (current) drug therapy: Secondary | ICD-10-CM | POA: Insufficient documentation

## 2014-04-13 MED ORDER — METHYLPREDNISOLONE SODIUM SUCC 125 MG IJ SOLR
125.0000 mg | Freq: Once | INTRAMUSCULAR | Status: AC
  Administered 2014-04-13: 125 mg via INTRAVENOUS
  Filled 2014-04-13: qty 2

## 2014-04-13 MED ORDER — PREDNISONE 20 MG PO TABS: ORAL_TABLET | ORAL | Status: DC

## 2014-04-13 NOTE — Progress Notes (Signed)
  CARE MANAGEMENT ED NOTE 04/13/2014  Patient:  Mike Lowery, Mike Lowery   Account Number:  [id number]  Date Initiated:  04/13/2014  Documentation initiated by:  Edd Arbour  Subjective/Objective Assessment:   28 yr old self pay [place] pt c/o bilateral hand numbness and spasms to the left side of his face, pt states his eye is always twitching and his left cheek is tight, history of MS and states these are symptoms he has from this     Subjective/Objective Assessment Detail:   pcp [facility]     Action/Plan:   ED CM received a call from ED RN inquiring where pt could get Rx filled other than [facility]. It is reported that [facility] is closed for holiday Pcp updated in EPIC   Action/Plan Detail:   CM referred pt [facility] for discounted cost of Prednisone med referred to [email or web address] Requested RN to share this website with self pay pt to allow him to have a choice of discounted pharmacies ([facility], [facility], [facility], etc) for prednisone   Anticipated DC Date:  04/13/2014     Status Recommendation to Physician:   Result of Recommendation:    Other ED Services  Consult Working Plan    DC Planning Services  Other  Outpatient Services - Pt will follow up  PCP issues  Medication Assistance    Choice offered to / List presented to:            Status of service:  Completed, signed off  ED Comments:   ED Comments Detail:

## 2014-04-13 NOTE — ED Notes (Signed)
Pt in c/o bilateral hand numbness and spasms to the left side of his face, pt states his eye is always twitching and his left cheek is tight, history of MS and states these are symptoms he has from this

## 2014-04-13 NOTE — ED Provider Notes (Signed)
CSN: [id number]     Arrival date & time 04/13/14  [id number] History   First MD Initiated Contact with Patient 04/13/14 0957     Chief Complaint  Patient presents with  . Numbness  . Spasms     (Consider location/radiation/quality/duration/timing/severity/associated sxs/prior Treatment) HPI   28 year old male with history of multiple sclerosis who presents for evaluation of bilateral hand numbness and spasms on the left side of his face. Patient states he was diagnosed with multiple sclerosis in August of this year when he developed tingling sensation to both hands and L feet.  Patient states he was treated with medication that seems to improve his symptoms but he always has tearing sensation to left hand and feet. For the past week the tearing sensation has intensify. He also now complaining of spasm to the left side of face with tightness to his left cheek. He denies having any significant fever, chills, headache, double vision, blurred vision, URI symptoms, chest pain or shortness of breath, productive cough, abdominal pain, new weakness, or rash. He does not have a neurologist.  Denies any increased stress. Patient is in the process of trying to get disability.  Past Medical History  Diagnosis Date  . Multiple sclerosis 12/24/13   No past surgical history on file. No family history on file. History  Substance Use Topics  . Smoking status: Current Every Day Smoker    Types: Cigarettes  . Smokeless tobacco: Not on file  . Alcohol Use: Yes     Comment: 8 oz liquor q other day    Review of Systems  All other systems reviewed and are negative.     Allergies  Review of patient's allergies indicates no known allergies.  Home Medications   Prior to Admission medications   Medication Sig Start Date End Date Taking? Authorizing Provider  pantoprazole (PROTONIX) 40 MG tablet Take 1 tablet (40 mg total) by mouth daily. 12/27/13   Dow Adolphichard Kazibwe, MD  prednisoLONE 5 MG TABS tablet Take 2  tablets (10 mg total) by mouth daily. 01/01/14   Quentin Angstlugbemiga E Jegede, MD   BP 124/64 mmHg  Pulse 80  Temp(Src) 98 F (36.7 C) (Oral)  Resp 20  SpO2 100% Physical Exam  Constitutional: He is oriented to person, place, and time. He appears well-developed and well-nourished. No distress.  HENT:  Head: Atraumatic.  Eyes: Conjunctivae and EOM are normal. Pupils are equal, round, and reactive to light.  Visual Acuity - Bilateral Near: 20/25 ; R Near: 20/25 ; L Near: 20/30  Neck: Normal range of motion. Neck supple.  Cardiovascular: Normal rate and regular rhythm.   Pulmonary/Chest: Effort normal and breath sounds normal.  Abdominal: Soft. There is no tenderness.  Neurological: He is alert and oriented to person, place, and time.  Neurologic exam:  Speech clear, pupils equal round reactive to light, extraocular movements intact  Mild L proptosis with normal peripheral visual fields Cranial nerves III through XII normal with mild L side facial droop Follows commands, moves all extremities x4, normal strength to bilateral upper and lower extremities at all major muscle groups including grip Sensation normal to light touch  Coordination intact, no limb ataxia, finger-nose-finger normal Rapid alternating movements normal No pronator drift Gait normal   Skin: No rash noted.  Psychiatric: He has a normal mood and affect.  Nursing note and vitals reviewed.   ED Course  Procedures (including critical care time)  10:28 AM Patient with an MS flare, likely benefiting from a course of  steroid and outpatient follow-up with [facility] neurology for further management. Patient otherwise stable for discharge. No true stroke symptom and he has no pain. He is able to ambulate, no respiratory compromise.  No change of vision from baseline according to pt.  Will give solumedrol in ER and pt will be d/c with course of steroid.  Care discussed with Dr. Patria Mane.  Pt agrees with plan.    Labs Review Labs  Reviewed - No data to display  Imaging Review No results found.   EKG Interpretation None      MDM   Final diagnoses:  Exacerbation of multiple sclerosis    BP 124/64 mmHg  Pulse 80  Temp(Src) 98 F (36.7 C) (Oral)  Resp 20  SpO2 100%     Fayrene Helper, PA-C 04/13/14 1108  Lyanne Co, MD 04/13/14 1113

## 2014-04-13 NOTE — Discharge Instructions (Signed)
[facility] for further management of your multiple sclerosis exacerbation.  Take steroid as prescribed  Multiple Sclerosis Multiple sclerosis (MS) is a disease of the central nervous system. It leads to the loss of the insulating covering of the nerves (myelin sheath) of your brain. When this happens, brain signals do not get sent properly or may not get sent at all. The age of onset of MS varies.  CAUSES The cause of MS is unknown. However, it is more common in the [place] than in the [place]. RISK FACTORS There is a higher number of women with MS than men. MS is not an illness that is passed down to you from your family members (inherited). However, your risk of MS is higher if you have a relative with MS. SIGNS AND SYMPTOMS  The symptoms of MS occur in episodes or attacks. These attacks may last weeks to months. There may be long periods of almost no symptoms between attacks. The symptoms of MS vary. This is because of the many different ways it affects the central nervous system. The main symptoms of MS include:  Vision problems and eye pain.  Numbness.  Weakness.  Inability to move your arms, hands, feet, or legs (paralysis).  Balance problems.  Tremors. DIAGNOSIS  Your health care provider can diagnose MS with the help of imaging exams and lab tests. These may include specialized X-ray exams and spinal fluid tests. The best imaging exam to confirm a diagnosis of MS is an MRI. TREATMENT  There is no known cure for MS, but there are medicines that can decrease the number and frequency of attacks. Steroids are often used for short-term relief. Physical and occupational therapy may also help. There are also many new alternative or complementary treatments available to help control the symptoms of MS. Ask your health care provider if any of these other options are right for you. HOME CARE INSTRUCTIONS   Take medicines as  directed by your health care provider.  Exercise as directed by your health care provider. SEEK MEDICAL CARE IF: You begin to feel depressed. SEEK IMMEDIATE MEDICAL CARE IF:  You develop paralysis.  You have problems with bladder, bowel, or sexual function.  You develop mental changes, such as forgetfulness or mood swings.  You have a period of uncontrolled movements (seizure). Document Released: 05/01/2000 Document Revised: 05/09/2013 Document Reviewed: 01/09/2013 [facility] Patient Information 2015 [place] [place]. This information is not intended to replace advice given to you by your health care provider. Make sure you discuss any questions you have with your health care provider.

## 2014-04-23 ENCOUNTER — Telehealth: Payer: Self-pay | Admitting: Internal Medicine

## 2014-04-23 NOTE — Telephone Encounter (Signed)
Pt.'s wife is calling to request a refill on predniSONE (DELTASONE) 20 MG tablet, pt's wife states that the pt.has been out for 2 days and needs enough medication to last until the pt's next appt. Please f/u with pt.

## 2014-04-25 NOTE — Telephone Encounter (Signed)
Expand All Collapse All   Pt.'s wife is calling to request a refill on predniSONE (DELTASONE) 20 MG tablet, pt's wife states that the pt.has been out for 2 days and needs enough medication to last until the pt's next appt. Please f/u with pt.

## 2014-05-06 ENCOUNTER — Encounter (HOSPITAL_COMMUNITY): Payer: Self-pay | Admitting: Nurse Practitioner

## 2014-05-06 ENCOUNTER — Emergency Department (HOSPITAL_COMMUNITY)
Admission: EM | Admit: 2014-05-06 | Discharge: 2014-05-06 | Disposition: A | Discharge status: Home / Self Care | Payer: Self-pay | Attending: Emergency Medicine | Admitting: Emergency Medicine

## 2014-05-06 DIAGNOSIS — G35 Multiple sclerosis: Secondary | ICD-10-CM | POA: Insufficient documentation

## 2014-05-06 DIAGNOSIS — Z720 Tobacco use: Secondary | ICD-10-CM | POA: Insufficient documentation

## 2014-05-06 NOTE — ED Provider Notes (Signed)
CSN: [id number]     Arrival date & time 05/06/14  1548 History   First MD Initiated Contact with Patient 05/06/14 1622     Chief Complaint  Patient presents with  . Multiple Sclerosis     (Consider location/radiation/quality/duration/timing/severity/associated sxs/prior Treatment) HPI  The patient has been diagnosed with MS. He has not been able to initiate treatment yet due to financial difficulty. The patient worse he had an episode yesterday where he is legs got very weak while he was climbing the stairs. He reports at one time the legs actually just gave out from under him. He however has been ambulatory. He reports his ability to walk has not been impacted. He was ambulatory to the emergency department. At this point in time he is not having any sensory problems in his legs. He denies any problems with his bladder there's been no urinary retention or incontinence. He denies any bowel dysfunction. He reports his hands always have a pins and needles like sensation. He and his girlfriend make note of his eyes seeming kind of swollen or twitchy. He however denies any visual dysfunction. He has not had fever. His girlfriend reports that he seems to sleep a lot. They were coming in to be seen today because the patient will be able to get into a provider until the end of January. As well there are problems with getting his financial aid established and thus he has been unable to see a neurologist and initiate any treatment. Past Medical History  Diagnosis Date  . Multiple sclerosis 12/24/13  . Multiple sclerosis    History reviewed. No pertinent past surgical history. History reviewed. No pertinent family history. History  Substance Use Topics  . Smoking status: Current Every Day Smoker    Types: Cigarettes  . Smokeless tobacco: Not on file  . Alcohol Use: Yes     Comment: 8 oz liquor q other day    Review of Systems 10 Systems reviewed and are negative for acute change except as noted in  the HPI.    Allergies  Review of patient's allergies indicates no known allergies.  Home Medications   Prior to Admission medications   Medication Sig Start Date End Date Taking? Authorizing Provider  pantoprazole (PROTONIX) 40 MG tablet Take 1 tablet (40 mg total) by mouth daily. Patient not taking: Reported on 05/06/2014 12/27/13   Dow Adolph, MD  prednisoLONE 5 MG TABS tablet Take 2 tablets (10 mg total) by mouth daily. Patient not taking: Reported on 05/06/2014 01/01/14   Quentin Angst, MD  predniSONE (DELTASONE) 20 MG tablet 3 Tabs PO Days 1-3, then 2 tabs PO Days 4-6, then 1 tab PO Day 7-9, then Half Tab PO Day 10-12 Patient not taking: Reported on 05/06/2014 04/13/14   Fayrene Helper, PA-C   BP 104/47 mmHg  Pulse 64  Temp(Src) 97.6 F (36.4 C) (Oral)  Resp 16  SpO2 99% Physical Exam  Constitutional: He is oriented to person, place, and time. He appears well-developed and well-nourished.  HENT:  Head: Normocephalic and atraumatic.  Eyes: EOM are normal. Pupils are equal, round, and reactive to light.  Neck: Neck supple.  Cardiovascular: Normal rate, regular rhythm, normal heart sounds and intact distal pulses.   Pulmonary/Chest: Effort normal and breath sounds normal.  Abdominal: Soft. Bowel sounds are normal. He exhibits no distension. There is no tenderness.  Musculoskeletal: Normal range of motion. He exhibits no edema.  Neurological: He is alert and oriented to person, place, and time.  He has normal strength. No cranial nerve deficit. He exhibits normal muscle tone. Coordination normal. GCS eye subscore is 4. GCS verbal subscore is 5. GCS motor subscore is 6.  The patient has normal heel shin examination he has normal rapid alternating movements with finger-nose examination. He has 5 out of 5 upper and lower motor strength. He has intact sensation to light touch throughout. Extraocular motions are intact. Pupillary reflexes are symmetric.  Skin: Skin is warm, dry  and intact.  Psychiatric: He has a normal mood and affect.    ED Course  Procedures (including critical care time) Labs Review Labs Reviewed - No data to display  Imaging Review No results found.   EKG Interpretation None     Consult (15:52):Dr. Reynolds cannot start DM medications without neurology follow up in place. Recurrent steroids not helpful. Dr. Thad Rangereynolds did come to the emergency department and review this with the family and the patient. MDM   Final diagnoses:  Multiple sclerosis exacerbation   At this point the patient is not having active neurologic dysfunction due to MS. He and his family members came to the emergency department wanting to get some therapy started due to the difficulty they're experiencing in the outpatient setting with initiating treatment. I did consult Dr. Thad Rangereynolds to help educate the patient and family on the appropriate treatment plan. They are disappointed and upset that no medication will be initiated through the emergency department. Dr. Thad Rangereynolds however has come in person and explained to them the risks of initiating disease modification medications without follow-up in place as well as the fact that ongoing steroids are not indicated.    Arby BarretteMarcy Deaunte Dente, MD 05/06/14 [phone]

## 2014-05-06 NOTE — ED Notes (Signed)
Patient calm and signing for discharge. On his cell phone and walking around room in no acute distress. Patient's mother is cursing, making threatening statements, and very upset that he is being discharged. Patient agrees to follow up with his regular doctor.

## 2014-05-06 NOTE — Discharge Instructions (Signed)

## 2014-05-06 NOTE — ED Notes (Signed)
He states hes been having an MS exacerbation since end of November. He has an appt with PCP but not until 12/28. He reports his MS symptoms are getting worse, yesterday his legs "gave out" and his eyes are swollen. He is ambulatory and A&Ox4 in triage.

## 2014-05-06 NOTE — ED Notes (Signed)
Mike Lowery, Neurology MD at bedside.

## 2014-05-07 ENCOUNTER — Other Ambulatory Visit: Payer: Self-pay

## 2014-05-07 MED ORDER — PREDNISOLONE 5 MG PO TABS: 10.0000 mg | ORAL_TABLET | Freq: Every day | ORAL | Status: DC

## 2014-05-14 ENCOUNTER — Encounter: Payer: Self-pay | Admitting: Internal Medicine

## 2014-05-14 ENCOUNTER — Ambulatory Visit: Payer: Self-pay | Attending: Internal Medicine

## 2014-05-14 ENCOUNTER — Ambulatory Visit (HOSPITAL_BASED_OUTPATIENT_CLINIC_OR_DEPARTMENT_OTHER): Payer: Self-pay | Admitting: Internal Medicine

## 2014-05-14 VITALS — BP 113/68 | HR 84 | Temp 98.0°F | Resp 16 | Wt 152.4 lb

## 2014-05-14 DIAGNOSIS — G35 Multiple sclerosis: Secondary | ICD-10-CM | POA: Insufficient documentation

## 2014-05-14 DIAGNOSIS — M7989 Other specified soft tissue disorders: Secondary | ICD-10-CM | POA: Insufficient documentation

## 2014-05-14 DIAGNOSIS — R200 Anesthesia of skin: Secondary | ICD-10-CM | POA: Insufficient documentation

## 2014-05-14 DIAGNOSIS — R202 Paresthesia of skin: Secondary | ICD-10-CM

## 2014-05-14 MED ORDER — METHYLPREDNISOLONE SODIUM SUCC 125 MG IJ SOLR
125.0000 mg | Freq: Once | INTRAMUSCULAR | Status: AC
  Administered 2014-05-14: 125 mg via INTRAMUSCULAR

## 2014-05-14 MED ORDER — PREDNISONE 20 MG PO TABS: ORAL_TABLET | ORAL | Status: DC

## 2014-05-14 NOTE — Progress Notes (Signed)
Patient ID: Mike Lowery, male   DOB: Sep 12, 1985, 28 y.o.   MRN: [id number]   Mike Lowery, is a 28 y.o. male  [id number]  [id number]  DOB - Sep 12, 1985  Chief Complaint  Patient presents with  . Follow-up        Subjective:   Mike Lowery is a 28 y.o. male here today for a follow up visit. Patient has been diagnosed with multiple sclerosis but has not been able to start biology medication from the neurologist due to financial difficulty. He was seen recently in the ED for multiple sclerosis flareup, was given few tablets of prednisone twice according to patient which only help for a few days and now his symptoms are back. His symptoms include numbness in both hands and about 2 locations his legs give up on him last week and he fell but no head injury. Patient is ambulatory, no loss of bowel or bladder control, no urinary retention. Another symptom he notices twitching of the left eye, no swelling or redness. Patient has No headache, No chest pain, No abdominal pain - No Nausea, No new weakness tingling or numbness, No Cough - SOB.  Problem  Swelling of Both Hands    ALLERGIES: No Known Allergies  PAST MEDICAL HISTORY: Past Medical History  Diagnosis Date  . Multiple sclerosis 12/24/13  . Multiple sclerosis     MEDICATIONS AT HOME: Prior to Admission medications   Medication Sig Start Date End Date Taking? Authorizing Provider  pantoprazole (PROTONIX) 40 MG tablet Take 1 tablet (40 mg total) by mouth daily. Patient not taking: Reported on 05/06/2014 12/27/13   Dow Adolphichard Kazibwe, MD  prednisoLONE 5 MG TABS tablet Take 2 tablets (10 mg total) by mouth daily. 05/07/14   Quentin Angstlugbemiga E Divina Neale, MD  predniSONE (DELTASONE) 20 MG tablet 3 Tabs PO daily for 5 days (days 1 - 5), then 2 tabs PO days (days 6 - 10), then 1 tab PO daily for 10 days (days 11 - 20), then Half Tab PO daily from day 21 05/14/14   Quentin Angstlugbemiga E Jahniah Pallas, MD     Objective:   Filed Vitals:   05/14/14 1250    BP: 113/68  Pulse: 84  Temp: 98 F (36.7 C)  Resp: 16  Weight: 152 lb 6.4 oz (69.128 kg)  SpO2: 100%    Exam General appearance : Awake, alert, not in any distress. Speech Clear. Not toxic looking HEENT: Atraumatic and Normocephalic, pupils equally reactive to light and accomodation Neck: supple, no JVD. No cervical lymphadenopathy.  Chest:Good air entry bilaterally, no added sounds  CVS: S1 S2 regular, no murmurs.  Abdomen: Bowel sounds present, Non tender and not distended with no gaurding, rigidity or rebound. Extremities: B/L Lower Ext shows no edema, both legs are warm to touch Neurology: Awake alert, and oriented X 3, CN II-XII intact, Non focal Skin:No Rash Wounds:N/A  Data Review No results found for: HGBA1C   Assessment & Plan   1. Numbness of both hands due to multiple sclerosis  - methylPREDNISolone sodium succinate (SOLU-MEDROL) 125 mg/2 mL injection 125 mg; Inject 2 mLs (125 mg total) into the muscle once. - Tapered Dose of prednisone prescribed Patient was educated about multiple sclerosis and possible expectations  2. Multiple sclerosis  - Ambulatory referral to Neurology  Patient was counseled extensively about nutrition and exercise  Patient has appointment with financial aid specialist today in our clinic.  Return in about 3 months (around 08/13/2014), or if symptoms worsen or fail to improve,  for Multiple Sclerosis.  The patient was given clear instructions to go to ER or return to medical center if symptoms don't improve, worsen or new problems develop. The patient verbalized understanding. The patient was told to call to get lab results if they haven't heard anything in the next week.   This note has been created with Education officer, environmental. Any transcriptional errors are unintentional.    Jeanann Lewandowsky, MD, MHA, FACP, FAAP [facility] [place], [place] [phone]    05/14/2014, 1:06 PM

## 2014-05-14 NOTE — Progress Notes (Signed)
Patient here for follow up Complains of bilateral hand swelling States legs gave out last week Complains of twitching to both eye lids Requesting refill on his prednisone

## 2014-05-14 NOTE — Patient Instructions (Signed)

## 2014-06-11 ENCOUNTER — Ambulatory Visit: Payer: Self-pay

## 2014-06-26 ENCOUNTER — Ambulatory Visit (INDEPENDENT_AMBULATORY_CARE_PROVIDER_SITE_OTHER): Payer: Self-pay | Admitting: Neurology

## 2014-06-26 ENCOUNTER — Encounter: Payer: Self-pay | Admitting: Neurology

## 2014-06-26 VITALS — BP 112/60 | HR 84 | Temp 97.8°F | Resp 20 | Ht 67.0 in | Wt 154.7 lb

## 2014-06-26 DIAGNOSIS — G35 Multiple sclerosis: Secondary | ICD-10-CM

## 2014-06-26 MED ORDER — BACLOFEN 10 MG PO TABS: 10.0000 mg | ORAL_TABLET | Freq: Three times a day (TID) | ORAL | Status: DC | PRN

## 2014-06-26 NOTE — Progress Notes (Signed)
NEUROLOGY CONSULTATION NOTE  Mike Lowery MRN: [id number] DOB: 1985-09-20  Referring provider: Dr. Hyman Hopes Primary care provider: Dr. Hyman Hopes  Reason for consult:  MS  HISTORY OF PRESENT ILLNESS: Mike Lowery is a 29 year old right-handed man with no significant past medical history who presents to establish care regarding multiple sclerosis.  Records and images personally reviewed.  He presented to [facility]13 for left sided numbness and weakness of one week duration.  He developed some mild weakness in the hands and feet and then the numbness progressed to involve the left side of his body, reportedly up to the upper chest.  He then developed numbness in the toes of his right foot, radiating up to the mid-thigh.    He had a similar episode about 4-5 months prior, which resolved spontaneously after a few days.  He was admitted and underwent an MS workup.  MRI of the brain with and without contrast revealed multifocal hyperintense lesions involving the cerebral white matter near the lateral ventricles oriented perpendicular and bilateral temporal lobes.  There was no abnormal enhancement.  There was mild thinning of the anterior body of the corpus callosum.  MRI of the cervical spine revealed multiple hyperintense lesions, including a segment from C3-C4 to C5-C6 level with subtle enhancement.  There is a non-enhancing plaque from C6-C7 to T1-T2 and subtle hyperintense lesion at C2-3.  Blood work included NMO-IgG negative, HIV negative, normal CBC and BMP.  He was given IV Solumedrol  twice daily for 3 days and discharged with a prednisone taper.  Symptoms improved during his hospitalization.  Due to lack of finances, he was unable to follow up with neurology and initiate a disease modifying agent.  He has since been to the ED once for a perceived flare up and was started on prednisone.  Since his diagnosis, he feels depressed.  He still has numbness in his extremities.  His legs  gave out on him once but otherwise, he has no problems walking.  He denies bowel or bladder incontinence or retention.  He feels fatigued but he also has problems falling asleep at night.  He denies any vision abnormalities.  He says that his arms sometimes tense up and spasm.  He denies family history of MS  PAST MEDICAL HISTORY: Past Medical History  Diagnosis Date  . Multiple sclerosis 12/24/13  . Multiple sclerosis     PAST SURGICAL HISTORY: No past surgical history on file.  MEDICATIONS: Current Outpatient Prescriptions on File Prior to Visit  Medication Sig Dispense Refill  . pantoprazole (PROTONIX) 40 MG tablet Take 1 tablet (40 mg total) by mouth daily. 14 tablet 0  . prednisoLONE 5 MG TABS tablet Take 2 tablets (10 mg total) by mouth daily. 5 each 30  . predniSONE (DELTASONE) 20 MG tablet 3 Tabs PO daily for 5 days (days 1 - 5), then 2 tabs PO days (days 6 - 10), then 1 tab PO daily for 10 days (days 11 - 20), then Half Tab PO daily from day 21 60 tablet 0   No current facility-administered medications on file prior to visit.    ALLERGIES: No Known Allergies  FAMILY HISTORY: Family History  Problem Relation Age of Onset  . Cancer Maternal Grandmother     unknown     SOCIAL HISTORY: History   Social History  . Marital Status: Single    Spouse Name: N/A    Number of Children: N/A  . Years of Education: N/A  Occupational History  . Not on file.   Social History Main Topics  . Smoking status: Current Every Day Smoker    Types: Cigarettes  . Smokeless tobacco: Never Used  . Alcohol Use: 0.0 oz/week    0 Not specified per week     Comment: 8 oz liquor q other day social   . Drug Use: Yes    Special: Marijuana     Comment: former  . Sexual Activity:    Partners: Female   Other Topics Concern  . Not on file   Social History Narrative    REVIEW OF SYSTEMS: Constitutional: No fevers, chills, or sweats, no generalized fatigue, change in appetite Eyes:  No visual changes, double vision, eye pain Ear, nose and throat: No hearing loss, ear pain, nasal congestion, sore throat Cardiovascular: No chest pain, palpitations Respiratory:  No shortness of breath at rest or with exertion, wheezes GastrointestinaI: No nausea, vomiting, diarrhea, abdominal pain, fecal incontinence Genitourinary:  No dysuria, urinary retention or frequency Musculoskeletal:  As above Integumentary: No rash, pruritus, skin lesions Neurological: as above Psychiatric: No depression, insomnia, anxiety Endocrine: No palpitations, fatigue, diaphoresis, mood swings, change in appetite, change in weight, increased thirst Hematologic/Lymphatic:  No anemia, purpura, petechiae. Allergic/Immunologic: no itchy/runny eyes, nasal congestion, recent allergic reactions, rashes  PHYSICAL EXAM: Filed Vitals:   06/26/14 1409  BP: 112/60  Pulse: 84  Temp: 97.8 F (36.6 C)  Resp: 20   General: No acute distress Head:  Normocephalic/atraumatic Eyes:  fundi unremarkable, without vessel changes, exudates, hemorrhages or papilledema. Neck: supple, no paraspinal tenderness, full range of motion.  Negative Lhermitte's sign Back: No paraspinal tenderness Heart: regular rate and rhythm Lungs: Clear to auscultation bilaterally. Vascular: No carotid bruits. Neurological Exam: Mental status: alert and oriented to person, place, and time, recent and remote memory intact, fund of knowledge intact, attention and concentration intact, speech fluent and not dysarthric, language intact. Cranial nerves: CN I: not tested CN II: pupils equal, round and reactive to light, visual fields intact, fundi unremarkable, without vessel changes, exudates, hemorrhages or papilledema. CN III, IV, VI:  full range of motion, no nystagmus, no ptosis CN V: facial sensation intact CN VII: upper and lower face symmetric CN VIII: hearing intact CN IX, X: gag intact, uvula midline CN XI: sternocleidomastoid and  trapezius muscles intact CN XII: tongue midline Bulk & Tone: normal, no fasciculations. Motor:  5/5 throughout Sensation:  Reduced pinprick in left upper and lower extremities.  Reduce vibration in left lower extremity. Deep Tendon Reflexes:  3+ and symmetric in patellars, otherwise 2+.  Toes downgoing Finger to nose testing:  No dysmetria Heel to shin:  No dysmetria Gait:  Normal station and stride.  Able to turn, walk on toes, heels and in tandem. Romberg negative.  IMPRESSION: Relapsing-remitting multiple sclerosis Depression, at this time he wishes to monitor.  PLAN: 1.  Discussed in length the various disease modifying agents.  Information provided for patient to review at home and he is to contact us ASAP with his decision on what to start. 2.  Check CBC, CMP, JC virus, and vitamin D 3.  Baclofen 10mg  three times daily as needed for spasms. 4.  Follow up in 3 months.  45 minutes spent with patient and his girlfriend, over 50% discussing diagnosis and discussing in length the pros and cons of various disease modifying agents.  Thank you for allowing me to take part in the care of this patient.  Shon Millet, DO  CC:  Jeanann Lewandowsky, MD

## 2014-06-26 NOTE — Patient Instructions (Addendum)
1.  Please review the information on MS drugs and contact us immediately with your choice 2.  For spasms in the arms, you may take baclofen  up to three times daily as needed 3.  We will check blood work, CBC, CMP, JC Virus and vitamin D level 4.  Follow up in 3 months

## 2014-06-28 ENCOUNTER — Telehealth: Payer: Self-pay | Admitting: Neurology

## 2014-06-28 NOTE — Telephone Encounter (Signed)
Sent over disability determination services  To medical records

## 2014-07-04 ENCOUNTER — Ambulatory Visit: Payer: Self-pay

## 2014-07-14 ENCOUNTER — Encounter (HOSPITAL_COMMUNITY): Payer: Self-pay | Admitting: Emergency Medicine

## 2014-07-14 ENCOUNTER — Emergency Department (HOSPITAL_COMMUNITY)
Admission: EM | Admit: 2014-07-14 | Discharge: 2014-07-14 | Disposition: A | Discharge status: Home / Self Care | Payer: Self-pay | Attending: Emergency Medicine | Admitting: Emergency Medicine

## 2014-07-14 DIAGNOSIS — Z8669 Personal history of other diseases of the nervous system and sense organs: Secondary | ICD-10-CM | POA: Insufficient documentation

## 2014-07-14 DIAGNOSIS — F12122 Cannabis abuse with intoxication with perceptual disturbance: Secondary | ICD-10-CM | POA: Insufficient documentation

## 2014-07-14 DIAGNOSIS — Z7952 Long term (current) use of systemic steroids: Secondary | ICD-10-CM | POA: Insufficient documentation

## 2014-07-14 DIAGNOSIS — F12922 Cannabis use, unspecified with intoxication with perceptual disturbance: Secondary | ICD-10-CM

## 2014-07-14 DIAGNOSIS — Z79899 Other long term (current) drug therapy: Secondary | ICD-10-CM | POA: Insufficient documentation

## 2014-07-14 DIAGNOSIS — Z720 Tobacco use: Secondary | ICD-10-CM | POA: Insufficient documentation

## 2014-07-14 LAB — I-STAT CHEM 8, ED
BUN: 13 mg/dL (ref 6–23)
Calcium, Ion: 1.19 mmol/L (ref 1.12–1.23)
Chloride: 102 mmol/L (ref 96–112)
Creatinine, Ser: 0.9 mg/dL (ref 0.50–1.35)
Glucose, Bld: 78 mg/dL (ref 70–99)
HCT: 43 % (ref 39.0–52.0)
Hemoglobin: 14.6 g/dL (ref 13.0–17.0)
Potassium: 3.9 mmol/L (ref 3.5–5.1)
SODIUM: 139 mmol/L (ref 135–145)
TCO2: 22 mmol/L (ref 0–100)

## 2014-07-14 NOTE — ED Provider Notes (Signed)
CSN: [id number]     Arrival date & time 07/14/14  1433 History   First MD Initiated Contact with Patient 07/14/14 1634     Chief Complaint  Patient presents with  . Feels jittery    . Multiple Sclerosis   . Smoked weed    HPI Patient states in episode of extreme anxiety and paranoia. He was smoking marijuana approximately 30 minutes prior to the onset of the symptoms. He began to feel very jittery and anxious. Patient could not calm down. Patient is starting to feel better now. He denies any other coingestants. He denies any trouble with suicidal or homicidal ideation. He denies any fevers chills numbness or weakness.  Past Medical History  Diagnosis Date  . Multiple sclerosis 12/24/13  . Multiple sclerosis    No past surgical history on file. Family History  Problem Relation Age of Onset  . Cancer Maternal Grandmother     unknown    History  Substance Use Topics  . Smoking status: Current Every Day Smoker    Types: Cigarettes  . Smokeless tobacco: Never Used  . Alcohol Use: 0.0 oz/week    0 Standard drinks or equivalent per week     Comment: 8 oz liquor q other day social     Review of Systems  All other systems reviewed and are negative.     Allergies  Review of patient's allergies indicates no known allergies.  Home Medications   Prior to Admission medications   Medication Sig Start Date End Date Taking? Authorizing Provider  baclofen (LIORESAL) 10 MG tablet Take 1 tablet (10 mg total) by mouth 3 (three) times daily as needed for muscle spasms. 06/26/14  Yes Adam Gus Rankin, DO  pantoprazole (PROTONIX) 40 MG tablet Take 1 tablet (40 mg total) by mouth daily. Patient not taking: Reported on 07/14/2014 12/27/13   Dow Adolph, MD  prednisoLONE 5 MG TABS tablet Take 2 tablets (10 mg total) by mouth daily. Patient not taking: Reported on 07/14/2014 05/07/14   Quentin Angst, MD  predniSONE (DELTASONE) 20 MG tablet 3 Tabs PO daily for 5 days (days 1 - 5), then 2  tabs PO days (days 6 - 10), then 1 tab PO daily for 10 days (days 11 - 20), then Half Tab PO daily from day 21 Patient not taking: Reported on 07/14/2014 05/14/14   Quentin Angst, MD   BP 109/42 mmHg  Pulse 63  Temp(Src) 98.4 F (36.9 C) (Oral)  Resp 16  SpO2 97% Physical Exam  Constitutional: He appears well-developed and well-nourished. No distress.  Resting comfortably  HENT:  Head: Normocephalic and atraumatic.  Right Ear: External ear normal.  Left Ear: External ear normal.  Eyes: Conjunctivae are normal. Right eye exhibits no discharge. Left eye exhibits no discharge. No scleral icterus.  Neck: Neck supple. No tracheal deviation present.  Cardiovascular: Normal rate, regular rhythm and intact distal pulses.   Pulmonary/Chest: Effort normal and breath sounds normal. No stridor. No respiratory distress. He has no wheezes. He has no rales.  Abdominal: Soft. Bowel sounds are normal. He exhibits no distension. There is no tenderness. There is no rebound and no guarding.  Musculoskeletal: He exhibits no edema or tenderness.  Neurological: He is alert. He has normal strength. No cranial nerve deficit (no facial droop, extraocular movements intact, no slurred speech) or sensory deficit. He exhibits normal muscle tone. He displays no seizure activity. Coordination normal.  Skin: Skin is warm and dry. No rash noted.  Psychiatric: He has a normal mood and affect.  Nursing note and vitals reviewed.   ED Course  Procedures (including critical care time) Labs Review Labs Reviewed  I-STAT CHEM 8, ED    Imag  MDM   Final diagnoses:  Marijuana intoxication, with perceptual disturbance   Most likely an adverse reaction to the marijuana he was smoking.  Sx have improved significantly.  At this time there does not appear to be any evidence of an acute emergency medical condition and the patient appears stable for discharge with appropriate outpatient follow up.    Linwood Dibbles,  MD 07/14/14 [phone]

## 2014-07-14 NOTE — ED Notes (Signed)
Pt states that he was dx with MS last year.  States he feels nervous and jittery.  Smoked some weed 30 minutes ago.  NAD.

## 2014-07-14 NOTE — ED Notes (Signed)
He states he feels "better now".  He states he did not partake in any unusual substance.  He states he has had one other untoward reaction similar to this after having smoked marijuana which did not require hospital admission.

## 2014-07-14 NOTE — Discharge Instructions (Signed)
Cannabis Use Disorder Cannabis use disorder is a mental disorder. It is not one-time or occasional use of cannabis, more commonly known as marijuana. Cannabis use disorder is the continued, nonmedical use of cannabis that interferes with normal life activities or causes health problems. People with cannabis use disorder get a feeling of extreme pleasure and relaxation from cannabis use. This "high" is very rewarding and causes people to use over and over.  The mind-altering ingredient in cannabis is know as THC. THC can also interfere with motor coordination, memory, judgment, and accurate sense of space and time. These effects can last for a few days after using cannabis. Regular heavy cannabis use can cause long-lasting problems with thinking and learning. In young people, these problems may be permanent. Cannabis sometimes causes severe anxiety, paranoia, or visual hallucinations. Man-made (synthetic) cannabis-like drugs, such as "spice" and "K2," cause the same effects as THC but are much stronger. Cannabis-like drugs can cause dangerously high blood pressure and heart rate.  Cannabis use disorder usually starts in the teenage years. It can trigger the development of schizophrenia. It is somewhat more common in men than women. People who have family members with the disorder or existing mental health issues such as depression and posttraumatic stress disorderare more likely to develop cannabis use disorder. People with cannabis use disorder are at higher risk for use of other drugs of abuse.  SIGNS AND SYMPTOMS Signs and symptoms of cannabis use disorder include:   Use of cannabis in larger amounts or over a longer period than intended.   Unsuccessful attempts to cut down or control cannabis use.   A lot of time spent obtaining, using, or recovering from the effects of cannabis.   A strong desire or urge to use cannabis (cravings).   Continued use of cannabis in spite of problems at work,  school, or home because of use.   Continued use of cannabis in spite of relationship problems because of use.  Giving up or cutting down on important life activities because of cannabis use.  Use of cannabis over and over even in situations when it is physically hazardous, such as when driving a car.   Continued use of cannabis in spite of a physical problem that is likely related to use. Physical problems can include:  Chronic cough.  Bronchitis.  Emphysema.  Throat and lung cancer.  Continued use of cannabis in spite of a mental problem that is likely related to use. Mental problems can include:  Psychosis.  Anxiety.  Difficulty sleeping.  Need to use more and more cannabis to get the same effect, or lessened effect over time with use of the same amount (tolerance).  Having withdrawal symptoms when cannabis use is stopped, or using cannabis to reduce or avoid withdrawal symptoms. Withdrawal symptoms include:  Irritability or anger.  Anxiety or restlessness.  Difficulty sleeping.  Loss of appetite or weight.  Aches and pains.  Shakiness.  Sweating.  Chills. DIAGNOSIS Cannabis use disorder is diagnosed by your health care provider. You may be asked questions about your cannabis use and how it affects your life. A physical exam may be done. A drug screen may be done. You may be referred to a mental health professional. The diagnosis of cannabis use disorder requires at least two symptoms within 12 months. The type of cannabis use disorder you have depends on the number of symptoms you have. The type may be:  Mild. Two or three signs and symptoms.   Moderate. Four or   five signs and symptoms.   °· Severe. Six or more signs and symptoms.   °TREATMENT °Treatment is usually provided by mental health professionals with training in substance use disorders. The following options are available: °· Counseling or talk therapy. Talk therapy addresses the reasons you use  cannabis. It also addresses ways to keep you from using again. The goals of talk therapy include: °¨ Identifying and avoiding triggers for use. °¨ Learning how to handle cravings. °¨ Replacing use with healthy activities. °· Support groups. Support groups provide emotional support, advice, and guidance. °· Medicine. Medicine is used to treat mental health issues that trigger cannabis use or that result from it. °HOME CARE INSTRUCTIONS °· Take medicines only as directed by your health care provider. °· Check with your health care provider before starting any new medicines. °· Keep all follow-up visits as directed by your health care provider. °SEEK MEDICAL CARE IF: °· You are not able to take your medicines as directed. °· Your symptoms get worse. °SEEK IMMEDIATE MEDICAL CARE IF: °You have serious thoughts about hurting yourself or others. °FOR MORE INFORMATION °· National Institute on Drug Abuse: www.drugabuse.gov °· Substance Abuse and Mental Health Services Administration: www.samhsa.gov °Document Released: 05/01/2000 Document Revised: 09/18/2013 Document Reviewed: 05/17/2013 °ExitCare® Patient Information ©2015 [employer]. This information is not intended to replace advice given to you by your health care provider. Make sure you discuss any questions you have with your health care provider. ° °

## 2014-09-11 ENCOUNTER — Telehealth: Payer: Self-pay | Admitting: *Deleted

## 2014-09-11 NOTE — Telephone Encounter (Signed)
Patient would like to try a different med  Call back number [phone]

## 2014-09-12 NOTE — Telephone Encounter (Signed)
Patient called to let me know he can not afford labs and is asking about MS medication which one he can take . I explained to him he would have to have a cbc with dif and cmp and jc virus drawn before we can start any medications . I advised him to contact [facility] for their assistance program for help in paying for this .

## 2014-09-13 ENCOUNTER — Encounter: Payer: Self-pay | Admitting: Internal Medicine

## 2014-09-13 ENCOUNTER — Ambulatory Visit: Payer: MEDICAID | Attending: Internal Medicine | Admitting: Internal Medicine

## 2014-09-13 VITALS — BP 96/61 | HR 72 | Temp 98.1°F | Wt 153.5 lb

## 2014-09-13 DIAGNOSIS — G35 Multiple sclerosis: Secondary | ICD-10-CM

## 2014-09-13 DIAGNOSIS — R202 Paresthesia of skin: Secondary | ICD-10-CM

## 2014-09-13 MED ORDER — SODIUM CHLORIDE 0.9 % IV SOLN
250.0000 mg | Freq: Once | INTRAVENOUS | Status: AC
  Administered 2014-09-13: 250 mg via INTRAMUSCULAR

## 2014-09-13 MED ORDER — PREDNISONE 20 MG PO TABS: ORAL_TABLET | ORAL | Status: DC

## 2014-09-13 MED ORDER — METHYLPREDNISOLONE SODIUM SUCC 125 MG IJ SOLR: 125.0000 mg | Freq: Once | INTRAMUSCULAR | Status: DC

## 2014-09-13 NOTE — Patient Instructions (Signed)

## 2014-09-13 NOTE — Progress Notes (Signed)
Patient here to get medications managed.  Patient says he feet hurt when he walks.

## 2014-09-13 NOTE — Progress Notes (Signed)
Subjective:     Patient ID: Mike Lowery, male   DOB: December 29, 1985, 29 y.o.   MRN: [id number]  HPI   Mike Lowery is 29 yo male with history of multiple sclerosis diagnosed 1 y ago and recent ED visit 07/14/14 for perceptual disturbance due to marijuana intoxication presents today for 4 day history of numbness and tingling all over his body. Says both his hands always feel numb but he woke up 4 d ago with numbness and tingling all over his body and pain at the soles of both feet when he tries to walk. Also has been having episodes of presyncope in the past few days where he feels like he's about to fall over when he goes from sitting to standing and feels dizzy and lightheaded. Does not note any acute changes in vision. No headaches, SOB, dyspnea, chest pain. Denies any family history of MS or other neurological conditions. Says he's taken baclofen last few days, but it has not helped his numbness or pain. His last appointment with the neurologist was 2 mo ago, at which time he was prescribed some medications for MS but he was unable to afford them.    Active Ambulatory Problems    Diagnosis Date Noted  . Multiple sclerosis 12/24/2013  . Swelling of both hands 05/14/2014   Resolved Ambulatory Problems    Diagnosis Date Noted  . No Resolved Ambulatory Problems   No Additional Past Medical History   Outpatient Encounter Prescriptions as of 09/13/2014  Medication Sig  . baclofen (LIORESAL) 10 MG tablet Take 1 tablet (10 mg total) by mouth 3 (three) times daily as needed for muscle spasms.  . predniSONE (DELTASONE) 20 MG tablet 3 Tabs PO daily for 5 days (days 1 - 5), then 2 tabs PO days (days 6 - 10), then 1 tab PO daily for 10 days (days 11 - 20), then Half Tab PO daily from day 21  . [DISCONTINUED] prednisoLONE 5 MG TABS tablet Take 2 tablets (10 mg total) by mouth daily.  . [DISCONTINUED] predniSONE (DELTASONE) 20 MG tablet 3 Tabs PO daily for 5 days (days 1 - 5), then 2 tabs PO days (days 6 -  10), then 1 tab PO daily for 10 days (days 11 - 20), then Half Tab PO daily from day 21  . methylPREDNISolone sodium succinate (SOLU-MEDROL) 125 mg/2 mL injection Inject 2 mLs (125 mg total) into the muscle once.  . pantoprazole (PROTONIX) 40 MG tablet Take 1 tablet (40 mg total) by mouth daily. (Patient not taking: Reported on 09/13/2014)     Review of Systems  Constitutional: Positive for activity change and fatigue. Negative for fever, chills and appetite change.  Eyes: Negative for visual disturbance.  Respiratory: Negative for cough, chest tightness and shortness of breath.   Cardiovascular: Negative for chest pain and leg swelling.  Gastrointestinal: Positive for nausea. Negative for vomiting, abdominal pain, diarrhea and constipation.  Neurological: Positive for dizziness, light-headedness and numbness. Negative for syncope, facial asymmetry, speech difficulty and headaches.  Psychiatric/Behavioral: Negative for sleep disturbance.      Objective: Filed Vitals:   09/13/14 1435  BP: 96/61  Pulse: 72  Temp: 98.1 F (36.7 C)      Physical Exam  Constitutional:  Tired-appearing 29 yo AA male alert, oriented, and in no acute distress.  HENT:  Head: Normocephalic and atraumatic.  Eyes: Conjunctivae and EOM are normal. Pupils are equal, round, and reactive to light.  Neck: Normal range of motion. Neck supple.  Cardiovascular: Normal rate, regular rhythm, normal heart sounds and intact distal pulses.   Pulmonary/Chest: Effort normal and breath sounds normal. No respiratory distress.  Abdominal: Soft. Bowel sounds are normal.  Musculoskeletal: Normal range of motion. He exhibits no edema.  Lymphadenopathy:    He has no cervical adenopathy.  Neurological: He exhibits normal muscle tone. Coordination normal.     Assessment & Plan:   Mike Lowery is 29 yo male with history of multiple sclerosis diagnosed 1 y ago and recent ED visit 07/14/14 for perceptual disturbance due to marijuana  intoxication presents today for 4 day history of numbness and tingling all over his body.   Multiple Sclerosis Patient's complaints of numbness and tingling all over his body is likely because of parasthesias due to flare of MS. Will treat patient in clinic with IM methylprednisone and prescribe 5 d course of oral prednisone to treat MS flare. Patient refused blood test for JCV antibody test and CBC with differential / platelets. Patient is scheduled for follow up appointment with neurologist in 1 mo.   -     methylPREDNISolone sodium succinate (SOLU-MEDROL) 125 mg/2 mL injection; Inject 2 mLs (125 mg total) into the muscle once. -     predniSONE (DELTASONE) 20 MG tablet; 3 Tabs PO daily for 5 days (days 1 - 5), then 2 tabs PO days (days 6 - 10), then 1 tab PO daily for 10 days (days 11 - 20), then Half Tab PO daily from day 21   Evaluation and management procedures were performed by me with Medical Student in attendance, note written by medical student under my supervision and collaboration. I have reviewed the note and I agree with the management and plan.   Mike Lewandowsky, MD, MHA, CPE, FACP, FAAP [facility] [place], [place] [phone]   11/08/2014, 3:36 PM

## 2014-09-14 ENCOUNTER — Other Ambulatory Visit: Payer: Self-pay

## 2014-09-17 ENCOUNTER — Other Ambulatory Visit: Payer: Self-pay | Admitting: Internal Medicine

## 2014-09-17 ENCOUNTER — Ambulatory Visit: Payer: Self-pay | Attending: Internal Medicine

## 2014-09-17 DIAGNOSIS — G35 Multiple sclerosis: Secondary | ICD-10-CM

## 2014-09-17 DIAGNOSIS — R202 Paresthesia of skin: Secondary | ICD-10-CM

## 2014-09-17 LAB — CBC WITH DIFFERENTIAL/PLATELET
BASOS ABS: 0.1 10*3/uL (ref 0.0–0.1)
Basophils Relative: 1 % (ref 0–1)
EOS PCT: 4 % (ref 0–5)
Eosinophils Absolute: 0.2 10*3/uL (ref 0.0–0.7)
HCT: 38.7 % — ABNORMAL LOW (ref 39.0–52.0)
Hemoglobin: 13.1 g/dL (ref 13.0–17.0)
Lymphocytes Relative: 47 % — ABNORMAL HIGH (ref 12–46)
Lymphs Abs: 2.5 10*3/uL (ref 0.7–4.0)
MCH: 29.4 pg (ref 26.0–34.0)
MCHC: 33.9 g/dL (ref 30.0–36.0)
MCV: 87 fL (ref 78.0–100.0)
MONO ABS: 0.4 10*3/uL (ref 0.1–1.0)
MPV: 9.8 fL (ref 8.6–12.4)
Monocytes Relative: 7 % (ref 3–12)
Neutro Abs: 2.2 10*3/uL (ref 1.7–7.7)
Neutrophils Relative %: 41 % — ABNORMAL LOW (ref 43–77)
PLATELETS: 274 10*3/uL (ref 150–400)
RBC: 4.45 MIL/uL (ref 4.22–5.81)
RDW: 13.6 % (ref 11.5–15.5)
WBC: 5.3 10*3/uL (ref 4.0–10.5)

## 2014-09-18 LAB — CBC WITH DIFFERENTIAL/PLATELET

## 2014-09-21 LAB — STRATIFY JCV ANTIBODY ELISA W/RFLX TO INHIBITION ASSAY

## 2014-09-21 LAB — STRATIFY JCV AB INHIBITION (REFLEX)

## 2014-09-24 ENCOUNTER — Telehealth: Payer: Self-pay | Admitting: Neurology

## 2014-09-24 ENCOUNTER — Other Ambulatory Visit: Payer: Self-pay | Admitting: *Deleted

## 2014-09-24 ENCOUNTER — Telehealth: Payer: Self-pay | Admitting: Emergency Medicine

## 2014-09-24 DIAGNOSIS — G35 Multiple sclerosis: Secondary | ICD-10-CM

## 2014-09-24 NOTE — Telephone Encounter (Signed)
Patient is asking about lab results please .

## 2014-09-24 NOTE — Telephone Encounter (Signed)
-----   Message from Quentin Angst, MD sent at 09/24/2014  4:52 PM EDT ----- Please inform patient to keep appointment with neurologist based on the results of his laboratory tests.

## 2014-09-24 NOTE — Telephone Encounter (Signed)
Pt called and wants to know about the blood work results please call [phone]

## 2014-09-24 NOTE — Telephone Encounter (Signed)
Pt instructed to keep Neurology appt based on lab findings  Verbalized understanding

## 2014-09-24 NOTE — Telephone Encounter (Signed)
JC Virus is positive.  We can discuss this further at his next visit on the 16th.  CBC looks okay.  The CMP and Vitamin D level were not checked, however.  We need this done too.

## 2014-09-24 NOTE — Telephone Encounter (Signed)
Patient is aware of lab results he has also been ask to come by and pick up a lab slip for more lab work it is waiting at front desk.

## 2014-10-01 ENCOUNTER — Ambulatory Visit (INDEPENDENT_AMBULATORY_CARE_PROVIDER_SITE_OTHER): Payer: Self-pay | Admitting: Neurology

## 2014-10-01 ENCOUNTER — Encounter: Payer: Self-pay | Admitting: Neurology

## 2014-10-01 VITALS — BP 96/66 | HR 62 | Resp 18 | Wt 152.1 lb

## 2014-10-01 DIAGNOSIS — G35 Multiple sclerosis: Secondary | ICD-10-CM | POA: Insufficient documentation

## 2014-10-01 NOTE — Progress Notes (Addendum)
NEUROLOGY FOLLOW UP OFFICE NOTE  Mike Lowery [id number]  HISTORY OF PRESENT ILLNESS: Mike Lowery is a 29 year old right-handed man with no significant past medical history who follows upf for relapsing-remitting multiple sclerosis.  Records and labs reviewed.  He is accompanied by his girlfriend who provides some history.  UPDATE: Labs from 09/17/14 showed WBC of 5.3 with neutrophil 41% and lymphocytes 47%.  JCV antibody was positive, however index was not performed.  He also did not have vitamin D or CMP performed,.  He says he cannot afford the medication for MS, but we told him we can set him up for an assistance program.  Due to increased pain, numbness and tingling in his body, his PCP prescribed a prednisone taper about 3 weeks ago.  HISTORY: He presented to [facility] on 12/24/13 for left sided numbness and weakness of one week duration.  He developed some mild weakness in the hands and feet and then the numbness progressed to involve the left side of his body, reportedly up to the upper chest.  He then developed numbness in the toes of his right foot, radiating up to the mid-thigh.    He had a similar episode about 4-5 months prior, which resolved spontaneously after a few days.  He was admitted and underwent an MS workup.  MRI of the brain with and without contrast revealed multifocal hyperintense lesions involving the cerebral white matter near the lateral ventricles oriented perpendicular and bilateral temporal lobes.  There was no abnormal enhancement.  There was mild thinning of the anterior body of the corpus callosum.  MRI of the cervical spine revealed multiple hyperintense lesions, including a segment from C3-C4 to C5-C6 level with subtle enhancement.  There is a non-enhancing plaque from C6-C7 to T1-T2 and subtle hyperintense lesion at C2-3.  Blood work included NMO-IgG negative, HIV negative, normal CBC and BMP.  He was given IV Solumedrol  twice daily for 3 days and  discharged with a prednisone taper.  Symptoms improved during his hospitalization.  Due to lack of finances, he was unable to follow up with neurology and initiate a disease modifying agent.  He has since been to the ED once for a perceived flare up and was started on prednisone.  Since his diagnosis, he feels depressed.  He still has numbness in his extremities.  His legs gave out on him once but otherwise, he has no problems walking.  He denies bowel or bladder incontinence or retention.  He feels fatigued but he also has problems falling asleep at night.  He denies any vision abnormalities.  He says that his arms sometimes tense up and spasm.  He denies family history of MS  PAST MEDICAL HISTORY: Past Medical History  Diagnosis Date  . Multiple sclerosis 12/24/13  . Multiple sclerosis     MEDICATIONS: Current Outpatient Prescriptions on File Prior to Visit  Medication Sig Dispense Refill  . baclofen (LIORESAL) 10 MG tablet Take 1 tablet (10 mg total) by mouth 3 (three) times daily as needed for muscle spasms. 90 each 0  . methylPREDNISolone sodium succinate (SOLU-MEDROL) 125 mg/2 mL injection Inject 2 mLs (125 mg total) into the muscle once. (Patient not taking: Reported on 10/01/2014) 1 each 0  . pantoprazole (PROTONIX) 40 MG tablet Take 1 tablet (40 mg total) by mouth daily. (Patient not taking: Reported on 10/01/2014) 14 tablet 0  . predniSONE (DELTASONE) 20 MG tablet 3 Tabs PO daily for 5 days (days 1 - 5),  then 2 tabs PO days (days 6 - 10), then 1 tab PO daily for 10 days (days 11 - 20), then Half Tab PO daily from day 21 (Patient not taking: Reported on 10/01/2014) 60 tablet 0   No current facility-administered medications on file prior to visit.    ALLERGIES: No Known Allergies  FAMILY HISTORY: Family History  Problem Relation Age of Onset  . Cancer Maternal Grandmother     unknown     SOCIAL HISTORY: History   Social History  . Marital Status: Single    Spouse Name: N/A    . Number of Children: N/A  . Years of Education: N/A   Occupational History  . Not on file.   Social History Main Topics  . Smoking status: Current Every Day Smoker    Types: Cigarettes  . Smokeless tobacco: Never Used  . Alcohol Use: 0.0 oz/week    0 Standard drinks or equivalent per week     Comment: 8 oz liquor q other day social   . Drug Use: Yes    Special: Marijuana     Comment: former  . Sexual Activity:    Partners: Female   Other Topics Concern  . Not on file   Social History Narrative    REVIEW OF SYSTEMS: Constitutional: Fatigue Eyes: No visual changes, double vision, eye pain Ear, nose and throat: No hearing loss, ear pain, nasal congestion, sore throat Cardiovascular: No chest pain, palpitations Respiratory:  No shortness of breath at rest or with exertion, wheezes GastrointestinaI: No nausea, vomiting, diarrhea, abdominal pain, fecal incontinence Genitourinary:  No dysuria, urinary retention or frequency Musculoskeletal:  Generalized pain Integumentary: No rash, pruritus, skin lesions Neurological: as above Psychiatric: anxiety Endocrine: No palpitations, fatigue, diaphoresis, mood swings, change in appetite, change in weight, increased thirst Hematologic/Lymphatic:  No anemia, purpura, petechiae. Allergic/Immunologic: no itchy/runny eyes, nasal congestion, recent allergic reactions, rashes  PHYSICAL EXAM: Filed Vitals:   10/01/14 1343  BP: 96/66  Pulse: 62  Resp: 18   General: No acute distress Head:  Normocephalic/atraumatic  IMPRESSION: Relapsing-remitting MS  PLAN: 1.  I explained that there are assistance programs to help with getting disease modifying agents.  His first choice is Plegrity.  If this is not an option, he would like to try Betaseron 2.  Check CMP and vitamin D level.  I explained that he can have this done at the [facility] if cost is a concern. 3.  He will follow up 3 months after starting the disease modifying agent.   Repeat MRI of brain and cervical spine with and without contrast 6 months after initiating treatment.  30 minutes spent face to face with patient and girlfriend, 100% spent discussing various disease modifying agents and need to start therapy and have lab work done.  Shon Millet, DO  CC:  Jeanann Lewandowsky, MD

## 2014-10-01 NOTE — Patient Instructions (Signed)
1.  We will check the vitamin D level and liver tests.  Have these tests done at the [facility]. 2.  We will set you up for injections (either Plegrity or Betaseron) 3.  Follow up in 3 months after starting the MS medication.  We will repeat MRI of brain and cervical spine with and without contrast 6 months after starting the MS medication.

## 2014-10-10 ENCOUNTER — Telehealth: Payer: Self-pay | Admitting: *Deleted

## 2014-10-10 ENCOUNTER — Telehealth: Payer: Self-pay | Admitting: Neurology

## 2014-10-10 ENCOUNTER — Ambulatory Visit: Payer: Self-pay | Attending: Internal Medicine

## 2014-10-10 ENCOUNTER — Other Ambulatory Visit: Payer: Self-pay | Admitting: *Deleted

## 2014-10-10 DIAGNOSIS — G35D Multiple sclerosis, unspecified: Secondary | ICD-10-CM

## 2014-10-10 DIAGNOSIS — G35 Multiple sclerosis: Secondary | ICD-10-CM

## 2014-10-10 NOTE — Telephone Encounter (Signed)
Patient is aware he still needs labs drawn slip was faxed to [phone]

## 2014-10-10 NOTE — Telephone Encounter (Signed)
Lab slip was faxed to [phone]

## 2014-10-10 NOTE — Telephone Encounter (Signed)
Pt wants to know if he still needs the blood work done and he wants the orders faxed to the lab please call him at [phone]

## 2014-10-11 ENCOUNTER — Telehealth: Payer: Self-pay | Admitting: *Deleted

## 2014-10-11 LAB — VITAMIN D 25 HYDROXY (VIT D DEFICIENCY, FRACTURES): Vit D, 25-Hydroxy: 10 ng/mL — ABNORMAL LOW (ref 30–100)

## 2014-10-11 NOTE — Telephone Encounter (Signed)
I left message for patient regarding labs and to start D3 4000 units daily asap

## 2014-10-11 NOTE — Telephone Encounter (Signed)
-----   Message from Drema Dallas, DO sent at 10/11/2014  6:41 AM EDT ----- Vitamin D level is very low.  I would start D3 4000 units daily. ----- Message -----    From: Lab in [facility] Interface    Sent: 10/11/2014   2:52 AM      To: Drema Dallas, DO

## 2014-10-12 LAB — COMPLETE METABOLIC PANEL WITH GFR
ALT: 8 U/L (ref 0–53)
AST: 14 U/L (ref 0–37)
Albumin: 4.2 g/dL (ref 3.5–5.2)
Alkaline Phosphatase: 67 U/L (ref 39–117)
BUN: 10 mg/dL (ref 6–23)
CALCIUM: 8.8 mg/dL (ref 8.4–10.5)
CO2: 24 mEq/L (ref 19–32)
Chloride: 108 mEq/L (ref 96–112)
Creat: 0.91 mg/dL (ref 0.50–1.35)
GFR, Est African American: >89 mL/min
GFR, Est Non African American: >89 mL/min
GLUCOSE: 75 mg/dL (ref 70–99)
POTASSIUM: 4.1 meq/L (ref 3.5–5.3)
Sodium: 139 mEq/L (ref 135–145)
TOTAL PROTEIN: 5.9 g/dL — AB (ref 6.0–8.3)
Total Bilirubin: 0.3 mg/dL (ref 0.2–1.2)

## 2014-10-23 ENCOUNTER — Other Ambulatory Visit: Payer: Self-pay | Admitting: *Deleted

## 2014-10-23 ENCOUNTER — Telehealth: Payer: Self-pay | Admitting: Neurology

## 2014-10-23 MED ORDER — VITAMIN D (ERGOCALCIFEROL) 1.25 MG (50000 UNIT) PO CAPS: 50000.0000 [IU] | ORAL_CAPSULE | ORAL | Status: DC

## 2014-10-23 NOTE — Telephone Encounter (Signed)
Per lab results patient made aware of results and Rx

## 2014-10-23 NOTE — Telephone Encounter (Signed)
Pt called in regards to blood work/[name] CB# [phone]

## 2014-10-23 NOTE — Telephone Encounter (Signed)
Patient notified of his lab results and Rx sent to the pharmacy. Patient concerned about this medication and the start of his MS medication. He states they will be out tomorrow to get him started on this please advise

## 2014-10-24 NOTE — Telephone Encounter (Signed)
Yes or no to vit.D

## 2014-10-24 NOTE — Telephone Encounter (Signed)
We discussed at length the medication options already, including risks and benefits and I provided him with ample information about it.  My opinion is he needs to be on a disease-modifying agent.

## 2014-10-24 NOTE — Telephone Encounter (Signed)
He should be taking vitamin D as instructed

## 2014-10-24 NOTE — Telephone Encounter (Signed)
Dr. Everlena Cooper patient, I will route to him.

## 2014-10-25 NOTE — Telephone Encounter (Signed)
Patient notified

## 2014-11-08 ENCOUNTER — Ambulatory Visit: Payer: Self-pay | Attending: Internal Medicine | Admitting: Internal Medicine

## 2014-11-08 ENCOUNTER — Encounter: Payer: Self-pay | Admitting: Internal Medicine

## 2014-11-08 VITALS — BP 104/62 | HR 86 | Temp 98.3°F | Resp 18 | Ht 68.0 in | Wt 151.6 lb

## 2014-11-08 DIAGNOSIS — G35 Multiple sclerosis: Secondary | ICD-10-CM

## 2014-11-08 DIAGNOSIS — F329 Major depressive disorder, single episode, unspecified: Secondary | ICD-10-CM | POA: Insufficient documentation

## 2014-11-08 DIAGNOSIS — R110 Nausea: Secondary | ICD-10-CM

## 2014-11-08 DIAGNOSIS — F17210 Nicotine dependence, cigarettes, uncomplicated: Secondary | ICD-10-CM | POA: Insufficient documentation

## 2014-11-08 MED ORDER — ACETAMINOPHEN-CODEINE #3 300-30 MG PO TABS: 1.0000 | ORAL_TABLET | ORAL | Status: DC | PRN

## 2014-11-08 MED ORDER — ONDANSETRON HCL 4 MG PO TABS: 4.0000 mg | ORAL_TABLET | Freq: Three times a day (TID) | ORAL | Status: DC | PRN

## 2014-11-08 MED ORDER — DULOXETINE HCL 30 MG PO CPEP: 30.0000 mg | ORAL_CAPSULE | Freq: Every day | ORAL | Status: DC

## 2014-11-08 NOTE — Progress Notes (Addendum)
Patient ID: Mike Lowery, male   DOB: [birth year], 28 y.o.   MRN: [id number]   Mike Lowery, is a 29 y.o. male  [id number]  [id number]  DOB - 11/20/1985  Chief Complaint  Patient presents with  . Numbness  . Follow-up        Subjective:   Mike Lowery is a 29 y.o. male here today for a follow up visit. Patient has history of relapsing remitting multiple sclerosis currently on biweekly injection of Plegridy, had his first injection last week Tuesday. Patient states he feels "alright" today, and that he has been resting a lot. Patient reports numbness down entire left side of body and numbness in mouth. Patient has no complaints of pain at this time. Patient reports receiving Plegridy (Peginterferon-alpha) injections for MS every two weeks. About 6 hours after injection, patient states he "goes into a frenzy" with headache, sweats, and feeling cold/chills as well as intense nausea. Patient says that his prescribing doctor is aware of this, and that it is a side effect of these injections. Patient requests a prescription or something to help alleviate those side effects whenever he gets the injections, and that his next injection is scheduled for next week.  Patient cutting back on smoking cigarettes. Reports smoking 1 "black&mild" about 2 months ago.  Patient is currently unemployed, he claims he has been depressed lately and he would like to start medication for depression. He denies any suicidal ideation or thoughts. His girlfriend is present at this visit also give some history.  HISTORY: He presented to [facility] on 12/24/13 for left sided numbness and weakness of one week duration. He developed some mild weakness in the hands and feet and then the numbness progressed to involve the left side of his body, reportedly up to the upper chest. He then developed numbness in the toes of his right foot, radiating up to the mid-thigh. He had a similar episode about 4-5 months prior,  which resolved spontaneously after a few days.  He was admitted and underwent an MS workup. MRI of the brain with and without contrast revealed multifocal hyperintense lesions involving the cerebral white matter near the lateral ventricles oriented perpendicular and bilateral temporal lobes. There was no abnormal enhancement. There was mild thinning of the anterior body of the corpus callosum. MRI of the cervical spine revealed multiple hyperintense lesions, including a segment from C3-C4 to C5-C6 level with subtle enhancement. There is a non-enhancing plaque from C6-C7 to T1-T2 and subtle hyperintense lesion at C2-3. Blood work included NMO-IgG negative, HIV negative, normal CBC and BMP. He was given IV Solumedrol 500mg  twice daily for 3 days and discharged with a prednisone taper. Symptoms improved during his hospitalization.  Due to lack of finances, he was unable to follow up with neurology and initiate a disease modifying agent. He has since been to the ED once for a perceived flare up and was started on prednisone. Since his diagnosis, he feels depressed. He still has numbness in his extremities. His legs gave out on him once but otherwise, he has no problems walking. He denies bowel or bladder incontinence or retention. He feels fatigued but he also has problems falling asleep at night. He denies any vision abnormalities. He says that his arms sometimes tense up and spasm.    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 Multiple sclerosis 12/24/13  .  Multiple sclerosis     MEDICATIONS AT HOME: Prior to Admission medications   Medication Sig Start Date End Date Taking? Authorizing Provider  baclofen (LIORESAL) 10 MG tablet Take 1 tablet (10 mg total) by mouth 3 (three) times daily as needed for muscle spasms. 06/26/14  Yes Drema Dallas, DO  Vitamin D,  Ergocalciferol, (DRISDOL) 50000 UNITS CAPS capsule Take 1 capsule (50,000 Units total) by mouth every 7 (seven) days. For 8 weeks 10/23/14  Yes Donika K Patel, DO  acetaminophen-codeine (TYLENOL #3) 300-30 MG per tablet Take 1 tablet by mouth every 4 (four) hours as needed. 11/08/14   Quentin Angst, MD  DULoxetine (CYMBALTA) 30 MG capsule Take 1 capsule (30 mg total) by mouth daily. 11/08/14   Quentin Angst, MD  methylPREDNISolone sodium succinate (SOLU-MEDROL) 125 mg/2 mL injection Inject 2 mLs (125 mg total) into the muscle once. Patient not taking: Reported on 10/01/2014 09/13/14   Quentin Angst, MD  ondansetron (ZOFRAN) 4 MG tablet Take 1 tablet (4 mg total) by mouth every 8 (eight) hours as needed for nausea or vomiting. 11/08/14   Quentin Angst, MD  pantoprazole (PROTONIX) 40 MG tablet Take 1 tablet (40 mg total) by mouth daily. Patient not taking: Reported on 10/01/2014 12/27/13   Dow Adolph, MD  predniSONE (DELTASONE) 20 MG tablet 3 Tabs PO daily for 5 days (days 1 - 5), then 2 tabs PO days (days 6 - 10), then 1 tab PO daily for 10 days (days 11 - 20), then Half Tab PO daily from day 21 Patient not taking: Reported on 10/01/2014 09/13/14   Quentin Angst, MD     Objective:   Filed Vitals:   11/08/14 1530  BP: 104/62  Pulse: 86  Temp: 98.3 F (36.8 C)  TempSrc: Oral  Resp: 18  Height:  (1.727 m)  Weight: 151 lb 9.6 oz (68.765 kg)  SpO2: 99%    Exam General appearance : Awake, alert, not in any distress. Speech Clear. Not toxic looking HEENT: Atraumatic and Normocephalic, pupils equally reactive to light and accomodation Neck: supple, no JVD. No cervical lymphadenopathy.  Chest:Good air entry bilaterally, no added sounds  CVS: S1 S2 regular, no murmurs.  Abdomen: Bowel sounds present, Non tender and not distended with no gaurding, rigidity or rebound. Extremities: B/L Lower Ext shows no edema, both legs are warm to touch Neurology: Awake alert,  and oriented X 3, CN II-XII intact, Non focal Skin:No Rash  Data Review No results found for: HGBA1C   Assessment & Plan   1. Multiple sclerosis  - acetaminophen-codeine (TYLENOL #3) 300-30 MG per tablet; Take 1 tablet by mouth every 4 (four) hours as needed.  Dispense: 30 tablet; Refill: 0  2. Nausea without vomiting  - ondansetron (ZOFRAN) 4 MG tablet; Take 1 tablet (4 mg total) by mouth every 8 (eight) hours as needed for nausea or vomiting.  Dispense: 30 tablet; Refill: 0  3. 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  Dispense: 30 capsule; Refill: 3  Patient have been counseled extensively about nutrition and exercise Return in about 3 months (around 02/08/2015), or if symptoms worsen or fail to improve, for Multiple Sclerosis.  The patient was given clear instructions to go to ER or return to medical center if symptoms don't improve, worsen or new problems develop. The patient verbalized understanding. The patient was told to call to  get lab results if they haven't heard anything in the next week.   This note has been created with Education officer, environmental. Any transcriptional errors are unintentional.    Jeanann Lewandowsky, MD, MHA, CPE, FACP, FAAP [facility] [place], [place] [phone]   11/08/2014, 4:04 PM

## 2014-11-08 NOTE — Patient Instructions (Signed)

## 2014-11-08 NOTE — Progress Notes (Signed)
Patient here for follow up of MS/paresthesia.  Patient states he feels "alright" today, and that he has been resting a lot.  Patient reports numbness down entire left side of body and numbness in mouth.  Patient has no complaints of pain at this time.  Patient reports receiving Plegridy (Peginterferon-alpha) injections for MS every two weeks.  About 6 hours after each injection, patient states he "goes into a frenzy" with headache, sweats, and feeling cold. Patient says that his prescribing doctor is aware of this, and that it is a side effect of these injections.  Patient requests a prescription or something to help alleviate those side effects whenever he gets the injections, and that his next injection is scheduled for next week.  Patient cutting back on smoking cigarettes.  Reports smoking 1 "black&mild" about 2 months ago.   BP: 104/62, HR 86.

## 2014-11-14 ENCOUNTER — Telehealth: Payer: Self-pay | Admitting: Clinical

## 2014-11-14 NOTE — Telephone Encounter (Signed)
Attempt to f/u w pt 

## 2014-11-23 ENCOUNTER — Telehealth: Payer: Self-pay | Admitting: Internal Medicine

## 2014-11-23 NOTE — Telephone Encounter (Signed)
Pt is requesting a refill for acetaminophen-codeine (TYLENOL #3) 300-30 MG per tablet. Please follow up with pt. Last prescribed on 6/23 (qty of 30 tab to be taken once every 4 hours as needed).

## 2015-01-04 ENCOUNTER — Ambulatory Visit (INDEPENDENT_AMBULATORY_CARE_PROVIDER_SITE_OTHER): Payer: Medicaid Other | Admitting: Neurology

## 2015-01-04 ENCOUNTER — Encounter: Payer: Self-pay | Admitting: Neurology

## 2015-01-04 VITALS — BP 106/74 | HR 72 | Resp 18 | Ht 68.0 in | Wt 157.2 lb

## 2015-01-04 DIAGNOSIS — G35 Multiple sclerosis: Secondary | ICD-10-CM | POA: Diagnosis not present

## 2015-01-04 DIAGNOSIS — F329 Major depressive disorder, single episode, unspecified: Secondary | ICD-10-CM | POA: Diagnosis not present

## 2015-01-04 NOTE — Progress Notes (Signed)
NEUROLOGY FOLLOW UP OFFICE NOTE  CODEN FRANCHI [id number]  HISTORY OF PRESENT ILLNESS: Mike Lowery is a 29 year old right-handed man with no significant past medical history who follows upf for relapsing-remitting multiple sclerosis.  History obtained from patient and PCP note from June.  Labs from May reviewed.  UPDATE: He was started on Plegrity in May.  He reports that the following day after the injection, he will feel fatigued and have a headache.  Over the past month, he also notes a rash that resolves after a day.  He also takes the baclofen, which helps.  However, he is unable to afford any more medications, including vitamin D.  He has significant depression and is tearful about his current status. He reports that he just generally feels bad.  He notes difficulty seeing in mildly dark rooms.    Labs from May include normal LFTs and vitamin D level of 10.    HISTORY: He presented to [facility] on 12/24/13 for left sided numbness and weakness of one week duration.  He developed some mild weakness in the hands and feet and then the numbness progressed to involve the left side of his body, reportedly up to the upper chest.  He then developed numbness in the toes of his right foot, radiating up to the mid-thigh.    He had a similar episode about 4-5 months prior, which resolved spontaneously after a few days.  He was admitted and underwent an MS workup.  MRI of the brain with and without contrast revealed multifocal hyperintense lesions involving the cerebral white matter near the lateral ventricles oriented perpendicular and bilateral temporal lobes.  There was no abnormal enhancement.  There was mild thinning of the anterior body of the corpus callosum.  MRI of the cervical spine revealed multiple hyperintense lesions, including a segment from C3-C4 to C5-C6 level with subtle enhancement.  There is a non-enhancing plaque from C6-C7 to T1-T2 and subtle hyperintense lesion at C2-3.  Blood  work included NMO-IgG negative, HIV negative, normal CBC and BMP.  He was given IV Solumedrol 500mg  twice daily for 3 days and discharged with a prednisone taper.  Symptoms improved during his hospitalization.  Due to lack of finances, he was unable to follow up with neurology and initiate a disease modifying agent.  He has since been to the ED once for a perceived flare up and was started on prednisone.  Since his diagnosis, he feels depressed.  He still has numbness in his extremities.  His legs gave out on him once but otherwise, he has no problems walking.  He denies bowel or bladder incontinence or retention.  He feels fatigued but he also has problems falling asleep at night.  He denies any vision abnormalities.  He says that his arms sometimes tense up and spasm.  Labs from 09/17/14 showed WBC of 5.3 with neutrophil 41% and lymphocytes 47%.  JCV antibody was positive  He denies family history of MS  PAST MEDICAL HISTORY: Past Medical History  Diagnosis Date  . Multiple sclerosis 12/24/13  . Multiple sclerosis     MEDICATIONS: Current Outpatient Prescriptions on File Prior to Visit  Medication Sig Dispense Refill  . baclofen (LIORESAL) 10 MG tablet Take 1 tablet (10 mg total) by mouth 3 (three) times daily as needed for muscle spasms. 90 each 0  . Vitamin D, Ergocalciferol, (DRISDOL) 50000 UNITS CAPS capsule Take 1 capsule (50,000 Units total) by mouth every 7 (seven) days. For 8 weeks  10 capsule 0  . acetaminophen-codeine (TYLENOL #3) 300-30 MG per tablet Take 1 tablet by mouth every 4 (four) hours as needed. 30 tablet 0  . DULoxetine (CYMBALTA) 30 MG capsule Take 1 capsule (30 mg total) by mouth daily. 30 capsule 3  . methylPREDNISolone sodium succinate (SOLU-MEDROL) 125 mg/2 mL injection Inject 2 mLs (125 mg total) into the muscle once. (Patient not taking: Reported on 10/01/2014) 1 each 0  . ondansetron (ZOFRAN) 4 MG tablet Take 1 tablet (4 mg total) by mouth every 8 (eight) hours as  needed for nausea or vomiting. 30 tablet 0  . pantoprazole (PROTONIX) 40 MG tablet Take 1 tablet (40 mg total) by mouth daily. (Patient not taking: Reported on 10/01/2014) 14 tablet 0  . predniSONE (DELTASONE) 20 MG tablet 3 Tabs PO daily for 5 days (days 1 - 5), then 2 tabs PO days (days 6 - 10), then 1 tab PO daily for 10 days (days 11 - 20), then Half Tab PO daily from day 21 (Patient not taking: Reported on 10/01/2014) 60 tablet 0   No current facility-administered medications on file prior to visit.    ALLERGIES: No Known Allergies  FAMILY HISTORY: Family History  Problem Relation Age of Onset  . Cancer Maternal Grandmother     unknown     SOCIAL HISTORY: Social History   Social History  . Marital Status: Single    Spouse Name: N/A  . Number of Children: N/A  . Years of Education: N/A   Occupational History  . Not on file.   Social History Main Topics  . Smoking status: Current Every Day Smoker    Types: Cigarettes  . Smokeless tobacco: Former Neurosurgeon    Quit date: 09/08/2014  . Alcohol Use: No     Comment: 8 oz liquor q other day social   . Drug Use: No     Comment: former  . Sexual Activity:    Partners: Female   Other Topics Concern  . Not on file   Social History Narrative    REVIEW OF SYSTEMS: Constitutional: No fevers, chills, or sweats, no generalized fatigue, change in appetite Eyes: No visual changes, double vision, eye pain Ear, nose and throat: No hearing loss, ear pain, nasal congestion, sore throat Cardiovascular: No chest pain, palpitations Respiratory:  No shortness of breath at rest or with exertion, wheezes GastrointestinaI: No nausea, vomiting, diarrhea, abdominal pain, fecal incontinence Genitourinary:  No dysuria, urinary retention or frequency Musculoskeletal:  No neck pain, back pain Integumentary: No rash, pruritus, skin lesions Neurological: as above Psychiatric: No depression, insomnia, anxiety Endocrine: No palpitations, fatigue,  diaphoresis, mood swings, change in appetite, change in weight, increased thirst Hematologic/Lymphatic:  No anemia, purpura, petechiae. Allergic/Immunologic: no itchy/runny eyes, nasal congestion, recent allergic reactions, rashes  PHYSICAL EXAM: Filed Vitals:   01/04/15 1109  BP: 106/74  Pulse: 72  Resp: 18   General: No acute distress.  Patient appears well-groomed.  Depressed, tearful Head:  Normocephalic/atraumatic Eyes:  Fundoscopic exam unremarkable without vessel changes, exudates, hemorrhages or papilledema. Neck: supple, no paraspinal tenderness, full range of motion Heart:  Regular rate and rhythm Lungs:  Clear to auscultation bilaterally Back: No paraspinal tenderness Neurological Exam: alert and oriented to person, place, and time. Attention span and concentration intact, recent and remote memory intact, fund of knowledge intact.  Speech fluent and not dysarthric, language intact.  CN II-XII intact. Fundoscopic exam unremarkable without vessel changes, exudates, hemorrhages or papilledema.  Bulk and tone normal, muscle  strength 5-/5 in deltoids and triceps, quads, hamstrings and right ankle dorsiflexion.   4+ grip bilaterally.  Otherwise 5/5.  Sensation to pinprick reduced in lower extremities, right worse than left.  Vibration intact.  Deep tendon reflexes 3+ in patellars, otherwise 2+ throughout, toes downgoing.  Finger to nose and heel to shin testing intact.  Gait mildly antalgic, Timed 25 foot walk 6.55 seconds, Romberg negative.  IMPRESSION: Relapsing-remitting MS Depression  PLAN: The problem lies in the fact that he is unable to afford any medications despite starting Medicaid. He will continue Plegridy Continue baclofen  up to three times daily as needed Would benefit seeing a counselor and/or starting an antidepressant.  He is unable to afford Cymbalta which has been prescribed.  He is not receptive at this time. Recommended D3 4000 units daily MRI of brain and  cervical spine with and without contrast.   I want him to follow up afterward the MRI to discuss whether we need to change to an oral agent.  He was hesitant before, due to potential side effects  Shon Millet, DO  CC: Jeanann Lewandowsky, MD

## 2015-01-04 NOTE — Patient Instructions (Signed)
Continue baclofen  up to three times daily as needed for muscle spasms Continue Plegridy.  Need to contact the [employer] nurse Will check MRI of brain and cervical spine with and without contrast Recommend taking vitamin D 4000 units daily Follow up after MRI

## 2015-01-15 ENCOUNTER — Other Ambulatory Visit (HOSPITAL_COMMUNITY): Payer: Self-pay

## 2015-01-15 ENCOUNTER — Ambulatory Visit (HOSPITAL_COMMUNITY): Payer: Self-pay

## 2015-01-17 ENCOUNTER — Telehealth: Payer: Self-pay | Admitting: Internal Medicine

## 2015-01-17 NOTE — Telephone Encounter (Signed)
Patient came in requesting  A refill for acetaminophen-codeine (TYLENOL #3)  Patient never picked up the prescription when first prescribed. Please follow up.

## 2015-01-22 ENCOUNTER — Ambulatory Visit: Payer: Medicaid Other | Admitting: Neurology

## 2015-01-30 ENCOUNTER — Ambulatory Visit (HOSPITAL_COMMUNITY): Payer: Medicaid Other

## 2015-01-30 ENCOUNTER — Ambulatory Visit (HOSPITAL_COMMUNITY): Admission: RE | Admit: 2015-01-30 | Payer: Medicaid Other | Source: Ambulatory Visit

## 2015-01-31 ENCOUNTER — Ambulatory Visit: Payer: Medicaid Other | Admitting: Neurology

## 2015-02-13 ENCOUNTER — Ambulatory Visit (HOSPITAL_COMMUNITY): Admission: RE | Admit: 2015-02-13 | Payer: Medicaid Other | Source: Ambulatory Visit

## 2015-02-21 ENCOUNTER — Ambulatory Visit (HOSPITAL_COMMUNITY)
Admission: RE | Admit: 2015-02-21 | Discharge: 2015-02-21 | Disposition: A | Discharge status: Home / Self Care | Payer: Medicaid Other | Source: Ambulatory Visit | Attending: Neurology | Admitting: Neurology

## 2015-02-21 ENCOUNTER — Ambulatory Visit (HOSPITAL_COMMUNITY): Admission: RE | Admit: 2015-02-21 | Payer: Medicaid Other | Source: Ambulatory Visit

## 2015-02-21 DIAGNOSIS — G939 Disorder of brain, unspecified: Secondary | ICD-10-CM | POA: Diagnosis not present

## 2015-02-21 DIAGNOSIS — G35 Multiple sclerosis: Secondary | ICD-10-CM | POA: Diagnosis not present

## 2015-02-21 MED ORDER — GADOBENATE DIMEGLUMINE 529 MG/ML IV SOLN
13.0000 mL | Freq: Once | INTRAVENOUS | Status: AC | PRN
  Administered 2015-02-21: 13 mL via INTRAVENOUS

## 2015-02-22 ENCOUNTER — Telehealth: Payer: Self-pay | Admitting: *Deleted

## 2015-02-22 DIAGNOSIS — G35 Multiple sclerosis: Secondary | ICD-10-CM

## 2015-02-22 NOTE — Telephone Encounter (Signed)
Per Dr Everlena Cooper DO NOT refill Plegridy, Drug rep will come with forms on Monday so we can order this test.

## 2015-02-22 NOTE — Telephone Encounter (Signed)
Notes Recorded by Drema Dallas, DO on 02/22/2015 at 11:35 AM MRI shows progression of his MS. He needs to make an appointment with me to discuss changing his medication.   Details

## 2015-02-22 NOTE — Telephone Encounter (Signed)
===  View-only below this line===  ----- Message -----    From: Drema Dallas, DO    Sent: 02/22/2015   1:40 PM      To: Marlowe Kays, CMA  Totiana Everson, when we schedule a follow-up appointment for this patient, I would like labs drawn a week prior to follow up.  I would like to check CBC with diff, CMP and quantitative JC Virus antibody WITH INDEX (via [facility]).

## 2015-02-22 NOTE — Telephone Encounter (Signed)
Needs refill of Plegridy sent to pharmacy      Wants to make sooner appointment than whats available   Wednesday  03/06/2015 at 1pm

## 2015-02-26 NOTE — Telephone Encounter (Signed)
Informed patient that he needs to come in tomorrow for JC virus lab.  Patient said that he would try to but would call if unable.

## 2015-02-26 NOTE — Telephone Encounter (Signed)
Mike Lowery, This is the patient that we are waiting on the lab form from Rep in case she comes in today   His other orders are in

## 2015-02-27 ENCOUNTER — Telehealth: Payer: Self-pay | Admitting: Neurology

## 2015-02-27 NOTE — Telephone Encounter (Signed)
Pt states that he cant get the paper work done because he has a over due bill please call [phone]

## 2015-02-27 NOTE — Telephone Encounter (Signed)
Attempted to call patient. No answer and no voicemail.  °

## 2015-02-28 NOTE — Telephone Encounter (Signed)
No answer and no voicemail.  I suppose he did not get lab work done.

## 2015-03-05 ENCOUNTER — Other Ambulatory Visit: Payer: Self-pay | Admitting: *Deleted

## 2015-03-05 DIAGNOSIS — G35 Multiple sclerosis: Secondary | ICD-10-CM

## 2015-03-06 ENCOUNTER — Encounter: Payer: Self-pay | Admitting: Neurology

## 2015-03-06 ENCOUNTER — Ambulatory Visit (INDEPENDENT_AMBULATORY_CARE_PROVIDER_SITE_OTHER): Payer: Medicaid Other | Admitting: Neurology

## 2015-03-06 VITALS — BP 126/72 | HR 90 | Wt 154.0 lb

## 2015-03-06 DIAGNOSIS — F32A Depression, unspecified: Secondary | ICD-10-CM

## 2015-03-06 DIAGNOSIS — G35 Multiple sclerosis: Secondary | ICD-10-CM | POA: Diagnosis not present

## 2015-03-06 DIAGNOSIS — G35A Relapsing-remitting multiple sclerosis: Secondary | ICD-10-CM

## 2015-03-06 DIAGNOSIS — Z720 Tobacco use: Secondary | ICD-10-CM | POA: Diagnosis not present

## 2015-03-06 DIAGNOSIS — F329 Major depressive disorder, single episode, unspecified: Secondary | ICD-10-CM

## 2015-03-06 NOTE — Patient Instructions (Addendum)
1.  We won't continue the Plegridy.  Instead, I would like to start you on Gilenya. 2.  We will need to get EKG and you will need to take the first dose in a cardiology suite because the first dose can lower your heart rate for a little bit. 3.  You will need to get a formal eye exam.  4.  We will check CBC with diff, CMP, varicella zoster, and JC Virus index. 5.  Follow up 3 months after starting medication.  I will want to recheck CBC with diff and LFTs at that time as well.

## 2015-03-06 NOTE — Progress Notes (Signed)
NEUROLOGY FOLLOW UP OFFICE NOTE  Mike Lowery [id number]  HISTORY OF PRESENT ILLNESS: Mike Lowery is a 29 year old right-handed man with no significant past medical history who follows upf for relapsing-remitting multiple sclerosis.  Images of MRIs from earlier this month reviewed.   UPDATE: MRI of brain with and without contrast from 02/21/15 showed new lesions, some enhancing, in the left hemisphere and brachium pontis bilaterally.  MRI of cervical spine showed interval improvement in plaques.  Based on severity, I wanted to discuss with Mike Lowery about switching to an oral agent.  He was hesitant when we first discussed treatment options.  He continues to feel weak.  He is significantly depressed but does not want to start antidepressant or seek counseling at this time.  HISTORY: He presented to [facility] on 12/24/13 for left sided numbness and weakness of one week duration.  He developed some mild weakness in the hands and feet and then the numbness progressed to involve the left side of his body, reportedly up to the upper chest.  He then developed numbness in the toes of his right foot, radiating up to the mid-thigh.    He had a similar episode about 4-5 months prior, which resolved spontaneously after a few days.  He was admitted and underwent an MS workup.  MRI of the brain with and without contrast revealed multifocal hyperintense lesions involving the cerebral white matter near the lateral ventricles oriented perpendicular and bilateral temporal lobes.  There was no abnormal enhancement.  There was mild thinning of the anterior body of the corpus callosum.  MRI of the cervical spine revealed multiple hyperintense lesions, including a segment from C3-C4 to C5-C6 level with subtle enhancement.  There is a non-enhancing plaque from C6-C7 to T1-T2 and subtle hyperintense lesion at C2-3.  Blood work included NMO-IgG negative, HIV negative, normal CBC and BMP.  He was given IV Solumedrol  500mg  twice daily for 3 days and discharged with a prednisone taper.  Symptoms improved during his hospitalization.  Due to lack of finances, he was unable to follow up with neurology and initiate a disease modifying agent.  He has since been to the ED once for a perceived flare up and was started on prednisone.  Since his diagnosis, he feels depressed.  He still has numbness in his extremities.  His legs gave out on him once but otherwise, he has no problems walking.  He denies bowel or bladder incontinence or retention.  He feels fatigued but he also has problems falling asleep at night.  He denies any vision abnormalities.  He says that his arms sometimes tense up and spasm.  He was started on Plegridy in May.  He reports that the following day after the injection, he will feel fatigued and have a headache.  Over the past month, he also notes a rash that resolves after a day.  He also takes the baclofen, which helps.  However, he is unable to afford any more medications, including vitamin D.  He has significant depression and is tearful about his current status. He reports that he just generally feels bad.  He notes difficulty seeing in mildly dark rooms.    JCV antibody is positive  He denies family history of MS  PAST MEDICAL HISTORY: Past Medical History  Diagnosis Date  . Multiple sclerosis (HCC) 12/24/13  . Multiple sclerosis [facility])     MEDICATIONS: Current Outpatient Prescriptions on File Prior to Visit  Medication Sig Dispense Refill  .  baclofen (LIORESAL) 10 MG tablet Take 1 tablet (10 mg total) by mouth 3 (three) times daily as needed for muscle spasms. 90 each 0  . DULoxetine (CYMBALTA) 30 MG capsule Take 1 capsule (30 mg total) by mouth daily. 30 capsule 3  . ondansetron (ZOFRAN) 4 MG tablet Take 1 tablet (4 mg total) by mouth every 8 (eight) hours as needed for nausea or vomiting. 30 tablet 0  . Vitamin D, Ergocalciferol, (DRISDOL) 50000 UNITS CAPS capsule Take 1 capsule (50,000  Units total) by mouth every 7 (seven) days. For 8 weeks 10 capsule 0  . acetaminophen-codeine (TYLENOL #3) 300-30 MG per tablet Take 1 tablet by mouth every 4 (four) hours as needed. (Patient not taking: Reported on 03/06/2015) 30 tablet 0  . methylPREDNISolone sodium succinate (SOLU-MEDROL) 125 mg/2 mL injection Inject 2 mLs (125 mg total) into the muscle once. (Patient not taking: Reported on 10/01/2014) 1 each 0  . pantoprazole (PROTONIX) 40 MG tablet Take 1 tablet (40 mg total) by mouth daily. (Patient not taking: Reported on 10/01/2014) 14 tablet 0  . predniSONE (DELTASONE) 20 MG tablet 3 Tabs PO daily for 5 days (days 1 - 5), then 2 tabs PO days (days 6 - 10), then 1 tab PO daily for 10 days (days 11 - 20), then Half Tab PO daily from day 21 (Patient not taking: Reported on 10/01/2014) 60 tablet 0   No current facility-administered medications on file prior to visit.    ALLERGIES: No Known Allergies  FAMILY HISTORY: Family History  Problem Relation Age of Onset  . Cancer Maternal Grandmother     unknown     SOCIAL HISTORY: Social History   Social History  . Marital Status: Single    Spouse Name: N/A  . Number of Children: N/A  . Years of Education: N/A   Occupational History  . Not on file.   Social History Main Topics  . Smoking status: Current Every Day Smoker    Types: Cigarettes  . Smokeless tobacco: Former Neurosurgeon    Quit date: 09/08/2014  . Alcohol Use: No     Comment: 8 oz liquor q other day social   . Drug Use: No     Comment: former  . Sexual Activity:    Partners: Female   Other Topics Concern  . Not on file   Social History Narrative    REVIEW OF SYSTEMS: Constitutional: generalized fatigue, change in appetite Eyes: No visual changes, double vision, eye pain Ear, nose and throat: No hearing loss, ear pain, nasal congestion, sore throat Cardiovascular: No chest pain, palpitations Respiratory:  No shortness of breath at rest or with exertion,  wheezes GastrointestinaI: No nausea, vomiting, diarrhea, abdominal pain, fecal incontinence Genitourinary:  No dysuria, urinary retention or frequency Musculoskeletal:  No neck pain, back pain Integumentary: No rash, pruritus, skin lesions Neurological: as above Psychiatric: depression, insomnia, anxiety Endocrine: fatigue Hematologic/Lymphatic:  No anemia, purpura, petechiae. Allergic/Immunologic: no itchy/runny eyes, nasal congestion, recent allergic reactions, rashes  PHYSICAL EXAM: Filed Vitals:   03/06/15 1316  BP: 126/72  Pulse: 90   General: No acute distress.  Depressed.  Tearful.. Head:  Normocephalic/atraumatic Eyes:  Fundoscopic exam unremarkable without vessel changes, exudates, hemorrhages or papilledema. Neck: supple, no paraspinal tenderness, full range of motion Heart:  Regular rate and rhythm Lungs:  Clear to auscultation bilaterally Back: No paraspinal tenderness Neurological Exam: alert and oriented to person, place, and time. Attention span and concentration intact, recent and remote memory intact, fund of  knowledge intact.  Speech fluent and not dysarthric, language intact.  CN II-XII intact. Fundoscopic exam unremarkable without vessel changes, exudates, hemorrhages or papilledema.  Bulk and tone normal, muscle strength 5-/5 in deltoids and triceps, quads, hamstrings and right ankle dorsiflexion.   4+ grip bilaterally.  Otherwise 5/5.  Sensation to pinprick reduced in lower extremities, right worse than left.  Vibration intact.  Deep tendon reflexes 3+ in patellars, otherwise 2+ throughout, toes downgoing.  Finger to nose and heel to shin testing intact.  Gait mildly antalgic, Timed 25 foot walk 6.02 seconds, Romberg negative.  IMPRESSION: Relapsing-remitting MS Depression Tobacco abuse  At this point, I would want to switch Mike Lowery to an oral agent.  After discussing Tysabri, he does not want to pursue this, particularly since he is JC Virus positive.  We will  set him up for Gilenya.  I will order a JC Virus antibody Index, in case he would still want to pursue Tysabri.  PLAN: 1.  Will set up for Gilenya:  EKG, check VZV, ophthalmology evaluation for macular edema, set up for first dose monitoring 2.  Will check CBC with diff, LFTs, and JC Virus antibody Index 3.  Vitamin D.  Baclofen 4.  Smoking cessation 5.  Follow up 3 months after initiation of medication.  Repeat CBC with diff and LFTs one week prior to follow up.  28 minutes spent face to face with patient, over 50% spent discussing reason of switching agents, as well as discussing risks and benefits of other agents.  Shon Millet, DO  CC:  Jeanann Lewandowsky, MD

## 2015-03-06 NOTE — Progress Notes (Addendum)
Pt's ophthalmology appointment scheduled with [facility] - Dr. Adele Dan on 03/13/15 @ 1 p.m. Pt (and girlfriend) were notified. Verbalized understanding and agreement.   Pt's paperwork for Gilenya completed and faxed to company @ [phone]. Awaiting response.

## 2015-03-10 ENCOUNTER — Encounter (HOSPITAL_COMMUNITY): Payer: Self-pay | Admitting: *Deleted

## 2015-03-10 ENCOUNTER — Inpatient Hospital Stay (HOSPITAL_COMMUNITY)
Admission: EM | Admit: 2015-03-10 | Discharge: 2015-03-14 | DRG: 060 | Disposition: A | Discharge status: Home / Self Care | Payer: Medicaid Other | Attending: Family Medicine | Admitting: Family Medicine

## 2015-03-10 DIAGNOSIS — G35 Multiple sclerosis: Principal | ICD-10-CM | POA: Diagnosis present

## 2015-03-10 DIAGNOSIS — F17210 Nicotine dependence, cigarettes, uncomplicated: Secondary | ICD-10-CM | POA: Diagnosis present

## 2015-03-10 DIAGNOSIS — F329 Major depressive disorder, single episode, unspecified: Secondary | ICD-10-CM | POA: Diagnosis present

## 2015-03-10 DIAGNOSIS — F32A Depression, unspecified: Secondary | ICD-10-CM | POA: Diagnosis present

## 2015-03-10 DIAGNOSIS — Z7952 Long term (current) use of systemic steroids: Secondary | ICD-10-CM

## 2015-03-10 MED ORDER — METHYLPREDNISOLONE SODIUM SUCC 1000 MG IJ SOLR
1000.0000 mg | Freq: Once | INTRAMUSCULAR | Status: AC
  Administered 2015-03-11: 1000 mg via INTRAVENOUS
  Filled 2015-03-10: qty 8

## 2015-03-10 NOTE — ED Notes (Signed)
The pt is c/o not being able to move his entire lt leg for 3 days.  He has a history of ms.

## 2015-03-10 NOTE — Consult Note (Signed)
Neurology Consultation Reason for Consult: Left leg weakness Referring Physician: Roselyn Bering  CC: Left Leg weakness  History is obtained from:patient  HPI: Mike Lowery is a 29 y.o. male with a history of MS diagnosed just over a year ago he sees Dr. Everlena Cooper as an outpatient. He was started on interferon, but stopped due to side effects. There is consideration of starting him on Tysabri, but he is JC virus positive and awaiting titers. The current plan is to pursue fingolimod.   Over the past 2 days he has had progressive weakness of his left arm and leg without involvement of his face. He is currently having some trouble getting around the house and taking care of himself.    ROS: A 14 point ROS was performed and is negative except as noted in the HPI.   Past Medical History  Diagnosis Date  . Multiple sclerosis (HCC) 12/24/13  . Multiple sclerosis (HCC)      Family History  Problem Relation Age of Onset  . Cancer Maternal Grandmother     unknown      Social History:  reports that he has been smoking Cigarettes.  He quit smokeless tobacco use about 6 months ago. He reports that he does not drink alcohol or use illicit drugs.   Exam: Current vital signs: BP 120/63 mmHg  Pulse 72  Temp(Src) 97.9 F (36.6 C) (Oral)  Resp 14  Ht  (1.702 m)  Wt 68.04 kg (150 lb)  BMI 23.49 kg/m2  SpO2 98% Vital signs in last 24 hours: Temp:  [97.9 F (36.6 C)] 97.9 F (36.6 C) (10/23 2126) Pulse Rate:  [72] 72 (10/23 2126) Resp:  [14] 14 (10/23 2126) BP: (120)/(63) 120/63 mmHg (10/23 2126) SpO2:  [98 %] 98 % (10/23 2126) Weight:  [68.04 kg (150 lb)] 68.04 kg (150 lb) (10/23 2126)   Physical Exam  Constitutional: Appears well-developed and well-nourished.  Psych: Affect appropriate to situation Eyes: No scleral injection HENT: No OP obstrucion Head: Normocephalic.  Cardiovascular: Normal rate and regular rhythm.  Respiratory: Effort normal and breath sounds normal to  anterior ascultation GI: Soft.  No distension. There is no tenderness.  Skin: WDI  Neuro: Mental Status: Patient is awake, alert, oriented to person, place, month, year, and situation. Patient is able to give a clear and coherent history. No signs of aphasia or neglect Cranial Nerves: II: Visual Fields are full. Pupils are equal, round, and reactive to light.   III,IV, VI: EOMI without ptosis or diploplia.  V: Facial sensation is symmetric to temperature VII: Facial movement is symmetric.  VIII: hearing is intact to voice X: Uvula elevates symmetrically XI: Shoulder shrug is symmetric. XII: tongue is midline without atrophy or fasciculations.  Motor: Tone is normal. Bulk is normal. 5/5 strength was present on the right, on the left he has 4/5 weakness of his left arm and 4 minus/5 weakness throughout his left leg.  Sensory: Sensation is Diminished on the left arm and leg Cerebellar: FNF slow bilaterally. GAit:  Unsteady   I have reviewed labs in epic and the results pertinent to this consultation are: Bmp - unremarkable  Impression: 29 yo M with MS flare. I would favor IV steroids with inpatient PT given the rapid progression of his disability and the impairment he currently suffers in his gait.   Recommendations: 1) Solumedrol 1gm daily x 3-5 days. Would give first dose now, then splite remaining doses into  BID.  2) GI prophylaxis while on  steroids.  3) Will continue to follow.     Ritta Slot, MD [facility] [phone]  If 7pm- 7am, please page neurology on call as listed in AMION.

## 2015-03-10 NOTE — ED Provider Notes (Signed)
CSN: [id number]     Arrival date & time 03/10/15  2122 History   First MD Initiated Contact with Patient 03/10/15 2218     Chief Complaint  Patient presents with  . Leg Pain   HPI Patient has a known history of multiple sclerosis. He is currently not on any therapy. Patient saw his neurologist and just last week. There was discussion about starting him on Gilenya.  Pt is not a good candidate for Tysabri.  Patient states over the last couple of days he's noticed increasing leg weakness. He also has numbness of the leg. Symptoms are in the left leg. She has trouble walking and lifting his foot. He denies any trouble with any headaches fevers. No chest pain abdominal pain or shortness of breath.  Past Medical History  Diagnosis Date  . Multiple sclerosis (HCC) 12/24/13  . Multiple sclerosis (HCC)    History reviewed. No pertinent past surgical history. Family History  Problem Relation Age of Onset  . Cancer Maternal Grandmother     unknown    Social History  Substance Use Topics  . Smoking status: Current Every Day Smoker    Types: Cigarettes  . Smokeless tobacco: Former Neurosurgeon    Quit date: 09/08/2014  . Alcohol Use: No     Comment: 8 oz liquor q other day social     Review of Systems    Allergies  Review of patient's allergies indicates no known allergies.  Home Medications   Prior to Admission medications   Medication Sig Start Date End Date Taking? Authorizing Provider  baclofen (LIORESAL) 10 MG tablet Take 1 tablet (10 mg total) by mouth 3 (three) times daily as needed for muscle spasms. 06/26/14  Yes Drema Dallas, DO  DULoxetine (CYMBALTA) 30 MG capsule Take 1 capsule (30 mg total) by mouth daily. 11/08/14  Yes Quentin Angst, MD  acetaminophen-codeine (TYLENOL #3) 300-30 MG per tablet Take 1 tablet by mouth every 4 (four) hours as needed. Patient not taking: Reported on 03/06/2015 11/08/14   Quentin Angst, MD  methylPREDNISolone sodium succinate (SOLU-MEDROL) 125  mg/2 mL injection Inject 2 mLs (125 mg total) into the muscle once. Patient not taking: Reported on 10/01/2014 09/13/14   Quentin Angst, MD  ondansetron (ZOFRAN) 4 MG tablet Take 1 tablet (4 mg total) by mouth every 8 (eight) hours as needed for nausea or vomiting. 11/08/14   Quentin Angst, MD  pantoprazole (PROTONIX) 40 MG tablet Take 1 tablet (40 mg total) by mouth daily. Patient not taking: Reported on 10/01/2014 12/27/13   Dow Adolph, MD  predniSONE (DELTASONE) 20 MG tablet 3 Tabs PO daily for 5 days (days 1 - 5), then 2 tabs PO days (days 6 - 10), then 1 tab PO daily for 10 days (days 11 - 20), then Half Tab PO daily from day 21 Patient not taking: Reported on 10/01/2014 09/13/14   Quentin Angst, MD  Vitamin D, Ergocalciferol, (DRISDOL) 50000 UNITS CAPS capsule Take 1 capsule (50,000 Units total) by mouth every 7 (seven) days. For 8 weeks Patient not taking: Reported on 03/10/2015 10/23/14   Donika K Patel, DO   BP 120/63 mmHg  Pulse 72  Temp(Src) 97.9 F (36.6 C) (Oral)  Resp 14  Ht  (1.702 m)  Wt 150 lb (68.04 kg)  BMI 23.49 kg/m2  SpO2 98% Physical Exam Physical Exam  Constitutional: She appears well-developed and well-nourished. No distress.  HENT:  Head: Normocephalic and atraumatic.  Right  Ear: External ear normal.  Left Ear: External ear normal.  Eyes: Conjunctivae are normal. Right eye exhibits no discharge. Left eye exhibits no discharge. No scleral icterus.  Neck: Neck supple. No tracheal deviation present.  Cardiovascular: Normal rate, regular rhythm and intact distal pulses.   Pulmonary/Chest: Effort normal and breath sounds normal. No stridor. No respiratory distress. She has no wheezes. She has no rales.  Abdominal: Soft. Bowel sounds are normal. She exhibits no distension. There is no tenderness. There is no rebound and no guarding.  Musculoskeletal: She exhibits no edema or tenderness.  Neurological: She is alert. No cranial nerve deficit (no  facial droop, extraocular movements intact, no slurred speech) or sensory deficit. She exhibits normal muscle tone. She displays no seizure activity. Coordination normal.  Patient is hyperreflexive, weakness to dorsiflexion of the left foot, weakness when attempting to lift his left leg off the bed, right lower extremity is normal in the upper extremities are normal  Skin: Skin is warm and dry. No rash noted.  Psychiatric: She has a normal mood and affect.  Nursing note and vitals reviewed.  ED Course  Procedures (including critical care time) Labs Review Labs Reviewed  CBC WITH DIFFERENTIAL/PLATELET  BASIC METABOLIC PANEL      MDM   Final diagnoses:  Relapsing remitting multiple sclerosis (HCC)    Patient's symptoms are concerning for a flare of his multiple sclerosis. I consult with Dr. Amada Jupiter, neurology. The patient's symptoms have rapidly progressed over the last couple of days. He recommends admission for IV steroids.  I will consult with the hospitalist service for admission and further treatment.   Linwood Dibbles, MD 03/10/15 2330

## 2015-03-11 ENCOUNTER — Telehealth: Payer: Self-pay | Admitting: Neurology

## 2015-03-11 ENCOUNTER — Encounter (HOSPITAL_COMMUNITY): Payer: Self-pay | Admitting: Internal Medicine

## 2015-03-11 ENCOUNTER — Other Ambulatory Visit: Payer: Self-pay

## 2015-03-11 DIAGNOSIS — F329 Major depressive disorder, single episode, unspecified: Secondary | ICD-10-CM

## 2015-03-11 DIAGNOSIS — G35 Multiple sclerosis: Secondary | ICD-10-CM

## 2015-03-11 DIAGNOSIS — F17210 Nicotine dependence, cigarettes, uncomplicated: Secondary | ICD-10-CM | POA: Diagnosis present

## 2015-03-11 DIAGNOSIS — G35D Multiple sclerosis, unspecified: Secondary | ICD-10-CM | POA: Diagnosis present

## 2015-03-11 DIAGNOSIS — Z7952 Long term (current) use of systemic steroids: Secondary | ICD-10-CM | POA: Diagnosis not present

## 2015-03-11 LAB — BASIC METABOLIC PANEL
Anion gap: 11 (ref 5–15)
Anion gap: 8 (ref 5–15)
BUN: 6 mg/dL (ref 6–20)
BUN: 8 mg/dL (ref 6–20)
CALCIUM: 9.6 mg/dL (ref 8.9–10.3)
CALCIUM: 8.7 mg/dL — AB (ref 8.9–10.3)
CO2: 23 mmol/L (ref 22–32)
CO2: 26 mmol/L (ref 22–32)
Chloride: 103 mmol/L (ref 101–111)
Chloride: 105 mmol/L (ref 101–111)
Creatinine, Ser: 0.88 mg/dL (ref 0.61–1.24)
Creatinine, Ser: 1 mg/dL (ref 0.61–1.24)
GFR calc Af Amer: >60 mL/min (ref >60)
GFR calc Af Amer: >60 mL/min (ref >60)
GLUCOSE: 159 mg/dL — AB (ref 65–99)
GLUCOSE: 101 mg/dL — AB (ref 65–99)
Potassium: 3.5 mmol/L (ref 3.5–5.1)
Potassium: 3.8 mmol/L (ref 3.5–5.1)
Sodium: 137 mmol/L (ref 135–145)
Sodium: 139 mmol/L (ref 135–145)

## 2015-03-11 LAB — CBC
HCT: 39.2 % (ref 39.0–52.0)
HEMATOCRIT: 39.1 % (ref 39.0–52.0)
Hemoglobin: 13.1 g/dL (ref 13.0–17.0)
Hemoglobin: 13.2 g/dL (ref 13.0–17.0)
MCH: 28.5 pg (ref 26.0–34.0)
MCH: 28.7 pg (ref 26.0–34.0)
MCHC: 33.5 g/dL (ref 30.0–36.0)
MCHC: 33.7 g/dL (ref 30.0–36.0)
MCV: 85.2 fL (ref 78.0–100.0)
MCV: 85.2 fL (ref 78.0–100.0)
PLATELETS: 252 10*3/uL (ref 150–400)
Platelets: 252 10*3/uL (ref 150–400)
RBC: 4.6 MIL/uL (ref 4.22–5.81)
RBC: 4.59 MIL/uL (ref 4.22–5.81)
RDW: 12.9 % (ref 11.5–15.5)
RDW: 13 % (ref 11.5–15.5)
WBC: 5.2 10*3/uL (ref 4.0–10.5)
WBC: 5.5 10*3/uL (ref 4.0–10.5)

## 2015-03-11 LAB — CBC WITH DIFFERENTIAL/PLATELET
BASOS ABS: 0 10*3/uL (ref 0.0–0.1)
BASOS PCT: 0 %
Eosinophils Absolute: 0.2 10*3/uL (ref 0.0–0.7)
Eosinophils Relative: 4 %
HEMATOCRIT: 38.2 % — AB (ref 39.0–52.0)
HEMOGLOBIN: 13 g/dL (ref 13.0–17.0)
Lymphocytes Relative: 51 %
Lymphs Abs: 2.4 10*3/uL (ref 0.7–4.0)
MCH: 29 pg (ref 26.0–34.0)
MCHC: 34 g/dL (ref 30.0–36.0)
MCV: 85.3 fL (ref 78.0–100.0)
Monocytes Absolute: 0.3 10*3/uL (ref 0.1–1.0)
Monocytes Relative: 6 %
NEUTROS ABS: 1.9 10*3/uL (ref 1.7–7.7)
NEUTROS PCT: 39 %
Platelets: 245 10*3/uL (ref 150–400)
RBC: 4.48 MIL/uL (ref 4.22–5.81)
RDW: 13 % (ref 11.5–15.5)
WBC: 4.8 10*3/uL (ref 4.0–10.5)

## 2015-03-11 LAB — CREATININE, SERUM
CREATININE: 0.94 mg/dL (ref 0.61–1.24)
GFR calc Af Amer: >60 mL/min (ref >60)
GFR calc non Af Amer: >60 mL/min (ref >60)

## 2015-03-11 MED ORDER — SODIUM CHLORIDE 0.9 % IV SOLN
500.0000 mg | Freq: Two times a day (BID) | INTRAVENOUS | Status: DC
Start: 2015-03-11 — End: 2015-03-11

## 2015-03-11 MED ORDER — ENOXAPARIN SODIUM 40 MG/0.4ML ~~LOC~~ SOLN
40.0000 mg | SUBCUTANEOUS | Status: DC
  Administered 2015-03-11 – 2015-03-14 (×4): 40 mg via SUBCUTANEOUS
  Filled 2015-03-11 (×5): qty 0.4

## 2015-03-11 MED ORDER — PANTOPRAZOLE SODIUM 40 MG PO TBEC
40.0000 mg | DELAYED_RELEASE_TABLET | Freq: Every day | ORAL | Status: DC
Start: 2015-03-11 — End: 2015-03-14
  Administered 2015-03-12 – 2015-03-14 (×3): 40 mg via ORAL
  Filled 2015-03-11 (×3): qty 1

## 2015-03-11 MED ORDER — ONDANSETRON HCL 4 MG PO TABS
4.0000 mg | ORAL_TABLET | Freq: Four times a day (QID) | ORAL | Status: DC | PRN
Start: 2015-03-11 — End: 2015-03-14

## 2015-03-11 MED ORDER — ONDANSETRON HCL 4 MG PO TABS: 4.0000 mg | ORAL_TABLET | Freq: Three times a day (TID) | ORAL | Status: DC | PRN

## 2015-03-11 MED ORDER — BACLOFEN 10 MG PO TABS: 10.0000 mg | ORAL_TABLET | Freq: Three times a day (TID) | ORAL | Status: DC | PRN

## 2015-03-11 MED ORDER — ONDANSETRON HCL 4 MG/2ML IJ SOLN: 4.0000 mg | Freq: Four times a day (QID) | INTRAMUSCULAR | Status: DC | PRN

## 2015-03-11 MED ORDER — DULOXETINE HCL 30 MG PO CPEP
30.0000 mg | ORAL_CAPSULE | Freq: Every day | ORAL | Status: DC
  Administered 2015-03-11 – 2015-03-14 (×4): 30 mg via ORAL
  Filled 2015-03-11 (×4): qty 1

## 2015-03-11 MED ORDER — ACETAMINOPHEN 325 MG PO TABS: 650.0000 mg | ORAL_TABLET | Freq: Four times a day (QID) | ORAL | Status: DC | PRN

## 2015-03-11 MED ORDER — SODIUM CHLORIDE 0.9 % IV SOLN
INTRAVENOUS | Status: AC
  Administered 2015-03-11: 11:00:00 via INTRAVENOUS

## 2015-03-11 MED ORDER — FINGOLIMOD HCL 0.5 MG PO CAPS: 0.5000 mg | ORAL_CAPSULE | Freq: Every day | ORAL | Status: DC

## 2015-03-11 MED ORDER — SODIUM CHLORIDE 0.9 % IV SOLN
1000.0000 mg | Freq: Every day | INTRAVENOUS | Status: AC
  Administered 2015-03-11 – 2015-03-14 (×4): 1000 mg via INTRAVENOUS
  Filled 2015-03-11 (×5): qty 8

## 2015-03-11 MED ORDER — ACETAMINOPHEN 650 MG RE SUPP
650.0000 mg | Freq: Four times a day (QID) | RECTAL | Status: DC | PRN
Start: 2015-03-11 — End: 2015-03-14

## 2015-03-11 NOTE — Telephone Encounter (Signed)
Left message on Tim's voicemail.

## 2015-03-11 NOTE — Progress Notes (Signed)
Rehab Admissions Coordinator Note:  Patient was screened by Trish Mage for appropriateness for an Inpatient Acute Rehab Consult.  At this time, we are recommending Inpatient Rehab consult.  Trish Mage 03/11/2015, 3:57 PM  I can be reached at [phone].

## 2015-03-11 NOTE — Progress Notes (Signed)
Subjective: Patient has received first dose of Solumedrol.  No side effects noted.  Continued left sided weakness.  Objective: Current vital signs: BP 119/56 mmHg  Pulse 61  Temp(Src) 98.1 F (36.7 C) (Oral)  Resp 17  Ht  (1.702 m)  Wt 68.04 kg (150 lb)  BMI 23.49 kg/m2  SpO2 97% Vital signs in last 24 hours: Temp:  [97.9 F (36.6 C)-98.1 F (36.7 C)] 98.1 F (36.7 C) (10/24 0216) Pulse Rate:  [61-72] 61 (10/24 0908) Resp:  [12-17] 17 (10/24 0908) BP: (117-120)/(56-63) 119/56 mmHg (10/24 0908) SpO2:  [97 %-99 %] 97 % (10/24 0908) Weight:  [68.04 kg (150 lb)] 68.04 kg (150 lb) (10/23 2126)  Intake/Output from previous day:   Intake/Output this shift:   Nutritional status: Diet regular Room service appropriate?: Yes; Fluid consistency:: Thin  Neurologic Exam: Mental Status: Alert, oriented, thought content appropriate.  Speech fluent without evidence of aphasia.  Able to follow 3 step commands without difficulty. Cranial Nerves: II: Discs flat bilaterally; Visual fields grossly normal, pupils equal, round, reactive to light and accommodation III,IV, VI: ptosis not present, extra-ocular motions intact bilaterally V,VII: left facial droop VIII: hearing normal bilaterally IX,X: gag reflex present XI: bilateral shoulder shrug XII: midline tongue extension Motor: Right : Upper extremity   5/5    Left:     Upper extremity   4/5  Lower extremity   5/5     Lower extremity   4-/5 Tone and bulk:normal tone throughout; no atrophy noted Sensory: Pinprick and light touch decreased on the left Cerebellar: Decreased RAM on the left hand  Lab Results: Basic Metabolic Panel:  Recent Labs Lab 03/10/15 2347  NA 137  K 3.5  CL 103  CO2 26  GLUCOSE 101*  BUN 8  CREATININE 1.00  CALCIUM 8.7*    Liver Function Tests: No results for input(s): AST, ALT, ALKPHOS, BILITOT, PROT, ALBUMIN in the last 168 hours. No results for input(s): LIPASE, AMYLASE in the last 168  hours. No results for input(s): AMMONIA in the last 168 hours.  CBC:  Recent Labs Lab 03/10/15 2347  WBC 4.8  NEUTROABS 1.9  HGB 13.0  HCT 38.2*  MCV 85.3  PLT 245    Cardiac Enzymes: No results for input(s): CKTOTAL, CKMB, CKMBINDEX, TROPONINI in the last 168 hours.  Lipid Panel: No results for input(s): CHOL, TRIG, HDL, CHOLHDL, VLDL, LDLCALC in the last 168 hours.  CBG: No results for input(s): GLUCAP in the last 168 hours.  Microbiology: No results found for this or any previous visit.  Coagulation Studies: No results for input(s): LABPROT, INR in the last 72 hours.  Imaging: No results found.  Medications:  I have reviewed the patient's current medications. Scheduled: . DULoxetine  30 mg Oral Daily  . enoxaparin (LOVENOX) injection  40 mg Subcutaneous Q24H  . [START ON 03/12/2015] methylPREDNISolone (SOLU-MEDROL) injection  1,000 mg Intravenous Daily  . pantoprazole  40 mg Oral Daily    Assessment/Plan: Patient s/p first dose of Solumedrol.  Will continue for 3-5 doses at a dose of 1g per day.    Recommendations: 1.  PT 2.  Will continue to follow with you   LOS: 0 days   Thana Farr, MD [facility] [phone] 03/11/2015  9:34 AM

## 2015-03-11 NOTE — Progress Notes (Signed)
Pt arrived to 5M16 via wheelchair.  Pt c/o of 7/10 pain in L leg and R hand.  A/O x4.  Girlfriend at bedside.  Will continue to monitor. Sondra Come, RN

## 2015-03-11 NOTE — Evaluation (Signed)
Physical Therapy Evaluation Patient Details Name: Mike Lowery MRN: [id number] DOB: 06/21/1985 Today's Date: 03/11/2015   History of Present Illness  29 y.o. male with history of depression due to multiple sclerosis, admitted for Multiple sclerosis exacerbation.  Clinical Impression  Pt admitted with the above diagnosis. Pt currently with functional limitations due to the deficits listed below (see PT Problem List). Previously independent and reports never having trouble with functional tasks prior to this most recent exacerbation of symptoms. Currently demonstrates notable gait deviations with instability, requiring min assist for balance at times when not holding an assistive device. Very motivated to regain his independence. May greatly benefit from CIR to improve his functional independence and safety with mobility prior to discharge.     Follow Up Recommendations CIR    Equipment Recommendations   (TBD)    Recommendations for Other Services Rehab consult;OT consult     Precautions / Restrictions Precautions Precautions: Fall Restrictions Weight Bearing Restrictions: No      Mobility  Bed Mobility Overal bed mobility: Modified Independent             General bed mobility comments: extra time  Transfers Overall transfer level: Needs assistance Equipment used: None Transfers: Sit to/from Stand Sit to Stand: Min assist         General transfer comment: Min assist for balance upon standing. Wide base of support. VC for awareness of instability and to stand prior to stepping forward. Performed from bed x2 and recliner.  Ambulation/Gait Ambulation/Gait assistance: Min assist Ambulation Distance (Feet): 80 Feet Assistive device: None (Intermittent use of IV pole) Gait Pattern/deviations: Step-to pattern;Step-through pattern;Decreased step length - left;Decreased stance time - left;Decreased dorsiflexion - left;Decreased weight shift to left;Wide base of support  (circumduction of LLE) Gait velocity: decreased Gait velocity interpretation: Below normal speed for age/gender General Gait Details: Min assist intermittently for balance. Demonstrates significant difficulty with Lt ankle dorsiflexion and demonstrates notable circumduction patteron on Lt. Intermittent use of IV pole for support. Cues for symmetry of gait and to focus on hip and knee flexion during swing limb advancement.  Stairs            Wheelchair Mobility    Modified Rankin (Stroke Patients Only)       Balance Overall balance assessment: Needs assistance Sitting-balance support: No upper extremity supported;Feet supported Sitting balance-Leahy Scale: Good     Standing balance support: No upper extremity supported Standing balance-Leahy Scale: Fair                               Pertinent Vitals/Pain Pain Assessment: No/denies pain    Home Living Family/patient expects to be discharged to:: Private residence Living Arrangements: Spouse/significant other;Children (girlfriend - 3 y.o. daughter) Available Help at Discharge: Family (girlfriend works mornings) Type of Home: Mobile home Home Access: Stairs to enter Entrance Stairs-Rails: Doctor, general practice of Steps: 7 Home Layout: One level Home Equipment: None      Prior Function Level of Independence: Independent         Comments: States he had no difficulty ambulating prior to this admission     Hand Dominance   Dominant Hand: Right    Extremity/Trunk Assessment   Upper Extremity Assessment: Defer to OT evaluation           Lower Extremity Assessment: LLE deficits/detail   LLE Deficits / Details: Decreased sensation throughout LLE, more so distally with light touch. MMT: knee extension  4/5, ankle dorsiflexion 3-/5, great toe extension 3-/5, knee flexion <3/5 (sitting position), hip flexion 3+/5.      Communication   Communication: No difficulties  Cognition  Arousal/Alertness: Awake/alert Behavior During Therapy: WFL for tasks assessed/performed Overall Cognitive Status: Within Functional Limits for tasks assessed                      General Comments      Exercises        Assessment/Plan    PT Assessment Patient needs continued PT services  PT Diagnosis Difficulty walking;Abnormality of gait;Hemiplegia non-dominant side   PT Problem List Decreased strength;Decreased range of motion;Decreased activity tolerance;Decreased mobility;Decreased balance;Decreased coordination;Decreased knowledge of use of DME;Impaired sensation  PT Treatment Interventions DME instruction;Gait training;Stair training;Functional mobility training;Therapeutic activities;Therapeutic exercise;Balance training;Neuromuscular re-education;Patient/family education   PT Goals (Current goals can be found in the Care Plan section) Acute Rehab PT Goals Patient Stated Goal: Get better PT Goal Formulation: With patient Time For Goal Achievement: 03/25/15 Potential to Achieve Goals: Good    Frequency Min 4X/week   Barriers to discharge Decreased caregiver support Pts girlfriend works morning shifts    Co-evaluation               End of Session Equipment Utilized During Treatment: Gait belt Activity Tolerance: Patient tolerated treatment well Patient left: in bed;with call bell/phone within reach Nurse Communication: Mobility status;Other (comment) (Pt cannot find shoes)         Time: 1610-9604 PT Time Calculation (min) (ACUTE ONLY): 20 min   Charges:   PT Evaluation $Initial PT Evaluation Tier I: 1 Procedure     PT G CodesBerton Mount 03/11/2015, 2:06 PM [facility], [place] [phone]

## 2015-03-11 NOTE — ED Notes (Signed)
Hospital bed ordered.

## 2015-03-11 NOTE — Telephone Encounter (Signed)
Mike Lowery from [employer] needs a new request form sent to him. His tell phone number is [phone] opt [phone] or you can fax it to [phone]

## 2015-03-11 NOTE — Care Management Note (Signed)
Case Management Note  Patient Details  Name: Mike Lowery MRN: [id number] Date of Birth: 05/06/1986  Subjective/Objective:                    Action/Plan: Patient was admitted with MS exacerbation. Will follow for discharge needs.  Expected Discharge Date:                  Expected Discharge Plan:     In-House Referral:     Discharge planning Services     Post Acute Care Choice:    Choice offered to:     DME Arranged:    DME Agency:     HH Arranged:    HH Agency:     Status of Service:  In process, will continue to follow  Medicare Important Message Given:    Date Medicare IM Given:    Medicare IM give by:    Date Additional Medicare IM Given:    Additional Medicare Important Message give by:     If discussed at Long Length of Stay Meetings, dates discussed:    Additional Comments:  Anda Kraft, RN 03/11/2015, 11:18 AM

## 2015-03-11 NOTE — Consult Note (Signed)
Physical Medicine and Rehabilitation Consult  Reason for Consult: LLE weakness due to MS flare Referring Physician: Dr Gonzella Lex.   HPI: Mike Lowery is a 29 y.o. male with history of depression, relapsing-remitting MS diagnosed 12/2013 off interferon due to SE and awaiting set up for [facility]. He was admitted on 03/10/15 with three day history of progressive LLE weakness and difficulty walking. He was evaluated by Dr. Petra Kuba who recommended IV solumedrol for 3-5 days followed by taper.  He has associated numbness/tingling in his hands and feet. He denies pain.    Review of Systems  HENT: Negative for hearing loss.   Eyes: Positive for blurred vision (needs glasses--was set for eye exam). Negative for double vision.  Respiratory: Negative for cough and shortness of breath.   Cardiovascular: Negative for chest pain and palpitations.  Gastrointestinal: Negative for heartburn and constipation.  Genitourinary: Negative for urgency and frequency.  Musculoskeletal: Positive for myalgias.  Skin: Negative for rash.  Neurological: Positive for sensory change (worse in RUE and LLE), focal weakness and weakness. Negative for dizziness and headaches.  Psychiatric/Behavioral: Positive for depression. Negative for memory loss.  All other systems reviewed and are negative.    Past Medical History  Diagnosis Date  . Multiple sclerosis (HCC) 12/24/13  . Multiple sclerosis [facility])     Past Surgical History  Procedure Laterality Date  . No past surgeries      Family History  Problem Relation Age of Onset  . Cancer Maternal Grandmother     unknown   . Multiple sclerosis Neg Hx     Social History:  Lives with girlfriend and 48 year old daughter. He reports that he quit smoking Cigarettes few months ago.  He quit smokeless tobacco use about 6 months ago. He reports that he does not drink alcohol or use illicit drugs. Pt does not have 24/7 support on discharge.    Allergies: No Known  Allergies    Medications Prior to Admission  Medication Sig Dispense Refill  . baclofen (LIORESAL) 10 MG tablet Take 1 tablet (10 mg total) by mouth 3 (three) times daily as needed for muscle spasms. 90 each 0  . DULoxetine (CYMBALTA) 30 MG capsule Take 1 capsule (30 mg total) by mouth daily. 30 capsule 3  . acetaminophen-codeine (TYLENOL #3) 300-30 MG per tablet Take 1 tablet by mouth every 4 (four) hours as needed. (Patient not taking: Reported on 03/06/2015) 30 tablet 0  . methylPREDNISolone sodium succinate (SOLU-MEDROL) 125 mg/2 mL injection Inject 2 mLs (125 mg total) into the muscle once. (Patient not taking: Reported on 10/01/2014) 1 each 0  . ondansetron (ZOFRAN) 4 MG tablet Take 1 tablet (4 mg total) by mouth every 8 (eight) hours as needed for nausea or vomiting. 30 tablet 0  . pantoprazole (PROTONIX) 40 MG tablet Take 1 tablet (40 mg total) by mouth daily. (Patient not taking: Reported on 10/01/2014) 14 tablet 0  . predniSONE (DELTASONE) 20 MG tablet 3 Tabs PO daily for 5 days (days 1 - 5), then 2 tabs PO days (days 6 - 10), then 1 tab PO daily for 10 days (days 11 - 20), then Half Tab PO daily from day 21 (Patient not taking: Reported on 10/01/2014) 60 tablet 0  . Vitamin D, Ergocalciferol, (DRISDOL) 50000 UNITS CAPS capsule Take 1 capsule (50,000 Units total) by mouth every 7 (seven) days. For 8 weeks (Patient not taking: Reported on 03/10/2015) 10 capsule 0    Home: Home Living  Family/patient expects to be discharged to:: Private residence Living Arrangements: Spouse/significant other Available Help at Discharge: Family (girlfriend works mornings) Type of Home: Mobile home Home Access: Stairs to enter Secretary/administrator of Steps: 7 Entrance Stairs-Rails: Right, Left Home Layout: One level Home Equipment: None  Functional History: Prior Function Level of Independence: Independent Comments: States he had no difficulty ambulating prior to this admission Functional Status:    Mobility: Bed Mobility Overal bed mobility: Modified Independent General bed mobility comments: extra time Transfers Overall transfer level: Needs assistance Equipment used: None Transfers: Sit to/from Stand Sit to Stand: Min assist General transfer comment: Min assist for balance upon standing. Wide base of support. VC for awareness of instability and to stand prior to stepping forward. Performed from bed x2 and recliner. Ambulation/Gait Ambulation/Gait assistance: Min assist Ambulation Distance (Feet): 80 Feet Assistive device: None (Intermittent use of IV pole) Gait Pattern/deviations: Step-to pattern, Step-through pattern, Decreased step length - left, Decreased stance time - left, Decreased dorsiflexion - left, Decreased weight shift to left, Wide base of support (circumduction of LLE) General Gait Details: Min assist intermittently for balance. Demonstrates significant difficulty with Lt ankle dorsiflexion and demonstrates notable circumduction patteron on Lt. Intermittent use of IV pole for support. Cues for symmetry of gait and to focus on hip and knee flexion during swing limb advancement. Gait velocity: decreased Gait velocity interpretation: Below normal speed for age/gender    ADL:    Cognition: Cognition Overall Cognitive Status: Within Functional Limits for tasks assessed Orientation Level: Oriented X4, Oriented to person, Oriented to place, Oriented to time, Oriented to situation, Appropriate for developmental age Cognition Arousal/Alertness: Awake/alert Behavior During Therapy: WFL for tasks assessed/performed Overall Cognitive Status: Within Functional Limits for tasks assessed   Blood pressure 109/67, pulse 73, temperature 98.2 F (36.8 C), temperature source Oral, resp. rate 18, height 5\' 7"  (1.702 m), weight 68.04 kg (150 lb), SpO2 99 %. Physical Exam  Vitals reviewed. Constitutional: He is oriented to person, place, and time. He appears well-developed and  well-nourished. He is sleeping. He is easily aroused.  HENT:  Head: Normocephalic and atraumatic.  Eyes: Conjunctivae and EOM are normal. Pupils are equal, round, and reactive to light. Right eye exhibits no discharge. Left eye exhibits no discharge.  Neck: Normal range of motion. Neck supple.  Cardiovascular: Normal rate and regular rhythm.   Respiratory: Effort normal and breath sounds normal. No respiratory distress. He has no wheezes.  GI: Soft. Bowel sounds are normal. He exhibits no distension. There is no tenderness.  Musculoskeletal: He exhibits no edema or tenderness.  Strength b/l UE 4/5, RLE 4/5, LLE 4-/5  Neurological: He is alert, oriented to person, place, and time and easily aroused. He displays abnormal reflex (Diminished LLE). Coordination abnormal.  Speech clear. Follows commands without difficulty.   LLE with extensor tone.  Ataxia RUE, LLE> RLE  Skin: Skin is warm and dry.  Psychiatric: He has a normal mood and affect. His behavior is normal.    No results found for this or any previous visit (from the past 24 hour(s)). No results found.  Assessment/Plan: Diagnosis: LLE weakness due to MS flare Labs and images independently reviewed.  Records reviewed and summated above.  1. Does the need for close, 24 hr/day medical supervision in concert with the patient's rehab needs make it unreasonable for this patient to be served in a less intensive setting? Potentially 2. Co-Morbidities requiring supervision/potential complications: Depression (ensure mood does not limit therapies) 3. Due to safety,  disease management, medication administration and patient education, does the patient require 24 hr/day rehab nursing? Potentially 4. Does the patient require coordinated care of a physician, rehab nurse, PT (1.5-2 hrs/day, 5 days/week) and OT (1.5-2 hrs/day, 5 days/week) to address physical and functional deficits in the context of the above medical diagnosis(es)?  Potentially Addressing deficits in the following areas: balance, endurance, locomotion, strength, transferring, grooming, toileting and psychosocial support 5. Can the patient actively participate in an intensive therapy program of at least 3 hrs of therapy per day at least 5 days per week? Yes 6. The potential for patient to make measurable gains while on inpatient rehab is good 7. Anticipated functional outcomes upon discharge from inpatient rehab are modified independent  with PT, modified independent with OT, n/a with SLP. 8. Estimated rehab length of stay to reach the above functional goals is: 6-9 days. 9. Does the patient have adequate social supports and living environment to accommodate these discharge functional goals? Yes 10. Anticipated D/C setting: Home 11. Anticipated post D/C treatments: HH therapy and Home excercise program 12. Overall Rehab/Functional Prognosis: good  RECOMMENDATIONS: This patient's condition is appropriate for continued rehabilitative care in the following setting: CIR, HH Therapy and Home Excercise Program Patient has agreed to participate in recommended program. Yes Note that insurance prior authorization may be required for reimbursement for recommended care.  Comment: Rehab Admissions Coordinator to follow up.  Maryla Morrow, MD 03/12/2015

## 2015-03-11 NOTE — Progress Notes (Signed)
[facility] PROGRESS NOTE  JOVIN FESTER ZOX:[id number] DOB: 01/17/1986 DOA: 03/10/2015 PCP: Jeanann Lewandowsky, MD  Please refer to admission H&P early this morning for detail. 29 year old male with multiple sclerosis diagnosed in 2015 (sees Dr. Everlena Cooper), initiated on interferon but stopped due to side effects presented to the ED with increasing weakness of his left extremities for the past 2 days. Patient admitted to hospitalist service and neurology consulted for MS flare.    Assessment/Plan: Acute flareup of multiple sclerosis Started on high-dose IV Solu-Medrol. (1 g per day for 3-5 doses). Neurology following. Still has weakness in his left extremities. Seen by physical therapy and recommended CIR. Consult placed. Continue GI prophylaxis.  History of depression Continue home medications. Denies symptoms at this time.  DVT prophylaxis: Subcutaneous Lovenox  Diet: Regular   Code Status:  full code Family Communication:  none at bedside Disposition Plan:  CIR consulted   Consultants:   Neurology  Procedures:   None  Antibiotics:   None  HPI/Subjective: Still has left-sided weakness and some numbness   Objective: Filed Vitals:   03/11/15 1401  BP: 120/56  Pulse: 72  Temp: 98 F (36.7 C)  Resp: 18   No intake or output data in the 24 hours ending 03/11/15 1454 Filed Weights   03/10/15 2126  Weight: 68.04 kg (150 lb)    Exam:   General: Young male not in distress  HEENT: Moist mucosa  Chest: Clear to auscultation bilaterally  CVS: Normal S1 and S2  GI: Soft, nondistended, nontender  Musculoskeletal: Warm, no edema  CNS: Alert and oriented,4+5 power in left extremities.   Data Reviewed: Basic Metabolic Panel:  Recent Labs Lab 03/10/15 2347 03/11/15 1213  NA 137 139  K 3.5 3.8  CL 103 105  CO2 26 23  GLUCOSE 101* 159*  BUN 8 6  CREATININE 1.00 0.88  0.94  CALCIUM 8.7* 9.6   Liver Function Tests: No results for  input(s): AST, ALT, ALKPHOS, BILITOT, PROT, ALBUMIN in the last 168 hours. No results for input(s): LIPASE, AMYLASE in the last 168 hours. No results for input(s): AMMONIA in the last 168 hours. CBC:  Recent Labs Lab 03/10/15 2347 03/11/15 1213  WBC 4.8 5.5  5.2  NEUTROABS 1.9  --   HGB 13.0 13.1  13.2  HCT 38.2* 39.1  39.2  MCV 85.3 85.2  85.2  PLT 245 252  252   Cardiac Enzymes: No results for input(s): CKTOTAL, CKMB, CKMBINDEX, TROPONINI in the last 168 hours. BNP (last 3 results) No results for input(s): BNP in the last 8760 hours.  ProBNP (last 3 results) No results for input(s): PROBNP in the last 8760 hours.  CBG: No results for input(s): GLUCAP in the last 168 hours.  No results found for this or any previous visit (from the past 240 hour(s)).   Studies: No results found.  Scheduled Meds: . DULoxetine  30 mg Oral Daily  . enoxaparin (LOVENOX) injection  40 mg Subcutaneous Q24H  . [START ON 03/12/2015] methylPREDNISolone (SOLU-MEDROL) injection  1,000 mg Intravenous Daily  . pantoprazole  40 mg Oral Daily   Continuous Infusions: . sodium chloride 50 mL/hr at 03/11/15 1039      Time spent: 15 minutes    Nechama Escutia  [facility] Pager [phone] If 7PM-7AM, please contact night-coverage at [email or web address], password [facility] 03/11/2015, 2:54 PM  LOS: 0 days

## 2015-03-11 NOTE — H&P (Signed)
[facility] History and Physical  KADAR CHANCE ZOX:[id number] DOB: 17-Jan-1986 DOA: 03/10/2015  Referring physician: Patsy Lager. PCP: Jeanann Lewandowsky, MD  Specialists: Dr.Jaffe. Neurologist.  Chief Complaint: Left-sided weakness.  HPI: Mike Lowery is a 29 y.o. male with known history of multiple sclerosis who was diagnosed last year and was initially on interferon but stopped due to side effects presents to the ER because of increasing weakness over the last 2 days on the left side. Patient in the ER was found to have weakness on the left side with strength around 4 x 5 and finding it difficult to walk. Neurologist on call Dr. Amada Jupiter was consulted and since patient's symptoms progressed fast patient has been admitted for IV Solu-Medrol. Patient otherwise denies any incontinence of urine or bowel or any visual symptoms headache.   Review of Systems: As presented in the history of presenting illness, rest negative.  Past Medical History  Diagnosis Date  . Multiple sclerosis (HCC) 12/24/13  . Multiple sclerosis [facility])    Past Surgical History  Procedure Laterality Date  . No past surgeries     Social History:  reports that he has been smoking Cigarettes.  He quit smokeless tobacco use about 6 months ago. He reports that he does not drink alcohol or use illicit drugs. Where does patient live home. Can patient participate in ADLs? Yes.  No Known Allergies  Family History:  Family History  Problem Relation Age of Onset  . Cancer Maternal Grandmother     unknown   . Multiple sclerosis Neg Hx       Prior to Admission medications   Medication Sig Start Date End Date Taking? Authorizing Provider  baclofen (LIORESAL) 10 MG tablet Take 1 tablet (10 mg total) by mouth 3 (three) times daily as needed for muscle spasms. 06/26/14  Yes Drema Dallas, DO  DULoxetine (CYMBALTA) 30 MG capsule Take 1 capsule (30 mg total) by mouth daily. 11/08/14  Yes Quentin Angst, MD   acetaminophen-codeine (TYLENOL #3) 300-30 MG per tablet Take 1 tablet by mouth every 4 (four) hours as needed. Patient not taking: Reported on 03/06/2015 11/08/14   Quentin Angst, MD  methylPREDNISolone sodium succinate (SOLU-MEDROL) 125 mg/2 mL injection Inject 2 mLs (125 mg total) into the muscle once. Patient not taking: Reported on 10/01/2014 09/13/14   Quentin Angst, MD  ondansetron (ZOFRAN) 4 MG tablet Take 1 tablet (4 mg total) by mouth every 8 (eight) hours as needed for nausea or vomiting. 11/08/14   Quentin Angst, MD  pantoprazole (PROTONIX) 40 MG tablet Take 1 tablet (40 mg total) by mouth daily. Patient not taking: Reported on 10/01/2014 12/27/13   Dow Adolph, MD  predniSONE (DELTASONE) 20 MG tablet 3 Tabs PO daily for 5 days (days 1 - 5), then 2 tabs PO days (days 6 - 10), then 1 tab PO daily for 10 days (days 11 - 20), then Half Tab PO daily from day 21 Patient not taking: Reported on 10/01/2014 09/13/14   Quentin Angst, MD  Vitamin D, Ergocalciferol, (DRISDOL) 50000 UNITS CAPS capsule Take 1 capsule (50,000 Units total) by mouth every 7 (seven) days. For 8 weeks Patient not taking: Reported on 03/10/2015 10/23/14   Glendale Chard, DO    Physical Exam: Filed Vitals:   03/10/15 2126  BP: 120/63  Pulse: 72  Temp: 97.9 F (36.6 C)  TempSrc: Oral  Resp: 14  Height:  (1.702 m)  Weight: 68.04 kg (150 lb)  SpO2: 98%     General:  Moderately built and nourished.  Eyes: Anicteric no pallor.  ENT: No discharge from the ears eyes nose or mouth.  Neck: No mass felt.  Cardiovascular: S1 and S2 heard.  Respiratory: No rhonchi or crepitations.  Abdomen: Soft nontender bowel sounds present.  Skin: No rash.  Musculoskeletal: No edema.  Psychiatric: Appears normal.  Neurologic: Alert awake oriented to time place and person. Left upper and lower extremity is around 4 x 5 in strength. No facial asymmetry. Tongue is midline.  Labs on Admission:   Basic Metabolic Panel:  Recent Labs Lab 03/10/15 2347  NA 137  K 3.5  CL 103  CO2 26  GLUCOSE 101*  BUN 8  CREATININE 1.00  CALCIUM 8.7*   Liver Function Tests: No results for input(s): AST, ALT, ALKPHOS, BILITOT, PROT, ALBUMIN in the last 168 hours. No results for input(s): LIPASE, AMYLASE in the last 168 hours. No results for input(s): AMMONIA in the last 168 hours. CBC:  Recent Labs Lab 03/10/15 2347  WBC 4.8  NEUTROABS 1.9  HGB 13.0  HCT 38.2*  MCV 85.3  PLT 245   Cardiac Enzymes: No results for input(s): CKTOTAL, CKMB, CKMBINDEX, TROPONINI in the last 168 hours.  BNP (last 3 results) No results for input(s): BNP in the last 8760 hours.  ProBNP (last 3 results) No results for input(s): PROBNP in the last 8760 hours.  CBG: No results for input(s): GLUCAP in the last 168 hours.  Radiological Exams on Admission: No results found.   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 Multiple sclerosis exacerbation - I have discussed with Dr. Amada Jupiter on call neurologist. Patient has received Solu-Medrol 1 g and will be placed on 5 mg IV every 12 hourly for 3 days. Closely observe. Get physical therapy consult. 2. History of depression - continue home medications. Presently has no suicidal ideation.  I have reviewed patient's old charts and labs. I have discussed with on-call neurologist.   DVT Prophylaxis Lovenox.  Code Status: Full code.  Family Communication: Discussed with patient.  Disposition Plan: Admit to inpatient.    Joss Mcdill N. [facility] Pager [phone].  If 7PM-7AM, please contact night-coverage [email or web address] Password [email or web address] 03/11/2015, 1:54 AM

## 2015-03-11 NOTE — ED Notes (Signed)
Attempted to call report to floor 

## 2015-03-12 DIAGNOSIS — F329 Major depressive disorder, single episode, unspecified: Secondary | ICD-10-CM

## 2015-03-12 DIAGNOSIS — G35 Multiple sclerosis: Secondary | ICD-10-CM

## 2015-03-12 NOTE — Progress Notes (Signed)
Subjective: patient feels slightly stronger but not back to his baseline.  He feels he is unable to DF his left foot.    Objective: Current vital signs: BP 109/67 mmHg  Pulse 73  Temp(Src) 98.2 F (36.8 C) (Oral)  Resp 18  Ht  (1.702 m)  Wt 68.04 kg (150 lb)  BMI 23.49 kg/m2  SpO2 99% Vital signs in last 24 hours: Temp:  [98 F (36.7 C)-98.7 F (37.1 C)] 98.2 F (36.8 C) (10/25 0953) Pulse Rate:  [65-79] 73 (10/25 0953) Resp:  [18] 18 (10/25 0953) BP: (109-134)/(56-67) 109/67 mmHg (10/25 0953) SpO2:  [96 %-100 %] 99 % (10/25 0953)  Intake/Output from previous day: 10/24 0701 - 10/25 0700 In: 360 [P.O.:360] Out: 3 [Urine:3] Intake/Output this shift: Total I/O In: 360 [P.O.:360] Out: -  Nutritional status: Diet regular Room service appropriate?: Yes; Fluid consistency:: Thin  Neurologic Exam: General: NAD Mental Status: Alert, oriented, thought content appropriate.  Speech fluent without evidence of aphasia.  Able to follow 3 step commands without difficulty. Cranial Nerves: II: Discs flat bilaterally; Visual fields grossly normal, pupils equal, round, reactive to light and accommodation III,IV, VI: ptosis not present, extra-ocular motions intact bilaterally V,VII: smile symmetric, facial light touch sensation normal bilaterally VIII: hearing normal bilaterally IX,X: uvula rises symmetrically XI: bilateral shoulder shrug XII: midline tongue extension without atrophy or fasciculations  Motor: Right : Upper extremity   5/5    Left:     Upper extremity   5/5  Lower extremity   5/5     Lower extremity   (note) --Knee flexion and extension 4/5, Hip flexion and extension 4/5, DF 3/5 and PF 4/5 Tone and bulk:normal tone throughout; no atrophy noted Sensory: Pinprick and light touch intact throughout, bilaterally Deep Tendon Reflexes:  Right: Upper Extremity   Left: Upper extremity   biceps (C-5 to C-6) 2/4   biceps (C-5 to C-6) 2/4 tricep (C7) 2/4    triceps (C7)  2/4 Brachioradialis (C6) 2/4  Brachioradialis (C6) 2/4  Lower Extremity Lower Extremity  quadriceps (L-2 to L-4) 3/4   quadriceps (L-2 to L-4) 3/4 Achilles (S1) 2/4   Achilles (S1) 2/4  Plantars: Right: downgoing   Left: downgoing  Gait: slow and does not DF his left foot.      Lab Results: Basic Metabolic Panel:  Recent Labs Lab 03/10/15 2347 03/11/15 1213  NA 137 139  K 3.5 3.8  CL 103 105  CO2 26 23  GLUCOSE 101* 159*  BUN 8 6  CREATININE 1.00 0.88  0.94  CALCIUM 8.7* 9.6    Liver Function Tests: No results for input(s): AST, ALT, ALKPHOS, BILITOT, PROT, ALBUMIN in the last 168 hours. No results for input(s): LIPASE, AMYLASE in the last 168 hours. No results for input(s): AMMONIA in the last 168 hours.  CBC:  Recent Labs Lab 03/10/15 2347 03/11/15 1213  WBC 4.8 5.5  5.2  NEUTROABS 1.9  --   HGB 13.0 13.1  13.2  HCT 38.2* 39.1  39.2  MCV 85.3 85.2  85.2  PLT 245 252  252    Cardiac Enzymes: No results for input(s): CKTOTAL, CKMB, CKMBINDEX, TROPONINI in the last 168 hours.  Lipid Panel: No results for input(s): CHOL, TRIG, HDL, CHOLHDL, VLDL, LDLCALC in the last 168 hours.  CBG: No results for input(s): GLUCAP in the last 168 hours.  Microbiology: No results found for this or any previous visit.  Coagulation Studies: No results for input(s): LABPROT, INR in the  last 72 hours.  Imaging: No results found.  Medications:  Scheduled: . DULoxetine  30 mg Oral Daily  . enoxaparin (LOVENOX) injection  40 mg Subcutaneous Q24H  . methylPREDNISolone (SOLU-MEDROL) injection  1,000 mg Intravenous Daily  . pantoprazole  40 mg Oral Daily    Assessment/Plan:  29 YO male S/P 2 doses of Solumedrol.  He has improved slightly but states his baseline strength is 5/5 bilaterally in LE. PT recommending CIR and he is awaiting a bed. Patient would benefit from 3 more doses of solumedrol 1 gram daily with continued Protonix for GI protection.        Siris Graus PA-C [facility] [phone]  03/12/2015, 12:39 PM

## 2015-03-12 NOTE — Progress Notes (Signed)
[facility] PROGRESS NOTE  Mike Lowery PHX:[id number] DOB: 14-Jun-1985 DOA: 03/10/2015 PCP: Jeanann Lewandowsky, MD  29 year old male with multiple sclerosis diagnosed in 2015 (sees Dr. Everlena Cooper), initiated on interferon but stopped due to side effects presented to the ED with increasing weakness of his left extremities for the past 2 days. Patient admitted to hospitalist service and neurology consulted for MS flare.    Assessment/Plan: Acute flareup of multiple sclerosis Started on high-dose IV Solu-Medrol. (1 g per day for 3-5 doses).  Continue Baclofen 10 tid Neurology following.  Seen by physical therapy and recommended CIR. No beds available Continue GI prophylaxis.  History of depression Continue cymbalata 30 daily. Denies symptoms at this time.  DVT prophylaxis: Subcutaneous Lovenox  Diet: Regular   Code Status:  full code Family Communication:  none at bedside Disposition Plan:  CIR consulted   Consultants:   Neurology  Procedures:   None  Antibiotics:   None  HPI/Subjective:  No real improvement in L sided weakness and numbness Ambulatory No other concern  Objective: Filed Vitals:   03/12/15 0953  BP: 109/67  Pulse: 73  Temp: 98.2 F (36.8 C)  Resp: 18    Intake/Output Summary (Last 24 hours) at 03/12/15 1353 Last data filed at 03/12/15 0856  Gross per 24 hour  Intake    720 ml  Output      3 ml  Net    717 ml   Filed Weights   03/10/15 2126  Weight: 68.04 kg (150 lb)    Exam:   General: Young male not in distress  HEENT: Moist mucosa  Chest: Clear to auscultation bilaterally  CVS: Normal S1 and S2  GI: Soft, nondistended, nontender  Musculoskeletal: Warm, no edema  CNS: Alert and oriented,some foot drop L side, sensory diminished in l1-5 bilaterally in non-sepcific manne.  Vision fine   Data Reviewed: Basic Metabolic Panel:  Recent Labs Lab 03/10/15 2347 03/11/15 1213  NA 137 139  K 3.5 3.8  CL 103 105   CO2 26 23  GLUCOSE 101* 159*  BUN 8 6  CREATININE 1.00 0.88  0.94  CALCIUM 8.7* 9.6   Liver Function Tests: No results for input(s): AST, ALT, ALKPHOS, BILITOT, PROT, ALBUMIN in the last 168 hours. No results for input(s): LIPASE, AMYLASE in the last 168 hours. No results for input(s): AMMONIA in the last 168 hours. CBC:  Recent Labs Lab 03/10/15 2347 03/11/15 1213  WBC 4.8 5.5  5.2  NEUTROABS 1.9  --   HGB 13.0 13.1  13.2  HCT 38.2* 39.1  39.2  MCV 85.3 85.2  85.2  PLT 245 252  252   Cardiac Enzymes: No results for input(s): CKTOTAL, CKMB, CKMBINDEX, TROPONINI in the last 168 hours. BNP (last 3 results) No results for input(s): BNP in the last 8760 hours.  ProBNP (last 3 results) No results for input(s): PROBNP in the last 8760 hours.  CBG: No results for input(s): GLUCAP in the last 168 hours.  No results found for this or any previous visit (from the past 240 hour(s)).   Studies: No results found.  Scheduled Meds: . DULoxetine  30 mg Oral Daily  . enoxaparin (LOVENOX) injection  40 mg Subcutaneous Q24H  . methylPREDNISolone (SOLU-MEDROL) injection  1,000 mg Intravenous Daily  . pantoprazole  40 mg Oral Daily   Continuous Infusions:      Time spent: 15 minutes   Pleas Koch, MD [facility] [phone]

## 2015-03-12 NOTE — Telephone Encounter (Signed)
Received fax from [employer]. Fax stated that form was, "... complete and no addition paperwork is required at this time. We will conduct a benefits investigation and confirm the results with you and your patient. "  Patient Key: [id number]

## 2015-03-12 NOTE — Clinical Social Work Note (Signed)
Patient is from home with girlfriend and 27 year old daughter.  PT is recommending CIR at time of discharge.    Disposition: CIR vs. Home  CSW signing off, but remains available for assistance should the need arise.  Vickii Penna, LCSW [phone]  [facility] Licensed Clinical Social Worker

## 2015-03-12 NOTE — Progress Notes (Signed)
Rehab admissions - Evaluated for possible admission.  I met with patient and his family.  Mom would like inpatient rehab admission.  Currently rehab beds are full with limited bed availability for next few days.  I will follow up again tomorrow for progress and bed availability.  Call me for questions.  #[phone]

## 2015-03-12 NOTE — Progress Notes (Addendum)
Physical Therapy Treatment Patient Details Name: Mike Lowery MRN: [id number] DOB: May 15, 1986 Today's Date: 03/12/2015    History of Present Illness 29 y.o. male with history of depression due to multiple sclerosis, admitted for 1.  Multiple sclerosis exacerbation.    PT Comments    Improved strength in LLE today with manual muscle testing. Some carry-over with ambulation (less circumduction) although still with significant gait impairments. Scored 9/24 with Dynamic Gait Index - In patient's with MS: Scores <12 discriminate between fallers and non-fallers. Required physical assist for stepping over objects and to safely navigate steps unless holding a rail. Patient remains appropriate for CIR to improve functional independence and safety with mobility prior to returning home. .  Follow Up Recommendations  CIR     Equipment Recommendations  Other (comment) (TBD)    Recommendations for Other Services Rehab consult;OT consult     Precautions / Restrictions Precautions Precautions: Fall Restrictions Weight Bearing Restrictions: No    Mobility  Bed Mobility Overal bed mobility: Modified Independent             General bed mobility comments: extra time  Transfers Overall transfer level: Needs assistance Equipment used: None Transfers: Sit to/from Stand Sit to Stand: Supervision         General transfer comment: Reaches for furniture or bed rail at times. Performed x4 from bed. Did not require physical assist today but guarded with weight bearing through LLE.  Ambulation/Gait Ambulation/Gait assistance: Min assist Ambulation Distance (Feet): 120 Feet (+40) Assistive device: None (Occasional use of rail in hallway) Gait Pattern/deviations: Step-to pattern;Decreased step length - right;Decreased step length - left;Decreased stance time - left;Decreased weight shift to left;Decreased dorsiflexion - left;Narrow base of support (Circumduction of LLE) Gait velocity:  decreased Gait velocity interpretation: Below normal speed for age/gender General Gait Details: Majority of bout at min guard level but required min assist when stepping over object in hallway and occasionally reaches for rail to support himself. (see DGI below). Staggers forward at times due to tripping over Lt foot. He does demonstrate better dorsiflexion and clearance, with less circumduction today however still present with obvious impairments. Cues for awareness and symmetry of gait today, focusion on Lt foot clearance, hip, and knee flexion with swing limb advancement. No buckling of LLE noted.   Stairs Stairs: Yes Stairs assistance: Min assist Stair Management: No rails;One rail Right;Step to pattern;Forwards Number of Stairs: 4 General stair comments: Min assist for balance unless holding rail for support. VC for sequencing on steps.  Wheelchair Mobility    Modified Rankin (Stroke Patients Only)       Balance                                    Cognition Arousal/Alertness: Awake/alert Behavior During Therapy: WFL for tasks assessed/performed Overall Cognitive Status: Within Functional Limits for tasks assessed                      Exercises      General Comments General comments (skin integrity, edema, etc.): MMT RLE 5/5 throughout- MMT LLE: Knee extension 4/5, knee flexion 4/5, hip flexion 4/5, ankle plantar flexion 4/5, ankle dorsiflexion 4-/5, hallux extension 4-/5.      Pertinent Vitals/Pain Pain Assessment: No/denies pain    Home Living  Prior Function            PT Goals (current goals can now be found in the care plan section) Acute Rehab PT Goals Patient Stated Goal: Get better PT Goal Formulation: With patient Time For Goal Achievement: 03/25/15 Potential to Achieve Goals: Good Progress towards PT goals: Progressing toward goals    Frequency  Min 4X/week    PT Plan Current plan remains  appropriate    Co-evaluation             End of Session Equipment Utilized During Treatment: Gait belt Activity Tolerance: Patient tolerated treatment well Patient left: in bed;with call bell/phone within reach;with family/visitor present     Time: 9562-1308 PT Time Calculation (min) (ACUTE ONLY): 24 min  Charges:  $Gait Training: 8-22 mins $Therapeutic Activity: 8-22 mins                    G Codes:      Berton Mount Apr 09, 2015, 4:16 PM [place],  [phone]

## 2015-03-13 NOTE — Progress Notes (Signed)
[facility] PROGRESS NOTE  Mike Lowery:[id number] DOB: January 08, 1986 DOA: 03/10/2015 PCP: Jeanann Lewandowsky, MD  29 year old male with multiple sclerosis, JC vir + diagnosed in 2015  (sees Dr. Everlena Cooper), initiated on interferon but stopped due to side effects presented to the ED with increasing weakness of his left extremities for the past 2 days. Patient admitted to hospitalist service and neurology consulted for MS flare.    Assessment/Plan: Acute flareup of multiple sclerosis Started on high-dose IV Solu-Medrol.  Continue Baclofen 10 tid Neurology following Seen by physical therapy and recommended CIR. No beds available Continue GI prophylaxis. No s/s hyperglycemia  History of depression Continue cymbalata 30 daily. Denies symptoms at this time.  DVT prophylaxis: Subcutaneous Lovenox  Diet: Regular   Code Status:  full code Family Communication:  none at bedside Disposition Plan:  CIR consulted   Consultants:   Neurology  Procedures:   None  Antibiotics:   None  HPI/Subjective:  Some mild improvement per patient PT recs CIR still no cp no n/v/   Objective: Filed Vitals:   03/13/15 1015  BP: 124/64  Pulse: 64  Temp: 97.8 F (36.6 C)  Resp: 16   No intake or output data in the 24 hours ending 03/13/15 1407 Filed Weights   03/10/15 2126  Weight: 68.04 kg (150 lb)    Exam:   General: Young male not in distress  HEENT: Moist mucosa  Chest: Clear to auscultation bilaterally  CVS: Normal S1 and S2  GI: Soft, nondistended, nontender  Musculoskeletal: Warm, no edema  CNS: Alert and oriented,foot drop on L side imporved sensory improved bilat.  Vision fine   Data Reviewed: Basic Metabolic Panel:  Recent Labs Lab 03/10/15 2347 03/11/15 1213  NA 137 139  K 3.5 3.8  CL 103 105  CO2 26 23  GLUCOSE 101* 159*  BUN 8 6  CREATININE 1.00 0.88  0.94  CALCIUM 8.7* 9.6   Liver Function Tests: No results for input(s): AST, ALT,  ALKPHOS, BILITOT, PROT, ALBUMIN in the last 168 hours. No results for input(s): LIPASE, AMYLASE in the last 168 hours. No results for input(s): AMMONIA in the last 168 hours. CBC:  Recent Labs Lab 03/10/15 2347 03/11/15 1213  WBC 4.8 5.5  5.2  NEUTROABS 1.9  --   HGB 13.0 13.1  13.2  HCT 38.2* 39.1  39.2  MCV 85.3 85.2  85.2  PLT 245 252  252   Cardiac Enzymes: No results for input(s): CKTOTAL, CKMB, CKMBINDEX, TROPONINI in the last 168 hours. BNP (last 3 results) No results for input(s): BNP in the last 8760 hours.  ProBNP (last 3 results) No results for input(s): PROBNP in the last 8760 hours.  CBG: No results for input(s): GLUCAP in the last 168 hours.  No results found for this or any previous visit (from the past 240 hour(s)).   Studies: No results found.  Scheduled Meds: . DULoxetine  30 mg Oral Daily  . enoxaparin (LOVENOX) injection  40 mg Subcutaneous Q24H  . methylPREDNISolone (SOLU-MEDROL) injection  1,000 mg Intravenous Daily  . pantoprazole  40 mg Oral Daily   Continuous Infusions:      Time spent: 15 minutes   Pleas Koch, MD [facility] [phone]

## 2015-03-13 NOTE — Progress Notes (Signed)
Subjective: Patient has improved significantly in left leg strength.   Objective: Current vital signs: BP 124/64 mmHg  Pulse 64  Temp(Src) 97.8 F (36.6 C) (Oral)  Resp 16  Ht  (1.702 m)  Wt 68.04 kg (150 lb)  BMI 23.49 kg/m2  SpO2 99% Vital signs in last 24 hours: Temp:  [97.8 F (36.6 C)-99.3 F (37.4 C)] 97.8 F (36.6 C) (10/26 1015) Pulse Rate:  [64-76] 64 (10/26 1015) Resp:  [16-20] 16 (10/26 1015) BP: (115-134)/(56-69) 124/64 mmHg (10/26 1015) SpO2:  [98 %-100 %] 99 % (10/26 1015)  Intake/Output from previous day: 10/25 0701 - 10/26 0700 In: 360 [P.O.:360] Out: -  Intake/Output this shift:   Nutritional status: Diet regular Room service appropriate?: Yes; Fluid consistency:: Thin  Neurologic Exam: General: NAD Mental Status: Alert, oriented, thought content appropriate.  Speech fluent without evidence of aphasia.  Able to follow 3 step commands without difficulty. Cranial Nerves: II: Discs flat bilaterally; Visual fields grossly normal, pupils equal, round, reactive to light and accommodation III,IV, VI: ptosis not present, extra-ocular motions intact bilaterally V,VII: smile symmetric, facial light touch sensation normal bilaterally VIII: hearing normal bilaterally IX,X: uvula rises symmetrically XI: bilateral shoulder shrug XII: midline tongue extension without atrophy or fasciculations  Motor: Right : Upper extremity   5/5    Left:     Upper extremity   5/5  Lower extremity   5/5     Lower extremity   5/5 Tone and bulk:normal tone throughout; no atrophy noted Sensory: Pinprick and light touch intact throughout, bilaterally Deep Tendon Reflexes:  Right: Upper Extremity   Left: Upper extremity   biceps (C-5 to C-6) 2/4   biceps (C-5 to C-6) 2/4 tricep (C7) 2/4    triceps (C7) 2/4 Brachioradialis (C6) 2/4  Brachioradialis (C6) 2/4  Lower Extremity Lower Extremity  quadriceps (L-2 to L-4) 3/4   quadriceps (L-2 to L-4) 3/4 Achilles (S1)  2/4   Achilles (S1) 2/4  Plantars: Right: downgoing   Left: downgoing   Lab Results: Basic Metabolic Panel:  Recent Labs Lab 03/10/15 2347 03/11/15 1213  NA 137 139  K 3.5 3.8  CL 103 105  CO2 26 23  GLUCOSE 101* 159*  BUN 8 6  CREATININE 1.00 0.88  0.94  CALCIUM 8.7* 9.6    Liver Function Tests: No results for input(s): AST, ALT, ALKPHOS, BILITOT, PROT, ALBUMIN in the last 168 hours. No results for input(s): LIPASE, AMYLASE in the last 168 hours. No results for input(s): AMMONIA in the last 168 hours.  CBC:  Recent Labs Lab 03/10/15 2347 03/11/15 1213  WBC 4.8 5.5  5.2  NEUTROABS 1.9  --   HGB 13.0 13.1  13.2  HCT 38.2* 39.1  39.2  MCV 85.3 85.2  85.2  PLT 245 252  252    Cardiac Enzymes: No results for input(s): CKTOTAL, CKMB, CKMBINDEX, TROPONINI in the last 168 hours.  Lipid Panel: No results for input(s): CHOL, TRIG, HDL, CHOLHDL, VLDL, LDLCALC in the last 168 hours.  CBG: No results for input(s): GLUCAP in the last 168 hours.  Microbiology: No results found for this or any previous visit.  Coagulation Studies: No results for input(s): LABPROT, INR in the last 72 hours.  Imaging: No results found.  Medications:  Scheduled: . DULoxetine  30 mg Oral Daily  . enoxaparin (LOVENOX) injection  40 mg Subcutaneous Q24H  . methylPREDNISolone (SOLU-MEDROL) injection  1,000 mg Intravenous Daily  . pantoprazole  40 mg Oral Daily  Assessment/Plan: 29 YO male S/P 3 doses Solumedrol and showing significant improvement. patient has 2 further doses and CIR is looking into taking patient. Continue Solumedrol for total of 5 doses. No further recommendation.     Babu Gohn PA-C [facility] [phone]  03/13/2015, 10:33 AM

## 2015-03-13 NOTE — Progress Notes (Signed)
Physical Therapy Treatment Patient Details Name: Mike Lowery MRN: [id number] DOB: 06-Nov-1985 Today's Date: 03/13/2015    History of Present Illness 29 y.o. male with history of depression due to multiple sclerosis, admitted for multiple sclerosis exacerbation.    PT Comments    Pt progressing towards physical therapy goals. Was able to tolerate higher level balance activity during gait training and required almost constant min assist for balance in addition to UE support on railings during braiding and side stepping. Pt appeared fatigued at end of session, however states he is not tired. Feel this pt would benefit from an OT consult, as he was unable to don his shoes at beginning of session, and decided against wearing them instead of letting therapist assist him. Continue to recommend CIR at d/c to maximize independence with functional mobility at d/c.   Follow Up Recommendations  CIR     Equipment Recommendations  Other (comment) (TBD)    Recommendations for Other Services Rehab consult;OT consult     Precautions / Restrictions Precautions Precautions: Fall Restrictions Weight Bearing Restrictions: No    Mobility  Bed Mobility Overal bed mobility: Modified Independent             General bed mobility comments: extra time  Transfers Overall transfer level: Needs assistance Equipment used: None Transfers: Sit to/from Stand Sit to Stand: Supervision         General transfer comment: Appears unsteady. Reaches for furniture for balance.  Ambulation/Gait Ambulation/Gait assistance: Min assist Ambulation Distance (Feet): 200 Feet Assistive device: None Gait Pattern/deviations: Step-through pattern;Decreased stride length;Trunk flexed;Narrow base of support Gait velocity: decreased Gait velocity interpretation: Below normal speed for age/gender General Gait Details: Pt continues to circumduct on LLE during advancement. With cueing, pt able to swing through  without circumduction, however appears very antalgic and labored. Occasional min assist provided for basic ambulation, and frequent min assist provided for higher level balance activity while ambulating.    Stairs Stairs: Yes Stairs assistance: Min assist Stair Management: No rails Number of Stairs: 10 General stair comments: Min assist for balance, and heavy min assist for one LOB during descent. Pt was cued for sequencing and technique, and turned 45 during descent.  Wheelchair Mobility    Modified Rankin (Stroke Patients Only)       Balance Overall balance assessment: Needs assistance Sitting-balance support: No upper extremity supported;Feet supported Sitting balance-Leahy Scale: Good     Standing balance support: No upper extremity supported;During functional activity Standing balance-Leahy Scale: Fair Standing balance comment: Assist required for higher level balance activity.              High level balance activites: Side stepping;Braiding;Direction changes;Turns High Level Balance Comments: Pt with use of rails for support as well as heavy min assist to recover during balance activity - especially during braiding. Was able to demonstrate all tasks to the R side and then to the L side.     Cognition Arousal/Alertness: Awake/alert Behavior During Therapy: WFL for tasks assessed/performed Overall Cognitive Status: Within Functional Limits for tasks assessed                      Exercises      General Comments        Pertinent Vitals/Pain Pain Assessment: No/denies pain    Home Living                      Prior Function  PT Goals (current goals can now be found in the care plan section) Acute Rehab PT Goals Patient Stated Goal: Go to rehab and get better PT Goal Formulation: With patient Time For Goal Achievement: 03/25/15 Potential to Achieve Goals: Good Progress towards PT goals: Progressing toward goals     Frequency  Min 4X/week    PT Plan Current plan remains appropriate    Co-evaluation             End of Session Equipment Utilized During Treatment: Gait belt Activity Tolerance: Patient tolerated treatment well Patient left: in chair;with call bell/phone within reach     Time: 0845-0908 PT Time Calculation (min) (ACUTE ONLY): 23 min  Charges:  $Gait Training: 23-37 mins                    G Codes:      Conni Slipper March 27, 2015, 11:01 AM   Conni Slipper, PT, DPT Acute Rehabilitation Services Pager: [phone]

## 2015-03-13 NOTE — Progress Notes (Signed)
Rehab admissions - Currently no rehab beds available.  Will continue to follow progress.  #[phone]

## 2015-03-14 MED ORDER — SODIUM CHLORIDE 0.9 % IV SOLN: 1000.0000 mg | Freq: Every day | INTRAVENOUS | Status: DC

## 2015-03-14 NOTE — Evaluation (Signed)
Occupational Therapy Evaluation Patient Details Name: Mike Lowery MRN: [id number] DOB: May 29, 1985 Today's Date: 03/14/2015    History of Present Illness 29 y.o. male with history of depression due to multiple sclerosis, admitted for 1.  Multiple sclerosis exacerbation.   Clinical Impression   Pt admitted to hospital due to reason stated above. Pt currently with functional limitiations due to the deficits listed below (see OT problem list). Prior to admission pt was independent with ADLs. Pt currently requires supervision to min guard assistance for safety with ADLs. Pt completed BERG balance assessment regarding his balance for functional mobility and ADL safety. Pt score was a 48, indicating pt is at a moderate fall risk. Pt will benefit from skilled OT to increase his independence and safety with ADLs and balance to allow safe discharge home.    Follow Up Recommendations  Outpatient OT    Equipment Recommendations  3 in 1 bedside comode    Recommendations for Other Services       Precautions / Restrictions Precautions Precautions: Fall Restrictions Weight Bearing Restrictions: No      Mobility Bed Mobility Overal bed mobility: Independent             General bed mobility comments: Pt went from supine to long sitting in bed and then pivoted hips to the right to sit EOB.  Transfers Overall transfer level: Needs assistance Equipment used: None Transfers: Sit to/from Stand Sit to Stand: Supervision              Balance Overall balance assessment: Needs assistance Sitting-balance support: No upper extremity supported;Feet supported Sitting balance-Leahy Scale: Good     Standing balance support: No upper extremity supported;During functional activity Standing balance-Leahy Scale: Fair Standing balance comment: Pt able to stand at sink to perform grooming task without UE support, however assist required for higher level balance activity                  Standardized Balance Assessment Standardized Balance Assessment : Berg Balance Test Berg Balance Test Sit to Stand: Able to stand without using hands and stabilize independently Standing Unsupported: Able to stand 2 minutes with supervision (therapist noted swaying back and forth) Sitting with Back Unsupported but Feet Supported on Floor or Stool: Able to sit safely and securely 2 minutes Stand to Sit: Sits safely with minimal use of hands Transfers: Able to transfer safely, minor use of hands Standing Unsupported with Eyes Closed: Able to stand 10 seconds with supervision (therapist noted pt swaying back and forth) Standing Ubsupported with Feet Together: Able to place feet together independently and stand for 1 minute with supervision (therapist noted pt swaying to left and right) From Standing, Reach Forward with Outstretched Arm: Can reach forward >12 cm safely (5") From Standing Position, Pick up Object from Floor: Able to pick up shoe, needs supervision (slight LOB noted, however able to regain balance without physical assistance) From Standing Position, Turn to Look Behind Over each Shoulder: Looks behind from both sides and weight shifts well Turn 360 Degrees: Able to turn 360 degrees safely one side only in 4 seconds or less Standing Unsupported, Alternately Place Feet on Step/Stool: Able to stand independently and complete 8 steps >20 seconds Standing Unsupported, One Foot in Front: Able to place foot tandem independently and hold 30 seconds Standing on One Leg: Able to lift leg independently and hold 5-10 seconds Total Score: 48        ADL Overall ADL's : Needs assistance/impaired Eating/Feeding: Independent;Sitting  Grooming: Wash/dry face;Oral care;Min guard;Standing   Upper Body Bathing: Supervision/ safety;Sitting   Lower Body Bathing: Supervison/ safety;Sitting/lateral leans;Sit to/from stand   Upper Body Dressing : Supervision/safety;Sitting   Lower Body  Dressing: Supervision/safety;Sitting/lateral leans;Sit to/from stand Lower Body Dressing Details (indicate cue type and reason): pt able to don bil LE shoes while sitting EOB with no assistance or verbal cues Toilet Transfer: Min guard;Ambulation;Comfort height toilet   Toileting- Clothing Manipulation and Hygiene: Supervision/safety;Sitting/lateral lean;Sit to/from stand   Tub/ Shower Transfer: Supervision/safety;Ambulation   Functional mobility during ADLs: Min guard General ADL Comments: Therapist recommended pt sit to complete most ADLs and stand when appropriate, pt agreeable to therapist recommendation     Vision     Perception     Praxis      Pertinent Vitals/Pain Pain Assessment: 0-10 Pain Score: 4  Pain Location: Rt hand & Lt foot Pain Descriptors / Indicators: Aching Pain Intervention(s): Monitored during session;Repositioned     Hand Dominance Right   Extremity/Trunk Assessment Upper Extremity Assessment Upper Extremity Assessment: Overall WFL for tasks assessed   Lower Extremity Assessment Lower Extremity Assessment: Defer to PT evaluation   Cervical / Trunk Assessment Cervical / Trunk Assessment: Normal   Communication Communication Communication: No difficulties   Cognition Arousal/Alertness: Awake/alert Behavior During Therapy: WFL for tasks assessed/performed;Flat affect Overall Cognitive Status: Within Functional Limits for tasks assessed                     General Comments       Exercises       Shoulder Instructions      Home Living Family/patient expects to be discharged to:: Private residence Living Arrangements: Spouse/significant other Available Help at Discharge: Family;Available PRN/intermittently Type of Home: Mobile home Home Access: Stairs to enter Entrance Stairs-Number of Steps: 7 Entrance Stairs-Rails: Can reach both Home Layout: One level     Bathroom Shower/Tub: Producer, television/film/video: Standard      Home Equipment: None          Prior Functioning/Environment Level of Independence: Independent             OT Diagnosis: Generalized weakness;Acute pain   OT Problem List: Decreased activity tolerance;Impaired balance (sitting and/or standing);Decreased safety awareness;Pain   OT Treatment/Interventions: Self-care/ADL training;Therapeutic exercise;Neuromuscular education;Therapeutic activities;Patient/family education;Balance training    OT Goals(Current goals can be found in the care plan section) Acute Rehab OT Goals Patient Stated Goal: improve balance OT Goal Formulation: With patient Time For Goal Achievement: 03/28/15 Potential to Achieve Goals: Good ADL Goals Pt Will Perform Grooming: Independently;standing Pt Will Perform Lower Body Bathing: with modified independence;sit to/from stand Pt Will Perform Lower Body Dressing: Independently;sit to/from stand Pt Will Transfer to Toilet: Independently;ambulating;regular height toilet Pt Will Perform Toileting - Clothing Manipulation and hygiene: Independently;sit to/from stand Pt Will Perform Tub/Shower Transfer: Shower transfer;with modified independence;ambulating;3 in 1 Additional ADL Goal #1: Pt will score a 52 or higher on BERG balance assessment to decrease risk of falling while performing ADLs.  OT Frequency: Min 2X/week   Barriers to D/C:            Co-evaluation              End of Session Equipment Utilized During Treatment: Gait belt Nurse Communication: Mobility status  Activity Tolerance: Patient tolerated treatment well Patient left: in bed;with call bell/phone within reach;with nursing/sitter in room (sitting EOB)   Time: 7829-5621 OT Time Calculation (min): 30 min Charges:  OT General  Charges $OT Visit: 1 Procedure OT Evaluation $Initial OT Evaluation Tier I: 1 Procedure OT Treatments $Self Care/Home Management : 8-22 mins G-Codes:    Smiley Houseman 03/21/15, 11:37 AM

## 2015-03-14 NOTE — Progress Notes (Signed)
Pt IV to left forearm found leaking blood. Site assessed, noted bleeding unable to flush. Catheter intact. IV removed, dry dressing applied. Pt denied pain or discomfort. Attempted to restart but was unsuccessful. IV team consult order placed per MD for last dose of Solumedrol.

## 2015-03-14 NOTE — Progress Notes (Signed)
Rehab admissions - Spoke with OT and patient was made independent in the room today.  Spoke with rehab team and feel patient is now doing too well to meet criteria for acute inpatient rehab admission.  Recommend HH or outpatient therapy once medically ready for discharge.  MD and case manager are aware of progress.  Will not admit to inpatient rehab since he is doing so well.  #[phone]

## 2015-03-14 NOTE — Progress Notes (Signed)
Pt discharged to home with wife via car.  Pt ambulated with nurse tech to hospital entrance.  Vitals and assessment stable, daily medications and discharge instructions given to patient.

## 2015-03-14 NOTE — Discharge Summary (Addendum)
Physician Discharge Summary  Mike Lowery FAO:[id number] DOB: 1986-02-28 DOA: 03/10/2015  PCP: Jeanann Lewandowsky, MD  Admit date: 03/10/2015 Discharge date: 03/14/2015  Time spent: 20 minutes  Recommendations for Outpatient Follow-up:  1. Patient to continue MS medications and follow with Neurologist Dr. Everlena Cooper as OP  2. Recommend screening for depression and further other age-appropriate [place] PTF screening 3. 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 Everlena Cooper), initiated on interferon but stopped due to side effects presented to the ED with increasing weakness of his left extremities for the past 2 days. Patient admitted to hospitalist service and neurology consulted for MS flare.     Hospital Course:  Acute flareup of multiple sclerosis Started on high-dose IV Solu-Medrol--was given a burst of Solu-Medrol 1 g daily and will complete final dose on 10/27 prior to d/c home Continue Baclofen 10 tid Neurology following and recommended further follow-up Seen by physical therapy and too well for d/c to Rehab-will d/c home No s/s hyperglycemia Would continue an Gilenya until follows with outpatient neurologist  History of depression Continue cymbalata 30 daily. Denies symptoms at this time.  DVT prophylaxis: Subcutaneous Lovenox  Diet: Regular   Consultations:  Neurology  Discharge Exam: Filed Vitals:   03/14/15 0936  BP: 116/59  Pulse: 63  Temp: 98.2 F (36.8 C)  Resp: 18   Alert working with therapy no specific issues or complaints Tolerating diet Strength seems better and is able to stand on  tiptoes   General: EOMI NCAT Cardiovascular: S1-S2 no murmur rub or gallop Respiratory: Clinically clear  Neuro exam deferred as working with therapy  Discharge Instructions   Discharge Instructions    Diet - low sodium heart healthy    Complete by:  As directed      Increase activity slowly    Complete by:  As directed           Current Discharge Medication List    START taking these medications   Details  methylPREDNISolone sodium succinate 1,000 mg in sodium chloride 0.9 % 50 mL Inject 1,000 mg into the vein daily. Qty: 1 Dose, Refills: 0      CONTINUE these medications which have NOT CHANGED   Details  baclofen (LIORESAL) 10 MG tablet Take 1 tablet (10 mg total) by mouth 3 (three) times daily as needed for muscle spasms. Qty: 90 each, Refills: 0    DULoxetine (CYMBALTA) 30 MG capsule Take 1 capsule (30 mg total) by mouth daily. 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 Qty: 30 tablet, Refills: 0   Associated Diagnoses: Multiple sclerosis (HCC)    Fingolimod HCl (GILENYA) 0.5 MG CAPS Take 1 capsule (0.5 mg total) by mouth daily. Qty: 30 capsule, Refills: 0   Associated Diagnoses: Multiple sclerosis (HCC)    ondansetron (ZOFRAN) 4 MG tablet Take 1 tablet (4 mg total) by mouth every 8 (eight) hours as needed for nausea or vomiting. Qty: 30 tablet, Refills: 0   Associated Diagnoses:  Nausea without vomiting    pantoprazole (PROTONIX) 40 MG tablet Take 1 tablet (40 mg total) by mouth daily. Qty: 14 tablet, Refills: 0    predniSONE (DELTASONE) 20 MG tablet 3 Tabs PO daily for 5 days (days 1 - 5), then 2 tabs PO days (days 6 - 10), then 1 tab PO daily for 10 days (days 11 - 20), then Half Tab PO daily from day 21 Qty: 60 tablet, Refills: 0    Vitamin D, Ergocalciferol, (DRISDOL) 50000 UNITS CAPS  capsule Take 1 capsule (50,000 Units total) by mouth every 7 (seven) days. For 8 weeks Qty: 10 capsule, Refills: 0      STOP taking these medications     methylPREDNISolone sodium succinate (SOLU-MEDROL) 125 mg/2 mL injection        No Known Allergies    The results of significant diagnostics from this hospitalization (including imaging, microbiology, ancillary and laboratory) are listed below for reference.    Significant Diagnostic Studies: Mr Laqueta Jean Wo Contrast  03-03-15  CLINICAL DATA:  Relapsing remitting multiple sclerosis EXAM: MRI HEAD WITHOUT AND WITH CONTRAST MRI CERVICAL SPINE WITHOUT AND WITH CONTRAST TECHNIQUE: Multiplanar, multiecho pulse sequences of the brain and surrounding structures, and cervical spine, to include the craniocervical junction and cervicothoracic junction, were obtained without and with intravenous contrast. CONTRAST:  13mL MULTIHANCE GADOBENATE DIMEGLUMINE 529 MG/ML IV SOLN COMPARISON:  MRI 12/24/2013 FINDINGS: MRI HEAD FINDINGS Ventricle size normal. Cerebral volume normal. Craniocervical junction normal. Pituitary normal in size. Multiple lesions are present in the white matter compatible with the given diagnosis of multiple sclerosis. Several of these have a typical perpendicular orientation to the ventricles of multiple sclerosis. Right frontal periventricular white matter lesions are slightly improved. New lesion in the left temporoparietal lobe which shows mild enhancement. New small lesion with enhancement in the left posterior insula. Small new lesion in the left posterior temporal lobe FLAIR axial image number 10 with mild enhancement New lesions without enhancement in the brachium pontis bilaterally. Negative for acute infarct. Diffusion-weighted imaging is negative for restricted diffusion. Mucous retention cyst left maxillary sinus. MRI CERVICAL SPINE FINDINGS Extensive plaque formation in the cervical cord from C2 through the upper thoracic spine.  Numerous lesions are present which show decreased T2 hyperintensity since the prior study. Mild cord swelling and enhancement at C5 has improved. No new lesions. No enhancing lesions are seen on today's study. Mild disc bulging at C5-6 and C6-7 without significant spinal stenosis or disc protrusion. IMPRESSION: Mixed response in the brain. Several lesions are improved however there are multiple new lesions some of which enhance in the left hemisphere. New lesions also in the brachium pontis bilaterally without enhancement Extensive plaque formation throughout the cervical spinal cord shows interval improvement since the prior study. No enhancing cord lesions are seen today. Electronically Signed   By: Marlan Palau M.D.   On: Mar 03, 2015 17:45   Mr Cervical Spine W Wo Contrast  2015-03-03  : Final Report CLINICAL DATA: Relapsing remitting multiple sclerosis EXAM: MRI HEAD WITHOUT AND WITH CONTRAST MRI CERVICAL SPINE WITHOUT AND WITH CONTRAST TECHNIQUE: Multiplanar, multiecho pulse sequences of the brain and surrounding structures, and cervical spine, to include the craniocervical junction and cervicothoracic junction, were obtained without and with intravenous contrast. CONTRAST: 13mL MULTIHANCE GADOBENATE DIMEGLUMINE 529 MG/ML IV SOLN COMPARISON: MRI 12/24/2013 FINDINGS: MRI HEAD FINDINGS Ventricle size normal. Cerebral volume normal. Craniocervical junction normal. Pituitary normal in size. Multiple lesions are present in the white matter compatible with the  given diagnosis of multiple sclerosis. Several of these have a typical perpendicular orientation to the ventricles of multiple sclerosis. Right frontal periventricular white matter lesions are slightly improved. New lesion in the left temporoparietal lobe which shows mild enhancement. New small lesion with enhancement in the left posterior insula. Small new lesion in the left posterior temporal lobe FLAIR axial image number 10 with mild enhancement New  lesions without enhancement in the brachium pontis bilaterally. Negative for acute infarct. Diffusion-weighted imaging is negative for restricted diffusion. Mucous retention cyst left maxillary sinus. MRI CERVICAL SPINE FINDINGS Extensive plaque formation in the cervical cord from C2 through the upper thoracic spine. Numerous lesions are present which show decreased T2 hyperintensity since the prior study. Mild cord swelling and enhancement at C5 has improved. No new lesions. No enhancing lesions are seen on today's study. Mild disc bulging at C5-6 and C6-7 without significant spinal stenosis or disc protrusion. IMPRESSION: Mixed response in the brain. Several lesions are improved however there are multiple new lesions some of which enhance in the left hemisphere. New lesions also in the brachium pontis bilaterally without enhancement Extensive plaque formation throughout the cervical spinal cord shows interval improvement since the prior study. No enhancing cord lesions are seen today. Electronically Signed By: Marlan Palau M.D. On: 02/21/2015 17:45 Electronically Signed   By: Marlan Palau M.D.   On: 02/21/2015 18:58    Microbiology: No results found for this or any previous visit (from the past 240 hour(s)).   Labs: Basic Metabolic Panel:  Recent Labs Lab 03/10/15 2347 03/11/15 1213  NA 137 139  K 3.5 3.8  CL 103 105  CO2 26 23  GLUCOSE 101* 159*  BUN 8 6  CREATININE 1.00 0.88  0.94  CALCIUM 8.7* 9.6   Liver Function Tests: No results for input(s): AST, ALT, ALKPHOS, BILITOT, PROT, ALBUMIN in the last 168 hours. No results for input(s): LIPASE, AMYLASE in the last 168 hours. No results for input(s): AMMONIA in the last 168 hours. CBC:  Recent Labs Lab 03/10/15 2347 03/11/15 1213  WBC 4.8 5.5  5.2  NEUTROABS 1.9  --   HGB 13.0 13.1  13.2  HCT 38.2* 39.1  39.2  MCV 85.3 85.2  85.2  PLT 245 252  252   Cardiac Enzymes: No results for input(s): CKTOTAL, CKMB, CKMBINDEX,  TROPONINI in the last 168 hours. BNP: BNP (last 3 results) No results for input(s): BNP in the last 8760 hours.  ProBNP (last 3 results) No results for input(s): PROBNP in the last 8760 hours.  CBG: No results for input(s): GLUCAP in the last 168 hours.     SignedRhetta Mura  [facility] 03/14/2015, 10:06 AM

## 2015-03-14 NOTE — Progress Notes (Signed)
Solumedrol administered. Pt stated that he doesn't have a ride. Awaiting ride home for discharge. No noted distress. He denies pain or discomfort.

## 2015-03-15 ENCOUNTER — Telehealth: Payer: Self-pay

## 2015-03-15 DIAGNOSIS — G35 Multiple sclerosis: Secondary | ICD-10-CM

## 2015-03-15 NOTE — Telephone Encounter (Signed)
PT called and was asking if some paper work was ready for him for pick up/[name] CB# [phone]

## 2015-03-15 NOTE — Telephone Encounter (Signed)
Pt calling with concerns about PT, Work, and starting his Gilenya.    PT: Pt states he need PT set up because hospital was unable to do so. From hospital Progress note, "Rehab admissions - Spoke with OT and patient was made independent in the room today. Spoke with rehab team and feel patient is now doing too well to meet criteria for acute inpatient rehab admission. Recommend HH or outpatient therapy once medically ready for discharge."  Work: Sounds like patient is wanting to return to work? He asked what type of work he was suited for? Unsure of exactly what patient is asking. Left a message for him to call me back for clarity on issue. Will follow up on this issue.   Gilenya: Pt very anxious to start medication. Explained that we needed to have all baselines prior to starting. Encouraged pt to reach out to Dr. Alben Spittle @ [facility], to help expedite scheduling his appointment.

## 2015-03-18 NOTE — Telephone Encounter (Signed)
Please see below. Needs work Physicist, medical and PT okay to initiate?

## 2015-03-18 NOTE — Telephone Encounter (Signed)
Pt is calling to check on the status of the paperwork for his job and refills on his rx and home rehab please call [phone]

## 2015-03-18 NOTE — Telephone Encounter (Signed)
yes

## 2015-03-18 NOTE — Telephone Encounter (Signed)
Spoke with patient. He would like a letter stating he can return to work without limitations. Pt feels like he is capable of doing so. He would also like a letter for his job stating that he is in the process of switching medications.   Gilenya rep did react out to patient. It was while he was still hosptialized. Pt states he is unsure of next step. Have left  A message with Tim, Gilenya rep.

## 2015-03-19 NOTE — Telephone Encounter (Signed)
Letter's at front desk for pickup, along with [employer]. Info. Patient aware.

## 2015-03-22 ENCOUNTER — Emergency Department (HOSPITAL_COMMUNITY)
Admission: EM | Admit: 2015-03-22 | Discharge: 2015-03-23 | Disposition: A | Discharge status: Home / Self Care | Payer: Medicaid Other | Attending: Emergency Medicine | Admitting: Emergency Medicine

## 2015-03-22 ENCOUNTER — Encounter (HOSPITAL_COMMUNITY): Payer: Self-pay | Admitting: Emergency Medicine

## 2015-03-22 DIAGNOSIS — Z79899 Other long term (current) drug therapy: Secondary | ICD-10-CM | POA: Diagnosis not present

## 2015-03-22 DIAGNOSIS — R200 Anesthesia of skin: Secondary | ICD-10-CM

## 2015-03-22 DIAGNOSIS — Z720 Tobacco use: Secondary | ICD-10-CM | POA: Diagnosis not present

## 2015-03-22 NOTE — ED Notes (Signed)
Pt denies any change in bowel or bladder function.  

## 2015-03-22 NOTE — ED Notes (Signed)
Pt. reports left leg and bilateral hands numbness onset yesterday , denies injury , ambulatory / no fever .

## 2015-03-22 NOTE — ED Notes (Signed)
Pt denies any type of possible injury to neck.

## 2015-03-22 NOTE — ED Provider Notes (Signed)
CSN: [id number]     Arrival date & time 03/22/15  2238 History  By signing my name below, I, Terrance Branch, attest that this documentation has been prepared under the direction and in the presence of Loren Racer, MD. Electronically Signed: Evon Slack, ED Scribe. 03/23/2015. 2:17 AM.     Chief Complaint  Patient presents with  . Numbness   The history is provided by the patient. No language interpreter was used.   HPI Comments: Mike Lowery is a 29 y.o. male who presents to the Emergency Department complaining of numbness to his bilateral hands onset 1 day prior Pt states that he thinks he is having a MS flare. Pt states that he has noticed that his left leg is dragging as well. Pt states that his vision is "so so" He states that that in dark rooms it is hard for him to see. Pt denies being on any medications currently for his MS. Recent hospital admission for MS exacerbation. Complete full course of IV steroids. Has yet to follow-up with his neurologist.  Past Medical History  Diagnosis Date  . Multiple sclerosis (HCC) 12/24/13  . Multiple sclerosis [facility])    Past Surgical History  Procedure Laterality Date  . No past surgeries     Family History  Problem Relation Age of Onset  . Cancer Maternal Grandmother     unknown   . Multiple sclerosis Neg Hx    Social History  Substance Use Topics  . Smoking status: Current Every Day Smoker    Types: Cigarettes  . Smokeless tobacco: Former Neurosurgeon    Quit date: 09/08/2014  . Alcohol Use: No     Comment: 8 oz liquor q other day social     Review of Systems  Constitutional: Negative for fever and chills.  Respiratory: Negative for shortness of breath.   Cardiovascular: Negative for chest pain.  Gastrointestinal: Negative for nausea, vomiting and abdominal pain.  Musculoskeletal: Negative for back pain, neck pain and neck stiffness.  Skin: Negative for rash and wound.  Neurological: Positive for numbness. Negative for  dizziness, speech difficulty, light-headedness and headaches.  All other systems reviewed and are negative.     Allergies  Review of patient's allergies indicates no known allergies.  Home Medications   Prior to Admission medications   Medication Sig Start Date End Date Taking? Authorizing Provider  baclofen (LIORESAL) 10 MG tablet Take 1 tablet (10 mg total) by mouth 3 (three) times daily as needed for muscle spasms. 06/26/14  Yes Adam Mliss Fritz, DO  acetaminophen-codeine (TYLENOL #3) 300-30 MG per tablet Take 1 tablet by mouth every 4 (four) hours as needed. Patient not taking: Reported on 03/06/2015 11/08/14   Quentin Angst, MD  DULoxetine (CYMBALTA) 30 MG capsule Take 1 capsule (30 mg total) by mouth daily. Patient not taking: Reported on 03/22/2015 11/08/14   Quentin Angst, MD  Fingolimod HCl (GILENYA) 0.5 MG CAPS Take 1 capsule (0.5 mg total) by mouth daily. Patient not taking: Reported on 03/22/2015 03/11/15   Drema Dallas, DO  methylPREDNISolone sodium succinate 1,000 mg in sodium chloride 0.9 % 50 mL Inject 1,000 mg into the vein daily. Patient not taking: Reported on 03/22/2015 03/14/15   Rhetta Mura, MD  ondansetron (ZOFRAN) 4 MG tablet Take 1 tablet (4 mg total) by mouth every 8 (eight) hours as needed for nausea or vomiting. Patient not taking: Reported on 03/22/2015 11/08/14   Quentin Angst, MD  pantoprazole (PROTONIX) 40 MG tablet  Take 1 tablet (40 mg total) by mouth daily. Patient not taking: Reported on 10/01/2014 12/27/13   Dow Adolph, MD  predniSONE (DELTASONE) 20 MG tablet 3 Tabs PO daily for 5 days (days 1 - 5), then 2 tabs PO days (days 6 - 10), then 1 tab PO daily for 10 days (days 11 - 20), then Half Tab PO daily from day 21 Patient not taking: Reported on 10/01/2014 09/13/14   Quentin Angst, MD  Vitamin D, Ergocalciferol, (DRISDOL) 50000 UNITS CAPS capsule Take 1 capsule (50,000 Units total) by mouth every 7 (seven) days. For 8 weeks Patient  not taking: Reported on 03/10/2015 10/23/14   Donika K Patel, DO   BP 105/64 mmHg  Pulse 70  Temp(Src) 97.7 F (36.5 C) (Oral)  Resp 18  SpO2 97%   Physical Exam  Constitutional: He is oriented to person, place, and time. He appears well-developed and well-nourished. No distress.  HENT:  Head: Normocephalic and atraumatic.  Mouth/Throat: Oropharynx is clear and moist.  Eyes: EOM are normal. Pupils are equal, round, and reactive to light.  Neck: Normal range of motion. Neck supple.  Cardiovascular: Normal rate and regular rhythm.   Pulmonary/Chest: Effort normal and breath sounds normal. No respiratory distress. He has no wheezes. He has no rales. He exhibits no tenderness.  Abdominal: Soft. Bowel sounds are normal. He exhibits no distension and no mass. There is no tenderness. There is no rebound and no guarding.  Musculoskeletal: Normal range of motion. He exhibits no edema or tenderness.  Neurological: He is alert and oriented to person, place, and time.  We decrease sensation to the dorsum of bilateral hands. Patient also states he is having decreased sensation to light touch over the right foot. There is mild drift the left lower extremity compared to right. Equal grip strength bilaterally. Cranial nerves II through XII grossly intact.  Skin: Skin is warm and dry. No rash noted. No erythema.  Psychiatric: He has a normal mood and affect. His behavior is normal.  Nursing note and vitals reviewed.   ED Course  Procedures (including critical care time) DIAGNOSTIC STUDIES: Oxygen Saturation is 97% on RA, normal by my interpretation.    COORDINATION OF CARE: 11:57 PM-Discussed treatment plan with pt at bedside and pt agreed to plan.     Labs Review Labs Reviewed  COMPREHENSIVE METABOLIC PANEL - Abnormal; Notable for the following:    Total Protein 6.0 (*)    Total Bilirubin 0.1 (*)    All other components within normal limits  URINALYSIS, ROUTINE W REFLEX MICROSCOPIC (NOT AT  [facility]) - Abnormal; Notable for the following:    Color, Urine AMBER (*)    Specific Gravity, Urine 1.034 (*)    Bilirubin Urine SMALL (*)    Ketones, ur 15 (*)    All other components within normal limits  CBC WITH DIFFERENTIAL/PLATELET    Imaging Review No results found.    EKG Interpretation None      MDM   Final diagnoses:  Numbness      I personally performed the services described in this documentation, which was scribed in my presence. The recorded information has been reviewed and is accurate.   Discussed with neurology, Dr Hosie Poisson. States given the patient has had a recent full course of high-dose IV steroids, he should follow-up with his neurologist as an outpatient. Will check basic labs, UA.     Loren Racer, MD 03/23/15 [phone]

## 2015-03-22 NOTE — ED Notes (Signed)
MD at bedside. 

## 2015-03-23 LAB — CBC WITH DIFFERENTIAL/PLATELET
BASOS ABS: 0 10*3/uL (ref 0.0–0.1)
BASOS PCT: 0 %
EOS ABS: 0.1 10*3/uL (ref 0.0–0.7)
EOS PCT: 3 %
HCT: 39.8 % (ref 39.0–52.0)
HEMOGLOBIN: 13.4 g/dL (ref 13.0–17.0)
Lymphocytes Relative: 31 %
Lymphs Abs: 1.7 10*3/uL (ref 0.7–4.0)
MCH: 29.3 pg (ref 26.0–34.0)
MCHC: 33.7 g/dL (ref 30.0–36.0)
MCV: 86.9 fL (ref 78.0–100.0)
Monocytes Absolute: 1 10*3/uL (ref 0.1–1.0)
Monocytes Relative: 17 %
NEUTROS PCT: 49 %
Neutro Abs: 2.7 10*3/uL (ref 1.7–7.7)
PLATELETS: 268 10*3/uL (ref 150–400)
RBC: 4.58 MIL/uL (ref 4.22–5.81)
RDW: 14 % (ref 11.5–15.5)
WBC: 5.6 10*3/uL (ref 4.0–10.5)

## 2015-03-23 LAB — COMPREHENSIVE METABOLIC PANEL
ALT: 38 U/L (ref 17–63)
AST: 23 U/L (ref 15–41)
Albumin: 3.7 g/dL (ref 3.5–5.0)
Alkaline Phosphatase: 59 U/L (ref 38–126)
Anion gap: 11 (ref 5–15)
BILIRUBIN TOTAL: 0.1 mg/dL — AB (ref 0.3–1.2)
BUN: 11 mg/dL (ref 6–20)
CO2: 26 mmol/L (ref 22–32)
CREATININE: 0.87 mg/dL (ref 0.61–1.24)
Calcium: 9.1 mg/dL (ref 8.9–10.3)
Chloride: 103 mmol/L (ref 101–111)
Glucose, Bld: 79 mg/dL (ref 65–99)
Potassium: 4.2 mmol/L (ref 3.5–5.1)
Sodium: 140 mmol/L (ref 135–145)
TOTAL PROTEIN: 6 g/dL — AB (ref 6.5–8.1)

## 2015-03-23 LAB — URINALYSIS, ROUTINE W REFLEX MICROSCOPIC
GLUCOSE, UA: NEGATIVE mg/dL
Hgb urine dipstick: NEGATIVE
KETONES UR: 15 mg/dL — AB
LEUKOCYTES UA: NEGATIVE
NITRITE: NEGATIVE
PROTEIN: NEGATIVE mg/dL
Specific Gravity, Urine: 1.034 — ABNORMAL HIGH (ref 1.005–1.030)
UROBILINOGEN UA: 1 mg/dL (ref 0.0–1.0)
pH: 6 (ref 5.0–8.0)

## 2015-03-23 NOTE — Discharge Instructions (Signed)
Call and make an appointment to follow-up with your neurologist.

## 2015-03-26 ENCOUNTER — Telehealth: Payer: Self-pay | Admitting: Neurology

## 2015-03-26 NOTE — Telephone Encounter (Signed)
Returned call to patient. States he went to work on Thursday and was told that they cannot use him, due to his current medical condition. Questioned patient about why he thought he was able to return to work as usual last week, pt stated at that time he thought he would be able to. Job disagreed. Pt did present to [employer] on Friday. Would like a work note. Please advise.

## 2015-03-26 NOTE — Telephone Encounter (Signed)
VM-PT left a message for a call back and did not say why/[name] CB# [phone]

## 2015-03-26 NOTE — Telephone Encounter (Signed)
Pt will stop by tomorrow to have blood work done and pick up some paperwork.

## 2015-03-26 NOTE — Telephone Encounter (Signed)
I want to send him for a functional capacity assessment to determine what he can and cannot do.  In the meantime, what is his status regarding completing pre-requsites for starting Gilenya?  I would like him to try and start this medication right away.

## 2015-03-27 ENCOUNTER — Other Ambulatory Visit (INDEPENDENT_AMBULATORY_CARE_PROVIDER_SITE_OTHER): Payer: Medicaid Other

## 2015-03-27 ENCOUNTER — Other Ambulatory Visit: Payer: Self-pay | Admitting: *Deleted

## 2015-03-27 ENCOUNTER — Telehealth: Payer: Self-pay | Admitting: Neurology

## 2015-03-27 DIAGNOSIS — G35 Multiple sclerosis: Secondary | ICD-10-CM

## 2015-03-27 LAB — CBC WITH DIFFERENTIAL/PLATELET
BASOS ABS: 0 10*3/uL (ref 0.0–0.1)
Basophils Relative: 0.5 % (ref 0.0–3.0)
EOS ABS: 0.1 10*3/uL (ref 0.0–0.7)
EOS PCT: 2.1 % (ref 0.0–5.0)
HCT: 43.9 % (ref 39.0–52.0)
HEMOGLOBIN: 14.3 g/dL (ref 13.0–17.0)
LYMPHS ABS: 2.2 10*3/uL (ref 0.7–4.0)
Lymphocytes Relative: 45.2 % (ref 12.0–46.0)
MCHC: 32.7 g/dL (ref 30.0–36.0)
MCV: 87.7 fl (ref 78.0–100.0)
MONO ABS: 0.4 10*3/uL (ref 0.1–1.0)
Monocytes Relative: 9 % (ref 3.0–12.0)
NEUTROS PCT: 43.2 % (ref 43.0–77.0)
Neutro Abs: 2.1 10*3/uL (ref 1.4–7.7)
Platelets: 319 10*3/uL (ref 150.0–400.0)
RBC: 5 Mil/uL (ref 4.22–5.81)
RDW: 14 % (ref 11.5–15.5)
WBC: 4.9 10*3/uL (ref 4.0–10.5)

## 2015-03-27 LAB — COMPREHENSIVE METABOLIC PANEL
ALBUMIN: 4.3 g/dL (ref 3.5–5.2)
ALK PHOS: 63 U/L (ref 39–117)
ALT: 33 U/L (ref 0–53)
AST: 18 U/L (ref 0–37)
BUN: 12 mg/dL (ref 6–23)
CALCIUM: 9.5 mg/dL (ref 8.4–10.5)
CHLORIDE: 104 meq/L (ref 96–112)
CO2: 25 mEq/L (ref 19–32)
CREATININE: 0.94 mg/dL (ref 0.40–1.50)
GFR: 123.42 mL/min (ref >60.00)
Glucose, Bld: 101 mg/dL — ABNORMAL HIGH (ref 70–99)
POTASSIUM: 3.6 meq/L (ref 3.5–5.1)
Sodium: 139 mEq/L (ref 135–145)
Total Bilirubin: 0.3 mg/dL (ref 0.2–1.2)
Total Protein: 6.8 g/dL (ref 6.0–8.3)

## 2015-03-27 NOTE — Telephone Encounter (Signed)
Returned call. Left vm

## 2015-03-27 NOTE — Telephone Encounter (Signed)
VM-Message stated that their was a question regarding the disability paperwork and asked for a call back/[name] CB# [phone], could not understand the gentleman's name that called

## 2015-03-28 LAB — COMPREHENSIVE METABOLIC PANEL
ALT: 30 U/L (ref 9–46)
AST: 14 U/L (ref 10–40)
Albumin: 4.3 g/dL (ref 3.6–5.1)
Alkaline Phosphatase: 68 U/L (ref 40–115)
BUN: 12 mg/dL (ref 7–25)
CALCIUM: 9.3 mg/dL (ref 8.6–10.3)
CHLORIDE: 107 mmol/L (ref 98–110)
CO2: 24 mmol/L (ref 20–31)
Creat: 0.92 mg/dL (ref 0.60–1.35)
GLUCOSE: 98 mg/dL (ref 65–99)
POTASSIUM: 3.9 mmol/L (ref 3.5–5.3)
Sodium: 140 mmol/L (ref 135–146)
Total Bilirubin: 0.3 mg/dL (ref 0.2–1.2)
Total Protein: 6.6 g/dL (ref 6.1–8.1)

## 2015-03-29 LAB — CBC WITH DIFFERENTIAL/PLATELET

## 2015-03-29 LAB — VARICELLA ZOSTER ANTIBODY, IGM: VARICELLA ZOSTER AB IGM: 0 {ISR} (ref <0.91)

## 2015-04-01 ENCOUNTER — Telehealth: Payer: Self-pay | Admitting: Neurology

## 2015-04-01 NOTE — Telephone Encounter (Signed)
Spoke with patient, he will come to sign forms probably tomorrow. Told him to just ask for me at the front desk.

## 2015-04-01 NOTE — Telephone Encounter (Signed)
Pt states that he had blood work done and wants to know when he start his medicine please call [phone]

## 2015-04-01 NOTE — Telephone Encounter (Signed)
[employer] paperwork started. Called patient to come in and sign form. No answer. Will also need a copy of pt's insurance card.

## 2015-04-01 NOTE — Telephone Encounter (Signed)
I called and spoke with Mike Lowery.  His varicella zoster antibodies are negative.  Since he would need to be administered the 2-part series of Varivax, it will only take longer for him to start Gilenya.  Therefore, we will prescribe Tecfidera instead, so he can get started on a disease modifying agent ASAP.  His work sent him home because they told him he cannot work.  I want him to follow up with me ASAP so I can evaluate him.

## 2015-04-08 ENCOUNTER — Telehealth: Payer: Self-pay | Admitting: Neurology

## 2015-04-08 ENCOUNTER — Telehealth: Payer: Self-pay

## 2015-04-08 NOTE — Telephone Encounter (Signed)
Called and spoke to [employer] rep @ [phone]. They have reached out to [facility] (specialty pharmacy) to begin getting medication ready for pt. Spoke to pt on 04/05/15, was told it was not a good time for pt to talk and he asked for a call back. Per Renae Fickle ([employer] rep) two outgoing calls have been logged since then with no answer from patient.

## 2015-04-08 NOTE — Telephone Encounter (Signed)
Vickie Gilenya Med Nurse/ called concerning pt being taken off Gilenya?//call back @ [phone]

## 2015-04-08 NOTE — Telephone Encounter (Signed)
Will close pt's Gilenya account.

## 2015-04-16 ENCOUNTER — Telehealth: Payer: Self-pay | Admitting: Neurology

## 2015-04-17 ENCOUNTER — Telehealth: Payer: Self-pay | Admitting: Internal Medicine

## 2015-04-17 ENCOUNTER — Telehealth: Payer: Self-pay

## 2015-04-17 NOTE — Telephone Encounter (Signed)
Mother in law/CaseManger, Tana Coast, called. Feels pt is finally coming to terms with diagnosis. MIL is working hard to get patient into a [employer] fast streamer at [employer]. Patient has been depressed. Would like [place] to restart Cymbalta. Medication was not prescribed by [place]. Consulted Dr. Everlena Cooper who agreed that medication would be better managed by PCP or psychiatrist.   Felicia's number is [phone] Fax [phone]

## 2015-04-17 NOTE — Telephone Encounter (Signed)
Mike Lowery is an Scientist, physiological at [employer] and is advising [place] that patient is struggling with depression and is hoping that we can provide pt with a sample of DULoxetine (CYMBALTA) 30 MG capsule until he is able to obtain an appt with dr jegede next week.   Felicia also mentioned that she would like for him to be referred to the [facility] clinic a MS Specialist clinic and since he has Medicaid he would need to be referred.   Please follow up with case manager if you have any questions. Thank you.

## 2015-04-19 ENCOUNTER — Ambulatory Visit: Payer: Medicaid Other | Attending: Neurology

## 2015-04-19 DIAGNOSIS — R269 Unspecified abnormalities of gait and mobility: Secondary | ICD-10-CM | POA: Insufficient documentation

## 2015-04-19 DIAGNOSIS — R2681 Unsteadiness on feet: Secondary | ICD-10-CM | POA: Insufficient documentation

## 2015-04-19 DIAGNOSIS — R531 Weakness: Secondary | ICD-10-CM | POA: Diagnosis present

## 2015-04-19 DIAGNOSIS — R252 Cramp and spasm: Secondary | ICD-10-CM

## 2015-04-19 DIAGNOSIS — R258 Other abnormal involuntary movements: Secondary | ICD-10-CM | POA: Diagnosis present

## 2015-04-19 NOTE — Therapy (Addendum)
[facility] [address] [place], [place], [address] Phone: [phone]   Fax:  [phone]  Physical Therapy Evaluation  Patient Details  Name: Mike Lowery MRN: [id number] Date of Birth: [birth year] Referring Provider: Dr. Everlena Cooper  Encounter Date: 04/19/2015      PT End of Session - 04/19/15 1603    Visit Number 1   Number of Visits 12   Date for PT Re-Evaluation 07/18/15   Authorization Type Medicaid-waiting on auth   PT Start Time 1449   PT Stop Time 1538   PT Time Calculation (min) 49 min   Equipment Utilized During Treatment Gait belt   Activity Tolerance Patient tolerated treatment well   Behavior During Therapy [facility] for tasks assessed/performed      Past Medical History  Diagnosis Date  . Multiple sclerosis (HCC) 12/24/13  . Multiple sclerosis [facility])     Past Surgical History  Procedure Laterality Date  . No past surgeries      There were no vitals filed for this visit.  Visit Diagnosis:  Abnormality of gait - Plan: PT plan of care cert/re-cert  Spasticity - Plan: PT plan of care cert/re-cert  Weakness - Plan: PT plan of care cert/re-cert  Unsteadiness - Plan: PT plan of care cert/re-cert      Subjective Assessment - 04/19/15 1456    Subjective Pt with hisotry of MS and recent hospitalization 03/11/2015 for MS exacerbation. Pt reported increased L leg weakness and impaired balance during ambulation is why he went to the hospital. Pt reported leg weakness was better after the hospital but it is getting worse again. Pt reported he has constant N/T from chest distal to legs and in his hands. Pt reports he has difficulty balancing when standing and walking. 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   Patient is accompained by: Family member  girlfriend and dtr   Pertinent History Depression   Patient Stated Goals Be able to run and try to get stronger   Currently in Pain?  No/denies            [facility] PT Assessment - 04/19/15 0001    Assessment   Medical Diagnosis Relapsing-remitting MS and MS exacerbation   Referring Provider Dr. Everlena Cooper   Onset Date/Surgical Date 03/11/15   Hand Dominance Right   Prior Therapy Acute PT    Precautions   Precautions Fall   Precaution Comments based on DGI score   Restrictions   Weight Bearing Restrictions No   Balance Screen   Has the patient fallen in the past 6 months No   Has the patient had a decrease in activity level because of a fear of falling?  Yes   Is the patient reluctant to leave their home because of a fear of falling?  Yes   Home Environment   Living Environment Private residence   Living Arrangements Spouse/significant other  and girlfriend's brother, and pt's 3y/o dtr   Available Help at Discharge Family   Type of Home Mobile home   Home Access Stairs to enter   Entrance Stairs-Number of Steps 7   Entrance Stairs-Rails Can reach both   Home Layout One level   Home Equipment None   Prior Function   Level of Independence Independent   Vocation On disability   Cognition   Overall Cognitive Status Within Functional Limits for tasks assessed   Sensation   Light Touch Impaired Detail  pt reported hypersensitvity on  L UE/LE during light touch   Additional Comments N/T starting at chest and distal to B LEs, and hands   Coordination   Gross Motor Movements are Fluid and Coordinated No   Fine Motor Movements are Fluid and Coordinated No   Posture/Postural Control   Posture/Postural Control No significant limitations   Tone   Assessment Location Left Lower Extremity   ROM / Strength   AROM / PROM / Strength AROM;Strength   AROM   Overall AROM  Within functional limits for tasks performed   Strength   Overall Strength Deficits   Transfers   Transfers Sit to Stand;Stand to Sit   Sit to Stand 5: Supervision;With upper extremity assist;From chair/3-in-1   Stand to Sit 5: Supervision;With upper  extremity assist;To chair/3-in-1   Ambulation/Gait   Ambulation/Gait Yes   Ambulation/Gait Assistance 5: Supervision   Ambulation/Gait Assistance Details Pt noted to amb. with decr. wt. shift to L LE.   Ambulation Distance (Feet) 150 Feet   Assistive device None   Gait Pattern Step-through pattern;Decreased stride length;Decreased step length - right;Decreased weight shift to left;Ataxic  intermittent ataxia   Ambulation Surface Level;Indoor   Gait velocity 2.59ft/sec  without AD   Standardized Balance Assessment   Standardized Balance Assessment Dynamic Gait Index   Dynamic Gait Index   Level Surface Normal   Change in Gait Speed Mild Impairment   Gait with Horizontal Head Turns Mild Impairment   Gait with Vertical Head Turns Mild Impairment   Gait and Pivot Turn Mild Impairment   Step Over Obstacle Moderate Impairment   Step Around Obstacles Mild Impairment   Steps Moderate Impairment   Total Score 15   LLE Tone   LLE Tone Modified Ashworth   LLE Tone   Modified Ashworth Scale for Grading Hypertonia LLE Slight increase in muscle tone, manifested by a catch and release or by minimal resistance at the end of the range of motion when the affected part(s) is moved in flexion or extension                           PT Education - 04/19/15 1601    Education provided Yes   Education Details PT provided pt with MS Society information. PT educated pt on the frequency/duration of PT. PT educated pt on the importance of pt taking medication as prescribed by MD (for depression), as pt reported MD prescribed medication for depression but he was not taking it but sleeping for most of the day and was not interested in doing anything. PT also encouraged pt to sleep 8-9/night and to take one nap a day, vs. sleeping for most of the night and day.   Person(s) Educated Patient;Other (comment)  girlfriend   Methods Explanation   Comprehension Verbalized understanding           PT Short Term Goals - 04/19/15 1613    PT SHORT TERM GOAL #1   Title Pt will in be IND in performing HEP to improve strength, balance, and flexibility. Target date: 05/30/14   Baseline No HEP   Status New   PT SHORT TERM GOAL #2   Title Pt will verbalize understanding of energy conservation techniques in order to reduce fatigue and improve safety during functional mobility. Target date: 05/30/14.   Baseline Pt unable to verbalize any energy conservation techniques.   Status New           PT Long Term Goals - 04/19/15  1615    PT LONG TERM GOAL #1   Title Pt will improve gait speed with LRAD to >/=2.35ft/sec to safely amb. in the community. Target date: 07/11/14   Baseline 2.80ft/sec no AD   Status New   PT LONG TERM GOAL #2   Title Pt will improve DGI score to >/=20/24 to decrease falls risk. Target date: 07/11/14   Baseline 15/24   Status New   PT LONG TERM GOAL #3   Title Pt will ambulate 1000' over even/uneven terrain with LRAD at MOD I level to improve functional mobility. Target date: 07/11/14   Baseline 150' over even terrain with supervision   Status New   PT LONG TERM GOAL #4   Title Pt will ambulate 300' indoors, over even terrain, IND to safely amb. at home. Target date: 07/11/14   Baseline 150' over even terrain with supervision   Status New   PT LONG TERM GOAL #5   Title Pt will ascend/descend 7 steps with one handrail in step through pattern at MOD I level  to safely traverse steps at home. Target date: 07/11/14   Baseline Ascended/descended 4 steps with one handrails in step-to pattern with min guard.   Status New               Plan - 04/19/15 1609    Clinical Impression Statement Pt is a pleasant 29y/o male presenting to OPPT neuro s/p hospitalization for MS exacerbation (Dx: 340/G35). Pt has history of relapsing-remitting MS. Pt presented with gait deviations, impaired balance, L LE spasticity, decreased endurance, and decreased strength. Pt's DGI score  indicates pt is at risk for falls. Pt's gait speed indicate pt is not able to safely ambulate in the community.   Pt will benefit from skilled therapeutic intervention in order to improve on the following deficits Abnormal gait;Decreased endurance;Decreased knowledge of use of DME;Decreased strength;Impaired tone;Decreased mobility;Decreased balance;Decreased coordination;Impaired flexibility   Rehab Potential Good   Clinical Impairments Affecting Rehab Potential Depression   PT Frequency --  12 visits over a 12 week period   PT Duration --  12 visits over a 12 week period   PT Treatment/Interventions ADLs/Self Care Home Management;Biofeedback;Microbiologist;Therapeutic exercise;Vestibular;Therapeutic activities;Manual techniques;Orthotic Fit/Training;Functional mobility training;Stair training;Gait training;Patient/family education;DME Instruction;Neuromuscular re-education   PT Next Visit Plan Initiate balance, strength, flexibility HEP   Consulted and Agree with Plan of Care Patient;Family member/caregiver   Family Member Consulted pt's girlfriend         Problem List Patient Active Problem List   Diagnosis Date Noted  . Multiple sclerosis exacerbation (HCC) 03/11/2015  . Tobacco abuse 03/06/2015  . Nausea without vomiting 11/08/2014  . 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 Relapsing remitting multiple sclerosis (HCC) 10/01/2014  . Swelling of both hands 05/14/2014  . Multiple sclerosis (HCC) 12/24/2013    [name] L 04/19/2015, 4:24 PM  [facility] [address] [place], [place], [address] Phone: [phone]   Fax:  [phone]  Name: Mike Lowery MRN: [id number] Date of Birth: 12/29/1985   Zerita Boers, PT,DPT 04/19/2015 4:24 PM Phone: [phone] Fax: [phone]

## 2015-04-19 NOTE — Telephone Encounter (Signed)
Spoke with representative from [place] and left a message for Sunny Schlein to return phone call to [place] at [facility] [phone].

## 2015-04-26 ENCOUNTER — Telehealth: Payer: Self-pay | Admitting: Neurology

## 2015-04-26 DIAGNOSIS — G35 Multiple sclerosis: Secondary | ICD-10-CM

## 2015-04-26 MED ORDER — GABAPENTIN 300 MG PO CAPS
300.0000 mg | ORAL_CAPSULE | Freq: Every day | ORAL | Status: DC
Start: 2015-04-26 — End: 2015-05-01

## 2015-04-26 NOTE — Telephone Encounter (Signed)
We can prescribe him gabapentin to help with the pain (  at bedtime).  Pain is likely a symptom of the MS as well as his depression.  I don't necessarily think he is having a flare up.  It is not healthy to keep receiving steroids all the time.  He just started the Tecfidera.  However, if he continues to be having so many symptoms, it may be best that he sees an MS specialist.

## 2015-04-26 NOTE — Telephone Encounter (Signed)
Medication sent in. Referral placed to Dr. Epimenio Foot

## 2015-04-26 NOTE — Telephone Encounter (Signed)
Mike Lowery/ [facility]/[phone]/ to inform that pt is having pn in his arms and chest/ possibly from med Tecsidepra

## 2015-04-26 NOTE — Telephone Encounter (Signed)
Contacted PCP to place referral due to insurance preference.

## 2015-04-26 NOTE — Telephone Encounter (Signed)
Patient is complaining of a "burning" sensation in both arms, and under his arms, and across his chest. Patient states he is itchy, but it causes extreme pain when he scratches. It has been on going for a week. Pt states he plans to report to E.D. Later today. Please advise.

## 2015-04-30 ENCOUNTER — Ambulatory Visit: Payer: Medicaid Other

## 2015-05-01 ENCOUNTER — Ambulatory Visit (INDEPENDENT_AMBULATORY_CARE_PROVIDER_SITE_OTHER): Payer: Medicaid Other | Admitting: Neurology

## 2015-05-01 ENCOUNTER — Encounter: Payer: Self-pay | Admitting: Neurology

## 2015-05-01 VITALS — BP 108/60 | HR 66 | Resp 14 | Ht 67.0 in | Wt 155.2 lb

## 2015-05-01 DIAGNOSIS — R5383 Other fatigue: Secondary | ICD-10-CM

## 2015-05-01 DIAGNOSIS — R4189 Other symptoms and signs involving cognitive functions and awareness: Secondary | ICD-10-CM

## 2015-05-01 DIAGNOSIS — F329 Major depressive disorder, single episode, unspecified: Secondary | ICD-10-CM | POA: Diagnosis not present

## 2015-05-01 DIAGNOSIS — F32A Depression, unspecified: Secondary | ICD-10-CM

## 2015-05-01 DIAGNOSIS — R200 Anesthesia of skin: Secondary | ICD-10-CM

## 2015-05-01 DIAGNOSIS — R269 Unspecified abnormalities of gait and mobility: Secondary | ICD-10-CM | POA: Diagnosis not present

## 2015-05-01 DIAGNOSIS — N529 Male erectile dysfunction, unspecified: Secondary | ICD-10-CM | POA: Insufficient documentation

## 2015-05-01 DIAGNOSIS — N521 Erectile dysfunction due to diseases classified elsewhere: Secondary | ICD-10-CM

## 2015-05-01 DIAGNOSIS — G35 Multiple sclerosis: Secondary | ICD-10-CM

## 2015-05-01 DIAGNOSIS — G35D Multiple sclerosis, unspecified: Secondary | ICD-10-CM

## 2015-05-01 MED ORDER — SILDENAFIL CITRATE 50 MG PO TABS
50.0000 mg | ORAL_TABLET | Freq: Every day | ORAL | Status: DC | PRN
Start: 2015-05-01 — End: 2015-07-02

## 2015-05-01 MED ORDER — DULOXETINE HCL 60 MG PO CPEP: 60.0000 mg | ORAL_CAPSULE | Freq: Every day | ORAL | Status: DC

## 2015-05-01 MED ORDER — GABAPENTIN 300 MG PO CAPS: 300.0000 mg | ORAL_CAPSULE | Freq: Three times a day (TID) | ORAL | Status: DC

## 2015-05-01 MED ORDER — AMPHETAMINE-DEXTROAMPHET ER 20 MG PO CP24: 20.0000 mg | ORAL_CAPSULE | Freq: Every day | ORAL | Status: DC

## 2015-05-01 NOTE — Progress Notes (Signed)
[facility]  PATIENT: Mike STROLLO DOB: Mar 13, 1986  REFERRING DOCTOR OR PCP:  Mike Lowery SOURCE: Patient, family, records in the EMR, MRI images on PACS  _________________________________   HISTORICAL  CHIEF COMPLAINT:  Chief Complaint  Patient presents with  . Multiple Sclerosis    Mike Lowery is here with his mother Mike Lowery, and aunt Mike Lowery, for eval of MS.  Sts. he was dx. in August of 2015.  Sts. presenting sx. were left sided numbness, numbness bilat hands and feet.  He was admitted and received 4-5 days of IV SoluMedrol. He saw Mike Lowery and was initially started on Plegridy.  Sts. he stopped this about 8 mos. later due to relapse--new lesions on mri.  He then started Tecfidera which he continues today.  Sts. he tolerates this well.  He sts. he is JCV ab positive. Sts. his last   . Extremity Weakness     Last mri in the fall at ? [facility].  Sts. sx. that bother him the most are numbnss from shoulders down, weakness in legs, left worse than right, fatigue./fim    HISTORY OF PRESENT ILLNESS:  I had the pleasure of seeing your patient, Mike Lowery, at [facility] for a neurologic consultation regarding his multiple sclerosis.   In mid 2015, he had the onset of weakness and numbness in the arms and legs, a little worse on the left.   When symptoms persisted, he presented to [facility] emergency room. MRI was consistent with multiple sclerosis and he was admitted. The MRI of the brain showed several plaques, mostly in the periventricular white matter. The MRI of the cervical spine showed several large T2 hyperintense foci, with some enhancement. He was given IV Solu-Medrol and his symptoms improved quite a bit. He followed up with Mike Lowery and was placed on Plegridy about 8 or 9 months after starting Plegridy, he had another exacerbation and MRI of the brain and cervical spine were performed again 02/21/2015. The MRI of the cervical spine does  not show any definite new lesions. However, the MRI of the brain does show several new lesions including for small enhancing foci. He was switched to Tecfidera and has only been on Tecfidera few days.  Gait/strength/sensation: He has a lot of difficulty with his gait. He feels his balance is off and he fatigues very easily. He is much less active. He notes weakness in the left more than right leg. He also has a lot of dysesthesias in the skin and deeper in the joints. The dysesthetic sensation worsened a few weeks ago. He notes an itching sensation at times.   He takes baclofen for the spasticity. He is on gabapentin but is not sure if the dose. It was just recently prescribed.  Bladder/bowel/ED: He denies any major change in bladder or bowel function. He has not had any incontinence.  Vision: He has been noted to have worsening visual function. He notes that the left eye seems a little weaker at night. He also wears Corrective lenses.  Fatigue/sleep:   He reports a lot of of fatigue. This is both physical and cognitive. He gets tired when he walks. He also has been noted to have more apathy. He falls asleep okay but will wake up and then have difficulty falling back asleep because of the uncomfortable sensations.  Mood/cognition: He does report some depression and was prescribed Cymbalta. He did not take the medication.   He denies much anxiety. He has not noted  major problems with cognition.  I personally reviewed the MRIs of the brain and cervical spine from October 2016. The MRI of the brain shows multiple T2/FLAIR hyperintense foci consistent with MS in the hemispheres and brainstem.   4 of the hemispheric small foci enhanced after gadolinium administration. The MRI of the cervical spine shows multiple hyperintense foci. There was a normal enhancement pattern.  REVIEW OF SYSTEMS: Constitutional: No fevers, chills, sweats, or change in appetite.   He has fatigue Eyes: No visual changes, double  vision, eye pain Ear, nose and throat: No hearing loss, ear pain, nasal congestion, sore throat Cardiovascular: No chest pain, palpitations Respiratory: No shortness of breath at rest or with exertion.   No wheezes GastrointestinaI: No nausea, vomiting, diarrhea, abdominal pain, fecal incontinence Genitourinary: No dysuria, urinary retention or frequency.  No nocturia. Musculoskeletal: No neck pain, back pain Integumentary: No rash, pruritus, skin lesions Neurological: as above Psychiatric: See above Endocrine: No palpitations, diaphoresis, change in appetite, change in weigh or increased thirst Hematologic/Lymphatic: No anemia, purpura, petechiae. Allergic/Immunologic: No itchy/runny eyes, nasal congestion, recent allergic reactions, rashes  ALLERGIES: No Known Allergies  HOME MEDICATIONS:  Current outpatient prescriptions:  .  baclofen (LIORESAL) 10 MG tablet, Take 1 tablet (10 mg total) by mouth 3 (three) times daily as needed for muscle spasms., Disp: 90 each, Rfl: 0 .  Dimethyl Fumarate (TECFIDERA) 240 MG CPDR, Take 240 mg by mouth., Disp: , Rfl:  .  Vitamin D, Ergocalciferol, (DRISDOL) 50000 UNITS CAPS capsule, Take 1 capsule (50,000 Units total) by mouth every 7 (seven) days. For 8 weeks, Disp: 10 capsule, Rfl: 0 .  acetaminophen-codeine (TYLENOL #3) 300-30 MG per tablet, Take 1 tablet by mouth every 4 (four) hours as needed. (Patient not taking: Reported on 05/01/2015), Disp: 30 tablet, Rfl: 0 .  DULoxetine (CYMBALTA) 30 MG capsule, Take 1 capsule (30 mg total) by mouth daily. (Patient not taking: Reported on 05/01/2015), Disp: 30 capsule, Rfl: 3 .  gabapentin (NEURONTIN) 300 MG capsule, Take 1 capsule (300 mg total) by mouth at bedtime. (Patient not taking: Reported on 05/01/2015), Disp: 30 capsule, Rfl: 1 .  methylPREDNISolone sodium succinate 1,000 mg in sodium chloride 0.9 % 50 mL, Inject 1,000 mg into the vein daily. (Patient not taking: Reported on 05/01/2015), Disp: 1  Dose, Rfl: 0 .  ondansetron (ZOFRAN) 4 MG tablet, Take 1 tablet (4 mg total) by mouth every 8 (eight) hours as needed for nausea or vomiting. (Patient not taking: Reported on 05/01/2015), Disp: 30 tablet, Rfl: 0 .  pantoprazole (PROTONIX) 40 MG tablet, Take 1 tablet (40 mg total) by mouth daily. (Patient not taking: Reported on 05/01/2015), Disp: 14 tablet, Rfl: 0 .  predniSONE (DELTASONE) 20 MG tablet, 3 Tabs PO daily for 5 days (days 1 - 5), then 2 tabs PO days (days 6 - 10), then 1 tab PO daily for 10 days (days 11 - 20), then Half Tab PO daily from day 21 (Patient not taking: Reported on 05/01/2015), Disp: 60 tablet, Rfl: 0  PAST MEDICAL HISTORY: Past Medical History  Diagnosis Date  . Multiple sclerosis (HCC) 12/24/13  . Multiple sclerosis (HCC)   . Vision abnormalities     PAST SURGICAL HISTORY: Past Surgical History  Procedure Laterality Date  . No past surgeries      FAMILY HISTORY: Family History  Problem Relation Age of Onset  . Cancer Maternal Grandmother     unknown   . Multiple sclerosis Neg Hx   . Healthy Mother   .  Healthy Father   . Ataxia Sister     SOCIAL HISTORY:  Social History   Social History  . Marital Status: Single    Spouse Name: N/A  . Number of Children: N/A  . Years of Education: N/A   Occupational History  . Not on file.   Social History Main Topics  . Smoking status: Former Smoker    Types: Cigarettes    Quit date: 04/18/2013  . Smokeless tobacco: Former Neurosurgeon    Quit date: 09/08/2014  . Alcohol Use: No     Comment: 8 oz liquor q other day social   . Drug Use: No     Comment: former  . Sexual Activity:    Partners: Female   Other Topics Concern  . Not on file   Social History Narrative     PHYSICAL EXAM  Filed Vitals:   05/01/15 1514  BP: 108/60  Pulse: 66  Resp: 14  Height: 5\' 7"  (1.702 m)  Weight: 155 lb 3.2 oz (70.398 kg)    Body mass index is 24.3 kg/(m^2).   General: The patient is well-developed and  well-nourished and in no acute distress  Eyes:  Funduscopic exam shows normal optic discs and retinal vessels.  Neck: The neck is supple, no carotid bruits are noted.  The neck is nontender.  Cardiovascular: The heart has a regular rate and rhythm with a normal S1 and S2. There were no murmurs, gallops or rubs. Lungs are clear to auscultation.  Skin: Extremities are without significant edema.  Musculoskeletal:  Back is nontender  Neurologic Exam  Mental status: The patient is alert and oriented x 3 at the time of the examination. The patient has apparent normal recent and remote memory, with an apparently normal attention span and concentration ability.   Speech is normal.  Cranial nerves: Extraocular movements are full. Pupils are equal, round, and reactive to light and accomodation.  Visual fields are full.  Facial symmetry is present. There is good facial sensation to soft touch bilaterally.Facial strength is normal.  Trapezius and sternocleidomastoid strength is normal. No dysarthria is noted.  The tongue is midline, and the patient has symmetric elevation of the soft palate. No obvious hearing deficits are noted.  Motor:  Muscle bulk is normal.   Tone is increased in legs. Strength is  5 / 5 in all 4 extremities.   Sensory: Sensory testing shows reduced touch in the right arm and left leg and reduced vibration sensation in the left arm.symmetric vibration in legs.  Coordination: Cerebellar testing reveals good finger-nose-finger and heel-to-shin bilaterally.  Gait and station: Station is normal.   Gait is wide and off balanced. Tandem gait is very poor and wide. Romberg is positive.   Reflexes: Deep tendon reflexes are symmetric and mildly brisk in arms but abnormal in legs with spread at the knees and non-sustained clonus at left ankle.   Plantar responses are flexor.    DIAGNOSTIC DATA (LABS, IMAGING, TESTING) - I reviewed patient records, labs, notes, testing and imaging myself  where available.  Lab Results  Component Value Date   WBC 4.9 03/27/2015   HGB 14.3 03/27/2015   HCT 43.9 03/27/2015   MCV 87.7 03/27/2015   PLT 319.0 03/27/2015      Component Value Date/Time   NA 139 03/27/2015 1216   K 3.6 03/27/2015 1216   CL 104 03/27/2015 1216   CO2 25 03/27/2015 1216   GLUCOSE 101* 03/27/2015 1216   BUN 12 03/27/2015 1216  CREATININE 0.94 03/27/2015 1216   CREATININE 0.92 03/27/2015 1130   CALCIUM 9.5 03/27/2015 1216   PROT 6.8 03/27/2015 1216   ALBUMIN 4.3 03/27/2015 1216   AST 18 03/27/2015 1216   ALT 33 03/27/2015 1216   ALKPHOS 63 03/27/2015 1216   BILITOT 0.3 03/27/2015 1216   GFRNONAA >60 03/23/2015 0020   GFRNONAA >89 10/10/2014 1251   GFRAA >60 03/23/2015 0020   GFRAA >89 10/10/2014 1251       ASSESSMENT AND PLAN  Multiple sclerosis (HCC) - Plan: gabapentin (NEURONTIN) 300 MG capsule  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   Mr. Reister is a 29 year old man with an aggressive form of MS who had significant breakthrough disease while on Plegridy.    He has just started Tecfidera and is concerned about safety issues off Tysabri. He needs to be on highly effective therapy. I would like to recheck an MRI of the brain in several months to make sure that the Tecfidera is preventing new lesions. If there are significant changes at that time or if he has a clinical exacerbation, I would highly suggest that he go on Tysabri, even if short-term to try to get the MS under better control. Alternatively, if ocrelizumab is approved, we could consider that treatment. 2.    Mr. Lindblad has significant physical and cognitive impairments. He is disabled and I do not let that he is able to be employed. He has started a disability process and I am in support of that. 3.   His  fatigue is severe. I will get him 2 days of IV Solu-Medrol 1000 mg and we will start Adderall XR.  I will renew the gabapentin for his dysesthesias. 4.    He appears to have a depression and I'll start duloxetine 60 mg 5.   He has ED related to his MS and Viagra will be prescribed. 6.   He will return to see me in 2-3 months or sooner if there are new or worsening neurologic symptoms.   Illyana Schorsch A. Epimenio Foot, MD, PhD 05/01/2015, 3:25 PM Certified in Neurology, Clinical Neurophysiology, Sleep Medicine, Pain Medicine and Neuroimaging  [facility] [address], [address] [place], [place] [address] [phone]

## 2015-05-03 ENCOUNTER — Ambulatory Visit: Payer: Medicaid Other

## 2015-05-03 DIAGNOSIS — R269 Unspecified abnormalities of gait and mobility: Secondary | ICD-10-CM

## 2015-05-03 DIAGNOSIS — R2681 Unsteadiness on feet: Secondary | ICD-10-CM

## 2015-05-03 DIAGNOSIS — R531 Weakness: Secondary | ICD-10-CM

## 2015-05-03 NOTE — Telephone Encounter (Signed)
Error

## 2015-05-03 NOTE — Therapy (Signed)
[facility] [address] [place], [place], [address] Phone: [phone]   Fax:  [phone]  Physical Therapy Treatment  Patient Details  Name: Mike Lowery MRN: [id number] Date of Birth: 09-27-1985 Referring Erskin Zinda: Dr. Everlena Cooper  Encounter Date: 05/03/2015      PT End of Session - 05/03/15 1610    Visit Number 2   Number of Visits 4  Medicaid only approved eval plus 3 treatments   Date for PT Re-Evaluation 07/18/15   Authorization Type Medicaid-eval plus 3 treats approved through 05/18/15   PT Start Time 1514  pt 25 minutes late as he thought appt was at 1530   PT Stop Time 1600   PT Time Calculation (min) 46 min   Equipment Utilized During Treatment Gait belt   Activity Tolerance Patient tolerated treatment well   Behavior During Therapy [facility] for tasks assessed/performed      Past Medical History  Diagnosis Date  . Multiple sclerosis (HCC) 12/24/13  . Multiple sclerosis (HCC)   . Vision abnormalities     Past Surgical History  Procedure Laterality Date  . No past surgeries      There were no vitals filed for this visit.  Visit Diagnosis:  Abnormality of gait  Weakness  Unsteadiness      Subjective Assessment - 05/03/15 1518    Subjective Pt reported he went to a new neurologist at [facility] (Dr. Epimenio Foot) and MD prescribed steroids, which has helped pt as he feels stronger.  Pt reports numbness chest travelling distal to feet.    Patient is accompained by: Family member  girlfriend and dtr   Pertinent History Depression   Patient Stated Goals Be able to run and try to get stronger   Currently in Pain? No/denies         Neuro re-ed: Pt performed at counter for balance activities with intermittent 1 UE support and min guard to supervision to ensure safety. Cues and demonstration for technique. Please see pt instructions for details.  Therex: Pt performed on mat with supervision to ensure safety.  Cues and demonstration for technique. Please see pt instructions for details. Pt also performed 5 reps of bridges on physioball but required assist to maintain LEs on ball, not added to HEP.                      PT Education - 05/03/15 1609    Education provided Yes   Education Details Strengthening and balance HEP.   Person(s) Educated Patient;Spouse   Methods Explanation;Demonstration;Tactile cues;Verbal cues;Handout   Comprehension Verbalized understanding;Returned demonstration          PT Short Term Goals - 05/03/15 1613    PT SHORT TERM GOAL #1   Title Pt will in be IND in performing HEP to improve strength, balance, and flexibility. Target date: 05/30/14   Baseline No HEP. ALL GOALS WILL BE CHECKED BY 05/18/15, AS MEDICAID ONLY APPROVED EVAL PLUS 3 TREATMENTS.   Status On-going   PT SHORT TERM GOAL #2   Title Pt will verbalize understanding of energy conservation techniques in order to reduce fatigue and improve safety during functional mobility. Target date: 05/30/14.   Baseline Pt unable to verbalize any energy conservation techniques.   Status On-going           PT Long Term Goals - 05/03/15 1613    PT LONG TERM GOAL #1   Title Pt will improve gait speed with LRAD to >/=2.69ft/sec  to safely amb. in the community. Target date: 07/11/14   Baseline 2.51ft/sec no AD.  ALL GOALS WILL BE CHECKED BY 05/18/15, AS MEDICAID ONLY APPROVED EVAL PLUS 3 TREATMENTS.   Status On-going   PT LONG TERM GOAL #2   Title Pt will improve DGI score to >/=20/24 to decrease falls risk. Target date: 07/11/14   Baseline 15/24   Status On-going   PT LONG TERM GOAL #3   Title Pt will ambulate 1000' over even/uneven terrain with LRAD at MOD I level to improve functional mobility. Target date: 07/11/14   Baseline 150' over even terrain with supervision   Status On-going   PT LONG TERM GOAL #4   Title Pt will ambulate 300' indoors, over even terrain, IND to safely amb. at home. Target  date: 07/11/14   Baseline 150' over even terrain with supervision   Status On-going   PT LONG TERM GOAL #5   Title Pt will ascend/descend 7 steps with one handrail in step through pattern at MOD I level  to safely traverse steps at home. Target date: 07/11/14   Baseline Ascended/descended 4 steps with one handrails in step-to pattern with min guard.   Status On-going               Plan - 05/03/15 1611    Clinical Impression Statement Pt tolerated HEP well, but did require tactile and VC's to improve lateral wt. shifting onto L LE. Pt only received 3 treatments authorization from Medicaid, so PT sessions will primarily focus on HEP. Pt would continue to benefit from skilled PT to improve safety during functional mobility.   Pt will benefit from skilled therapeutic intervention in order to improve on the following deficits Abnormal gait;Decreased endurance;Decreased knowledge of use of DME;Decreased strength;Impaired tone;Decreased mobility;Decreased balance;Decreased coordination;Impaired flexibility   Rehab Potential Good   Clinical Impairments Affecting Rehab Potential Depression   PT Frequency --  requested 1x/week for 12 weeks but Medicaid only approved eval plus 3 treatments   PT Duration Other (comment)  see above   PT Treatment/Interventions ADLs/Self Care Home Management;Biofeedback;Microbiologist;Therapeutic exercise;Vestibular;Therapeutic activities;Manual techniques;Orthotic Fit/Training;Functional mobility training;Stair training;Gait training;Patient/family education;DME Instruction;Neuromuscular re-education   PT Next Visit Plan flexibility HEP and balance training (L LE SLS activities)   PT Home Exercise Plan strengthening and balance HEP   Consulted and Agree with Plan of Care Patient;Family member/caregiver   Family Member Consulted pt's girlfriend        Problem List Patient Active Problem List   Diagnosis Date Noted  . Other fatigue  05/01/2015  . Numbness 05/01/2015  . Gait disturbance 05/01/2015  . Disturbed cognition 05/01/2015  . Erectile dysfunction 05/01/2015  . Multiple sclerosis exacerbation (HCC) 03/11/2015  . Tobacco abuse 03/06/2015  . Nausea without vomiting 11/08/2014  . 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 Relapsing remitting multiple sclerosis (HCC) 10/01/2014  . Swelling of both hands 05/14/2014  . Multiple sclerosis (HCC) 12/24/2013    Miller,Jennifer L 05/03/2015, 4:14 PM   [facility] [address] [place], [place], [address] Phone: [phone]   Fax:  [phone]  Name: Mike Lowery MRN: [id number] Date of Birth: 04/26/1986    Zerita Boers, PT,DPT 05/03/2015 4:15 PM Phone: [phone] Fax: [phone]

## 2015-05-03 NOTE — Patient Instructions (Signed)
Mini Squat: Double Leg    With feet shoulder width apart, reach forward for balance and do a mini squat. Keep knees in line with second toe. Knees do not go past toes. Repeat _10__ times per set. Rest _90__ seconds after set. Do __2_ sets per session. Perform every other day.   http://plyo.exer.us/70   Copyright  VHI. All rights reserved.   Perform at counter with hand on counter as needed:  Side-Stepping    Perform a mini-squat and then walk to left side with eyes open. Take even steps, leading with same foot. Make sure each foot lifts off the floor. Repeat in opposite direction. Repeat for __4__ times per session. Do __1__ sessions per day.   Copyright  VHI. All rights reserved.  Backward    Walk backwards with eyes open. Take even steps, making sure each foot lifts off floor. Repeat for __4__ times per session. Do _1___ sessions per day.   Copyright  VHI. All rights reserved.  Braiding    Move to side: 1) cross right leg in front of left, 2) bring back leg out to side, then 3) cross right leg behind left, 4) bring left leg out to side. Continue sequence in same direction. Reverse sequence, moving in opposite direction. Repeat sequence __4__ times per session. Do __1__ sessions per day.   Copyright  VHI. All rights reserved.    Bridge    Lie back, legs bent. Tuck in stomach muscles and press hips up towards the ceiling, hold for 2 seconds. Then lower hips down to mat.  Repeat __10__ times. Do __2__ sessions every other day.  http://pm.exer.us/54   Copyright  VHI. All rights reserved.   Prone Knee Flexion    Bend left knee, bringing heel toward buttocks. Hold __2__ seconds, then straighten. Repeat with right leg, with red band tied around right ankle. Repeat __10__ times. Perform 2 sets per session. Do __1__ sessions every other  day.  http://gt2.exer.us/306   Copyright  VHI. All rights reserved.   Abduction: Clam  - Side-Lying    Lie on side  with knees bent. Lift top knee, keeping feet together, and hips forward. You can perform on a couch, with back against back of couch to ensure that hips don't roll back. Keep trunk steady. Slowly lower leg. _10__ reps per set, _2__ sets per day, _3-4__ days per week.   http://ecce.exer.us/64   Copyright  VHI. All rights reserved.

## 2015-05-06 ENCOUNTER — Telehealth: Payer: Self-pay | Admitting: Neurology

## 2015-05-06 NOTE — Telephone Encounter (Signed)
Left message for [name] to call back.  Dr. Bonnita Hollow ov note sts. pt. is permanently and totally disabled.  I can give her a copy of the note.  Does she need a separate letter?/fim

## 2015-05-06 NOTE — Telephone Encounter (Signed)
Pt's mother called inquiring if letter is ready stating that pt cannot work.

## 2015-05-06 NOTE — Telephone Encounter (Signed)
I have spoken with Lason--he believes documentation w/i ov note that sts. he is disabled from all work will be sufficient.  He requested 2 copies of ov note--I have placed these up front [facility] for him to pick up/fim

## 2015-05-07 ENCOUNTER — Ambulatory Visit: Payer: Medicaid Other

## 2015-05-08 ENCOUNTER — Ambulatory Visit: Payer: Medicaid Other | Admitting: Physical Therapy

## 2015-05-08 ENCOUNTER — Encounter: Payer: Self-pay | Admitting: Physical Therapy

## 2015-05-08 DIAGNOSIS — R531 Weakness: Secondary | ICD-10-CM

## 2015-05-08 DIAGNOSIS — R269 Unspecified abnormalities of gait and mobility: Secondary | ICD-10-CM | POA: Diagnosis not present

## 2015-05-08 DIAGNOSIS — R2681 Unsteadiness on feet: Secondary | ICD-10-CM

## 2015-05-08 DIAGNOSIS — R252 Cramp and spasm: Secondary | ICD-10-CM

## 2015-05-08 NOTE — Patient Instructions (Signed)
Abductor Strength: Bridge Pose (Strap)    Bridge up (raise pelvis up) with green band around knees. While in up position, pull knees apart, stretching the band. Repeat for 10 reps.  To advance: 1. Add a hold time when knees are apart of 5-10 seconds. 2. Increase resistance by going to blue band.   Hip Abduction: Side-Lying (Single Leg)    Lie on side with knees bent, tubing around thighs just above knees. Raise top leg, keeping knee bent. With right leg on top: hold for 5 seconds to allow for a challenge. With left leg on top: add the holds when easy without holds using green band. 10 reps each side. 1-2 times a day To advance: 1 increase reps. 2. Increase to blue band.  http://tub.exer.us/43   Copyright  VHI. All rights reserved.  Gluteals (Prone)    Lie on stomach and lift entire leg toward ceiling with knee bent as shown. Do not tilt pelvis. Do not allow back to arch. To advance as ankle weights or resistance band around legs above knees. Hold __5__ seconds. Repeat __10__ times. Do __1-2__ sessions per day. CAUTION: Move slowly.  Copyright  VHI. All rights reserved.  Chair Sitting    Sit at edge of seat, spine straight, one leg extended. Put a hand on each thigh and bend forward from the hip, keeping spine straight. Allow hand on extended leg to reach toward toes. Support upper body with other arm. Hold _30__ seconds. Repeat __3_ times each leg. Do _1-2__ sessions per day.  .   Copyright  VHI. All rights reserved.  Piriformis Stretch, Sitting    Sit, one ankle on opposite knee, same-side hand on crossed knee. Push down on knee, keeping spine straight. Lean torso forward, with flat back, until tension is felt in hamstrings and gluteals of crossed-leg side. Hold _30__ seconds.  Repeat _3__ times each side. Do _1-2__ sessions per day.  Copyright  VHI. All rights reserved.  Feet Heel-Toe "Tandem"    At counter to use as needed for balance: Arms as needed for  balance, walk a straight line bringing one foot directly in front of the other and then walk a straight line backwards, one foot behind the other one. Repeat for 3 laps each way per session. Do __1-2__ sessions per day.  Copyright  VHI. All rights reserved.  Walking on Toes    At counter for balance: Walk on toes forward while continuing on a straight path and then backwards to the start point. 3 laps each way. Do _1-2___ sessions per day.  Copyright  VHI. All rights reserved.  Walking on Heels    At counter for balance as needed: Walk on heels forward while continuing on a straight path, and then backwards on heels. 3 laps each way. Do __1-2_ sessions per day.  Copyright  VHI. All rights reserved.

## 2015-05-09 ENCOUNTER — Ambulatory Visit: Payer: Medicaid Other

## 2015-05-10 NOTE — Therapy (Signed)
[facility] [address] [place], [place], [address] Phone: [phone]   Fax:  [phone]  Physical Therapy Treatment  Patient Details  Name: Mike Lowery MRN: [id number] Date of Birth: Jun 03, 1985 Referring Provider: Dr. Everlena Cooper  Encounter Date: 05/08/2015   05/08/15 1447  PT Visits / Re-Eval  Visit Number 3  Number of Visits 4 (Medicaid only approved eval plus 3 treatments)  Date for PT Re-Evaluation 07/18/15  Authorization  Authorization Type Medicaid-eval plus 3 treats approved through 05/18/15  PT Time Calculation  PT Start Time 1400  PT Stop Time 1448  PT Time Calculation (min) 48 min  PT - End of Session  Equipment Utilized During Treatment Gait belt  Activity Tolerance Patient tolerated treatment well  Behavior During Therapy [facility] for tasks assessed/performed     Past Medical History  Diagnosis Date  . Multiple sclerosis (HCC) 12/24/13  . Multiple sclerosis (HCC)   . Vision abnormalities     Past Surgical History  Procedure Laterality Date  . No past surgeries      There were no vitals filed for this visit.  Visit Diagnosis:  Abnormality of gait  Weakness  Unsteadiness  Spasticity     05/08/15 1408  Symptoms/Limitations  Subjective No new complaints. No changes in numbness. N fall or pain. Doing HEP without issues, some are now too easy.  Patient is accompained by: Family member (brother)  Pertinent History Depression  Patient Stated Goals Be able to run and try to get stronger  Pain Assessment  Currently in Pain? No/denies  Multiple Pain Sites No   Treatment: Exercises: Advance current ones that pt reported were too easy. Added new seated stretches and balance exercise at counter top. Refer to pt instructions for full details. Pt needed minimal cues on correct ex form and technique.         05/08/15 1443  PT Education  Education provided Yes  Education Details HEP:  advanced strengthening ex's and added new stretching/balance exercises  Person(s) Educated Patient;Spouse;Other (comment) (? brother)  Methods Explanation;Demonstration;Tactile cues;Verbal cues;Handout  Comprehension Verbalized understanding;Returned demonstration;Need further instruction         PT Short Term Goals - 05/03/15 1613    PT SHORT TERM GOAL #1   Title Pt will in be IND in performing HEP to improve strength, balance, and flexibility. Target date: 05/30/14   Baseline No HEP. ALL GOALS WILL BE CHECKED BY 05/18/15, AS MEDICAID ONLY APPROVED EVAL PLUS 3 TREATMENTS.   Status On-going   PT SHORT TERM GOAL #2   Title Pt will verbalize understanding of energy conservation techniques in order to reduce fatigue and improve safety during functional mobility. Target date: 05/30/14.   Baseline Pt unable to verbalize any energy conservation techniques.   Status On-going           PT Long Term Goals - 05/03/15 1613    PT LONG TERM GOAL #1   Title Pt will improve gait speed with LRAD to >/=2.12ft/sec to safely amb. in the community. Target date: 07/11/14   Baseline 2.35ft/sec no AD.  ALL GOALS WILL BE CHECKED BY 05/18/15, AS MEDICAID ONLY APPROVED EVAL PLUS 3 TREATMENTS.   Status On-going   PT LONG TERM GOAL #2   Title Pt will improve DGI score to >/=20/24 to decrease falls risk. Target date: 07/11/14   Baseline 15/24   Status On-going   PT LONG TERM GOAL #3   Title Pt will ambulate 1000' over even/uneven terrain with  LRAD at MOD I level to improve functional mobility. Target date: 07/11/14   Baseline 150' over even terrain with supervision   Status On-going   PT LONG TERM GOAL #4   Title Pt will ambulate 300' indoors, over even terrain, IND to safely amb. at home. Target date: 07/11/14   Baseline 150' over even terrain with supervision   Status On-going   PT LONG TERM GOAL #5   Title Pt will ascend/descend 7 steps with one handrail in step through pattern at MOD I level  to safely  traverse steps at home. Target date: 07/11/14   Baseline Ascended/descended 4 steps with one handrails in step-to pattern with min guard.   Status On-going        05/08/15 1531  Plan  Clinical Impression Statement Advanced and added to pt's current HEP to now include strengthening, stretching and balance components for a rounded fitness routine. Pt was able to perform them in clinic without any issues reported. Pt making steady progress toward goals.  Pt will benefit from skilled therapeutic intervention in order to improve on the following deficits Abnormal gait;Decreased endurance;Decreased knowledge of use of DME;Decreased strength;Impaired tone;Decreased mobility;Decreased balance;Decreased coordination;Impaired flexibility  Rehab Potential Good  Clinical Impairments Affecting Rehab Potential Depression  PT Frequency (requested 1x/week for 12 weeks but Medicaid only approved eval plus 3 treatments)  PT Duration Other (comment) (see above)  PT Treatment/Interventions ADLs/Self Care Home Management;Biofeedback;Microbiologist;Therapeutic exercise;Vestibular;Therapeutic activities;Manual techniques;Orthotic Fit/Training;Functional mobility training;Stair training;Gait training;Patient/family education;DME Instruction;Neuromuscular re-education  PT Next Visit Plan assess goals, address any questions pt has,  PT Home Exercise Plan strengthening and balance HEP  Consulted and Agree with Plan of Care Patient;Family member/caregiver  Family Member Consulted pt's brother, his girlfriend arrived for last 15 minutes of session    Problem List Patient Active Problem List   Diagnosis Date Noted  . Other fatigue 05/01/2015  . Numbness 05/01/2015  . Gait disturbance 05/01/2015  . Disturbed cognition 05/01/2015  . Erectile dysfunction 05/01/2015  . Multiple sclerosis exacerbation (HCC) 03/11/2015  . Tobacco abuse 03/06/2015  . Nausea without vomiting 11/08/2014  .  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 Relapsing remitting multiple sclerosis (HCC) 10/01/2014  . Swelling of both hands 05/14/2014  . Multiple sclerosis (HCC) 12/24/2013    Sallyanne Kuster 05/10/2015, 3:26 PM  Sallyanne Kuster, PTA, [facility] [address] [place], [place] [address] [phone] 05/10/2015, 3:26 PM   Name: Mike Lowery MRN: [id number] Date of Birth: 1985/08/13

## 2015-05-14 ENCOUNTER — Encounter (HOSPITAL_COMMUNITY): Payer: Self-pay

## 2015-05-14 ENCOUNTER — Emergency Department (HOSPITAL_COMMUNITY): Payer: Medicaid Other

## 2015-05-14 ENCOUNTER — Emergency Department (HOSPITAL_COMMUNITY)
Admission: EM | Admit: 2015-05-14 | Discharge: 2015-05-14 | Disposition: A | Discharge status: Home / Self Care | Payer: Medicaid Other | Attending: Emergency Medicine | Admitting: Emergency Medicine

## 2015-05-14 DIAGNOSIS — Y998 Other external cause status: Secondary | ICD-10-CM | POA: Insufficient documentation

## 2015-05-14 DIAGNOSIS — Y92410 Unspecified street and highway as the place of occurrence of the external cause: Secondary | ICD-10-CM | POA: Diagnosis not present

## 2015-05-14 DIAGNOSIS — Z79899 Other long term (current) drug therapy: Secondary | ICD-10-CM | POA: Insufficient documentation

## 2015-05-14 DIAGNOSIS — Z87891 Personal history of nicotine dependence: Secondary | ICD-10-CM | POA: Insufficient documentation

## 2015-05-14 DIAGNOSIS — Y9389 Activity, other specified: Secondary | ICD-10-CM | POA: Diagnosis not present

## 2015-05-14 DIAGNOSIS — S8991XA Unspecified injury of right lower leg, initial encounter: Secondary | ICD-10-CM | POA: Diagnosis present

## 2015-05-14 DIAGNOSIS — S8391XA Sprain of unspecified site of right knee, initial encounter: Secondary | ICD-10-CM | POA: Insufficient documentation

## 2015-05-14 MED ORDER — ACETAMINOPHEN 325 MG PO TABS
650.0000 mg | ORAL_TABLET | Freq: Once | ORAL | Status: AC
  Administered 2015-05-14: 650 mg via ORAL
  Filled 2015-05-14: qty 2

## 2015-05-14 MED ORDER — OXYCODONE-ACETAMINOPHEN 5-325 MG PO TABS
1.0000 | ORAL_TABLET | Freq: Once | ORAL | Status: AC
  Administered 2015-05-14: 1 via ORAL
  Filled 2015-05-14: qty 1

## 2015-05-14 MED ORDER — OXYCODONE-ACETAMINOPHEN 5-325 MG PO TABS: 2.0000 | ORAL_TABLET | ORAL | Status: DC | PRN

## 2015-05-14 NOTE — Discharge Instructions (Signed)
Knee Sprain Follow-up with your primary care physician. Rest. Ice. Compress (with Ace bandage). Elevate. Take Tylenol or ibuprofen for pain. A knee sprain is a tear in the strong bands of tissue that connect the bones (ligaments) of your knee. HOME CARE  Raise (elevate) your injured knee to lessen puffiness (swelling).  To ease pain and puffiness, put ice on the injured area.  Put ice in a plastic bag.  Place a towel between your skin and the bag.  Leave the ice on for 20 minutes, 2-3 times a day.  Only take medicine as told by your doctor.  Do not leave your knee unprotected until pain and stiffness go away (usually 4-6 weeks).  If you have a cast or splint, do not get it wet. If your doctor told you to not take it off, cover it with a plastic bag when you shower or bathe. Do not swim.  Your doctor may have you do exercises to prevent or limit permanent weakness and stiffness. GET HELP RIGHT AWAY IF:   Your cast or splint becomes damaged.  Your pain gets worse.  You have a lot of pain, puffiness, or numbness below the cast or splint. MAKE SURE YOU:   Understand these instructions.  Will watch your condition.  Will get help right away if you are not doing well or get worse.   This information is not intended to replace advice given to you by your health care provider. Make sure you discuss any questions you have with your health care provider.   Document Released: 04/22/2009 Document Revised: 05/09/2013 Document Reviewed: 01/10/2013 Elsevier Interactive Patient Education [employer]

## 2015-05-14 NOTE — ED Notes (Signed)
Patient transported to X-ray 
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
## 2015-05-15 ENCOUNTER — Ambulatory Visit: Payer: Medicaid Other | Admitting: Physical Therapy

## 2015-05-16 ENCOUNTER — Encounter (HOSPITAL_COMMUNITY): Payer: Self-pay | Admitting: Cardiology

## 2015-05-16 ENCOUNTER — Emergency Department (HOSPITAL_COMMUNITY)
Admission: EM | Admit: 2015-05-16 | Discharge: 2015-05-16 | Disposition: A | Discharge status: Home / Self Care | Payer: Medicaid Other | Attending: Emergency Medicine | Admitting: Emergency Medicine

## 2015-05-16 DIAGNOSIS — M25512 Pain in left shoulder: Secondary | ICD-10-CM | POA: Diagnosis not present

## 2015-05-16 DIAGNOSIS — M79605 Pain in left leg: Secondary | ICD-10-CM | POA: Insufficient documentation

## 2015-05-16 DIAGNOSIS — M7918 Myalgia, other site: Secondary | ICD-10-CM

## 2015-05-16 DIAGNOSIS — Z87891 Personal history of nicotine dependence: Secondary | ICD-10-CM | POA: Diagnosis not present

## 2015-05-16 DIAGNOSIS — M542 Cervicalgia: Secondary | ICD-10-CM | POA: Insufficient documentation

## 2015-05-16 DIAGNOSIS — G35 Multiple sclerosis: Secondary | ICD-10-CM | POA: Insufficient documentation

## 2015-05-16 DIAGNOSIS — Z79899 Other long term (current) drug therapy: Secondary | ICD-10-CM | POA: Insufficient documentation

## 2015-05-16 MED ORDER — DIAZEPAM 5 MG PO TABS
5.0000 mg | ORAL_TABLET | Freq: Once | ORAL | Status: AC
  Administered 2015-05-16: 5 mg via ORAL
  Filled 2015-05-16: qty 1

## 2015-05-16 MED ORDER — KETOROLAC TROMETHAMINE 60 MG/2ML IM SOLN
60.0000 mg | Freq: Once | INTRAMUSCULAR | Status: AC
  Administered 2015-05-16: 60 mg via INTRAMUSCULAR
  Filled 2015-05-16: qty 2
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
## 2015-05-16 NOTE — ED Notes (Signed)
Pt ambulatory to room with steady gait, NAD

## 2015-05-16 NOTE — ED Notes (Signed)
See PAs notes for assessment 
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
## 2015-05-16 NOTE — Discharge Instructions (Signed)
Is important to follow up with your primary care doctor for further evaluation and management of your symptoms. You may continue taking Motrin and Tylenol for your discomfort. Return to ED for any new or worsening symptoms.  Musculoskeletal Pain Musculoskeletal pain is muscle and boney aches and pains. These pains can occur in any part of the body. Your caregiver may treat you without knowing the cause of the pain. They may treat you if blood or urine tests, X-rays, and other tests were normal.  CAUSES There is often not a definite cause or reason for these pains. These pains may be caused by a type of germ (virus). The discomfort may also come from overuse. Overuse includes working out too hard when your body is not fit. Boney aches also come from weather changes. Bone is sensitive to atmospheric pressure changes. HOME CARE INSTRUCTIONS   Ask when your test results will be ready. Make sure you get your test results.  Only take over-the-counter or prescription medicines for pain, discomfort, or fever as directed by your caregiver. If you were given medications for your condition, do not drive, operate machinery or power tools, or sign legal documents for 24 hours. Do not drink alcohol. Do not take sleeping pills or other medications that may interfere with treatment.  Continue all activities unless the activities cause more pain. When the pain lessens, slowly resume normal activities. Gradually increase the intensity and duration of the activities or exercise.  During periods of severe pain, bed rest may be helpful. Lay or sit in any position that is comfortable.  Putting ice on the injured area.  Put ice in a bag.  Place a towel between your skin and the bag.  Leave the ice on for 15 to 20 minutes, 3 to 4 times a day.  Follow up with your caregiver for continued problems and no reason can be found for the pain. If the pain becomes worse or does not go away, it may be necessary to repeat  tests or do additional testing. Your caregiver may need to look further for a possible cause. SEEK IMMEDIATE MEDICAL CARE IF:  You have pain that is getting worse and is not relieved by medications.  You develop chest pain that is associated with shortness or breath, sweating, feeling sick to your stomach (nauseous), or throw up (vomit).  Your pain becomes localized to the abdomen.  You develop any new symptoms that seem different or that concern you. MAKE SURE YOU:   Understand these instructions.  Will watch your condition.  Will get help right away if you are not doing well or get worse.   This information is not intended to replace advice given to you by your health care provider. Make sure you discuss any questions you have with your health care provider.   Document Released: 05/04/2005 Document Revised: 07/27/2011 Document Reviewed: 01/06/2013 Elsevier Interactive Patient Education [employer]

## 2015-05-21 ENCOUNTER — Ambulatory Visit: Payer: Medicaid Other

## 2015-05-23 ENCOUNTER — Ambulatory Visit: Payer: Medicaid Other

## 2015-05-24 ENCOUNTER — Telehealth: Payer: Self-pay | Admitting: Neurology
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
## 2015-05-28 ENCOUNTER — Ambulatory Visit: Payer: Medicaid Other

## 2015-05-29 ENCOUNTER — Telehealth: Payer: Self-pay

## 2015-05-29 NOTE — Telephone Encounter (Signed)
Opened in error

## 2015-05-30 ENCOUNTER — Encounter: Payer: Self-pay | Admitting: Neurology

## 2015-05-30 ENCOUNTER — Ambulatory Visit: Payer: Medicaid Other

## 2015-05-30 ENCOUNTER — Telehealth: Payer: Self-pay | Admitting: Neurology

## 2015-05-30 NOTE — Telephone Encounter (Signed)
The patient said he needs his own personal letter about your disability. The best number to contact is [phone]

## 2015-05-30 NOTE — Telephone Encounter (Signed)
Pt called inquiring if he could get another packet that was given to him on last OV. He has given the 1st one to someone else. He said he is to have labs before next appt but I don't see an order.
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
## 2015-05-31 NOTE — Telephone Encounter (Signed)
Letter up front [employer]/fim 

## 2015-05-31 NOTE — Telephone Encounter (Signed)
Pt returned Faith's call. Message relay, pt will pick up letter at office today

## 2015-05-31 NOTE — Telephone Encounter (Signed)
LMTC.  [facility] has a letter regarding his disability ready.  Does he want to pick this up in the office, or would he like me to mail it to his home address?/fim

## 2015-06-04 ENCOUNTER — Ambulatory Visit: Payer: Medicaid Other

## 2015-06-06 ENCOUNTER — Ambulatory Visit: Payer: Medicaid Other
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
## 2015-06-11 ENCOUNTER — Ambulatory Visit: Payer: Medicaid Other

## 2015-06-13 ENCOUNTER — Ambulatory Visit: Payer: Medicaid Other

## 2015-06-20 ENCOUNTER — Ambulatory Visit: Payer: Medicaid Other | Admitting: Neurology

## 2015-07-02 ENCOUNTER — Ambulatory Visit (INDEPENDENT_AMBULATORY_CARE_PROVIDER_SITE_OTHER): Payer: Medicaid Other | Admitting: Neurology

## 2015-07-02 ENCOUNTER — Encounter: Payer: Self-pay | Admitting: Neurology

## 2015-07-02 VITALS — BP 126/56 | HR 68 | Resp 12 | Ht 67.0 in | Wt 155.0 lb

## 2015-07-02 DIAGNOSIS — F418 Other specified anxiety disorders: Secondary | ICD-10-CM | POA: Diagnosis not present

## 2015-07-02 DIAGNOSIS — R5383 Other fatigue: Secondary | ICD-10-CM | POA: Diagnosis not present

## 2015-07-02 DIAGNOSIS — N521 Erectile dysfunction due to diseases classified elsewhere: Secondary | ICD-10-CM | POA: Diagnosis not present

## 2015-07-02 DIAGNOSIS — R4189 Other symptoms and signs involving cognitive functions and awareness: Secondary | ICD-10-CM | POA: Diagnosis not present

## 2015-07-02 DIAGNOSIS — G35 Multiple sclerosis: Secondary | ICD-10-CM | POA: Diagnosis not present

## 2015-07-02 DIAGNOSIS — R269 Unspecified abnormalities of gait and mobility: Secondary | ICD-10-CM

## 2015-07-02 MED ORDER — BACLOFEN 10 MG PO TABS: ORAL_TABLET | ORAL | Status: DC

## 2015-07-02 MED ORDER — AMPHETAMINE-DEXTROAMPHET ER 20 MG PO CP24: 20.0000 mg | ORAL_CAPSULE | Freq: Every day | ORAL | Status: DC

## 2015-07-02 MED ORDER — GABAPENTIN 300 MG PO CAPS: ORAL_CAPSULE | ORAL | Status: DC

## 2015-07-02 NOTE — Progress Notes (Signed)
[facility]  PATIENT: Mike Lowery DOB: March 16, 1986  REFERRING DOCTOR OR PCP:  Dr. Hyman Hopes SOURCE: Patient, family, records in the EMR, MRI images on PACS  _________________________________   HISTORICAL  CHIEF COMPLAINT:  Chief Complaint  Patient presents with  . Multiple Sclerosis    Sts. he continues to tolerate Tecfidera well.  Today he c/o increased spasticity left leg over the last few days.  He also c/o intermittent dizziness over the last week, worse with position changes.  Orthostatic VS done today.  Lying 126/56, 68.  Sitting 110/68-76.  Standing at 0 min. 130/64, 80.  Standing at 3 min. 114/66, 74.  He also sts. fatigue is worse.  He is not certain he is taking Adderall./fim    HISTORY OF PRESENT ILLNESS:  Mike Lowery,is a 30 yo man with multiple sclerosis.   He switched to tecfidera about 3 months ago. He tolerates it well and she denies any GI issues or flushing.  He notes no new exacerbations.    Gait/strength/sensation: He feels his gait is better.   He is not needing a cane.  He feels his balance is off and his legs fatigues easily. He notes weakness and spasticity bilaterally, worse on his left.   He He notes painful dysesthesias in his legs and left > right arms.  Baclofen is helping the spasticity. Gabapentin helps the tingling pain a bit.     Bladder/bowel/ED: He denies any major change in bladder or bowel function. He has not had any incontinence.   He has ED.  Viagra was too expensive.    Vision: He has been noted blurry vision, worse on his right side.  When hew is tired, he notes mild diplopia.   No eye pain,   Fatigue/sleep:   He reports a lot of of physical and cognitive fatigue. He gets tired when he walks.   He did not fill the Adderall as he thought it was only for ADHD.  He falls asleep okay but still wakes a lot up and then have difficulty falling back asleep.  Mood/cognition: He does report some depression and has been prescribed  Cymbalta but is not sure if he is taking it.      He has apathy.    He denies much anxiety. He has not noted major problems with cognition.  MS HISTORY:  In mid 2015, he had the onset of weakness and numbness in the arms and legs, a little worse on the left.   When symptoms persisted, he presented to [facility] emergency room. MRI was consistent with multiple sclerosis and he was admitted. The MRI of the brain showed several plaques, mostly in the periventricular white matter. The MRI of the cervical spine showed several large T2 hyperintense foci, with some enhancement. He was given IV Solu-Medrol and his symptoms improved quite a bit. He followed up with Dr. Everlena Cooper and was placed on Plegridy about 8 or 9 months after starting Plegridy, he had another exacerbation and MRI of the brain and cervical spine were performed again 02/21/2015. The MRI of the cervical spine does not show any definite new lesions. However, the MRI of the brain does show several new lesions including for small enhancing foci. He was switched to [facility] November or December 2016.   I have personally reviewed the MRIs of the brain and cervical spine from October 2016. The MRI of the brain shows multiple T2/FLAIR hyperintense foci consistent with MS in the hemispheres and brainstem.  4 of the hemispheric small foci enhanced after gadolinium administration. The MRI of the cervical spine shows multiple hyperintense foci. There was a normal enhancement pattern.  REVIEW OF SYSTEMS: Constitutional: No fevers, chills, sweats, or change in appetite.   He has fatigue Eyes: No visual changes, double vision, eye pain Ear, nose and throat: No hearing loss, ear pain, nasal congestion, sore throat Cardiovascular: No chest pain, palpitations Respiratory: No shortness of breath at rest or with exertion.   No wheezes GastrointestinaI: No nausea, vomiting, diarrhea, abdominal pain, fecal incontinence Genitourinary: No dysuria, urinary retention  or frequency.  No nocturia. Musculoskeletal: No neck pain, back pain Integumentary: No rash, pruritus, skin lesions Neurological: as above Psychiatric: See above Endocrine: No palpitations, diaphoresis, change in appetite, change in weigh or increased thirst Hematologic/Lymphatic: No anemia, purpura, petechiae. Allergic/Immunologic: No itchy/runny eyes, nasal congestion, recent allergic reactions, rashes  ALLERGIES: No Known Allergies  HOME MEDICATIONS:  Current outpatient prescriptions:  .  baclofen (LIORESAL) 10 MG tablet, Take 1 tablet (10 mg total) by mouth 3 (three) times daily as needed for muscle spasms., Disp: 90 each, Rfl: 0 .  Dimethyl Fumarate (TECFIDERA) 240 MG CPDR, Take 240 mg by mouth., Disp: , Rfl:  .  DULoxetine (CYMBALTA) 60 MG capsule, Take 1 capsule (60 mg total) by mouth daily., Disp: 30 capsule, Rfl: 5 .  amphetamine-dextroamphetamine (ADDERALL XR) 20 MG 24 hr capsule, Take 1 capsule (20 mg total) by mouth daily., Disp: 30 capsule, Rfl: 0 .  gabapentin (NEURONTIN) 300 MG capsule, Take 1 capsule (300 mg total) by mouth 3 (three) times daily., Disp: 90 capsule, Rfl: 11 .  oxyCODONE-acetaminophen (PERCOCET/ROXICET) 5-325 MG tablet, Take 2 tablets by mouth every 4 (four) hours as needed for severe pain. (Patient not taking: Reported on 07/02/2015), Disp: 6 tablet, Rfl: 0  PAST MEDICAL HISTORY: Past Medical History  Diagnosis Date  . Multiple sclerosis (HCC) 12/24/13  . Multiple sclerosis (HCC)   . Vision abnormalities     PAST SURGICAL HISTORY: Past Surgical History  Procedure Laterality Date  . No past surgeries      FAMILY HISTORY: Family History  Problem Relation Age of Onset  . Cancer Maternal Grandmother     unknown   . Multiple sclerosis Neg Hx   . Healthy Mother   . Healthy Father   . Ataxia Sister     SOCIAL HISTORY:  Social History   Social History  . Marital Status: Single    Spouse Name: N/A  . Number of Children: N/A  . Years of  Education: N/A   Occupational History  . Not on file.   Social History Main Topics  . Smoking status: Former Smoker    Types: Cigarettes    Quit date: 04/18/2013  . Smokeless tobacco: Former Neurosurgeon    Quit date: 09/08/2014  . Alcohol Use: No     Comment: 8 oz liquor q other day social   . Drug Use: No     Comment: former  . Sexual Activity:    Partners: Female   Other Topics Concern  . Not on file   Social History Narrative     PHYSICAL EXAM  Filed Vitals:   07/02/15 1441  BP: 126/56  Pulse: 68  Resp: 12  Height:  (1.702 m)  Weight: 155 lb (70.308 kg)    Body mass index is 24.27 kg/(m^2).   General: The patient is well-developed and well-nourished and in no acute distress   Skin: Extremities are without significant  edema.  Musculoskeletal:  Back is nontender  Neurologic Exam  Mental status: The patient is alert and oriented x 3 at the time of the examination. The patient has apparent normal recent and remote memory, with an apparently normal attention span and concentration ability.   Speech is normal.  Cranial nerves: Extraocular movements are full. Pupils are equal, round, and reactive to light and accomodation.  Visual fields are full.  Facial symmetry is present. There is good facial sensation to soft touch bilaterally.Facial strength is normal.  Trapezius and sternocleidomastoid strength is normal. No dysarthria is noted.  The tongue is midline, and the patient has symmetric elevation of the soft palate. No obvious hearing deficits are noted.  Motor:  Muscle bulk is normal.   Tone is increased in legs. Strength is  5 / 5 in all 4 extremities.   Sensory: Sensory testing shows reduced touch in the right arm and left leg and reduced vibration sensation in the left arm and left leg.  Coordination: Cerebellar testing reveals good finger-nose-finger and heel-to-shin bilaterally.  Gait and station: Station is normal.   Gait is wide but improved balanced from  last visit. Tandem gait is wide. Romberg is positive.   Reflexes: Deep tendon reflexes are symmetric and mildly brisk in armseg.  In legs, they are increased, L>R.   He hasspread at the left knee and non-sustained clonus at left ankle.        DIAGNOSTIC DATA (LABS, IMAGING, TESTING) - I reviewed patient records, labs, notes, testing and imaging myself where available.  Lab Results  Component Value Date   WBC 4.9 03/27/2015   HGB 14.3 03/27/2015   HCT 43.9 03/27/2015   MCV 87.7 03/27/2015   PLT 319.0 03/27/2015      Component Value Date/Time   NA 139 03/27/2015 1216   K 3.6 03/27/2015 1216   CL 104 03/27/2015 1216   CO2 25 03/27/2015 1216   GLUCOSE 101* 03/27/2015 1216   BUN 12 03/27/2015 1216   CREATININE 0.94 03/27/2015 1216   CREATININE 0.92 03/27/2015 1130   CALCIUM 9.5 03/27/2015 1216   PROT 6.8 03/27/2015 1216   ALBUMIN 4.3 03/27/2015 1216   AST 18 03/27/2015 1216   ALT 33 03/27/2015 1216   ALKPHOS 63 03/27/2015 1216   BILITOT 0.3 03/27/2015 1216   GFRNONAA >60 03/23/2015 0020   GFRNONAA >89 10/10/2014 1251   GFRAA >60 03/23/2015 0020   GFRAA >89 10/10/2014 1251       ASSESSMENT AND PLAN  Relapsing remitting multiple sclerosis (HCC)  Other fatigue  Gait disturbance  Disturbed cognition  Erectile dysfunction due to diseases classified elsewhere  Depression with anxiety   1.   Continue Tecfidera He is tolerating it well. We will recheck an MRI of the brain in several months to make sure that the Tecfidera is preventing subclinical progression. If there are significant changes at that time or if he has a clinical exacerbation, I would highly suggest that he go on Tysabri, ocrelizumab or Zinbryta.    2.    Adderall XR.  3.    I will increase the gabapentin to 300-300-600 daily and baclofen to 10 - 10 - 20 mg for spasticity.   Higher nighttime dose may also help sleep.. 4.    Continue duloxetine 60 mg 5.    He will return to see me in 2-3 months or  sooner if there are new or worsening neurologic symptoms. I will want to check the MRI around the time  of his next visit.  Rashonda Warrior A. Epimenio Foot, MD, PhD 07/02/2015, 3:07 PM Certified in Neurology, Clinical Neurophysiology, Sleep Medicine, Pain Medicine and Neuroimaging  [facility] [address], [address] [place], [place] [address] [phone]

## 2015-07-02 NOTE — Patient Instructions (Signed)
We are going to do the following: 1.   Increase baclofen to 1 pill in the morning, 1 pill in the evening and 2 pills at night to help with spasticity 2.   Increase gabapentin to 1 pill in the morning, 1 pill in the evening and 2 pills at night to help with the nerve pain and painful tingling 3.   Take Adderall 1 pill every morning with fatigue and poor focus  I will see Mike Lowery back in 3 months and we will want to repeat an MRI around that time

## 2015-07-03 MED FILL — GABAPENTIN 300 MG CAPSULE: 300 | 30 days supply | Qty: 120 | Fill #0

## 2015-07-03 MED FILL — BACLOFEN 10 MG TABLET: 10 | 30 days supply | Qty: 120 | Fill #0 | Status: TO

## 2015-07-10 ENCOUNTER — Encounter (HOSPITAL_COMMUNITY): Payer: Self-pay | Admitting: Emergency Medicine

## 2015-07-10 ENCOUNTER — Emergency Department (HOSPITAL_COMMUNITY)
Admission: EM | Admit: 2015-07-10 | Discharge: 2015-07-10 | Disposition: A | Discharge status: Home / Self Care | Payer: Medicaid Other | Attending: Emergency Medicine | Admitting: Emergency Medicine

## 2015-07-10 DIAGNOSIS — Z87891 Personal history of nicotine dependence: Secondary | ICD-10-CM | POA: Insufficient documentation

## 2015-07-10 DIAGNOSIS — G35 Multiple sclerosis: Secondary | ICD-10-CM | POA: Insufficient documentation

## 2015-07-10 DIAGNOSIS — M79643 Pain in unspecified hand: Secondary | ICD-10-CM | POA: Insufficient documentation

## 2015-07-10 DIAGNOSIS — M62838 Other muscle spasm: Secondary | ICD-10-CM | POA: Diagnosis not present

## 2015-07-10 DIAGNOSIS — Z79899 Other long term (current) drug therapy: Secondary | ICD-10-CM | POA: Diagnosis not present

## 2015-07-10 DIAGNOSIS — R6884 Jaw pain: Secondary | ICD-10-CM | POA: Diagnosis present

## 2015-07-10 MED ORDER — OXYCODONE-ACETAMINOPHEN 5-325 MG PO TABS
ORAL_TABLET | ORAL | Status: AC
  Filled 2015-07-10: qty 1

## 2015-07-10 MED ORDER — OXYCODONE-ACETAMINOPHEN 5-325 MG PO TABS
1.0000 | ORAL_TABLET | Freq: Once | ORAL | Status: AC
  Administered 2015-07-10: 1 via ORAL

## 2015-07-10 NOTE — ED Provider Notes (Signed)
CSN: [id number]     Arrival date & time 07/10/15  1545 History   First MD Initiated Contact with Patient 07/10/15 2020     Chief Complaint  Patient presents with  . Jaw Pain  . Hand Pain     (Consider location/radiation/quality/duration/timing/severity/associated sxs/prior Treatment) Patient is a 30 y.o. male presenting with hand pain. The history is provided by the patient. No language interpreter was used.  Hand Pain This is a new problem. The current episode started 1 to 4 weeks ago. The problem occurs constantly. The problem has been waxing and waning. Pertinent negatives include no weakness. Nothing aggravates the symptoms. He has tried nothing for the symptoms. The treatment provided no relief.  Pt also has episodes of clinching his jaw.  Visitor reports this happens frequently.  Pt stars of and jaw clinches on left side and side of face turns up.  Pt had an episode while i was talking to him.  This lasted approximately 5 seconds  Past Medical History  Diagnosis Date  . Multiple sclerosis (HCC) 12/24/13  . Multiple sclerosis (HCC)   . Vision abnormalities    Past Surgical History  Procedure Laterality Date  . No past surgeries     Family History  Problem Relation Age of Onset  . Cancer Maternal Grandmother     unknown   . Multiple sclerosis Neg Hx   . Healthy Mother   . Healthy Father   . Ataxia Sister    Social History  Substance Use Topics  . Smoking status: Former Smoker    Types: Cigarettes    Quit date: 04/18/2013  . Smokeless tobacco: Former Neurosurgeon    Quit date: 09/08/2014  . Alcohol Use: No     Comment: 8 oz liquor q other day social     Review of Systems  Neurological: Negative for weakness.  All other systems reviewed and are negative.     Allergies  Review of patient's allergies indicates no known allergies.  Home Medications   Prior to Admission medications   Medication Sig Start Date End Date Taking? Authorizing Provider   amphetamine-dextroamphetamine (ADDERALL XR) 20 MG 24 hr capsule Take 1 capsule (20 mg total) by mouth daily. 07/02/15   Asa Lente, MD  baclofen (LIORESAL) 10 MG tablet Take one in the morning, one in the evening and two at bedtime for spasticity 07/02/15   Asa Lente, MD  Dimethyl Fumarate (TECFIDERA) 240 MG CPDR Take 240 mg by mouth.    Historical Provider, MD  DULoxetine (CYMBALTA) 60 MG capsule Take 1 capsule (60 mg total) by mouth daily. 05/01/15   Asa Lente, MD  gabapentin (NEURONTIN) 300 MG capsule Take one in the morning, one in the evening and two at night for nerve pain 07/02/15   Asa Lente, MD  oxyCODONE-acetaminophen (PERCOCET/ROXICET) 5-325 MG tablet Take 2 tablets by mouth every 4 (four) hours as needed for severe pain. Patient not taking: Reported on 07/02/2015 05/14/15   Hanna Patel-Mills, PA-C   BP 134/79 mmHg  Pulse 75  Temp(Src) 98.6 F (37 C) (Oral)  Resp 18  Ht 5\' 7"  (1.702 m)  Wt 71.215 kg  BMI 24.58 kg/m2  SpO2 100% Physical Exam  Constitutional: He is oriented to person, place, and time. He appears well-developed and well-nourished.  HENT:  Head: Normocephalic and atraumatic.  Right Ear: External ear normal.  Left Ear: External ear normal.  Mouth/Throat: Oropharynx is clear and moist.  Eyes: Conjunctivae and EOM are normal.  Pupils are equal, round, and reactive to light.  Neck: Normal range of motion.  Cardiovascular: Normal rate and normal heart sounds.   Pulmonary/Chest: Effort normal.  Abdominal: He exhibits no distension.  Musculoskeletal: Normal range of motion.  Neurological: He is alert and oriented to person, place, and time.  Skin: Skin is warm.  Psychiatric: He has a normal mood and affect.  Nursing note and vitals reviewed.   ED Course  Procedures (including critical care time) Labs Review Labs Reviewed - No data to display  Imaging Review No results found. I have personally reviewed and evaluated these images and lab  results as part of my medical decision-making.   EKG Interpretation None      MDM I am unsure if pt is having muscle spasm or if ths could be seizure type activity.  I spoke with Dr. Roseanne Reno he advised pt may need EEG.  He advised have pt call his neurologist tomorrow to discuss.  Dr. Roseanne Reno reports he would not add or change medications   Final diagnoses:  Muscle spasm    Call your neurologist tomorrow to be set up for EEG of your brain.   An After Visit Summary was printed and given to the patient.    Lonia Skinner [place], PA-C 07/10/15 2128  Derwood Kaplan, MD 07/11/15 [phone]

## 2015-07-10 NOTE — Discharge Instructions (Signed)
Multiple Sclerosis °Multiple sclerosis (MS) is a disease of the central nervous system. It leads to the loss of the insulating covering of the nerves (myelin sheath) of your brain. When this happens, brain signals do not get sent properly or may not get sent at all. The age of onset of MS varies.  °CAUSES °The cause of MS is unknown. However, it is more common in the [place] than in the southern [place]. °RISK FACTORS °There is a higher number of women with MS than men. MS is not an illness that is passed down to you from your family members (inherited). However, your risk of MS is higher if you have a relative with MS. °SIGNS AND SYMPTOMS  °The symptoms of MS occur in episodes or attacks. These attacks may last weeks to months. There may be long periods of almost no symptoms between attacks. The symptoms of MS vary. This is because of the many different ways it affects the central nervous system. The main symptoms of MS include: °· Vision problems and eye pain. °· Numbness. °· Weakness. °· Inability to move your arms, hands, feet, or legs (paralysis). °· Balance problems. °· Tremors. °DIAGNOSIS  °Your health care provider can diagnose MS with the help of imaging exams and lab tests. These may include specialized X-ray exams and spinal fluid tests. The best imaging exam to confirm a diagnosis of MS is an MRI. °TREATMENT  °There is no known cure for MS, but there are medicines that can decrease the number and frequency of attacks. Steroids are often used for short-term relief. Physical and occupational therapy may also help. There are also many new alternative or complementary treatments available to help control the symptoms of MS. Ask your health care provider if any of these other options are right for you. °HOME CARE INSTRUCTIONS  °· Take medicines as directed by your health care provider. °· Exercise as directed by your health care provider. °SEEK MEDICAL CARE IF: °You begin to feel  depressed. °SEEK IMMEDIATE MEDICAL CARE IF: °· You develop paralysis. °· You have problems with bladder, bowel, or sexual function. °· You develop mental changes, such as forgetfulness or mood swings. °· You have a period of uncontrolled movements (seizure). °  °This information is not intended to replace advice given to you by your health care provider. Make sure you discuss any questions you have with your health care provider. °  °Document Released: 05/01/2000 Document Revised: 05/09/2013 Document Reviewed: 01/09/2013 °Elsevier Interactive Patient Education ©2016 [employer] ° °

## 2015-07-10 NOTE — ED Notes (Signed)
Pt to ED from home for eval of pain to left side of jaw and hand pain, pt states hx of MS and feels like flare up. Pt denies any other symptoms at this time. nad noted.

## 2015-07-11 ENCOUNTER — Encounter: Payer: Self-pay | Admitting: Neurology

## 2015-07-11 ENCOUNTER — Telehealth: Payer: Self-pay | Admitting: Neurology

## 2015-07-11 ENCOUNTER — Ambulatory Visit (INDEPENDENT_AMBULATORY_CARE_PROVIDER_SITE_OTHER): Payer: Medicaid Other | Admitting: Neurology

## 2015-07-11 VITALS — BP 110/74 | HR 64 | Resp 12 | Ht 67.0 in | Wt 155.2 lb

## 2015-07-11 DIAGNOSIS — G35 Multiple sclerosis: Secondary | ICD-10-CM | POA: Diagnosis not present

## 2015-07-11 DIAGNOSIS — R293 Abnormal posture: Secondary | ICD-10-CM | POA: Diagnosis not present

## 2015-07-11 DIAGNOSIS — F418 Other specified anxiety disorders: Secondary | ICD-10-CM

## 2015-07-11 DIAGNOSIS — R200 Anesthesia of skin: Secondary | ICD-10-CM

## 2015-07-11 DIAGNOSIS — IMO0001 Reserved for inherently not codable concepts without codable children: Secondary | ICD-10-CM

## 2015-07-11 DIAGNOSIS — R269 Unspecified abnormalities of gait and mobility: Secondary | ICD-10-CM | POA: Diagnosis not present

## 2015-07-11 DIAGNOSIS — R6889 Other general symptoms and signs: Secondary | ICD-10-CM

## 2015-07-11 DIAGNOSIS — R4189 Other symptoms and signs involving cognitive functions and awareness: Secondary | ICD-10-CM | POA: Diagnosis not present

## 2015-07-11 DIAGNOSIS — R5383 Other fatigue: Secondary | ICD-10-CM | POA: Diagnosis not present

## 2015-07-11 DIAGNOSIS — N521 Erectile dysfunction due to diseases classified elsewhere: Secondary | ICD-10-CM | POA: Diagnosis not present

## 2015-07-11 MED ORDER — LEVETIRACETAM 500 MG PO TABS
ORAL_TABLET | ORAL | Status: DC
Start: 2015-07-11 — End: 2015-07-29

## 2015-07-11 NOTE — Progress Notes (Signed)
[facility]  PATIENT: Mike Lowery DOB: June 19, 1985  REFERRING DOCTOR OR PCP:  Dr. Hyman Hopes SOURCE: Patient, family, records in the EMR, MRI images on PACS  _________________________________   HISTORICAL  CHIEF COMPLAINT:  Chief Complaint  Patient presents with  . Multiple Sclerosis    Sts. he continues to tolerate Tecfidera well.  Sts. over the last couple of weeks,  his jaw is "locking up" right side only,  also ? spasms right arm at the same time.  Sts. unable to open his mouth or speak.  Episodes are intermittent and last about 10 sec. each.  Denies grinding his teeth at night.  Denies pain at tmj joints and denies jaw popping with movement./fim    HISTORY OF PRESENT ILLNESS:  Mike Lowery,is a 30 yo man with multiple sclerosis.   He switched to tecfidera about 3-4 months ago. He tolerates it well and she denies any GI issues or flushing.  He notes no new exacerbations.    He is noting a lot of spasms that come and go over the past couple weeks.   They occur on the right side and right face.    He gets spasticity lasting about 10-15 seconds.   The jaw and arm are involved at the same time. Leg is not involved.    Severity is intense and he can't open his mouth.   When phasic spasm not present, the left and right side seem similar.  He has not identified any trigger.   However, they have occurred every morning shortly after getting out of bed.    These spells are occurring 6-7 times daily.    There is no LOC.      Gait/strength/sensation: He feels his gait is mildly off balanced.   He is not needing a cane.  He has mild weakness and spasticity bilaterally, worse on his left.   He notes painful dysesthesias in his legs and left > right arms.  Baclofen is helping the tonic spasticity. Gabapentin helps the tingling pain a bit.     Mild depression, fatigue and insomnia are the same.  MS HISTORY:  In mid 2015, he had the onset of weakness and numbness in the arms and  legs, a little worse on the left.   When symptoms persisted, he presented to [facility] room. MRI was consistent with multiple sclerosis and he was admitted. The MRI of the brain showed several plaques, mostly in the periventricular white matter. The MRI of the cervical spine showed several large T2 hyperintense foci, with some enhancement. He was given IV Solu-Medrol and his symptoms improved quite a bit. He followed up with Dr. Everlena Cooper and was placed on Plegridy about 8 or 9 months after starting Plegridy, he had another exacerbation and MRI of the brain and cervical spine were performed again 02/21/2015. The MRI of the cervical spine does not show any definite new lesions. However, the MRI of the brain does show several new lesions including for small enhancing foci. He was switched to [employer] November or December 2016.   I have personally reviewed the MRIs of the brain and cervical spine from October 2016. The MRI of the brain shows multiple T2/FLAIR hyperintense foci consistent with MS in the hemispheres and brainstem.   4 of the hemispheric small foci enhanced after gadolinium administration. The MRI of the cervical spine shows multiple hyperintense foci. There was a normal enhancement pattern.  REVIEW OF SYSTEMS: Constitutional: No fevers, chills, sweats, or change  in appetite.   He has fatigue Eyes: No visual changes, double vision, eye pain Ear, nose and throat: No hearing loss, ear pain, nasal congestion, sore throat Cardiovascular: No chest pain, palpitations Respiratory: No shortness of breath at rest or with exertion.   No wheezes GastrointestinaI: No nausea, vomiting, diarrhea, abdominal pain, fecal incontinence Genitourinary: No dysuria, urinary retention or frequency.  No nocturia. Musculoskeletal: No neck pain, back pain Integumentary: No rash, pruritus, skin lesions Neurological: as above Psychiatric: See above Endocrine: No palpitations, diaphoresis, change in appetite,  change in weigh or increased thirst Hematologic/Lymphatic: No anemia, purpura, petechiae. Allergic/Immunologic: No itchy/runny eyes, nasal congestion, recent allergic reactions, rashes  ALLERGIES: No Known Allergies  HOME MEDICATIONS:  Current outpatient prescriptions:  .  amphetamine-dextroamphetamine (ADDERALL XR) 20 MG 24 hr capsule, Take 1 capsule (20 mg total) by mouth daily., Disp: 30 capsule, Rfl: 0 .  baclofen (LIORESAL) 10 MG tablet, Take one in the morning, one in the evening and two at bedtime for spasticity (Patient taking differently: Take 10-20 mg by mouth 3 (three) times daily. Take one in the morning, one in the evening and two at bedtime for spasticity), Disp: 120 each, Rfl: 11 .  Dimethyl Fumarate (TECFIDERA) 240 MG CPDR, Take 240 mg by mouth 2 (two) times daily. , Disp: , Rfl:  .  DULoxetine (CYMBALTA) 60 MG capsule, Take 1 capsule (60 mg total) by mouth daily., Disp: 30 capsule, Rfl: 5 .  gabapentin (NEURONTIN) 300 MG capsule, Take one in the morning, one in the evening and two at night for nerve pain (Patient taking differently: Take 300-600 mg by mouth 3 (three) times daily. Take one in the morning, one in the evening and two at night for nerve pain), Disp: 120 capsule, Rfl: 11  PAST MEDICAL HISTORY: Past Medical History  Diagnosis Date  . Multiple sclerosis (HCC) 12/24/13  . Multiple sclerosis (HCC)   . Vision abnormalities     PAST SURGICAL HISTORY: Past Surgical History  Procedure Laterality Date  . No past surgeries      FAMILY HISTORY: Family History  Problem Relation Age of Onset  . Cancer Maternal Grandmother     unknown   . Multiple sclerosis Neg Hx   . Healthy Mother   . Healthy Father   . Ataxia Sister     SOCIAL HISTORY:  Social History   Social History  . Marital Status: Single    Spouse Name: N/A  . Number of Children: N/A  . Years of Education: N/A   Occupational History  . Not on file.   Social History Main Topics  . Smoking  status: Former Smoker    Types: Cigarettes    Quit date: 04/18/2013  . Smokeless tobacco: Former Neurosurgeon    Quit date: 09/08/2014  . Alcohol Use: No     Comment: 8 oz liquor q other day social   . Drug Use: No     Comment: former  . Sexual Activity:    Partners: Female   Other Topics Concern  . Not on file   Social History Narrative     PHYSICAL EXAM  Filed Vitals:   07/11/15 1541  BP: 110/74  Pulse: 64  Resp: 12  Height: 5\' 7"  (1.702 m)  Weight: 155 lb 3.2 oz (70.398 kg)    Body mass index is 24.3 kg/(m^2).   General: The patient is well-developed and well-nourished and in no acute distress   Skin: Extremities are without significant edema.  Musculoskeletal:  Back  is nontender  Neurologic Exam  Mental status: The patient is alert and oriented x 3 at the time of the examination. The patient has apparent normal recent and remote memory, with an apparently normal attention span and concentration ability.   Speech is normal.  Cranial nerves: Extraocular movements are full. Pupils are equal, round, and reactive to light and accomodation.  Visual fields are full.  Facial symmetry is present. There is good facial sensation to soft touch bilaterally.Facial strength is normal.  Trapezius and sternocleidomastoid strength is normal. No dysarthria is noted.  The tongue is midline, and the patient has symmetric elevation of the soft palate. No obvious hearing deficits are noted.  Motor:   Muscle bulk is normal.   Tone is increased in legs. Strength is  5 / 5 in all 4 extremities.   Sensory: Sensory testing shows reduced touch in the right arm and left leg and reduced vibration sensation in the left arm and left leg.  Coordination: Cerebellar testing reveals good finger-nose-finger and heel-to-shin bilaterally.  Gait and station: Station is normal.   Gait is wide but improved balanced from last visit. Tandem gait is wide. Romberg is positive.   Reflexes: Deep tendon reflexes are  symmetric and mildly brisk in armseg.  In legs, they are increased, L>R.   He hasspread at the left knee and non-sustained clonus at left ankle.        DIAGNOSTIC DATA (LABS, IMAGING, TESTING) - I reviewed patient records, labs, notes, testing and imaging myself where available.  Lab Results  Component Value Date   WBC 4.9 03/27/2015   HGB 14.3 03/27/2015   HCT 43.9 03/27/2015   MCV 87.7 03/27/2015   PLT 319.0 03/27/2015      Component Value Date/Time   NA 139 03/27/2015 1216   K 3.6 03/27/2015 1216   CL 104 03/27/2015 1216   CO2 25 03/27/2015 1216   GLUCOSE 101* 03/27/2015 1216   BUN 12 03/27/2015 1216   CREATININE 0.94 03/27/2015 1216   CREATININE 0.92 03/27/2015 1130   CALCIUM 9.5 03/27/2015 1216   PROT 6.8 03/27/2015 1216   ALBUMIN 4.3 03/27/2015 1216   AST 18 03/27/2015 1216   ALT 33 03/27/2015 1216   ALKPHOS 63 03/27/2015 1216   BILITOT 0.3 03/27/2015 1216   GFRNONAA >60 03/23/2015 0020   GFRNONAA >89 10/10/2014 1251   GFRAA >60 03/23/2015 0020   GFRAA >89 10/10/2014 1251       ASSESSMENT AND PLAN  Relapsing remitting multiple sclerosis (HCC)  Depression with anxiety  Erectile dysfunction due to diseases classified elsewhere  Disturbed cognition  Gait disturbance  Numbness  Other fatigue  Spells (HCC) - Plan: EEG adult  Posturing episode - Plan: EEG adult    1.   Continue Tecfidera for MS 2.    His spells likely represent phasic spasms from his MS. However, as he is mute during the episodes we need to also rule out the possibility of seizures. I will check an EEG. I will also add Keppra which can help for both phasic spasms and seizures.  3.  He will return to see me in 2-3 months or sooner if there are new or worsening neurologic symptoms. I will want to check the MRI around the time of his next visit.  Richard A. Epimenio Foot, MD, PhD 07/11/2015, 4:01 PM Certified in Neurology, Clinical Neurophysiology, Sleep Medicine, Pain Medicine and  Neuroimaging  [facility] [address], [address] [place], [place] [address] [phone]

## 2015-07-11 NOTE — Telephone Encounter (Signed)
LMTC.  I have reserved an appt. for him today at 3:20, arrival time of 3:10.  Can discuss further at appt./fim

## 2015-07-11 NOTE — Telephone Encounter (Signed)
Girlfriend Minus Breeding [phone] called to request appointment today, states patient was seen at [facility] last night and was advised to see Neurologist, they advised he needs EEG, due to worsening symptoms.

## 2015-07-15 MED FILL — levETIRAcetam 500 MG TABS: 500 | 30 days supply | Qty: 90 | Fill #0 | Status: TO

## 2015-07-20 ENCOUNTER — Emergency Department (HOSPITAL_COMMUNITY)
Admission: EM | Admit: 2015-07-20 | Discharge: 2015-07-20 | Disposition: A | Discharge status: Home / Self Care | Payer: Medicaid Other | Attending: Emergency Medicine | Admitting: Emergency Medicine

## 2015-07-20 ENCOUNTER — Encounter (HOSPITAL_COMMUNITY): Payer: Self-pay | Admitting: Family Medicine

## 2015-07-20 DIAGNOSIS — Z87891 Personal history of nicotine dependence: Secondary | ICD-10-CM | POA: Diagnosis not present

## 2015-07-20 DIAGNOSIS — G35 Multiple sclerosis: Secondary | ICD-10-CM | POA: Diagnosis not present

## 2015-07-20 DIAGNOSIS — Z79899 Other long term (current) drug therapy: Secondary | ICD-10-CM | POA: Diagnosis not present

## 2015-07-20 DIAGNOSIS — M79605 Pain in left leg: Secondary | ICD-10-CM | POA: Diagnosis present

## 2015-07-20 DIAGNOSIS — R569 Unspecified convulsions: Secondary | ICD-10-CM | POA: Diagnosis not present

## 2015-07-20 LAB — CBC WITH DIFFERENTIAL/PLATELET
BASOS ABS: 0.1 10*3/uL (ref 0.0–0.1)
Basophils Relative: 2 %
EOS PCT: 2 %
Eosinophils Absolute: 0.1 10*3/uL (ref 0.0–0.7)
HEMATOCRIT: 42.5 % (ref 39.0–52.0)
HEMOGLOBIN: 14.2 g/dL (ref 13.0–17.0)
LYMPHS PCT: 56 %
Lymphs Abs: 2 10*3/uL (ref 0.7–4.0)
MCH: 28.8 pg (ref 26.0–34.0)
MCHC: 33.4 g/dL (ref 30.0–36.0)
MCV: 86.2 fL (ref 78.0–100.0)
Monocytes Absolute: 0.3 10*3/uL (ref 0.1–1.0)
Monocytes Relative: 9 %
NEUTROS ABS: 1.1 10*3/uL — AB (ref 1.7–7.7)
NEUTROS PCT: 31 %
PLATELETS: 250 10*3/uL (ref 150–400)
RBC: 4.93 MIL/uL (ref 4.22–5.81)
RDW: 12.4 % (ref 11.5–15.5)
WBC: 3.4 10*3/uL — AB (ref 4.0–10.5)

## 2015-07-20 LAB — BASIC METABOLIC PANEL
ANION GAP: 8 (ref 5–15)
BUN: 11 mg/dL (ref 6–20)
CO2: 28 mmol/L (ref 22–32)
Calcium: 9.1 mg/dL (ref 8.9–10.3)
Chloride: 106 mmol/L (ref 101–111)
Creatinine, Ser: 0.96 mg/dL (ref 0.61–1.24)
Glucose, Bld: 92 mg/dL (ref 65–99)
POTASSIUM: 4.1 mmol/L (ref 3.5–5.1)
SODIUM: 142 mmol/L (ref 135–145)

## 2015-07-20 MED ORDER — DIMETHYL FUMARATE 240 MG PO CPDR: 240.0000 mg | DELAYED_RELEASE_CAPSULE | Freq: Two times a day (BID) | ORAL | Status: DC

## 2015-07-20 NOTE — Discharge Instructions (Signed)
Follow-up for your EEG testing on Monday as scheduled.  See your urologist, as needed, and as planned, for the seizures and multiple sclerosis.

## 2015-07-20 NOTE — ED Provider Notes (Addendum)
CSN: [id number]     Arrival date & time 07/20/15  2707 History   First MD Initiated Contact with Patient 07/20/15 0913     Chief Complaint  Patient presents with  . Leg Pain     (Consider location/radiation/quality/duration/timing/severity/associated sxs/prior Treatment) HPI   Mike Lowery is a 30 y.o. male who presents for assistance with his medications. He ran out of his Dimethyl Fumarate (TECFIDERA) 240 MG, yesterday, because he was unable to receive the shipment earlier this week and was sent back to the warehouse. He has apparently requested that it be resent to him for next week, but he needs his medication before then. He has ongoing episodes of left leg pain which he states is from his multiple sclerosis. He is also having episodes of twitching of his face and right arm, which has been treated recently with Keppra, which he is taking. He was evaluated by his neurologist on 07/11/2015, after his ED visit the day before. He denies fever, chills, nausea, vomiting, weakness or dizziness. He is not had any falls or injuries associated with the seizure activity. There are no other no modifying factors.   Past Medical History  Diagnosis Date  . Multiple sclerosis (HCC) 12/24/13  . Multiple sclerosis (HCC)   . Vision abnormalities    Past Surgical History  Procedure Laterality Date  . No past surgeries     Family History  Problem Relation Age of Onset  . Cancer Maternal Grandmother     unknown   . Multiple sclerosis Neg Hx   . Healthy Mother   . Healthy Father   . Ataxia Sister    Social History  Substance Use Topics  . Smoking status: Former Smoker    Types: Cigarettes    Quit date: 04/18/2013  . Smokeless tobacco: Former Neurosurgeon    Quit date: 09/08/2014  . Alcohol Use: No     Comment: 8 oz liquor q other day social     Review of Systems  All other systems reviewed and are negative.     Allergies  Review of patient's allergies indicates no known allergies.  Home  Medications   Prior to Admission medications   Medication Sig Start Date End Date Taking? Authorizing Provider  amphetamine-dextroamphetamine (ADDERALL XR) 20 MG 24 hr capsule Take 1 capsule (20 mg total) by mouth daily. 07/02/15   Mike Lente, MD  baclofen (LIORESAL) 10 MG tablet Take one in the morning, one in the evening and two at bedtime for spasticity Patient taking differently: Take 10-20 mg by mouth 3 (three) times daily. Take one in the morning, one in the evening and two at bedtime for spasticity 07/02/15   Mike Lente, MD  Dimethyl Fumarate (TECFIDERA) 240 MG CPDR Take 240 mg by mouth 2 (two) times daily.     Historical Provider, MD  DULoxetine (CYMBALTA) 60 MG capsule Take 1 capsule (60 mg total) by mouth daily. 05/01/15   Mike Lente, MD  gabapentin (NEURONTIN) 300 MG capsule Take one in the morning, one in the evening and two at night for nerve pain Patient taking differently: Take 300-600 mg by mouth 3 (three) times daily. Take one in the morning, one in the evening and two at night for nerve pain 07/02/15   Mike Lente, MD  levETIRAcetam (KEPPRA) 500 MG tablet One pill in am and two pills at night po 07/11/15   Mike Lente, MD   BP 122/74 mmHg  Pulse 66  Temp(Src)  97.7 F (36.5 C) (Oral)  Resp 20  SpO2 99% Physical Exam  Constitutional: He is oriented to person, place, and time. He appears well-developed and well-nourished. No distress.  HENT:  Head: Normocephalic and atraumatic.  Right Ear: External ear normal.  Left Ear: External ear normal.  Eyes: Conjunctivae and EOM are normal. Pupils are equal, round, and reactive to light.  Neck: Normal range of motion and phonation normal. Neck supple.  Cardiovascular: Normal rate, regular rhythm and normal heart sounds.   Pulmonary/Chest: Effort normal and breath sounds normal. He exhibits no bony tenderness.  Abdominal: Soft. There is no tenderness.  Musculoskeletal: Normal range of motion.  Normal gait   Neurological: He is alert and oriented to person, place, and time. No cranial nerve deficit or sensory deficit. He exhibits normal muscle tone. Coordination normal.  Skin: Skin is warm, dry and intact.  Psychiatric: He has a normal mood and affect. His behavior is normal. Judgment and thought content normal.  Nursing note and vitals reviewed.   ED Course  Procedures (including critical care time)  Medications  Dimethyl Fumarate CPDR 240 mg (not administered)    Patient Vitals for the past 24 hrs:  BP Temp Temp src Pulse Resp SpO2  07/20/15 0841 122/74 mmHg 97.7 F (36.5 C) Oral 66 20 99 %   Consultation Case Management for assistance with medications- 0934- Seen in ED by Case Manager.She was unable to locate any of his medication.  12:25 AM Reevaluation with update and discussion. After initial assessment and treatment, an updated evaluation reveals No change in clinical status. No seizures evident in the emergency department. We have given the best effort for finding medicine for him for his multiple sclerosis. This medication is unavailable in this hospital, or any local pharmacy. The patient is due to have a shipment, to his home, in 4 days time. There is no indication for further intervention for MS at this time. He states he is due to have an EEG done on 07/22/15 to be evaluated for seizures. Is currently taking Keppra. Findings discussed with the patient and all questions were answered. Mike Lowery L    Labs Review Labs Reviewed  BASIC METABOLIC PANEL  CBC WITH DIFFERENTIAL/PLATELET    Imaging Review No results found. I have personally reviewed and evaluated these images and lab results as part of my medical decision-making.   EKG Interpretation None      MDM   Final diagnoses:  Multiple sclerosis (HCC)    Multiple sclerosis, currently without his medication but no significant acute limitation is noted. Stated seizures without evidence for Tonic-clonic seizure for  other central neurologic system abnormality. Doubt serious bacterial infection, metabolic instability or impending vascular collapse.  Nursing Notes Reviewed/ Care Coordinated Applicable Imaging Reviewed Interpretation of Laboratory Data incorporated into ED treatment  The patient appears reasonably screened and/or stabilized for discharge and I doubt any other medical condition or other [facility] requiring further screening, evaluation, or treatment in the ED at this time prior to discharge.  Plan: Home Medications- usual; Home Treatments- rest; return here if the recommended treatment, does not improve the symptoms; Recommended follow up- PCP prn    Mancel Bale, MD 07/20/15 1229  Mancel Bale, MD 07/20/15 1231

## 2015-07-20 NOTE — ED Notes (Signed)
Pt here for MS flare up and needs pain meds. sts also he has been having mild seizures. sts he has been taking seizure meds.

## 2015-07-20 NOTE — ED Notes (Signed)
PT ran out of MS meds yesterday. PT reports he has been having partial seizures recently and has been taking medication for them. PT reports his seizure activity effects his right arm and face. PT reports he is scheduled for an EEG Monday.

## 2015-07-20 NOTE — Care Management Note (Signed)
Case Management Note  Patient Details  Name: Mike Lowery MRN: [id number] Date of Birth: 08-27-1985  Subjective/Objective: 30 y.o. M seen in the ED, reports his  MS medication, which is filled by Mail order, will not arrive until Tuesday 07/23/2015. Pt needs enough medication  (#12 tablets/capsules) to get him through until Mike Lowery medication arrives. Unable to [facility] this pt as he has MEDICAID. Spoke with the Pharmacist here on [facility], Shanda Bumps who suggested CM call retail pharmacy where he usually fills other Rx's  Such as Antibiotics...Marland KitchenMarland KitchenCM asked pt to have someone at home send him a copy or text him a copy of Mike Lowery MEDICAID card and CM will call Local [facility] for assistance.                   Action/Plan: Will continue to follow.    Expected Discharge Date:                  Expected Discharge Plan:     In-House Referral:     Discharge planning Services  CM Consult, Medication Assistance  Post Acute Care Choice:    Choice offered to:  Patient  DME Arranged:    DME Agency:     HH Arranged:    HH Agency:     Status of Service:  In process, will continue to follow  Medicare Important Message Given:    Date Medicare IM Given:    Medicare IM give by:    Date Additional Medicare IM Given:    Additional Medicare Important Message give by:     If discussed at Long Length of Stay Meetings, dates discussed:    Additional Comments:  Yvone Neu, RN 07/20/2015, 10:12 AM

## 2015-07-20 NOTE — Progress Notes (Signed)
Spoke with Shanda Bumps in Pharmacy who is unable to provide medication until it can be ordered. This will be Monday. CM called local retail pharmacies at the direction of Pharmacy staff to include [facility], [facility], [facility], [facility], [facility], [facility] and [facility]. None of these Pharmacies have this medication in stock as it is on National back order. Updated both the EDP and pt. Instructed pt that he should keep pills in a pill minder, and when he gets down to 10 days he should call mail order pharmacy to ascertain delivery date. Verbalized understanding although I am unsure of cognitive abilities. No further CM needs at this time.

## 2015-07-20 NOTE — ED Notes (Signed)
Pharmacy reports that med is not available and will only be available via mail order. Dr. Effie Shy made aware

## 2015-07-20 NOTE — ED Notes (Signed)
Case management at bedside.

## 2015-07-22 ENCOUNTER — Ambulatory Visit (INDEPENDENT_AMBULATORY_CARE_PROVIDER_SITE_OTHER): Payer: Medicaid Other | Admitting: Neurology

## 2015-07-22 DIAGNOSIS — IMO0001 Reserved for inherently not codable concepts without codable children: Secondary | ICD-10-CM

## 2015-07-22 DIAGNOSIS — R259 Unspecified abnormal involuntary movements: Secondary | ICD-10-CM | POA: Diagnosis not present

## 2015-07-22 DIAGNOSIS — R293 Abnormal posture: Secondary | ICD-10-CM

## 2015-07-22 DIAGNOSIS — R6889 Other general symptoms and signs: Principal | ICD-10-CM

## 2015-07-22 NOTE — Progress Notes (Signed)
   [facility]  EEG (ELECTROENCEPHALOGRAM) REPORT   STUDY DATE: 07/22/2015 PATIENT NAME: Mike Lowery DOB: 15-Sep-1985 MRN: [id number]  ORDERING CLINICIAN: Loman Logan A. Epimenio Foot, MD. PhD  TECHNOLOGIST: Gearldine Shown TECHNIQUE: Electroencephalogram was recorded utilizing standard 10-20 system of lead placement and reformatted into average and bipolar montages.  RECORDING TIME: 27.6 minutes ACTIVATION: Photic stimulation and hyperventilation  CLINICAL INFORMATION: 30 year old man with multiple sclerosis who has had spells of posturing of the right arm 10-30 seconds  FINDINGS:   There was a background rhythm of 10 Hz that reacted to eye opening and closing.   No slowing was noted.   There were several phase reversals at P4 (right parietal) but did not have a field. These most likely represent artifact and not sharp waves.   He had a normal photic driving response. Significant change in the rhythms with hyperventilation or recovery. Became drowsy and entered stage II sleep with vertex waves and spindles noted.    There was no epileptiform activity.  IMPRESSION: This is a normal study with the patient was awake and asleep. There was no epileptiform activity.   INTERPRETING PHYSICIAN:   Sequita Wise A. Epimenio Foot, MD, PhD Certified in Neurology, Clinical Neurophysiology, Sleep Medicine, Pain Medicine and Neuroimaging  [facility] [address], [address] [place], [place] [address] [phone]

## 2015-07-29 ENCOUNTER — Telehealth: Payer: Self-pay | Admitting: Neurology

## 2015-07-29 MED ORDER — LEVETIRACETAM 1000 MG PO TABS: ORAL_TABLET | ORAL | Status: DC

## 2015-07-29 NOTE — Telephone Encounter (Signed)
Per RAS, EEG is ok.  Pt. should increase Keppra to 1,000mg  bid.  I have spoken with gf Keyanda and advised her of this.  She is agreeable.  New rx. escribed to [facility] [address] per her request/fim

## 2015-07-29 NOTE — Telephone Encounter (Signed)
Pt's girlfriend called requesting EEG results.  I talked with the pt also and he sts he is still having seizures throughout the day.

## 2015-08-14 NOTE — Telephone Encounter (Signed)
Error

## 2015-09-10 ENCOUNTER — Telehealth: Payer: Self-pay | Admitting: Neurology

## 2015-09-10 MED ORDER — AMPHETAMINE-DEXTROAMPHET ER 20 MG PO CP24: 20.0000 mg | ORAL_CAPSULE | Freq: Every day | ORAL | Status: DC

## 2015-09-10 NOTE — Telephone Encounter (Signed)
Rx. awaiting [facility] sig/fim 

## 2015-09-10 NOTE — Telephone Encounter (Signed)
Pt requested rx amphetamine-dextroamphetamine (ADDERALL XR) 20 MG 24 hr capsule. Thank you

## 2015-09-10 NOTE — Telephone Encounter (Signed)
Adderall rx. up front [facility]/fim 

## 2015-09-18 ENCOUNTER — Telehealth: Payer: Self-pay | Admitting: Neurology

## 2015-09-18 NOTE — Telephone Encounter (Signed)
Patient is calling to get a written Rx for amphetamine-dextroamphetamine (ADDERALL XR) 20 MG 24 hr capsule. I advised the Rx will be ready in 24 hours unless the nurse advises otherwise.

## 2015-09-18 NOTE — Telephone Encounter (Signed)
Last rx. was just given 09-10-15.  I have spoken with pt's gf who sts. he picked that rx. up, had it filled,  but now med is missing--not sure if it was misplaced or stolen.  Sts. Tecfidera was missing as well--they have spoken with pharmacy who will send a r/f of Tecfidera.  I have explained that Adderall is a controlled substance; we are generally not allowed to replace lost/stolen rx's for controlled substances.  As pt. is a new pt. to [facility], has never called for early r/f before, will check with [facility] and call her back./fim

## 2015-09-19 NOTE — Telephone Encounter (Signed)
I have spoken with Mike Lowery, and explained that, because Adderall is a controlled substance--is a stimulant--that [facility] is not able to  r/f it early.  He verbalized understanding of same/fim

## 2015-09-19 NOTE — Telephone Encounter (Signed)
Patient returned Faith's call. Please call [phone].

## 2015-09-19 NOTE — Telephone Encounter (Signed)
LMTC.  Per RAS, he is not able to replace Adderall rx. prior to due date, which is 10-10-15./fim

## 2015-09-26 ENCOUNTER — Encounter: Payer: Self-pay | Admitting: Neurology

## 2015-09-26 ENCOUNTER — Ambulatory Visit (INDEPENDENT_AMBULATORY_CARE_PROVIDER_SITE_OTHER): Payer: Medicaid Other | Admitting: Neurology

## 2015-09-26 VITALS — BP 114/72 | HR 66 | Resp 12 | Ht 67.0 in | Wt 153.5 lb

## 2015-09-26 DIAGNOSIS — R269 Unspecified abnormalities of gait and mobility: Secondary | ICD-10-CM | POA: Diagnosis not present

## 2015-09-26 DIAGNOSIS — F32A Depression, unspecified: Secondary | ICD-10-CM

## 2015-09-26 DIAGNOSIS — R200 Anesthesia of skin: Secondary | ICD-10-CM | POA: Diagnosis not present

## 2015-09-26 DIAGNOSIS — R5383 Other fatigue: Secondary | ICD-10-CM

## 2015-09-26 DIAGNOSIS — G35 Multiple sclerosis: Secondary | ICD-10-CM | POA: Diagnosis not present

## 2015-09-26 DIAGNOSIS — R4189 Other symptoms and signs involving cognitive functions and awareness: Secondary | ICD-10-CM | POA: Diagnosis not present

## 2015-09-26 DIAGNOSIS — F329 Major depressive disorder, single episode, unspecified: Secondary | ICD-10-CM | POA: Diagnosis not present

## 2015-09-26 MED ORDER — AMPHETAMINE-DEXTROAMPHET ER 20 MG PO CP24: 20.0000 mg | ORAL_CAPSULE | Freq: Every day | ORAL | Status: DC

## 2015-09-26 NOTE — Progress Notes (Addendum)
[facility]  PATIENT: Mike Lowery DOB: [birth year]  REFERRING DOCTOR OR PCP:  Dr. Hyman Hopes SOURCE: Patient, family, records in the EMR, MRI images on PACS  _________________________________   HISTORICAL  CHIEF COMPLAINT:  Chief Complaint  Patient presents with  . Multiple Sclerosis    Sts. he continues to tolerate Tecfidera well.  He is not taking Adderall right now--sts. rx. was either lost or stolen. Denies new or worsening sx/fim    HISTORY OF PRESENT ILLNESS:  Mike Lowery,is a 30 yo man with multiple sclerosis.   He switched to [facility] January 2017 and is tolerating it well.   He denies any GI issues or flushing.  He notes no new exacerbations.    Spasms/spells:    He is noting fewer right sided face/arm spasm spells since starting levetiracetam.    The EEG did not show any epileptiform activity.     They occur on the right side and right face.  Spells are now rare less than 1/week (was 6/day)  There is no LOC.      He also takes baclofen 4 x 10 mg daily for the tonic spasms.      Gait/strength/sensation: He feels his gait is mildly off balanced.   He is not needing a cane.  He has mild weakness and spasticity bilaterally, worse on his left.   He notes painful dysesthesias in his legs and left > right arms.  Baclofen is helping the tonic spasticity. Gabapentin 3 x 300 mg daily helps the tingling pain a bit.     Bladder:    He has noticed urinary frequency and urgency and he has 1 x nocturia which is new.     He feels a bladder symptoms are not bad enough for treatment at this time.  Fatigue/sleep:    He feels tired a lot.     He notes an improvement with Adderall and is also more sleepy without it.   He denies insomnia.    Depression is doing better the past few months.   He stopped Cymbalta  Cognition has been reduced since his diagnosis of MS and his severe initial exacerbation. Over, he feels that it is stable and possibly better than late last year.   He still has a lot of difficulty with decreased focus and attention. He has some word finding difficulties. He has trouble with executive function. He feels Adderall helps his cognitive skills a little bit and helps his fatigue and sleepiness a lot.  MS HISTORY:  In mid 2015, he had the onset of weakness and numbness in the arms and legs, a little worse on the left.   When symptoms persisted, he presented to [facility] emergency room. MRI was consistent with multiple sclerosis and he was admitted. The MRI of the brain showed several plaques, mostly in the periventricular white matter. The MRI of the cervical spine showed several large T2 hyperintense foci, with some enhancement. He was given IV Solu-Medrol and his symptoms improved quite a bit. He followed up with Dr. Everlena Cooper and was placed on Plegridy about 8 or 9 months after starting Plegridy, he had another exacerbation and MRI of the brain and cervical spine were performed again 02/21/2015. The MRI of the cervical spine does not show any definite new lesions. However, the MRI of the brain does show several new lesions including for small enhancing foci. He was switched to [facility] November or December 2016.   I have personally reviewed the MRIs of  the brain and cervical spine from October 2016. The MRI of the brain shows multiple T2/FLAIR hyperintense foci consistent with MS in the hemispheres and brainstem.   4 of the hemispheric small foci enhanced after gadolinium administration. The MRI of the cervical spine shows multiple hyperintense foci. There was a normal enhancement pattern.  REVIEW OF SYSTEMS: Constitutional: No fevers, chills, sweats, or change in appetite.   He has fatigue and sleepiness. Eyes: No visual changes, double vision, eye pain Ear, nose and throat: No hearing loss, ear pain, nasal congestion, sore throat Cardiovascular: No chest pain, palpitations Respiratory: No shortness of breath at rest or with exertion.   No  wheezes GastrointestinaI: No nausea, vomiting, diarrhea, abdominal pain, fecal incontinence Genitourinary: He notes urinary frequency.    Musculoskeletal: No neck pain, back pain Integumentary: No rash, pruritus, skin lesions Neurological: as above Psychiatric: See above Endocrine: No palpitations, diaphoresis, change in appetite, change in weigh or increased thirst Hematologic/Lymphatic: No anemia, purpura, petechiae. Allergic/Immunologic: No itchy/runny eyes, nasal congestion, recent allergic reactions, rashes  ALLERGIES: No Known Allergies  HOME MEDICATIONS:  Current outpatient prescriptions:  .  baclofen (LIORESAL) 10 MG tablet, Take one in the morning, one in the evening and two at bedtime for spasticity (Patient taking differently: Take 10-20 mg by mouth 3 (three) times daily. Take one in the morning, one in the evening and two at bedtime for spasticity), Disp: 120 each, Rfl: 11 .  Dimethyl Fumarate (TECFIDERA) 240 MG CPDR, Take 240 mg by mouth 2 (two) times daily. , Disp: , Rfl:  .  DULoxetine (CYMBALTA) 60 MG capsule, Take 1 capsule (60 mg total) by mouth daily., Disp: 30 capsule, Rfl: 5 .  gabapentin (NEURONTIN) 300 MG capsule, Take one in the morning, one in the evening and two at night for nerve pain (Patient taking differently: Take 300-600 mg by mouth 3 (three) times daily. Take one in the morning, one in the evening and two at night for nerve pain), Disp: 120 capsule, Rfl: 11 .  levETIRAcetam (KEPPRA) 1000 MG tablet, Take 1 tablet in the morning and 1 tablet at night./fim, Disp: 60 tablet, Rfl: 3 .  amphetamine-dextroamphetamine (ADDERALL XR) 20 MG 24 hr capsule, Take 1 capsule (20 mg total) by mouth daily. (Patient not taking: Reported on 09/26/2015), Disp: 30 capsule, Rfl: 0  PAST MEDICAL HISTORY: Past Medical History  Diagnosis Date  . Multiple sclerosis (HCC) 12/24/13  . Multiple sclerosis (HCC)   . Vision abnormalities     PAST SURGICAL HISTORY: Past Surgical  History  Procedure Laterality Date  . No past surgeries      FAMILY HISTORY: Family History  Problem Relation Age of Onset  . Cancer Maternal Grandmother     unknown   . Multiple sclerosis Neg Hx   . Healthy Mother   . Healthy Father   . Ataxia Sister     SOCIAL HISTORY:  Social History   Social History  . Marital Status: Single    Spouse Name: N/A  . Number of Children: N/A  . Years of Education: N/A   Occupational History  . Not on file.   Social History Main Topics  . Smoking status: Former Smoker    Types: Cigarettes    Quit date: 04/18/2013  . Smokeless tobacco: Former Neurosurgeon    Quit date: 09/08/2014  . Alcohol Use: No     Comment: 8 oz liquor q other day social   . Drug Use: No     Comment: former  .  Sexual Activity:    Partners: Female   Other Topics Concern  . Not on file   Social History Narrative     PHYSICAL EXAM  Filed Vitals:   09/26/15 1312  BP: 114/72  Pulse: 66  Resp: 12  Height:  (1.702 m)  Weight: 153 lb 8 oz (69.627 kg)    Body mass index is 24.04 kg/(m^2).   General: The patient is well-developed and well-nourished and in no acute distress   Skin: Extremities are without significant rash or edema.   Neurologic Exam  Mental status: The patient is alert and oriented x 3 at the time of the examination. The patient has apparent normal recent and remote memory, with a reduced attention span and concentration ability.   Speech is normal.  Cranial nerves: Extraocular movements are full.   There is good facial sensation to soft touch bilaterally.Facial strength is normal.  Trapezius and sternocleidomastoid strength is normal. No dysarthria is noted.  The tongue is midline, and the patient has symmetric elevation of the soft palate. No obvious hearing deficits are noted.  Motor:   Muscle bulk is normal.   Tone is increased in legs. Strength is  5 / 5 in all 4 extremities.   Sensory: Sensory testing shows reduced touch in the  right arm and left leg and reduced vibration sensation in the left arm and left leg.  Coordination: Cerebellar testing reveals good finger-nose-finger and poor heel-to-shin bilaterally.  Gait and station: Station is normal.   Gait is wide and mildly spastict. Tandem gait is very wide. Romberg is positive.   Reflexes: Deep tendon reflexes are symmetric and 3 in arms.  In legs, they are increased, L>R.   He has spread at the left knee and non-sustained clonus at left ankle.        DIAGNOSTIC DATA (LABS, IMAGING, TESTING) - I reviewed patient records, labs, notes, testing and imaging myself where available.  Lab Results  Component Value Date   WBC 3.4* 07/20/2015   HGB 14.2 07/20/2015   HCT 42.5 07/20/2015   MCV 86.2 07/20/2015   PLT 250 07/20/2015      Component Value Date/Time   NA 142 07/20/2015 0949   K 4.1 07/20/2015 0949   CL 106 07/20/2015 0949   CO2 28 07/20/2015 0949   GLUCOSE 92 07/20/2015 0949   BUN 11 07/20/2015 0949   CREATININE 0.96 07/20/2015 0949   CREATININE 0.92 03/27/2015 1130   CALCIUM 9.1 07/20/2015 0949   PROT 6.8 03/27/2015 1216   ALBUMIN 4.3 03/27/2015 1216   AST 18 03/27/2015 1216   ALT 33 03/27/2015 1216   ALKPHOS 63 03/27/2015 1216   BILITOT 0.3 03/27/2015 1216   GFRNONAA >60 07/20/2015 0949   GFRNONAA >89 10/10/2014 1251   GFRAA >60 07/20/2015 0949   GFRAA >89 10/10/2014 1251       ASSESSMENT AND PLAN  Multiple sclerosis (HCC)  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   Continue Tecfidera for MS.   He had bloodwork in March so will wait until next visit to re-check 2.    His spells likely represent phasic spasms from his MS. EEG was ok.   Continue Keppra as speels much better since starting. 3.   Renew Adderall for MS related reduced attention and sleepiness.  4.   Mr. Kurtenbach is disabled from work due to combination of cognitive and physical impairments.  He  has stabilized on the current medications, but is not expected to improve to a point that he will be able to return to work.  5.   He will return to see me in 4 months or sooner if there are new or worsening neurologic symptoms. I will want to check the MRI around the time of his next visit.     Raenah Murley A. Epimenio Foot, MD, PhD 09/26/2015, 1:20 PM Certified in Neurology, Clinical Neurophysiology, Sleep Medicine, Pain Medicine and Neuroimaging  [facility] [address], [address] [place], [place] [address] [phone]

## 2015-09-27 NOTE — Progress Notes (Signed)
TODAY'S OV NOTE FAXED TO DR. Andee Poles fax # [phone]

## 2015-10-04 ENCOUNTER — Encounter (HOSPITAL_COMMUNITY): Payer: Self-pay | Admitting: Emergency Medicine

## 2015-10-04 ENCOUNTER — Emergency Department (HOSPITAL_COMMUNITY)
Admission: EM | Admit: 2015-10-04 | Discharge: 2015-10-04 | Disposition: A | Discharge status: Home / Self Care | Payer: Medicaid Other | Attending: Emergency Medicine | Admitting: Emergency Medicine

## 2015-10-04 DIAGNOSIS — Z8669 Personal history of other diseases of the nervous system and sense organs: Secondary | ICD-10-CM | POA: Insufficient documentation

## 2015-10-04 DIAGNOSIS — Z79899 Other long term (current) drug therapy: Secondary | ICD-10-CM | POA: Insufficient documentation

## 2015-10-04 DIAGNOSIS — K0889 Other specified disorders of teeth and supporting structures: Secondary | ICD-10-CM | POA: Insufficient documentation

## 2015-10-04 DIAGNOSIS — Z87891 Personal history of nicotine dependence: Secondary | ICD-10-CM | POA: Insufficient documentation

## 2015-10-04 MED ORDER — PENICILLIN V POTASSIUM 500 MG PO TABS: 500.0000 mg | ORAL_TABLET | Freq: Four times a day (QID) | ORAL | Status: DC

## 2015-10-04 MED ORDER — NAPROXEN 500 MG PO TABS: 500.0000 mg | ORAL_TABLET | Freq: Two times a day (BID) | ORAL | Status: DC

## 2015-10-04 NOTE — ED Provider Notes (Signed)
CSN: [id number]     Arrival date & time 10/04/15  4098 History  By signing my name below, I, Essence Howell, attest that this documentation has been prepared under the direction and in the presence of Sharilyn Sites, PA-C Electronically Signed: Charline Bills, ED Scribe 10/04/2015 at 9:52 AM.   Chief Complaint  Patient presents with  . Dental Pain   The history is provided by the patient. No language interpreter was used.   HPI Comments: Mike Lowery is a 30 y.o. male who presents to the Emergency Department complaining of constant left lower dental pain onset yesterday. Pt had a left lower molar extracted by a dentist, Dr. Chilton Si, yesterday but states that a piece is still in his gum. He tried calling the office but he can not be seen until Monday. Pt reports constant pain to the area that is exacerbated with palpation. No treatments tried PTA. He is not currently taking antibiotics. No fever or any other symptoms reported at this time.  Past Medical History  Diagnosis Date  . Multiple sclerosis (HCC) 12/24/13  . Multiple sclerosis (HCC)   . Vision abnormalities    Past Surgical History  Procedure Laterality Date  . No past surgeries     Family History  Problem Relation Age of Onset  . Cancer Maternal Grandmother     unknown   . Multiple sclerosis Neg Hx   . Healthy Mother   . Healthy Father   . Ataxia Sister    Social History  Substance Use Topics  . Smoking status: Former Smoker    Types: Cigarettes    Quit date: 04/18/2013  . Smokeless tobacco: Former Neurosurgeon    Quit date: 09/08/2014  . Alcohol Use: No     Comment: 8 oz liquor q other day social     Review of Systems  Constitutional: Negative for fever.  HENT: Positive for dental problem.   All other systems reviewed and are negative.  Allergies  Review of patient's allergies indicates no known allergies.  Home Medications   Prior to Admission medications   Medication Sig Start Date End Date Taking? Authorizing  Provider  amphetamine-dextroamphetamine (ADDERALL XR) 20 MG 24 hr capsule Take 1 capsule (20 mg total) by mouth daily. 09/26/15   Asa Lente, MD  baclofen (LIORESAL) 10 MG tablet Take one in the morning, one in the evening and two at bedtime for spasticity Patient taking differently: Take 10-20 mg by mouth 3 (three) times daily. Take one in the morning, one in the evening and two at bedtime for spasticity 07/02/15   Asa Lente, MD  Dimethyl Fumarate (TECFIDERA) 240 MG CPDR Take 240 mg by mouth 2 (two) times daily.     Historical Provider, MD  DULoxetine (CYMBALTA) 60 MG capsule Take 1 capsule (60 mg total) by mouth daily. 05/01/15   Asa Lente, MD  gabapentin (NEURONTIN) 300 MG capsule Take one in the morning, one in the evening and two at night for nerve pain Patient taking differently: Take 300-600 mg by mouth 3 (three) times daily. Take one in the morning, one in the evening and two at night for nerve pain 07/02/15   Asa Lente, MD  levETIRAcetam (KEPPRA) 1000 MG tablet Take 1 tablet in the morning and 1 tablet at night./fim 07/29/15   Asa Lente, MD   BP 125/69 mmHg  Pulse 70  Temp(Src) 97.9 F (36.6 C) (Oral)  Resp 19  Ht  (1.702 m)  Wt  150 lb (68.04 kg)  BMI 23.49 kg/m2  SpO2 100% Physical Exam  Constitutional: He is oriented to person, place, and time. He appears well-developed and well-nourished. No distress.  HENT:  Head: Normocephalic and atraumatic.  Mouth/Throat: Oropharynx is clear and moist.  Teeth largely in fair dentition, left lower molar appears to have been mostly extracted but there is a small remaining piece of tooth present in the gums, surrounding gingiva mildly swollen without definitive abscess, handling secretions appropriately, no trismus, no facial or neck swelling, normal phonation without stridor  Eyes: Conjunctivae and EOM are normal. Pupils are equal, round, and reactive to light.  Neck: Normal range of motion. Neck supple.   Cardiovascular: Normal rate, regular rhythm and normal heart sounds.   Pulmonary/Chest: Effort normal and breath sounds normal. No respiratory distress. He has no wheezes.  Abdominal: Soft. Bowel sounds are normal.  Musculoskeletal: Normal range of motion.  Neurological: He is alert and oriented to person, place, and time.  Skin: Skin is warm and dry. He is not diaphoretic.  Psychiatric: He has a normal mood and affect.  Nursing note and vitals reviewed.  ED Course  Procedures (including critical care time) DIAGNOSTIC STUDIES: Oxygen Saturation is 100% on RA, normal by my interpretation.    COORDINATION OF CARE: 9:50 AM-Discussed treatment plan which includes Veetid and Naproxen with pt at bedside and pt agreed to plan.   Labs Review Labs Reviewed - No data to display  Imaging Review No results found.   EKG Interpretation None      MDM   Final diagnoses:  Pain, dental   30 year old male here with left lower dental pain. Had left molar extracted by dentist yesterday, however he does appear to have a small remaining piece of tooth present in the gums on exam today. His gums are mildly swollen and appear irritated. There is no definitive abscess at this time. No facial or neck swelling to suggest Ludwig's angina. Patient unable to be seen by his dentist today, given information for dentist on call to see if he can possibly be seen sooner, otherwise he will have to follow-up with his dentist on Monday.  Will start on antibiotics for infection prophylaxis.  Discussed plan with patient, he/she acknowledged understanding and agreed with plan of care.  Return precautions given for new or worsening symptoms.  I personally performed the services described in this documentation, which was scribed in my presence. The recorded information has been reviewed and is accurate.  Garlon Hatchet, PA-C 10/04/15 1008  Mancel Bale, MD 10/04/15 2023

## 2015-10-04 NOTE — ED Notes (Signed)
Pt had left lower molar pulled by dentist yesterday. Pt thinks he left some of the tooth in there.  Called his dentist this am, told they could not see him until Monday. Pt has hx of MS.

## 2015-10-04 NOTE — Discharge Instructions (Signed)
Take the prescribed medication as directed. You can try  Calling the dentist on call for today to see if they can help you, if not, you may have to wait until Monday when Dr. Chilton Si can see you. Return to the ED for new or worsening symptoms.

## 2015-11-07 ENCOUNTER — Telehealth: Payer: Self-pay | Admitting: Neurology

## 2015-11-07 MED ORDER — AMPHETAMINE-DEXTROAMPHET ER 20 MG PO CP24
20.0000 mg | ORAL_CAPSULE | Freq: Every day | ORAL | Status: DC
Start: 2015-11-07 — End: 2015-12-05

## 2015-11-07 NOTE — Telephone Encounter (Signed)
Patient called to request refill of amphetamine-dextroamphetamine (ADDERALL XR) 20 MG 24 hr capsule °

## 2015-11-07 NOTE — Telephone Encounter (Signed)
Rx. awaiting [facility] sig/fim 

## 2015-11-07 NOTE — Telephone Encounter (Signed)
Adderall rx. up front [facility]/fim 

## 2015-11-20 ENCOUNTER — Encounter (HOSPITAL_COMMUNITY): Payer: Self-pay | Admitting: Emergency Medicine

## 2015-11-20 DIAGNOSIS — M79662 Pain in left lower leg: Secondary | ICD-10-CM | POA: Insufficient documentation

## 2015-11-20 DIAGNOSIS — Z8739 Personal history of other diseases of the musculoskeletal system and connective tissue: Secondary | ICD-10-CM | POA: Diagnosis not present

## 2015-11-20 DIAGNOSIS — Z87891 Personal history of nicotine dependence: Secondary | ICD-10-CM | POA: Insufficient documentation

## 2015-11-20 DIAGNOSIS — Z79899 Other long term (current) drug therapy: Secondary | ICD-10-CM | POA: Insufficient documentation

## 2015-11-20 NOTE — ED Notes (Signed)
Pt. reports left leg pain onset last week , denies injury/ambulatory , pain increases with movement/walking . Denies SOB or fever.

## 2015-11-21 ENCOUNTER — Emergency Department (HOSPITAL_COMMUNITY)
Admission: EM | Admit: 2015-11-21 | Discharge: 2015-11-21 | Disposition: A | Discharge status: Home / Self Care | Payer: Medicaid Other | Attending: Emergency Medicine | Admitting: Emergency Medicine

## 2015-11-21 DIAGNOSIS — M79605 Pain in left leg: Secondary | ICD-10-CM

## 2015-11-21 MED ORDER — PREDNISONE 10 MG PO TABS: ORAL_TABLET | ORAL | Status: DC

## 2015-11-21 MED ORDER — NAPROXEN 500 MG PO TABS: 500.0000 mg | ORAL_TABLET | Freq: Two times a day (BID) | ORAL | Status: DC

## 2015-11-21 NOTE — ED Provider Notes (Signed)
CSN: [id number]     Arrival date & time 11/20/15  2336 History   First MD Initiated Contact with Patient 11/21/15 0100     Chief Complaint  Patient presents with  . Leg Pain     (Consider location/radiation/quality/duration/timing/severity/associated sxs/prior Treatment) HPI Comments: Patient with history of MS presents with left leg pain for the past 1 week. No injury, trauma. He denies swelling or discoloration. He has had similar pain in the past which was diagnosed as a symptom of his MS. He is followed by Dr. Epimenio Foot and currently takes Tecfidera.  No fever. Pain is intermittent without aggravating or alleviating factors.   Patient is a 30 y.o. male presenting with leg pain. The history is provided by the patient. No language interpreter was used.  Leg Pain Location:  Leg Injury: no   Leg location:  L leg Associated symptoms: no fever     Past Medical History  Diagnosis Date  . Multiple sclerosis (HCC) 12/24/13  . Multiple sclerosis (HCC)   . Vision abnormalities    Past Surgical History  Procedure Laterality Date  . No past surgeries     Family History  Problem Relation Age of Onset  . Cancer Maternal Grandmother     unknown   . Multiple sclerosis Neg Hx   . Healthy Mother   . Healthy Father   . Ataxia Sister    Social History  Substance Use Topics  . Smoking status: Former Smoker    Types: Cigarettes    Quit date: 04/18/2013  . Smokeless tobacco: Former Neurosurgeon    Quit date: 09/08/2014  . Alcohol Use: Yes    Review of Systems  Constitutional: Negative for fever and chills.  Respiratory: Negative.  Negative for shortness of breath.   Cardiovascular: Negative.  Negative for chest pain.  Musculoskeletal:       See HPI  Skin: Negative.   Neurological: Negative.  Negative for numbness.      Allergies  Review of patient's allergies indicates no known allergies.  Home Medications   Prior to Admission medications   Medication Sig Start Date End Date Taking?  Authorizing Provider  amphetamine-dextroamphetamine (ADDERALL XR) 20 MG 24 hr capsule Take 1 capsule (20 mg total) by mouth daily. 11/07/15   Asa Lente, MD  baclofen (LIORESAL) 10 MG tablet Take one in the morning, one in the evening and two at bedtime for spasticity Patient taking differently: Take 10-20 mg by mouth 3 (three) times daily. Take one in the morning, one in the evening and two at bedtime for spasticity 07/02/15   Asa Lente, MD  Dimethyl Fumarate (TECFIDERA) 240 MG CPDR Take 240 mg by mouth 2 (two) times daily.     Historical Provider, MD  DULoxetine (CYMBALTA) 60 MG capsule Take 1 capsule (60 mg total) by mouth daily. 05/01/15   Asa Lente, MD  gabapentin (NEURONTIN) 300 MG capsule Take one in the morning, one in the evening and two at night for nerve pain Patient taking differently: Take 300-600 mg by mouth 3 (three) times daily. Take one in the morning, one in the evening and two at night for nerve pain 07/02/15   Asa Lente, MD  levETIRAcetam (KEPPRA) 1000 MG tablet Take 1 tablet in the morning and 1 tablet at night./fim 07/29/15   Asa Lente, MD  naproxen (NAPROSYN) 500 MG tablet Take 1 tablet (500 mg total) by mouth 2 (two) times daily with a meal. 10/04/15   Rosezella Florida  Sanders, PA-C  penicillin v potassium (VEETID) 500 MG tablet Take 1 tablet (500 mg total) by mouth 4 (four) times daily. 10/04/15   Garlon Hatchet, PA-C   BP 107/65 mmHg  Pulse 74  Temp(Src) 98.1 F (36.7 C) (Oral)  Resp 19  SpO2 98% Physical Exam  Constitutional: He is oriented to person, place, and time. He appears well-developed and well-nourished.  Neck: Normal range of motion.  Pulmonary/Chest: Effort normal.  Musculoskeletal: Normal range of motion.  Left lower extremity without swelling, discoloration, warmth or focal tenderness. FROM.  Neurological: He is alert and oriented to person, place, and time.  Skin: Skin is warm and dry.  Psychiatric: He has a normal mood and affect.     ED Course  Procedures (including critical care time) Labs Review Labs Reviewed - No data to display  Imaging Review No results found. I have personally reviewed and evaluated these images and lab results as part of my medical decision-making.   EKG Interpretation None      MDM   Final diagnoses:  None   1. Left LE pain 2. History of MS  Patient presents with left leg pain, intermittent, x 1 week. Similar to symptoms experienced with MS. Ibuprofen with some relief. No fever.  Per Dr. Bonnita Hollow notes, he was started on Keppra for ?seizures vs phasic spasms. Per notes, he improved on this medication. Symptoms sound similar to presentation tonight. Will provide prednisone on 6-day taper dose for MS exacerbation and give Naproxen for pain. Recommend follow up with Dr. Epimenio Foot.   No swelling to cause concern for DVT. No redness or warmth to cause concern for infection. He can be discharged home with outpatient follow up.  Mike Anis, PA-C 11/21/15 1610  Gilda Crease, MD 11/21/15 [phone]

## 2015-11-21 NOTE — Discharge Instructions (Signed)
Heat Therapy °Heat therapy can help ease sore, stiff, injured, and tight muscles and joints. Heat relaxes your muscles, which may help ease your pain.  °RISKS AND COMPLICATIONS °If you have any of the following conditions, do not use heat therapy unless your health care provider has approved: °· Poor circulation. °· Healing wounds or scarred skin in the area being treated. °· Diabetes, heart disease, or high blood pressure. °· Not being able to feel (numbness) the area being treated. °· Unusual swelling of the area being treated. °· Active infections. °· Blood clots. °· Cancer. °· Inability to communicate pain. This may include young children and people who have problems with their brain function (dementia). °· Pregnancy. °Heat therapy should only be used on old, pre-existing, or long-lasting (chronic) injuries. Do not use heat therapy on new injuries unless directed by your health care provider. °HOW TO USE HEAT THERAPY °There are several different kinds of heat therapy, including: °· Moist heat pack. °· Warm water bath. °· Hot water bottle. °· Electric heating pad. °· Heated gel pack. °· Heated wrap. °· Electric heating pad. °Use the heat therapy method suggested by your health care provider. Follow your health care provider's instructions on when and how to use heat therapy. °GENERAL HEAT THERAPY RECOMMENDATIONS °· Do not sleep while using heat therapy. Only use heat therapy while you are awake. °· Your skin may turn pink while using heat therapy. Do not use heat therapy if your skin turns red. °· Do not use heat therapy if you have new pain. °· High heat or long exposure to heat can cause burns. Be careful when using heat therapy to avoid burning your skin. °· Do not use heat therapy on areas of your skin that are already irritated, such as with a rash or sunburn. °SEEK MEDICAL CARE IF: °· You have blisters, redness, swelling, or numbness. °· You have new pain. °· Your pain is worse. °MAKE SURE  YOU: °· Understand these instructions. °· Will watch your condition. °· Will get help right away if you are not doing well or get worse. °  °This information is not intended to replace advice given to you by your health care provider. Make sure you discuss any questions you have with your health care provider. °  °Document Released: 07/27/2011 Document Revised: 05/25/2014 Document Reviewed: 06/27/2013 °Elsevier Interactive Patient Education ©2016 [employer] ° °Musculoskeletal Pain °Musculoskeletal pain is muscle and boney aches and pains. These pains can occur in any part of the body. Your caregiver may treat you without knowing the cause of the pain. They may treat you if blood or urine tests, X-rays, and other tests were normal.  °CAUSES °There is often not a definite cause or reason for these pains. These pains may be caused by a type of germ (virus). The discomfort may also come from overuse. Overuse includes working out too hard when your body is not fit. Boney aches also come from weather changes. Bone is sensitive to atmospheric pressure changes. °HOME CARE INSTRUCTIONS  °· Ask when your test results will be ready. Make sure you get your test results. °· Only take over-the-counter or prescription medicines for pain, discomfort, or fever as directed by your caregiver. If you were given medications for your condition, do not drive, operate machinery or power tools, or sign legal documents for 24 hours. Do not drink alcohol. Do not take sleeping pills or other medications that may interfere with treatment. °· Continue all activities unless the activities cause   more pain. When the pain lessens, slowly resume normal activities. Gradually increase the intensity and duration of the activities or exercise. °· During periods of severe pain, bed rest may be helpful. Lay or sit in any position that is comfortable. °· Putting ice on the injured area. °¨ Put ice in a bag. °¨ Place a towel between your skin and the  bag. °¨ Leave the ice on for 15 to 20 minutes, 3 to 4 times a day. °· Follow up with your caregiver for continued problems and no reason can be found for the pain. If the pain becomes worse or does not go away, it may be necessary to repeat tests or do additional testing. Your caregiver may need to look further for a possible cause. °SEEK IMMEDIATE MEDICAL CARE IF: °· You have pain that is getting worse and is not relieved by medications. °· You develop chest pain that is associated with shortness or breath, sweating, feeling sick to your stomach (nauseous), or throw up (vomit). °· Your pain becomes localized to the abdomen. °· You develop any new symptoms that seem different or that concern you. °MAKE SURE YOU:  °· Understand these instructions. °· Will watch your condition. °· Will get help right away if you are not doing well or get worse. °  °This information is not intended to replace advice given to you by your health care provider. Make sure you discuss any questions you have with your health care provider. °  °Document Released: 05/04/2005 Document Revised: 07/27/2011 Document Reviewed: 01/06/2013 °Elsevier Interactive Patient Education ©2016 [employer] ° °

## 2015-12-05 ENCOUNTER — Telehealth: Payer: Self-pay | Admitting: Neurology

## 2015-12-05 MED ORDER — AMPHETAMINE-DEXTROAMPHET ER 20 MG PO CP24: 20.0000 mg | ORAL_CAPSULE | Freq: Every day | ORAL | Status: DC

## 2015-12-05 NOTE — Telephone Encounter (Signed)
Rx. awaiting [facility] sig/fim 

## 2015-12-05 NOTE — Telephone Encounter (Signed)
Patient called to request refill of amphetamine-dextroamphetamine (ADDERALL XR) 20 MG 24 hr capsule °

## 2015-12-05 NOTE — Telephone Encounter (Signed)
Adderall rx. up front [facility]/fim 

## 2015-12-30 ENCOUNTER — Telehealth: Payer: Self-pay | Admitting: Neurology

## 2015-12-30 ENCOUNTER — Telehealth: Payer: Self-pay | Admitting: *Deleted

## 2015-12-30 MED ORDER — AMPHETAMINE-DEXTROAMPHET ER 20 MG PO CP24: 20.0000 mg | ORAL_CAPSULE | Freq: Every day | ORAL | 0 refills | Status: DC

## 2015-12-30 NOTE — Telephone Encounter (Signed)
Handicap placard application up front [facility]/fim

## 2015-12-30 NOTE — Telephone Encounter (Signed)
Mike Lowery called regarding how to go about getting handicapped placard for patient, would like to get today.

## 2015-12-30 NOTE — Telephone Encounter (Signed)
Rx. awaiting [facility] sig/fim 

## 2015-12-31 NOTE — Telephone Encounter (Signed)
Adderall rx. up front [facility]/fim 

## 2016-01-11 ENCOUNTER — Encounter (HOSPITAL_COMMUNITY): Payer: Self-pay | Admitting: Emergency Medicine

## 2016-01-11 ENCOUNTER — Emergency Department (HOSPITAL_COMMUNITY)
Admission: EM | Admit: 2016-01-11 | Discharge: 2016-01-11 | Disposition: A | Discharge status: Home / Self Care | Payer: Medicaid Other | Attending: Emergency Medicine | Admitting: Emergency Medicine

## 2016-01-11 DIAGNOSIS — Z87891 Personal history of nicotine dependence: Secondary | ICD-10-CM | POA: Insufficient documentation

## 2016-01-11 DIAGNOSIS — M79605 Pain in left leg: Secondary | ICD-10-CM | POA: Insufficient documentation

## 2016-01-11 MED ORDER — HYDROCODONE-ACETAMINOPHEN 5-325 MG PO TABS
1.0000 | ORAL_TABLET | Freq: Once | ORAL | Status: AC
  Administered 2016-01-11: 1 via ORAL
  Filled 2016-01-11: qty 1

## 2016-01-11 MED ORDER — PREDNISONE 10 MG PO TABS: ORAL_TABLET | ORAL | 0 refills | Status: DC

## 2016-01-11 MED ORDER — NAPROXEN 500 MG PO TABS: 500.0000 mg | ORAL_TABLET | Freq: Two times a day (BID) | ORAL | 0 refills | Status: DC

## 2016-01-11 NOTE — ED Notes (Signed)
Patient presented with c/o pain to the left leg with hx of MS

## 2016-01-11 NOTE — ED Notes (Signed)
Discharge instructions and prescriptions reviewed 

## 2016-01-11 NOTE — ED Provider Notes (Signed)
[facility]-EMERGENCY DEPT Provider Note   CSN: [id number] Arrival date & time: 01/11/16  1414   History   Chief Complaint Chief Complaint  Patient presents with  . Leg Pain    HPI Mike Lowery is a 30 y.o. male.  The history is provided by the patient and medical records.   30 year old male with history of MS presenting with leg pain. Acute on chronic problem. Has been acute for past 2 days. Located in left entire leg, mostly in the radiating to entire leg. Waxing and waning severity. Worse with ambulation, palpation, any movement or exertion. Described as burning and sharp. Severe. Alleviated somewhat by rest. Similar prior episodes in the past have improved with steroid treatment. Patient states this is a symptom of his MS. He has chronic weakness in his proximal left leg and says this is his "bad leg" at baseline.   Past Medical History:  Diagnosis Date  . Multiple sclerosis (HCC) 12/24/13  . Multiple sclerosis (HCC)   . Vision abnormalities     Patient Active Problem List   Diagnosis Date Noted  . Spells (HCC) 07/22/2015  . Depression with anxiety 07/02/2015  . Other fatigue 05/01/2015  . Numbness 05/01/2015  . Gait disturbance 05/01/2015  . Disturbed cognition 05/01/2015  . Erectile dysfunction 05/01/2015  . Multiple sclerosis exacerbation (HCC) 03/11/2015  . Tobacco abuse 03/06/2015  . Nausea without vomiting 11/08/2014  . 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 Relapsing remitting multiple sclerosis (HCC) 10/01/2014  . Swelling of both hands 05/14/2014  . Multiple sclerosis (HCC) 12/24/2013    Past Surgical History:  Procedure Laterality Date  . NO PAST SURGERIES         Home Medications    Prior to Admission medications   Medication Sig Start Date End Date Taking? Authorizing Provider  amphetamine-dextroamphetamine (ADDERALL XR) 20 MG 24 hr capsule Take 1 capsule (20 mg total) by mouth daily. 12/30/15   Asa Lente, MD  baclofen  (LIORESAL) 10 MG tablet Take one in the morning, one in the evening and two at bedtime for spasticity Patient taking differently: Take 10-20 mg by mouth 3 (three) times daily. Take one in the morning, one in the evening and two at bedtime for spasticity 07/02/15   Asa Lente, MD  Dimethyl Fumarate (TECFIDERA) 240 MG CPDR Take 240 mg by mouth 2 (two) times daily.     Historical Provider, MD  DULoxetine (CYMBALTA) 60 MG capsule Take 1 capsule (60 mg total) by mouth daily. 05/01/15   Asa Lente, MD  gabapentin (NEURONTIN) 300 MG capsule Take one in the morning, one in the evening and two at night for nerve pain Patient taking differently: Take 300-600 mg by mouth 3 (three) times daily. Take one in the morning, one in the evening and two at night for nerve pain 07/02/15   Asa Lente, MD  levETIRAcetam (KEPPRA) 1000 MG tablet Take 1 tablet in the morning and 1 tablet at night./fim 07/29/15   Asa Lente, MD  naproxen (NAPROSYN) 500 MG tablet Take 1 tablet (500 mg total) by mouth 2 (two) times daily. 01/11/16   Urban Gibson, MD  penicillin v potassium (VEETID) 500 MG tablet Take 1 tablet (500 mg total) by mouth 4 (four) times daily. 10/04/15   Garlon Hatchet, PA-C  predniSONE (DELTASONE) 10 MG tablet Take 6 tablets on day 1 Take 5 tablets on day 2 Take 4 tablets on day 3 Take 3 tablets on day  4 Take 2 tablets on day 5 Take 1 tablet on day 6 01/11/16   Urban Gibson, MD    Family History Family History  Problem Relation Age of Onset  . Healthy Mother   . Healthy Father   . Cancer Maternal Grandmother     unknown   . Ataxia Sister   . Multiple sclerosis Neg Hx     Social History Social History  Substance Use Topics  . Smoking status: Former Smoker    Types: Cigarettes    Quit date: 04/18/2013  . Smokeless tobacco: Former Neurosurgeon    Quit date: 09/08/2014  . Alcohol use Yes     Allergies   Review of patient's allergies indicates no known allergies.   Review of Systems Review  of Systems  Constitutional: Negative for chills and fever.  Eyes: Negative for visual disturbance.  Respiratory: Negative for shortness of breath.   Cardiovascular: Negative for chest pain.  Gastrointestinal: Negative for abdominal pain.  Musculoskeletal: Positive for myalgias. Negative for arthralgias.  Skin: Negative for color change, rash and wound.  Allergic/Immunologic: Negative for immunocompromised state.  Neurological: Positive for weakness (baseline LLE ). Negative for numbness.  Hematological: Does not bruise/bleed easily.  Psychiatric/Behavioral: Negative for confusion.  All other systems reviewed and are negative.   Physical Exam Updated Vital Signs BP 125/73   Pulse 63   Temp 98.1 F (36.7 C) (Oral)   Resp 18   Wt 67.8 kg   SpO2 99%   BMI 23.42 kg/m   Physical Exam  Constitutional: He is oriented to person, place, and time. He appears well-developed and well-nourished.  HENT:  Head: Normocephalic and atraumatic.  Eyes: Conjunctivae are normal.  Neck: Neck supple.  Cardiovascular: Normal rate and regular rhythm.   No murmur heard. Pulmonary/Chest: Effort normal and breath sounds normal. No respiratory distress.  Abdominal: Soft. There is no tenderness.  Musculoskeletal: He exhibits no edema or deformity.  LLE normal in appearance. No bony abnormality, no shortening of limb. No rashes or wounds. No edema. No TTP over deep veins. 2+ DP pulse.   Neurological: He is alert and oriented to person, place, and time. He displays no tremor. No cranial nerve deficit or sensory deficit. He exhibits normal muscle tone. Coordination normal. GCS eye subscore is 4. GCS verbal subscore is 5. GCS motor subscore is 6.  Strength 5/5 in bilat UE, RLE. 4/5 in proximal LLE, which pt states is baseline. Slight abnormality to gait that pt states is also chronic and baseline, related to his left side being his "bad leg". Normal FTN, rapid hand alternating movements, HTS testing bilat     Skin: Skin is warm and dry.  Psychiatric: He has a normal mood and affect.  Nursing note and vitals reviewed.    ED Treatments / Results  Labs (all labs ordered are listed, but only abnormal results are displayed) Labs Reviewed - No data to display  EKG  EKG Interpretation None       Radiology No results found.  Procedures Procedures (including critical care time)  Medications Ordered in ED Medications  HYDROcodone-acetaminophen (NORCO/VICODIN) 5-325 MG per tablet 1 tablet (1 tablet Oral Given 01/11/16 1914)     Initial Impression / Assessment and Plan / ED Course  I have reviewed the triage vital signs and the nursing notes.  Pertinent labs & imaging results that were available during my care of the patient were reviewed by me and considered in my medical decision making (see chart for details).  Clinical Course    30 year old male with history of MS presenting with acute on chronic left leg pain, as above. AF, VSS. Exam notable for proximal left lower extremity weakness that is apparently baseline per patient. Otherwise unremarkable. No bony deformities or joint effusions, erythema or increased warmth to suggest cellulitis, no edema or tenderness to palpation over deep veins to suggest DVT. Neurovascularly intact. Will treat for possible acute worsening of leg pain related to MS with prednisone taper and naproxen, which patient states has worked well before in past. Advised close follow-up with neurologist. Strict return precautions discussed. Discharged in stable condition.    Case discussed with Dr. Siri ColeZavtiz, who oversaw management of this patient.    Final Clinical Impressions(s) / ED Diagnoses   Final diagnoses:  Left leg pain    New Prescriptions Discharge Medication List as of 01/11/2016  7:09 PM       Urban GibsonJenny Velton Roselle, MD 01/13/16 [id number]    Blane OharaJoshua Zavitz, MD 01/14/16 [phone]

## 2016-01-11 NOTE — ED Triage Notes (Signed)
Pt. Stated, I have MS and my left leg is killing me, it started 2 days ago.

## 2016-01-29 ENCOUNTER — Encounter: Payer: Self-pay | Admitting: Neurology

## 2016-01-29 ENCOUNTER — Ambulatory Visit (INDEPENDENT_AMBULATORY_CARE_PROVIDER_SITE_OTHER): Payer: Medicaid Other | Admitting: Neurology

## 2016-01-29 VITALS — BP 120/66 | HR 68 | Ht 67.0 in | Wt 151.5 lb

## 2016-01-29 DIAGNOSIS — R200 Anesthesia of skin: Secondary | ICD-10-CM

## 2016-01-29 DIAGNOSIS — R269 Unspecified abnormalities of gait and mobility: Secondary | ICD-10-CM

## 2016-01-29 DIAGNOSIS — G35 Multiple sclerosis: Secondary | ICD-10-CM

## 2016-01-29 DIAGNOSIS — R4189 Other symptoms and signs involving cognitive functions and awareness: Secondary | ICD-10-CM | POA: Diagnosis not present

## 2016-01-29 DIAGNOSIS — F418 Other specified anxiety disorders: Secondary | ICD-10-CM

## 2016-01-29 MED ORDER — AMPHETAMINE-DEXTROAMPHET ER 20 MG PO CP24: 20.0000 mg | ORAL_CAPSULE | Freq: Every day | ORAL | 0 refills | Status: DC

## 2016-01-29 NOTE — Progress Notes (Signed)
[facility]  PATIENT: Mike Lowery DOB: Apr 09, 1986  REFERRING DOCTOR OR PCP:  Dr. Hyman HopesJegede SOURCE: Patient, family, records in the EMR, MRI images on PACS  _________________________________   HISTORICAL  CHIEF COMPLAINT:  Chief Complaint  Patient presents with  . Multiple Sclerosis    Sts. he continues to tolerate Tecfidera well.  Sts. he doesn't feel like he's improving but doesn't think he's getting better either.  He would like to know if other meds might help him more. He would like to try PT for gait/balance/strength in legs/fim    HISTORY OF PRESENT ILLNESS:  Mike Lowery,is a 30 yo man with multiple sclerosis.   He switched to [facility] January 2017 and is tolerating it well.   He denies any GI issues or flushing.  He notes no new exacerbations.    Spasms/spells:    He no longer gets  right sided face/arm spells with spasming since starting levetiracetam.   EEG did not show any epileptiform activity.    There was no LOC.      He takes baclofen 4 x 10 mg daily for the tonic spasms.    He gets other spells involving the left leg where it is painful and   Gait/strength/sensation: Gait is off balanced and he stumbles.   He does not use a cane.  He has mild weakness and spasticity bilaterally, worse on his left.   He notes painful dysesthesias in his legs and left > right arms.  Baclofen is helping the tonic spasticity. He has painful dysesthesias, esp the left leg.   Gabapentin 3 x 300 mg daily helps.    Bladder:    He has noticed urinary frequency and urgency and he has 1 x nocturia which is new.     No incontinence Fatigue/sleep:    He has a lot of fatigue.     He notes an improvement with Adderall and is less sleepy during the day.   He denies insomnia.    Depression is doing better and he no longer needs Cymbalta  Cognition is reduced since initial severe exacerbation.He feels stable and a little better than late last year but not near baseline.  He  still has a lot of difficulty with decreased focus and attention. He has some word finding difficulties. He has trouble with executive function. Adderall helps his cognitive skills a little bit and helps his fatigue and sleepiness a lot.  MS HISTORY:  In mid 2015, he had the onset of weakness and numbness in the arms and legs, a little worse on the left.   When symptoms persisted, he presented to [facility] room. MRI was consistent with multiple sclerosis and he was admitted. The MRI of the brain showed several plaques, mostly in the periventricular white matter. The MRI of the cervical spine showed several large T2 hyperintense foci, with some enhancement. He was given IV Solu-Medrol and his symptoms improved quite a bit. He followed up with Dr. Everlena CooperJaffe and was placed on Plegridy about 8 or 9 months after starting Plegridy, he had another exacerbation and MRI of the brain and cervical spine were performed again 02/21/2015. The MRI of the cervical spine does not show any definite new lesions. However, the MRI of the brain does show several new lesions including for small enhancing foci. [facility] November or December 2016.   I have personally reviewed the MRIs of the brain and cervical spine from October 2016. The MRI of  the brain shows multiple T2/FLAIR hyperintense foci consistent with MS in the hemispheres and brainstem.   4 of the hemispheric small foci enhanced after gadolinium administration. The MRI of the cervical spine shows multiple hyperintense foci. There was a normal enhancement pattern.  REVIEW OF SYSTEMS: Constitutional: No fevers, chills, sweats, or change in appetite.   He has fatigue and sleepiness. Eyes: No visual changes, double vision, eye pain Ear, nose and throat: No hearing loss, ear pain, nasal congestion, sore throat Cardiovascular: No chest pain, palpitations Respiratory: No shortness of breath at rest or with exertion.   No wheezes GastrointestinaI:  No nausea, vomiting, diarrhea, abdominal pain, fecal incontinence Genitourinary: He notes urinary frequency.    Musculoskeletal: No neck pain, back pain Integumentary: No rash, pruritus, skin lesions Neurological: as above Psychiatric: See above Endocrine: No palpitations, diaphoresis, change in appetite, change in weigh or increased thirst Hematologic/Lymphatic: No anemia, purpura, petechiae. Allergic/Immunologic: No itchy/runny eyes, nasal congestion, recent allergic reactions, rashes  ALLERGIES: No Known Allergies  HOME MEDICATIONS:  Current Outpatient Prescriptions:  .  amphetamine-dextroamphetamine (ADDERALL XR) 20 MG 24 hr capsule, Take 1 capsule (20 mg total) by mouth daily., Disp: 30 capsule, Rfl: 0 .  baclofen (LIORESAL) 10 MG tablet, Take one in the morning, one in the evening and two at bedtime for spasticity (Patient taking differently: Take 10-20 mg by mouth 3 (three) times daily. Take one in the morning, one in the evening and two at bedtime for spasticity), Disp: 120 each, Rfl: 11 .  Dimethyl Fumarate (TECFIDERA) 240 MG CPDR, Take 240 mg by mouth 2 (two) times daily. , Disp: , Rfl:  .  DULoxetine (CYMBALTA) 60 MG capsule, Take 1 capsule (60 mg total) by mouth daily., Disp: 30 capsule, Rfl: 5 .  gabapentin (NEURONTIN) 300 MG capsule, Take one in the morning, one in the evening and two at night for nerve pain (Patient taking differently: Take 300-600 mg by mouth 3 (three) times daily. Take one in the morning, one in the evening and two at night for nerve pain), Disp: 120 capsule, Rfl: 11 .  levETIRAcetam (KEPPRA) 1000 MG tablet, Take 1 tablet in the morning and 1 tablet at night./fim, Disp: 60 tablet, Rfl: 3 .  naproxen (NAPROSYN) 500 MG tablet, Take 1 tablet (500 mg total) by mouth 2 (two) times daily., Disp: 30 tablet, Rfl: 0 .  penicillin v potassium (VEETID) 500 MG tablet, Take 1 tablet (500 mg total) by mouth 4 (four) times daily., Disp: 40 tablet, Rfl: 0 .  predniSONE  (DELTASONE) 10 MG tablet, Take 6 tablets on day 1 Take 5 tablets on day 2 Take 4 tablets on day 3 Take 3 tablets on day 4 Take 2 tablets on day 5 Take 1 tablet on day 6, Disp: 21 tablet, Rfl: 0  PAST MEDICAL HISTORY: Past Medical History:  Diagnosis Date  . Multiple sclerosis (HCC) 12/24/13  . Multiple sclerosis (HCC)   . Vision abnormalities     PAST SURGICAL HISTORY: Past Surgical History:  Procedure Laterality Date  . NO PAST SURGERIES      FAMILY HISTORY: Family History  Problem Relation Age of Onset  . Healthy Mother   . Healthy Father   . Cancer Maternal Grandmother     unknown   . Ataxia Sister   . Multiple sclerosis Neg Hx     SOCIAL HISTORY:  Social History   Social History  . Marital status: Single    Spouse name: N/A  . Number of children:  N/A  . Years of education: N/A   Occupational History  . Not on file.   Social History Main Topics  . Smoking status: Former Smoker    Types: Cigarettes    Quit date: 04/18/2013  . Smokeless tobacco: Former Neurosurgeon    Quit date: 09/08/2014  . Alcohol use Yes  . Drug use: No     Comment: former  . Sexual activity: Yes    Partners: Female   Other Topics Concern  . Not on file   Social History Narrative  . No narrative on file     PHYSICAL EXAM  Vitals:   01/29/16 1311  BP: 120/66  Pulse: 68  Weight: 151 lb 8 oz (68.7 kg)  Height: 5\' 7"  (1.702 m)    Body mass index is 23.73 kg/m.   General: The patient is well-developed and well-nourished and in no acute distress   Skin: Extremities are without significant rash or edema.   Neurologic Exam  Mental status: The patient is alert and oriented x 3 at the time of the examination. The patient has apparent normal recent and remote memory, with a reduced attention span and concentration ability.   Speech is normal.  Cranial nerves: Extraocular movements are full.   There is good facial sensation to soft touch bilaterally.Facial strength is normal.  Trapezius  and sternocleidomastoid strength is normal. No dysarthria is noted.  The tongue is midline, and the patient has symmetric elevation of the soft palate. No obvious hearing deficits are noted.  Motor:   Muscle bulk is normal.   Tone is increased in legs. Strength is  5 / 5 in all 4 extremities.   Sensory: Sensory testing shows reduced touch in the right arm and left leg and reduced vibration sensation in the left arm and left leg.  Coordination: Cerebellar testing reveals good finger-nose-finger and poor heel-to-shin bilaterally.  Gait and station: Station is normal.   Gait is wide and mildly spastict. Tandem gait is very wide. Romberg is positive.   Reflexes: Deep tendon reflexes are symmetric and 3 in arms.  In legs, they are increased, L>R.   He has spread at the left knee and non-sustained clonus at left ankle.        DIAGNOSTIC DATA (LABS, IMAGING, TESTING) - I reviewed patient records, labs, notes, testing and imaging myself where available.  Lab Results  Component Value Date   WBC 3.4 (L) 07/20/2015   HGB 14.2 07/20/2015   HCT 42.5 07/20/2015   MCV 86.2 07/20/2015   PLT 250 07/20/2015      Component Value Date/Time   NA 142 07/20/2015 0949   K 4.1 07/20/2015 0949   CL 106 07/20/2015 0949   CO2 28 07/20/2015 0949   GLUCOSE 92 07/20/2015 0949   BUN 11 07/20/2015 0949   CREATININE 0.96 07/20/2015 0949   CREATININE 0.92 03/27/2015 1130   CALCIUM 9.1 07/20/2015 0949   PROT 6.8 03/27/2015 1216   ALBUMIN 4.3 03/27/2015 1216   AST 18 03/27/2015 1216   ALT 33 03/27/2015 1216   ALKPHOS 63 03/27/2015 1216   BILITOT 0.3 03/27/2015 1216   GFRNONAA >60 07/20/2015 0949   GFRNONAA >89 10/10/2014 1251   GFRAA >60 07/20/2015 0949   GFRAA >89 10/10/2014 1251       ASSESSMENT AND PLAN  Multiple sclerosis (HCC) - Plan: Ambulatory referral to Physical Therapy, CBC with Differential/Platelet  Gait disturbance - Plan: Ambulatory referral to Physical  Therapy  Numbness  Depression with anxiety  Disturbed cognition   1.   Continue Tecfidera for MS.   Check CBC.   We will check an MRI of the brain to assess for the possibility of subclinical progression. If present, I would want him to switch from Tecfidera to one of the more efficacious intravenous medications. 2.    PT referral for gait 3.   Renew Adderall for MS related reduced attention and sleepiness.  4.   Mr. Carpenito is disabled from work due to combination of cognitive and physical impairments. He has stabilized on the current medications, but he is not expected to improve to a point that he will be able to return to work.  5.   He will return to see me in 4 months or sooner if there are new or worsening neurologic symptoms.  40 minutes face-to-face evaluation with greater than one half of the time counseling and coordinating care about his MS, physical and cognitive impairments and other symptoms.   Richard A. Epimenio Foot, MD, PhD 01/29/2016, 1:31 PM Certified in Neurology, Clinical Neurophysiology, Sleep Medicine, Pain Medicine and Neuroimaging  [facility] [address], [address] [place], [place] [address] [phone]

## 2016-01-30 LAB — CBC WITH DIFFERENTIAL/PLATELET
BASOS ABS: 0 10*3/uL (ref 0.0–0.2)
BASOS: 1 %
EOS (ABSOLUTE): 0.1 10*3/uL (ref 0.0–0.4)
Eos: 2 %
Hematocrit: 38.6 % (ref 37.5–51.0)
Hemoglobin: 13.3 g/dL (ref 12.6–17.7)
Immature Grans (Abs): 0 10*3/uL (ref 0.0–0.1)
Immature Granulocytes: 0 %
Lymphocytes Absolute: 2.3 10*3/uL (ref 0.7–3.1)
Lymphs: 49 %
MCH: 28.7 pg (ref 26.6–33.0)
MCHC: 34.5 g/dL (ref 31.5–35.7)
MCV: 83 fL (ref 79–97)
MONOS ABS: 0.3 10*3/uL (ref 0.1–0.9)
Monocytes: 7 %
NEUTROS PCT: 41 %
Neutrophils Absolute: 1.9 10*3/uL (ref 1.4–7.0)
PLATELETS: 274 10*3/uL (ref 150–379)
RBC: 4.63 x10E6/uL (ref 4.14–5.80)
RDW: 13 % (ref 12.3–15.4)
WBC: 4.7 10*3/uL (ref 3.4–10.8)

## 2016-02-24 ENCOUNTER — Encounter: Payer: Self-pay | Admitting: Rehabilitation

## 2016-02-24 ENCOUNTER — Telehealth: Payer: Self-pay | Admitting: Neurology

## 2016-02-24 ENCOUNTER — Ambulatory Visit: Payer: Medicaid Other | Attending: Neurology | Admitting: Rehabilitation

## 2016-02-24 DIAGNOSIS — R2689 Other abnormalities of gait and mobility: Secondary | ICD-10-CM

## 2016-02-24 DIAGNOSIS — R2681 Unsteadiness on feet: Secondary | ICD-10-CM | POA: Diagnosis not present

## 2016-02-24 DIAGNOSIS — M6281 Muscle weakness (generalized): Secondary | ICD-10-CM | POA: Diagnosis present

## 2016-02-24 NOTE — Telephone Encounter (Signed)
Patient came in and said the last time he was here he got some blood work done and he hasn't heard anything about what the results were. The best number to contact the patient is [phone]

## 2016-02-24 NOTE — Therapy (Signed)
[facility] [address] [place], [place], [address] Phone: [phone]   Fax:  [phone]  Physical Therapy Treatment  Patient Details  Name: Mike Lowery MRN: [id number] Date of Birth: 01-23-1986 Referring Provider: Jeanella Anton, MD  Encounter Date: 02/24/2016      PT End of Session - 02/24/16 1950    Visit Number 1   Number of Visits 4   Date for PT Re-Evaluation 05/24/16   Authorization Type MCD-awaiting approval from [place]   PT Start Time 1316   PT Stop Time 1402   PT Time Calculation (min) 46 min   Activity Tolerance Patient tolerated treatment well   Behavior During Therapy [facility] for tasks assessed/performed      Past Medical History:  Diagnosis Date  . Multiple sclerosis (HCC) 12/24/13  . Multiple sclerosis (HCC)   . Vision abnormalities     Past Surgical History:  Procedure Laterality Date  . NO PAST SURGERIES      There were no vitals filed for this visit.      Subjective Assessment - 02/24/16 1320    Subjective "My legs are what bring me here."    Patient is accompained by: Family member   Pertinent History [place]   Limitations House hold activities;Walking   Patient Stated Goals "to be able to run"    Currently in Pain? No/denies            [facility] PT Assessment - 02/24/16 0001      Assessment   Medical Diagnosis MS  Diagnosed two years ago   Referring Provider Jeanella Anton, MD   Onset Date/Surgical Date --  in the past year, started to notice legs giving out on him   Prior Therapy OP PT     Precautions   Precautions Fall     Restrictions   Weight Bearing Restrictions No     Balance Screen   Has the patient fallen in the past 6 months No   Has the patient had a decrease in activity level because of a fear of falling?  No   Is the patient reluctant to leave their home because of a fear of falling?  No     Home Tourist information centre manager residence   Living  Arrangements Spouse/significant other   Available Help at Discharge Family   Type of Home Apartment   Home Access Stairs to enter   Entrance Stairs-Number of Steps 3   Entrance Stairs-Rails None  brick wall to hold onto   Home Layout Two level   Alternate Level Stairs-Number of Steps 15   Alternate Level Stairs-Rails Left   Home Equipment None     Prior Function   Level of Independence Independent   Leisure play games, play cards, play with kids and dogs     Cognition   Overall Cognitive Status Impaired/Different from baseline   Area of Impairment Memory   Memory Decreased short-term memory  wife reports decreased memory     Sensation   Light Touch Impaired Detail   Light Touch Impaired Details Impaired RLE;Impaired LLE   Hot/Cold Appears Intact   Proprioception Appears Intact     Coordination   Gross Motor Movements are Fluid and Coordinated Yes   Heel Shin Test WFL, somewhat decreased excursion with R over L     ROM / Strength   AROM / PROM / Strength Strength     Strength   Overall Strength Within functional limits for  tasks performed     Transfers   Transfers Sit to Stand;Stand to Sit   Sit to Stand 7: Independent   Five time sit to stand comments  10.57 secs   Stand to Sit 7: Independent     Ambulation/Gait   Ambulation/Gait Yes   Ambulation/Gait Assistance 5: Supervision   Ambulation Distance (Feet) 230 Feet   Assistive device None   Gait Pattern Step-through pattern;Decreased arm swing - right;Decreased arm swing - left;Decreased stride length;Decreased hip/knee flexion - right;Decreased hip/knee flexion - left;Decreased dorsiflexion - right;Decreased dorsiflexion - left;Shuffle;Trunk flexed;Lateral hip instability   Ambulation Surface Level;Indoor   Gait velocity 3.51 ft/sec   Stairs Yes   Stairs Assistance 6: Modified independent (Device/Increase time)   Stair Management Technique Two rails;Step to pattern;Forwards   Number of Stairs 4   Height of  Stairs 6     Standardized Balance Assessment   Standardized Balance Assessment Timed Up and Go Test     Timed Up and Go Test   TUG Normal TUG   Normal TUG (seconds) 9.34                             PT Education - 02/24/16 1950    Education provided Yes   Education Details evaluation findings, POC, goals, initial HEP   Person(s) Educated Patient;Spouse   Methods Explanation;Demonstration;Handout   Comprehension Verbalized understanding;Returned demonstration          PT Short Term Goals - 02/24/16 2002      PT SHORT TERM GOAL #1   Title Pt will in be IND in performing HEP to improve strength, balance, and flexibility. Target date: by second visit following evaluation   Baseline intiated HEP on 02/24/16   Status New     PT SHORT TERM GOAL #2   Title Pt will verbalize understanding of energy conservation techniques in order to reduce fatigue and improve safety during functional mobility. Target date: by second visit following eval   Baseline Did not assess due to time contraints   Status New           PT Long Term Goals - 02/24/16 2004      PT LONG TERM GOAL #1   Title Pt will be able to maintain gait speed of >2.62 ft/sec x 5 mins in order to indicate safe negotiation in community.  (Target Date:  Third visit following eval)   Baseline 3.51 ft/sec, however only able to maintain approx 100' on 02/24/16   Status New     PT LONG TERM GOAL #2   Title Pt will be able to perform SLS x 15 secs on each LE in order to indicate improved strength and balance.     Baseline only able to perform up to 5 secs on each LE 02/24/16   Status New     PT LONG TERM GOAL #3   Title Pt will ambulate 1000' over unlevel paved surfaces w/ LRAD at mod I level in order to improve functional mobility.     Baseline 230' at S level over even terrain   Status New     PT LONG TERM GOAL #4   Title Pt will ascend/descend up to 15 stairs with single rail alternating pattern at mod  I level in order to negotiate in home safely.     Baseline 4 steps w/ B rails at mod I level, step to fashion   Status New  Plan - 02/24/16 1952    Clinical Impression Statement Pt presents with diagnosis of MS (G35) diagnosed approx 2.5 years ago with worsening weakness in BLEs that seems to alternate between L and R but seems to be more persistent in LLE.  Also note decreased balance and endurance.  Note pt unable to return to work and is limited at home as he has a 33 year old daughter.  Also note history of depresssion that could impact therapy.  Upon PT evaluation, note decreased strength in B hip muscles, decreased gait speed esp with fatigue and gait compensations with fatigue.  Pt is of evolving presentation and moderate complexity from PT POC standpoint.  Feel that pt would greatly benefit from skilled OP neuro PT.     Rehab Potential Good   Clinical Impairments Affecting Rehab Potential nature of disease   PT Frequency [place]  3 visits over 3 months   PT Duration Other (comment)  3 visits over 3 months   PT Treatment/Interventions ADLs/Self Care Home Management;Electrical Stimulation;DME Instruction;Gait training;Stair training;Functional mobility training;Therapeutic activities;Therapeutic exercise;Balance training;Neuromuscular re-education;Patient/family education;Orthotic Fit/Training;Energy conservation;Vestibular   PT Next Visit Plan check compliance of current HEP, add strengthening and balance-focus on HEP as limited by visits   Consulted and Agree with Plan of Care Patient;Family member/caregiver   Family Member Consulted spouse      Patient will benefit from skilled therapeutic intervention in order to improve the following deficits and impairments:  Abnormal gait, Decreased activity tolerance, Decreased balance, Decreased endurance, Decreased knowledge of use of DME, Decreased mobility, Decreased strength, Increased muscle spasms, Impaired flexibility,  Improper body mechanics, Postural dysfunction, Impaired sensation  Visit Diagnosis: Unsteadiness on feet - Plan: PT plan of care cert/re-cert  Muscle weakness (generalized) - Plan: PT plan of care cert/re-cert  Other abnormalities of gait and mobility - Plan: PT plan of care cert/re-cert     Problem List Patient Active Problem List   Diagnosis Date Noted  . Spells 07/22/2015  . Depression with anxiety 07/02/2015  . Other fatigue 05/01/2015  . Numbness 05/01/2015  . Gait disturbance 05/01/2015  . Disturbed cognition 05/01/2015  . Erectile dysfunction 05/01/2015  . Multiple sclerosis exacerbation (HCC) 03/11/2015  . Tobacco abuse 03/06/2015  . Nausea without vomiting 11/08/2014  . 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 Relapsing remitting multiple sclerosis (HCC) 10/01/2014  . Swelling of both hands 05/14/2014  . Multiple sclerosis (HCC) 12/24/2013    Harriet Butte, PT, MPT [facility] [address] [place], [place], [address] Phone: [phone]   Fax:  [phone] 02/24/16, 8:14 PM  Name: FINN AMOS MRN: [id number] Date of Birth: 07-29-1985

## 2016-02-24 NOTE — Patient Instructions (Signed)
Feet Together (Compliant Surface) Head Motion - Eyes Open    With eyes open, standing on compliant surface: ___pillow or cushion_____, feet together, move head slowly: up and down x 10 reps, side to side x 10 reps  Repeat _1___ times per session. Do _2___ sessions per day.  Copyright  VHI. All rights reserved.   Feet Together (Compliant Surface) Arm Motion - Eyes Closed    Stand on compliant surface: ___pillow_____ with feet together. Close eyes and keeps arms by your side.  Repeat __3__ times per session x 30 secs each, hold your head straight. Do __2__ sessions per day.  Copyright  VHI. All rights reserved.   Abduction    Lift leg up toward ceiling and back just slightly. Return. Use no weight on your ankle.  Repeat __10__ times each leg. Do _2___ sessions per day.  http://gt2.exer.us/385   Copyright  VHI. All rights reserved.   Hip Flexion / Knee Extension: Straight-Leg Raise (Eccentric)    Lie on back. Lift leg with knee straight. Slowly lower leg for 3-5 seconds. _10__ reps per set, _2__ sets per day, _5-7__ days per week. Lower like elevator, stopping at each floor.     Copyright  VHI. All rights reserved.

## 2016-02-24 NOTE — Telephone Encounter (Signed)
Spoke with patient and informed him he had CBC done, results are good. He verbalized appreciation, stated he thought results were okay since he didn't get a call back. He stated he just wanted to be sure. He had no other questions. He thanked this Charity fundraiserN for call back.

## 2016-02-27 ENCOUNTER — Telehealth: Payer: Self-pay | Admitting: Neurology

## 2016-02-27 MED ORDER — AMPHETAMINE-DEXTROAMPHET ER 20 MG PO CP24: 20.0000 mg | ORAL_CAPSULE | Freq: Every day | ORAL | 0 refills | Status: DC

## 2016-02-27 NOTE — Telephone Encounter (Signed)
Patient requesting refill of amphetamine-dextroamphetamine (ADDERALL XR) 20 MG 24 hr capsule °Pharmacy pick up ° °

## 2016-02-27 NOTE — Telephone Encounter (Signed)
Rx. awaiting [facility] sig/fim 

## 2016-02-27 NOTE — Telephone Encounter (Signed)
Adderall rx. up front [facility]/fim 

## 2016-03-12 ENCOUNTER — Emergency Department (HOSPITAL_COMMUNITY)
Admission: EM | Admit: 2016-03-12 | Discharge: 2016-03-12 | Disposition: A | Discharge status: Home / Self Care | Payer: Medicaid Other | Attending: Emergency Medicine | Admitting: Emergency Medicine

## 2016-03-12 ENCOUNTER — Encounter (HOSPITAL_COMMUNITY): Payer: Self-pay | Admitting: Emergency Medicine

## 2016-03-12 DIAGNOSIS — Z87891 Personal history of nicotine dependence: Secondary | ICD-10-CM | POA: Diagnosis not present

## 2016-03-12 DIAGNOSIS — H9201 Otalgia, right ear: Secondary | ICD-10-CM | POA: Diagnosis present

## 2016-03-12 DIAGNOSIS — H6121 Impacted cerumen, right ear: Secondary | ICD-10-CM | POA: Insufficient documentation

## 2016-03-12 MED ORDER — CARBAMIDE PEROXIDE 6.5 % OT SOLN: 5.0000 [drp] | Freq: Two times a day (BID) | OTIC | 0 refills | Status: DC

## 2016-03-12 NOTE — ED Provider Notes (Signed)
[facility]-EM[facility]ENCY DEPT Provider Note   CSN: [id number] Arrival date & time: 03/12/16  0305     History   Chief Complaint Chief Complaint  Patient presents with  . Otalgia    HPI Mike Lowery is a 30 y.o. male.  Mckyle TANIA FRIELING is a 30 y.o. Male who presents to the ED complaining of right ear pain since this evening. He reports he was cleaning out his right ear with a q-tip when he had ear pain and felt like his hearing was muffled. He denies ear discharge. He denies complete loss of hearing. He attempted no treatments prior to arrival. He denies fevers, head injury, or other complaints.    The history is provided by the patient. No language interpreter was used.  Otalgia  Pertinent negatives include no ear discharge, no headaches, no sore throat and no rash.    Past Medical History:  Diagnosis Date  . Multiple sclerosis (HCC) 12/24/13  . Multiple sclerosis (HCC)   . Vision abnormalities     Patient Active Problem List   Diagnosis Date Noted  . Spells 07/22/2015  . Depression with anxiety 07/02/2015  . Other fatigue 05/01/2015  . Numbness 05/01/2015  . Gait disturbance 05/01/2015  . Disturbed cognition 05/01/2015  . Erectile dysfunction 05/01/2015  . Multiple sclerosis exacerbation (HCC) 03/11/2015  . Tobacco abuse 03/06/2015  . Nausea without vomiting 11/08/2014  . 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 Relapsing remitting multiple sclerosis (HCC) 10/01/2014  . Swelling of both hands 05/14/2014  . Multiple sclerosis (HCC) 12/24/2013    Past Surgical History:  Procedure Laterality Date  . NO PAST SURGERIES         Home Medications    Prior to Admission medications   Medication Sig Start Date End Date Taking? Authorizing Provider  amphetamine-dextroamphetamine (ADDERALL XR) 20 MG 24 hr capsule Take 1 capsule (20 mg total) by mouth daily. 02/27/16   Asa Lente, MD  baclofen (LIORESAL) 10 MG tablet Take one in the morning, one in the  evening and two at bedtime for spasticity Patient taking differently: Take 10-20 mg by mouth 3 (three) times daily. Take one in the morning, one in the evening and two at bedtime for spasticity 07/02/15   Asa Lente, MD  carbamide peroxide (DEBROX) 6.5 % otic solution Place 5 drops into the right ear 2 (two) times daily. 03/12/16   Everlene Farrier, PA-C  Dimethyl Fumarate (TECFIDERA) 240 MG CPDR Take 240 mg by mouth 2 (two) times daily.     Historical Provider, MD  DULoxetine (CYMBALTA) 60 MG capsule Take 1 capsule (60 mg total) by mouth daily. 05/01/15   Asa Lente, MD  gabapentin (NEURONTIN) 300 MG capsule Take one in the morning, one in the evening and two at night for nerve pain Patient taking differently: Take 300-600 mg by mouth 3 (three) times daily. Take one in the morning, one in the evening and two at night for nerve pain 07/02/15   Asa Lente, MD  levETIRAcetam (KEPPRA) 1000 MG tablet Take 1 tablet in the morning and 1 tablet at night./fim 07/29/15   Asa Lente, MD  naproxen (NAPROSYN) 500 MG tablet Take 1 tablet (500 mg total) by mouth 2 (two) times daily. 01/11/16   Urban Gibson, MD  penicillin v potassium (VEETID) 500 MG tablet Take 1 tablet (500 mg total) by mouth 4 (four) times daily. 10/04/15   Garlon Hatchet, PA-C  predniSONE (DELTASONE) 10 MG tablet  Take 6 tablets on day 1 Take 5 tablets on day 2 Take 4 tablets on day 3 Take 3 tablets on day 4 Take 2 tablets on day 5 Take 1 tablet on day 6 Patient not taking: Reported on 02/24/2016 01/11/16   Urban Gibson, MD    Family History Family History  Problem Relation Age of Onset  . Healthy Mother   . Healthy Father   . Cancer Maternal Grandmother     unknown   . Ataxia Sister   . Multiple sclerosis Neg Hx     Social History Social History  Substance Use Topics  . Smoking status: Former Smoker    Types: Cigarettes    Quit date: 04/18/2013  . Smokeless tobacco: Former Neurosurgeon    Quit date: 09/08/2014  . Alcohol  use Yes     Allergies   Review of patient's allergies indicates no known allergies.   Review of Systems Review of Systems  Constitutional: Negative for fever.  HENT: Positive for ear pain and sneezing. Negative for ear discharge, postnasal drip and sore throat.   Eyes: Negative for visual disturbance.  Skin: Negative for rash.  Neurological: Negative for headaches.     Physical Exam Updated Vital Signs BP 112/68 (BP Location: Left Arm)   Pulse 68   Temp 98.2 F (36.8 C) (Oral)   Resp 16   Ht 5\' 7"  (1.702 m)   Wt 68 kg   SpO2 99%   BMI 23.49 kg/m   Physical Exam  Constitutional: He appears well-developed and well-nourished. No distress.  Nontoxic appearing.  HENT:  Head: Normocephalic and atraumatic.  Right Ear: External ear normal.  Left Ear: External ear normal.  Mouth/Throat: Oropharynx is clear and moist.  Right ear with cerumen impaction. Unable to visualize TM. Left ear unremarkable. No TM erythema or loss of landmarks. No ear discharge.   Eyes: Right eye exhibits no discharge. Left eye exhibits no discharge.  Pulmonary/Chest: Effort normal. No respiratory distress.  Neurological: He is alert. Coordination normal.  Skin: Skin is warm and dry. No rash noted. He is not diaphoretic. No erythema. No pallor.  Psychiatric: He has a normal mood and affect. His behavior is normal.  Nursing note and vitals reviewed.    ED Treatments / Results  Labs (all labs ordered are listed, but only abnormal results are displayed) Labs Reviewed - No data to display  EKG  EKG Interpretation None       Radiology No results found.  Procedures .Ear Cerumen Removal Date/Time: 03/12/2016 5:52 AM Performed by: Everlene Farrier Authorized by: Everlene Farrier   Consent:    Consent obtained:  Verbal   Consent given by:  Patient   Risks discussed:  Infection, pain, TM perforation and incomplete removal   Alternatives discussed:  Alternative treatment Procedure details:      Location:  R ear   Procedure type: curette   Post-procedure details:    Hearing quality:  Normal   Patient tolerance of procedure:  Tolerated well, no immediate complications Comments:     TM only partially visualized after removal and appears intact and pearly gray.    (including critical care time)  Medications Ordered in ED Medications - No data to display   Initial Impression / Assessment and Plan / ED Course  I have reviewed the triage vital signs and the nursing notes.  Pertinent labs & imaging results that were available during my care of the patient were reviewed by me and considered in my medical  decision making (see chart for details).  Clinical Course   This is a 30 y.o. Male who presents to the ED complaining of right ear pain since this evening. He reports he was cleaning out his right ear with a q-tip when he had ear pain and felt like his hearing was muffled.  On exam the patient is afebrile nontoxic appearing. She has right ear cerumen impaction. No ear discharge. I did a cerumen removal with a curette. I removed a large amount of cerumen. Patient reports his hearing is back to normal. I was still only able to visualize his TM partially. Did is pearly-gray and appears intact. Patient requested me to stop due to discomfort. Hearing intact. Will discharge with debrox drops and have him follow up with PCP. I advised the patient to follow-up with their primary care provider this week. I advised the patient to return to the emergency department with new or worsening symptoms or new concerns. The patient verbalized understanding and agreement with plan.    Final Clinical Impressions(s) / ED Diagnoses   Final diagnoses:  Impacted cerumen of right ear    New Prescriptions New Prescriptions   CARBAMIDE PEROXIDE (DEBROX) 6.5 % OTIC SOLUTION    Place 5 drops into the right ear 2 (two) times daily.     Everlene FarrierWilliam Jahnasia Tatum, PA-C 03/12/16 [id number]    Layla MawKristen N Ward, DO 03/12/16  [id number]

## 2016-03-12 NOTE — ED Triage Notes (Signed)
Pt. reports right ear ache onset yesterday , denies injury , no drainage or hearing loss.

## 2016-03-13 ENCOUNTER — Ambulatory Visit: Payer: Medicaid Other | Admitting: Rehabilitation

## 2016-03-17 ENCOUNTER — Other Ambulatory Visit: Payer: Self-pay

## 2016-03-17 MED ORDER — DIMETHYL FUMARATE 240 MG PO CPDR: 240.0000 mg | DELAYED_RELEASE_CAPSULE | Freq: Two times a day (BID) | ORAL | 11 refills | Status: DC

## 2016-03-25 ENCOUNTER — Telehealth: Payer: Self-pay | Admitting: Neurology

## 2016-03-25 MED ORDER — AMPHETAMINE-DEXTROAMPHET ER 20 MG PO CP24: 20.0000 mg | ORAL_CAPSULE | Freq: Every day | ORAL | 0 refills | Status: DC

## 2016-03-25 NOTE — Telephone Encounter (Signed)
Adderall rx. up front [facility]/fim 

## 2016-03-25 NOTE — Telephone Encounter (Signed)
Rx. awaiting [facility] sig/fim 

## 2016-03-25 NOTE — Telephone Encounter (Signed)
Patient requesting refill of amphetamine-dextroamphetamine (ADDERALL XR) 20 MG 24 hr capsule. I advised the Rx will be ready in 24 hours unless the nurse advises otherwise. ° ° °

## 2016-03-25 NOTE — Addendum Note (Signed)
Addended by: Sheikh Leverich I on: 03/25/2016 04:08 PM   Modules accepted: Orders  

## 2016-03-25 NOTE — Addendum Note (Signed)
Addended by: Candis Schatz I on: 03/25/2016 04:08 PM   Modules accepted: Orders

## 2016-04-13 ENCOUNTER — Ambulatory Visit: Payer: No Typology Code available for payment source | Attending: Neurology | Admitting: Rehabilitation

## 2016-04-28 ENCOUNTER — Telehealth: Payer: Self-pay | Admitting: *Deleted

## 2016-04-28 MED ORDER — AMPHETAMINE-DEXTROAMPHET ER 20 MG PO CP24: 20.0000 mg | ORAL_CAPSULE | Freq: Every day | ORAL | 0 refills | Status: DC

## 2016-04-28 NOTE — Telephone Encounter (Signed)
Adderall rx. up front [facility]/fim 

## 2016-04-28 NOTE — Telephone Encounter (Signed)
Patient called requesting refill of Adderral , sent to [facility] on [address]. He then confirmed his follow up with Dr Epimenio Foot.

## 2016-04-28 NOTE — Addendum Note (Signed)
Addended by: Candis Schatz I on: 04/28/2016 10:47 AM   Modules accepted: Orders

## 2016-04-28 NOTE — Telephone Encounter (Signed)
Rx. awaiting RAS sig.  I have spoken with pt. and explained Adderall rx. must be picked up in the office/fim

## 2016-05-07 ENCOUNTER — Ambulatory Visit: Payer: No Typology Code available for payment source | Admitting: Rehabilitation

## 2016-05-28 ENCOUNTER — Telehealth: Payer: Self-pay | Admitting: Neurology

## 2016-05-28 MED ORDER — AMPHETAMINE-DEXTROAMPHET ER 20 MG PO CP24: 20.0000 mg | ORAL_CAPSULE | Freq: Every day | ORAL | 0 refills | Status: DC

## 2016-05-28 NOTE — Telephone Encounter (Signed)
Rx. awaiting [facility] sig/fim 

## 2016-05-28 NOTE — Telephone Encounter (Signed)
Adderall rx. up front [facility]/fim 

## 2016-05-28 NOTE — Telephone Encounter (Signed)
Pt request refill for amphetamine-dextroamphetamine (ADDERALL XR) 20 MG 24 hr capsule . Pt is aware the office closes at noon tomorrow

## 2016-06-17 ENCOUNTER — Ambulatory Visit (INDEPENDENT_AMBULATORY_CARE_PROVIDER_SITE_OTHER): Payer: Medicaid Other | Admitting: Neurology

## 2016-06-17 ENCOUNTER — Encounter: Payer: Self-pay | Admitting: Neurology

## 2016-06-17 VITALS — BP 114/72 | HR 64 | Resp 16 | Ht 67.0 in | Wt 150.5 lb

## 2016-06-17 DIAGNOSIS — R252 Cramp and spasm: Secondary | ICD-10-CM

## 2016-06-17 DIAGNOSIS — G35 Multiple sclerosis: Secondary | ICD-10-CM

## 2016-06-17 DIAGNOSIS — R4189 Other symptoms and signs involving cognitive functions and awareness: Secondary | ICD-10-CM

## 2016-06-17 DIAGNOSIS — N521 Erectile dysfunction due to diseases classified elsewhere: Secondary | ICD-10-CM

## 2016-06-17 DIAGNOSIS — F418 Other specified anxiety disorders: Secondary | ICD-10-CM

## 2016-06-17 DIAGNOSIS — R269 Unspecified abnormalities of gait and mobility: Secondary | ICD-10-CM

## 2016-06-17 MED ORDER — BACLOFEN 10 MG PO TABS: ORAL_TABLET | ORAL | 11 refills | Status: DC

## 2016-06-17 MED ORDER — LEVETIRACETAM 1000 MG PO TABS: ORAL_TABLET | ORAL | 11 refills | Status: DC

## 2016-06-17 MED ORDER — GABAPENTIN 300 MG PO CAPS: ORAL_CAPSULE | ORAL | 11 refills | Status: DC

## 2016-06-17 MED ORDER — AMPHETAMINE-DEXTROAMPHET ER 20 MG PO CP24
20.0000 mg | ORAL_CAPSULE | Freq: Every day | ORAL | 0 refills | Status: DC
Start: 2016-06-17 — End: 2016-07-28

## 2016-06-17 NOTE — Progress Notes (Signed)
[facility]  PATIENT: Mike Lowery DOB: Mar 27, 1986  REFERRING DOCTOR OR PCP:  Dr. Hyman Hopes SOURCE: Patient, family, records in the EMR, MRI images on PACS  _________________________________   HISTORICAL  CHIEF COMPLAINT:  Chief Complaint  Patient presents with  . Multiple Sclerosis    Sts. he continues to tolerate Tecfidera well.  Sts. he has some more right sided spasticity, worse with prolonged sitting./fim    HISTORY OF PRESENT ILLNESS:  Mike Lowery,is a 31 yo man with multiple sclerosis.     MS:   He is been on Tecfidera since January 2017. He tolerates it well. He has not had any significant problem with GI issues or flushing. There had been no new exacerbations.  His main problem continues to be spasticity on the right side and cognitive issues.  Spasms/spells:    He still has some tonic spasticity but no phasic spasms involving the right sided face/arm spells since starting levetiracetam.   In the past, EEG did not show any epileptiform activity.    He never has LOC.   Currently he is on baclofen 4 x 10 mg daily for the tonic spasms.     Gait/strength/sensation: Gait is mildly off balanced and he stumbles but much better than last year.   He no longer needs a cane and can now tandem walk.   He has mild weakness and spasticity now only on his right.   He also notes painful dysesthesias involving the legs and left > right arms..   This has also improved over the past few months.   Gabapentin 3 x 300 mg daily helps  Baclofen is helping the tonic spasticity.    .    Bladder:    He now has only mild urinary frequency and urgency.     No incontinence.   He has ED also better  Fatigue/sleep:    He has a lot of fatigue, mostly cognitive, helped by Adderall.   He is less sleepy during the day.   He denies insomnia.    Mood:  His depression is doing better and he stopped Cymbalta  Cognition:   He is doing better with improved attention and focus. He still has  some word finding difficulties but these are not as bad as they were last year.  Adderall helps his cognitive skills a little bit and helps his fatigue and sleepiness a lot.  Tolerates the current dose well.  MS HISTORY:  In mid 2015, he had the onset of weakness and numbness in the arms and legs, a little worse on the left.   When symptoms persisted, he presented to [facility] emergency room. MRI was consistent with multiple sclerosis and he was admitted. The MRI of the brain showed several plaques, mostly in the periventricular white matter. The MRI of the cervical spine showed several large T2 hyperintense foci, with some enhancement. He was given IV Solu-Medrol and his symptoms improved quite a bit. He followed up with Dr. Everlena Cooper and was placed on Plegridy about 8 or 9 months after starting Plegridy, he had another exacerbation and MRI of the brain and cervical spine were performed again 02/21/2015. The MRI of the cervical spine does not show any definite new lesions. However, the MRI of the brain does show several new lesions including for small enhancing foci. He was switched to [facility] November or December 2016.   I have personally reviewed the MRIs of the brain and cervical spine from October 2016. The MRI  of the brain shows multiple T2/FLAIR hyperintense foci consistent with MS in the hemispheres and brainstem.   4 of the hemispheric small foci enhanced after gadolinium administration. The MRI of the cervical spine shows multiple hyperintense foci. There was a normal enhancement pattern.  REVIEW OF SYSTEMS: Constitutional: No fevers, chills, sweats, or change in appetite.   He has fatigue and sleepiness. Eyes: No visual changes, double vision, eye pain Ear, nose and throat: No hearing loss, ear pain, nasal congestion, sore throat Cardiovascular: No chest pain, palpitations Respiratory: No shortness of breath at rest or with exertion.   No wheezes GastrointestinaI: No nausea, vomiting, diarrhea,  abdominal pain, fecal incontinence Genitourinary: He notes urinary frequency and ED.    Musculoskeletal: No neck pain, back pain Integumentary: No rash, pruritus, skin lesions Neurological: as above Psychiatric: See above Endocrine: No palpitations, diaphoresis, change in appetite, change in weigh or increased thirst Hematologic/Lymphatic: No anemia, purpura, petechiae. Allergic/Immunologic: No itchy/runny eyes, nasal congestion, recent allergic reactions, rashes  ALLERGIES: No Known Allergies  HOME MEDICATIONS:  Current Outpatient Prescriptions:  .  amphetamine-dextroamphetamine (ADDERALL XR) 20 MG 24 hr capsule, Take 1 capsule (20 mg total) by mouth daily., Disp: 30 capsule, Rfl: 0 .  baclofen (LIORESAL) 10 MG tablet, Take one in the morning, one in the evening and two at bedtime for spasticity (Patient taking differently: Take 10-20 mg by mouth 3 (three) times daily. Take one in the morning, one in the evening and two at bedtime for spasticity), Disp: 120 each, Rfl: 11 .  carbamide peroxide (DEBROX) 6.5 % otic solution, Place 5 drops into the right ear 2 (two) times daily., Disp: 15 mL, Rfl: 0 .  Dimethyl Fumarate (TECFIDERA) 240 MG CPDR, Take 1 capsule (240 mg total) by mouth 2 (two) times daily., Disp: 60 capsule, Rfl: 11 .  DULoxetine (CYMBALTA) 60 MG capsule, Take 1 capsule (60 mg total) by mouth daily., Disp: 30 capsule, Rfl: 5 .  gabapentin (NEURONTIN) 300 MG capsule, Take one in the morning, one in the evening and two at night for nerve pain (Patient taking differently: Take 300-600 mg by mouth 3 (three) times daily. Take one in the morning, one in the evening and two at night for nerve pain), Disp: 120 capsule, Rfl: 11 .  levETIRAcetam (KEPPRA) 1000 MG tablet, Take 1 tablet in the morning and 1 tablet at night./fim, Disp: 60 tablet, Rfl: 3 .  naproxen (NAPROSYN) 500 MG tablet, Take 1 tablet (500 mg total) by mouth 2 (two) times daily., Disp: 30 tablet, Rfl: 0 .  penicillin v  potassium (VEETID) 500 MG tablet, Take 1 tablet (500 mg total) by mouth 4 (four) times daily., Disp: 40 tablet, Rfl: 0 .  predniSONE (DELTASONE) 10 MG tablet, Take 6 tablets on day 1 Take 5 tablets on day 2 Take 4 tablets on day 3 Take 3 tablets on day 4 Take 2 tablets on day 5 Take 1 tablet on day 6, Disp: 21 tablet, Rfl: 0  PAST MEDICAL HISTORY: Past Medical History:  Diagnosis Date  . Multiple sclerosis (HCC) 12/24/13  . Multiple sclerosis (HCC)   . Vision abnormalities     PAST SURGICAL HISTORY: Past Surgical History:  Procedure Laterality Date  . NO PAST SURGERIES      FAMILY HISTORY: Family History  Problem Relation Age of Onset  . Healthy Mother   . Healthy Father   . Cancer Maternal Grandmother     unknown   . Ataxia Sister   . Multiple sclerosis  Neg Hx     SOCIAL HISTORY:  Social History   Social History  . Marital status: Single    Spouse name: N/A  . Number of children: N/A  . Years of education: N/A   Occupational History  . Not on file.   Social History Main Topics  . Smoking status: Former Smoker    Types: Cigarettes    Quit date: 04/18/2013  . Smokeless tobacco: Former Neurosurgeon    Quit date: 09/08/2014  . Alcohol use Yes  . Drug use: No     Comment: former  . Sexual activity: Yes    Partners: Female   Other Topics Concern  . Not on file   Social History Narrative  . No narrative on file     PHYSICAL EXAM  Vitals:   06/17/16 1332  BP: 114/72  Pulse: 64  Resp: 16  Weight: 150 lb 8 oz (68.3 kg)  Height: 5\' 7"  (1.702 m)    Body mass index is 23.57 kg/m.   General: The patient is well-developed and well-nourished and in no acute distress   Skin: Extremities are without significant rash or edema.   Neurologic Exam  Mental status: The patient is alert and oriented x 3 at the time of the examination. The patient has apparent normal recent and remote memory, with a reduced attention span and concentration ability.   Speech is  normal.  Cranial nerves: Extraocular movements are full.   There is good facial sensation to soft touch bilaterally.Facial strength is normal.  Trapezius and sternocleidomastoid strength is normal. No dysarthria is noted.  The tongue is midline, and the patient has symmetric elevation of the soft palate. No obvious hearing deficits are noted.  Motor:   Muscle bulk is normal.   Tone is increased in legs, , R=L. Strength is  5 / 5 in all 4 extremities.   Sensory: Sensory testing shows reduced touch in the right arm and left leg and reduced vibration sensation in the left arm and left leg.  Coordination: Cerebellar testing reveals good finger-nose-finger and poor heel-to-shin bilaterally.  Gait and station: Station is normal.   Gait is mildly  wide and mildly spastict. Tandem gait is now just mildly wide. Romberg is positive.   Reflexes: Deep tendon reflexes are symmetric and 3 in arms.  In legs, they are increased bilaterally.   He has spread at the left knee.,  No ankle clonus.        DIAGNOSTIC DATA (LABS, IMAGING, TESTING) - I reviewed patient records, labs, notes, testing and imaging myself where available.  Lab Results  Component Value Date   WBC 4.7 01/29/2016   HGB 14.2 07/20/2015   HCT 38.6 01/29/2016   MCV 83 01/29/2016   PLT 274 01/29/2016      Component Value Date/Time   NA 142 07/20/2015 0949   K 4.1 07/20/2015 0949   CL 106 07/20/2015 0949   CO2 28 07/20/2015 0949   GLUCOSE 92 07/20/2015 0949   BUN 11 07/20/2015 0949   CREATININE 0.96 07/20/2015 0949   CREATININE 0.92 03/27/2015 1130   CALCIUM 9.1 07/20/2015 0949   PROT 6.8 03/27/2015 1216   ALBUMIN 4.3 03/27/2015 1216   AST 18 03/27/2015 1216   ALT 33 03/27/2015 1216   ALKPHOS 63 03/27/2015 1216   BILITOT 0.3 03/27/2015 1216   GFRNONAA >60 07/20/2015 0949   GFRNONAA >89 10/10/2014 1251   GFRAA >60 07/20/2015 0949   GFRAA >89 10/10/2014 1251  ASSESSMENT AND PLAN  Multiple sclerosis (HCC) - Plan:  gabapentin (NEURONTIN) 300 MG capsule, CBC with Differential/Platelet, MR BRAIN W WO CONTRAST  Gait disturbance - Plan: MR BRAIN W WO CONTRAST  Disturbed cognition  Erectile dysfunction due to diseases classified elsewhere  Depression with anxiety  Spasticity     1.   He will continue Tecfidera for MS.   Check CBC.   We need to check MRI of the brain to assess for the possibility of subclinical progression. If present, I would want him to switch from Tecfidera to one of the more efficacious intravenous medications. 2.    Continue baclofen and gabapentin for symptoms. 3.   Renew Adderall for MS related reduced attention and sleepiness.  4.   Mr. Pieroni is disabled from work due to combination of cognitive and physical impairments. He has stabilized on the current medications, but he is not expected to improve to a point that he will be able to return to work.  5.   Return to see me in 6 months or sooner if there are new or worsening symptoms.   Viggo Perko A. Epimenio Foot, MD, PhD 06/17/2016, 1:46 PM Certified in Neurology, Clinical Neurophysiology, Sleep Medicine, Pain Medicine and Neuroimaging  [facility] [address], [address] [place], [place] [address] [phone]

## 2016-06-18 LAB — CBC WITH DIFFERENTIAL/PLATELET
BASOS: 1 %
Basophils Absolute: 0 10*3/uL (ref 0.0–0.2)
EOS (ABSOLUTE): 0.1 10*3/uL (ref 0.0–0.4)
EOS: 2 %
HEMATOCRIT: 39.7 % (ref 37.5–51.0)
HEMOGLOBIN: 13.9 g/dL (ref 13.0–17.7)
IMMATURE GRANS (ABS): 0 10*3/uL (ref 0.0–0.1)
IMMATURE GRANULOCYTES: 0 %
LYMPHS: 57 %
Lymphocytes Absolute: 2.8 10*3/uL (ref 0.7–3.1)
MCH: 28.7 pg (ref 26.6–33.0)
MCHC: 35 g/dL (ref 31.5–35.7)
MCV: 82 fL (ref 79–97)
MONOCYTES: 8 %
Monocytes Absolute: 0.4 10*3/uL (ref 0.1–0.9)
NEUTROS ABS: 1.6 10*3/uL (ref 1.4–7.0)
NEUTROS PCT: 32 %
PLATELETS: 261 10*3/uL (ref 150–379)
RBC: 4.85 x10E6/uL (ref 4.14–5.80)
RDW: 13 % (ref 12.3–15.4)
WBC: 4.9 10*3/uL (ref 3.4–10.8)

## 2016-07-01 ENCOUNTER — Other Ambulatory Visit: Payer: Self-pay | Admitting: Neurology

## 2016-07-01 DIAGNOSIS — G35 Multiple sclerosis: Secondary | ICD-10-CM

## 2016-07-02 ENCOUNTER — Other Ambulatory Visit: Payer: Medicaid Other

## 2016-07-16 ENCOUNTER — Emergency Department (HOSPITAL_COMMUNITY): Payer: Medicaid Other

## 2016-07-16 ENCOUNTER — Telehealth: Payer: Self-pay | Admitting: Neurology

## 2016-07-16 ENCOUNTER — Encounter (HOSPITAL_COMMUNITY): Payer: Self-pay | Admitting: Emergency Medicine

## 2016-07-16 ENCOUNTER — Emergency Department (HOSPITAL_COMMUNITY)
Admission: EM | Admit: 2016-07-16 | Discharge: 2016-07-16 | Disposition: A | Discharge status: Home / Self Care | Payer: Medicaid Other | Attending: Emergency Medicine | Admitting: Emergency Medicine

## 2016-07-16 DIAGNOSIS — G35 Multiple sclerosis: Secondary | ICD-10-CM | POA: Diagnosis not present

## 2016-07-16 DIAGNOSIS — Z79899 Other long term (current) drug therapy: Secondary | ICD-10-CM | POA: Insufficient documentation

## 2016-07-16 DIAGNOSIS — Z87891 Personal history of nicotine dependence: Secondary | ICD-10-CM | POA: Insufficient documentation

## 2016-07-16 DIAGNOSIS — R202 Paresthesia of skin: Secondary | ICD-10-CM | POA: Diagnosis present

## 2016-07-16 LAB — BASIC METABOLIC PANEL
Anion gap: 6 (ref 5–15)
BUN: 12 mg/dL (ref 6–20)
CO2: 27 mmol/L (ref 22–32)
Calcium: 9.3 mg/dL (ref 8.9–10.3)
Chloride: 106 mmol/L (ref 101–111)
Creatinine, Ser: 0.81 mg/dL (ref 0.61–1.24)
GFR calc Af Amer: >60 mL/min (ref >60)
GFR calc non Af Amer: >60 mL/min (ref >60)
Glucose, Bld: 117 mg/dL — ABNORMAL HIGH (ref 65–99)
Potassium: 4.1 mmol/L (ref 3.5–5.1)
Sodium: 139 mmol/L (ref 135–145)

## 2016-07-16 LAB — CBG MONITORING, ED: GLUCOSE-CAPILLARY: 133 mg/dL — AB (ref 65–99)

## 2016-07-16 LAB — CBC WITH DIFFERENTIAL/PLATELET
Basophils Absolute: 0 10*3/uL (ref 0.0–0.1)
Basophils Relative: 0 %
Eosinophils Absolute: 0.1 10*3/uL (ref 0.0–0.7)
Eosinophils Relative: 3 %
HCT: 41.8 % (ref 39.0–52.0)
Hemoglobin: 14.3 g/dL (ref 13.0–17.0)
Lymphocytes Relative: 46 %
Lymphs Abs: 2.1 10*3/uL (ref 0.7–4.0)
MCH: 28.7 pg (ref 26.0–34.0)
MCHC: 34.2 g/dL (ref 30.0–36.0)
MCV: 83.9 fL (ref 78.0–100.0)
Monocytes Absolute: 0.4 10*3/uL (ref 0.1–1.0)
Monocytes Relative: 9 %
Neutro Abs: 1.9 10*3/uL (ref 1.7–7.7)
Neutrophils Relative %: 42 %
Platelets: 276 10*3/uL (ref 150–400)
RBC: 4.98 MIL/uL (ref 4.22–5.81)
RDW: 13 % (ref 11.5–15.5)
WBC: 4.6 10*3/uL (ref 4.0–10.5)

## 2016-07-16 MED ORDER — SODIUM CHLORIDE 0.9 % IV SOLN
1000.0000 mg | Freq: Once | INTRAVENOUS | Status: AC
  Administered 2016-07-16: 1000 mg via INTRAVENOUS
  Filled 2016-07-16: qty 8

## 2016-07-16 MED ORDER — GADOBENATE DIMEGLUMINE 529 MG/ML IV SOLN
15.0000 mL | Freq: Once | INTRAVENOUS | Status: AC | PRN
  Administered 2016-07-16: 14 mL via INTRAVENOUS

## 2016-07-16 NOTE — ED Provider Notes (Signed)
[facility]-EMERGENCY DEPT Provider Note   CSN: [id number] Arrival date & time: 07/16/16  1225   History   Chief Complaint Chief Complaint  Patient presents with  . Foot Pain  . Numbness    HPI Mike Lowery is a 31 y.o. male.  HPI   31 year old male presents today with complaints of paresthesia.  Patient notes significant past medical history of MS.  He notes he is followed by Dr. Epimenio Foot at [facility].  He notes regular follow-ups, most recently 1 month ago.  Patient notes that he is taking Tecfidera, baclofen, gabapentin, and Adderall for his MS.  He notes approximately 2 weeks ago he started to experience decreased sensation in his feet and distal bilateral lower extremities.  He notes additional decreased sensation in the fingertips of his bilateral hands.  Patient has a history of the same with MS flares, started taking prednisone with a taper from 60 mg.  He notes redness and did not improve symptoms as they have continue to persist.  Patient also notes minor pain in his left knee and left elbow.  He reports this is identical to previous MS flares.  Patient denies any infectious etiology, denies any decreased strength.  Patient reports minor generalized headache.  Denies any other neurological deficits; denies any difficulty ambulating.  She reports taking medications as directed.  Patient was supposed to have an MRI last week, but due to insurance related issues was unable to get this done.   Past Medical History:  Diagnosis Date  . Multiple sclerosis (HCC) 12/24/13  . Multiple sclerosis (HCC)   . Vision abnormalities     Patient Active Problem List   Diagnosis Date Noted  . Spasticity 06/17/2016  . Spells 07/22/2015  . Depression with anxiety 07/02/2015  . Other fatigue 05/01/2015  . Numbness 05/01/2015  . Gait disturbance 05/01/2015  . Disturbed cognition 05/01/2015  . Erectile dysfunction 05/01/2015  . Multiple sclerosis exacerbation (HCC) 03/11/2015  .  Tobacco abuse 03/06/2015  . Nausea without vomiting 11/08/2014  . 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 Relapsing remitting multiple sclerosis (HCC) 10/01/2014  . Swelling of both hands 05/14/2014  . Multiple sclerosis (HCC) 12/24/2013    Past Surgical History:  Procedure Laterality Date  . NO PAST SURGERIES       Home Medications    Prior to Admission medications   Medication Sig Start Date End Date Taking? Authorizing Provider  amphetamine-dextroamphetamine (ADDERALL XR) 20 MG 24 hr capsule Take 1 capsule (20 mg total) by mouth daily. 06/17/16  Yes Asa Lente, MD  baclofen (LIORESAL) 10 MG tablet TAKE 1 TABLET EVERY MORNING, 1 IN THE EVENING, AND 2 AT BEDTIME FOR SPASTICITY 07/01/16  Yes Asa Lente, MD  Dimethyl Fumarate (TECFIDERA) 240 MG CPDR Take 1 capsule (240 mg total) by mouth 2 (two) times daily. 03/17/16  Yes Adam Mliss Fritz, DO  DULoxetine (CYMBALTA) 60 MG capsule Take 1 capsule (60 mg total) by mouth daily. 05/01/15  Yes Richard Aida Puffer, MD  gabapentin (NEURONTIN) 300 MG capsule take 1 capsule by mouth EVERY MORNING, 1 EVERY EVENING AND 2 AT NIGHT FOR NERVE PAIN 07/01/16  Yes Asa Lente, MD  ibuprofen (ADVIL,MOTRIN) 200 MG tablet Take 400 mg by mouth every 6 (six) hours as needed.   Yes Historical Provider, MD  levETIRAcetam (KEPPRA) 1000 MG tablet Take 1 tablet in the morning and 1 tablet at night./fim 06/17/16  Yes Asa Lente, MD  carbamide peroxide [facility])  6.5 % otic solution Place 5 drops into the right ear 2 (two) times daily. Patient not taking: Reported on 07/16/2016 03/12/16   Everlene Farrier, PA-C  naproxen (NAPROSYN) 500 MG tablet Take 1 tablet (500 mg total) by mouth 2 (two) times daily. Patient not taking: Reported on 07/16/2016 01/11/16   Urban Gibson, MD  penicillin v potassium (VEETID) 500 MG tablet Take 1 tablet (500 mg total) by mouth 4 (four) times daily. Patient not taking: Reported on 07/16/2016 10/04/15   Garlon Hatchet, PA-C     Family History Family History  Problem Relation Age of Onset  . Healthy Mother   . Healthy Father   . Cancer Maternal Grandmother     unknown   . Ataxia Sister   . Multiple sclerosis Neg Hx     Social History Social History  Substance Use Topics  . Smoking status: Former Smoker    Types: Cigarettes    Quit date: 04/18/2013  . Smokeless tobacco: Former Neurosurgeon    Quit date: 09/08/2014  . Alcohol use Yes     Allergies   Patient has no known allergies.   Review of Systems Review of Systems  All other systems reviewed and are negative.   Physical Exam Updated Vital Signs BP 113/70 (BP Location: Right Arm)   Pulse 83   Temp 98 F (36.7 C) (Oral)   Resp 20   SpO2 96%   Physical Exam  Constitutional: He is oriented to person, place, and time. He appears well-developed and well-nourished.  HENT:  Head: Normocephalic and atraumatic.  Eyes: Conjunctivae are normal. Pupils are equal, round, and reactive to light. Right eye exhibits no discharge. Left eye exhibits no discharge. No scleral icterus.  Neck: Normal range of motion. No JVD present. No tracheal deviation present.  Pulmonary/Chest: Effort normal. No stridor.  Musculoskeletal: Normal range of motion. He exhibits no edema or tenderness.  Neurological: He is alert and oriented to person, place, and time. A sensory deficit is present. No cranial nerve deficit. Coordination normal. GCS eye subscore is 4. GCS verbal subscore is 5. GCS motor subscore is 6.  Decreased sensation to bilateral lower extremities from knee down diffusely  Decreased sensation to bilateral fingers sparing hands  Strength intact diffusely   Psychiatric: He has a normal mood and affect. His behavior is normal. Judgment and thought content normal.  Nursing note and vitals reviewed.  ED Treatments / Results  Labs (all labs ordered are listed, but only abnormal results are displayed) Labs Reviewed  BASIC METABOLIC PANEL - Abnormal; Notable  for the following:       Result Value   Glucose, Bld 117 (*)    All other components within normal limits  CBG MONITORING, ED - Abnormal; Notable for the following:    Glucose-Capillary 133 (*)    All other components within normal limits  CBC WITH DIFFERENTIAL/PLATELET    EKG  EKG Interpretation None       Radiology Mr Laqueta Jean And Wo Contrast  Result Date: 07/16/2016 CLINICAL DATA:  31 y/o M; bilateral foot pain, bilateral foot and hand numbness, and weakness for 1 week. History of multiple sclerosis. EXAM: MRI HEAD WITHOUT AND WITH CONTRAST MRI CERVICAL SPINE WITHOUT AND WITH CONTRAST TECHNIQUE: Multiplanar, multiecho pulse sequences of the brain and surrounding structures, and cervical spine, to include the craniocervical junction and cervicothoracic junction, were obtained without and with intravenous contrast. CONTRAST:  14mL MULTIHANCE GADOBENATE DIMEGLUMINE 529 MG/ML IV SOLN COMPARISON:  MRI of  the brain and cervical spine dated 02/21/2015. FINDINGS: MRI HEAD FINDINGS Brain: There are numerous T2 FLAIR hyperintense lesions in periventricular white matter with a radial configuration, throughout the corpus callosum in greatest concentration in the anterior body and genu, multiple foci in subcortical and juxta cortical white matter, foci within bilateral cerebellar hemispheres, and a focus within the left posterior limb of internal capsule. Additionally on axial T2 weighted sequences lesions can be seen within the right posterior medulla, right anterior medulla, cervicomedullary junction, and left superior cerebellar peduncle. Between 10-15 lesions are new in comparison with the prior MRI of the brain for example a large left parietal periventricular lesion and the lesion within the left posterior limb of internal capsule. The lesion within left posterior limb of internal capsule and a subcortical lesion within the left parietal lobe demonstrate diffusion restriction (series 4, image 29 and  series 5, image 16). After administration of intravenous contrast there is ring enhancement of the new large left parietal periventricular lesion (series 21, image 34) and there is enhancement of a new small lesion within the left genu of corpus callosum (series 21, image 31). There is no evidence for acute infarction, acute intracranial hemorrhage, or significant mass effect. Vascular: Normal flow voids. Skull and upper cervical spine: Normal marrow signal. Sinuses/Orbits: Left maxillary sinus mucous retention cyst. Otherwise orbits and paranasal sinuses are normal. Other: None. MRI CERVICAL SPINE FINDINGS Alignment: Straightening of cervical lordosis. Vertebrae: No fracture, evidence of discitis, or bone lesion. Cord: There are numerous T2 hyperintense cord lesions within the central and left cord extending from C2 to C3-4. Within central, right common posterior cord from C3-4 to C5. An additional lesion within the left cord from C6 -T1. In comparison with the prior MRI cord signal within the central and posterior cord at the C3-4 level is new. No abnormal cord enhancement. Posterior Fossa, vertebral arteries, paraspinal tissues: As above. Disc levels: No significant disc displacement, foraminal narrowing, or canal stenosis. IMPRESSION: 1. Numerous white matter lesions in the supra and infratentorial brain and cervical spinal cord are again seen compatible with demyelination and history of multiple sclerosis. No brain parenchymal volume loss. 2. There are 10-15 new white matter lesions from 02/21/2015 MRI of the head, more prevalent in the left hemisphere. 3. Enhanced of lesions within the left genu of corpus callosum and the left parietal periventricular white matter as well as diffusion restriction in the left parietal subcortical white matter and the left posterior limb of internal capsule indicate active inflammation. 4. New cervical cord lesion at the C3-4 level. No abnormal enhancement to suggest active  inflammation. Electronically Signed   By: Mitzi Hansen M.D.   On: 07/16/2016 19:55   Mr Cervical Spine W Or Wo Contrast  Result Date: 07/16/2016 CLINICAL DATA:  31 y/o M; bilateral foot pain, bilateral foot and hand numbness, and weakness for 1 week. History of multiple sclerosis. EXAM: MRI HEAD WITHOUT AND WITH CONTRAST MRI CERVICAL SPINE WITHOUT AND WITH CONTRAST TECHNIQUE: Multiplanar, multiecho pulse sequences of the brain and surrounding structures, and cervical spine, to include the craniocervical junction and cervicothoracic junction, were obtained without and with intravenous contrast. CONTRAST:  14mL MULTIHANCE GADOBENATE DIMEGLUMINE 529 MG/ML IV SOLN COMPARISON:  MRI of the brain and cervical spine dated 02/21/2015. FINDINGS: MRI HEAD FINDINGS Brain: There are numerous T2 FLAIR hyperintense lesions in periventricular white matter with a radial configuration, throughout the corpus callosum in greatest concentration in the anterior body and genu, multiple foci in subcortical and juxta  cortical white matter, foci within bilateral cerebellar hemispheres, and a focus within the left posterior limb of internal capsule. Additionally on axial T2 weighted sequences lesions can be seen within the right posterior medulla, right anterior medulla, cervicomedullary junction, and left superior cerebellar peduncle. Between 10-15 lesions are new in comparison with the prior MRI of the brain for example a large left parietal periventricular lesion and the lesion within the left posterior limb of internal capsule. The lesion within left posterior limb of internal capsule and a subcortical lesion within the left parietal lobe demonstrate diffusion restriction (series 4, image 29 and series 5, image 16). After administration of intravenous contrast there is ring enhancement of the new large left parietal periventricular lesion (series 21, image 34) and there is enhancement of a new small lesion within the left  genu of corpus callosum (series 21, image 31). There is no evidence for acute infarction, acute intracranial hemorrhage, or significant mass effect. Vascular: Normal flow voids. Skull and upper cervical spine: Normal marrow signal. Sinuses/Orbits: Left maxillary sinus mucous retention cyst. Otherwise orbits and paranasal sinuses are normal. Other: None. MRI CERVICAL SPINE FINDINGS Alignment: Straightening of cervical lordosis. Vertebrae: No fracture, evidence of discitis, or bone lesion. Cord: There are numerous T2 hyperintense cord lesions within the central and left cord extending from C2 to C3-4. Within central, right common posterior cord from C3-4 to C5. An additional lesion within the left cord from C6 -T1. In comparison with the prior MRI cord signal within the central and posterior cord at the C3-4 level is new. No abnormal cord enhancement. Posterior Fossa, vertebral arteries, paraspinal tissues: As above. Disc levels: No significant disc displacement, foraminal narrowing, or canal stenosis. IMPRESSION: 1. Numerous white matter lesions in the supra and infratentorial brain and cervical spinal cord are again seen compatible with demyelination and history of multiple sclerosis. No brain parenchymal volume loss. 2. There are 10-15 new white matter lesions from 02/21/2015 MRI of the head, more prevalent in the left hemisphere. 3. Enhanced of lesions within the left genu of corpus callosum and the left parietal periventricular white matter as well as diffusion restriction in the left parietal subcortical white matter and the left posterior limb of internal capsule indicate active inflammation. 4. New cervical cord lesion at the C3-4 level. No abnormal enhancement to suggest active inflammation. Electronically Signed   By: Mitzi Hansen M.D.   On: 07/16/2016 19:55    Procedures Procedures (including critical care time)  Medications Ordered in ED Medications  methylPREDNISolone sodium succinate  (SOLU-MEDROL) 1,000 mg in sodium chloride 0.9 % 50 mL IVPB (1,000 mg Intravenous Given 07/16/16 1539)  gadobenate dimeglumine (MULTIHANCE) injection 15 mL (14 mLs Intravenous Contrast Given 07/16/16 1851)     Initial Impression / Assessment and Plan / ED Course  I have reviewed the triage vital signs and the nursing notes.  Pertinent labs & imaging results that were available during my care of the patient were reviewed by me and considered in my medical decision making (see chart for details).     Final Clinical Impressions(s) / ED Diagnoses   Final diagnoses:  MS (multiple sclerosis) (HCC)    Labs:  CBC, BMP   Imaging: MRI brain with and without, MRI cervical spine with without  Consults: Dr. Epimenio Foot   Therapeutics: Solumedrol   Discharge Meds:   Assessment/Plan: 31 year old male presents today with likely MS flare.  Case was discussed with Dr. Epimenio Foot who agreed that MRI of the head and neck would be  appropriate here with outpatient follow-up.  You recommended 1 g of Solu-Medrol here in the ED, patient will follow up tomorrow morning at 815 for repeat infusions.  Patient agreed that this would be appropriate, and will be discharged home after MRI and infusion here.    MRI results shared with patient who will discuss these with neurologist tomorrow.    New Prescriptions Discharge Medication List as of 07/16/2016  8:10 PM       Eyvonne Mechanic, PA-C 07/16/16 2251    Azalia Bilis, MD 07/17/16 [phone]

## 2016-07-16 NOTE — ED Notes (Signed)
Patient transported to MRI 

## 2016-07-16 NOTE — ED Notes (Signed)
Delay in vitals Pt not in room. 

## 2016-07-16 NOTE — ED Triage Notes (Signed)
Pt c/o bilateral foot pain, bilateral foot and hand numbness, weakness x 1 week. Pt reports symptoms feel identical to his MS exacerbations. No SOB.

## 2016-07-16 NOTE — Telephone Encounter (Signed)
I spoke to Dr. Jeralyn Bennett at the [facility] emergency room. Mike Lowery appears to be having an exacerbation. They will get an MRI of the brain and the cervical spine while he is in the emergency room and given 1 g of IV Solu-Medrol. I recommend that he get an additional 3 days of IV Solu-Medrol as an outpatient and that should be able to be arranged at [facility] infusion center.  Mike Lowery should give [place] a call on Monday and we will try to get him in next week.   Based on symptoms and the MRIs, we might need to switch him from Tecfidera to a more efficacious medications such as Tysabri, Gilenya or [employer]

## 2016-07-16 NOTE — Discharge Instructions (Signed)
Please follow-up tomorrow morning at 815 with Dr. Epimenio Foot at his office for continued Solu-Medrol therapy.  Please return the emergency room immediately if you experience any concerning signs or symptoms.

## 2016-07-17 ENCOUNTER — Encounter: Payer: Self-pay | Admitting: *Deleted

## 2016-07-17 ENCOUNTER — Ambulatory Visit (INDEPENDENT_AMBULATORY_CARE_PROVIDER_SITE_OTHER): Payer: Medicaid Other | Admitting: Neurology

## 2016-07-17 ENCOUNTER — Encounter (INDEPENDENT_AMBULATORY_CARE_PROVIDER_SITE_OTHER): Payer: Self-pay

## 2016-07-17 ENCOUNTER — Encounter: Payer: Self-pay | Admitting: Neurology

## 2016-07-17 ENCOUNTER — Telehealth: Payer: Self-pay | Admitting: *Deleted

## 2016-07-17 ENCOUNTER — Other Ambulatory Visit (HOSPITAL_COMMUNITY): Payer: Self-pay | Admitting: *Deleted

## 2016-07-17 VITALS — BP 127/65 | HR 72 | Resp 14 | Ht 67.0 in | Wt 151.0 lb

## 2016-07-17 DIAGNOSIS — Z79899 Other long term (current) drug therapy: Secondary | ICD-10-CM | POA: Diagnosis not present

## 2016-07-17 DIAGNOSIS — G35 Multiple sclerosis: Secondary | ICD-10-CM

## 2016-07-17 DIAGNOSIS — R5383 Other fatigue: Secondary | ICD-10-CM | POA: Diagnosis not present

## 2016-07-17 DIAGNOSIS — R200 Anesthesia of skin: Secondary | ICD-10-CM | POA: Diagnosis not present

## 2016-07-17 DIAGNOSIS — R269 Unspecified abnormalities of gait and mobility: Secondary | ICD-10-CM | POA: Diagnosis not present

## 2016-07-17 DIAGNOSIS — G35D Multiple sclerosis, unspecified: Secondary | ICD-10-CM

## 2016-07-17 MED ORDER — SODIUM CHLORIDE 0.9 % IV SOLN: INTRAVENOUS | 1 refills | Status: DC

## 2016-07-17 NOTE — Telephone Encounter (Signed)
Pt. to be infused at the hospital.  Rx. escribed to [facility]/fim

## 2016-07-17 NOTE — Progress Notes (Signed)
[facility]  PATIENT: Mike Lowery DOB: May 20, 1985  REFERRING DOCTOR OR PCP:  Dr. Hyman Hopes SOURCE: Patient, family, records in the EMR, MRI images on PACS  _________________________________   HISTORICAL  CHIEF COMPLAINT:  Chief Complaint  Patient presents with  . Multiple Sclerosis    Sts. recent onset of numbness bilat feet up to knees.  Denies missed doses of Tecfidera. Seen in the ER yesterday and received SM 1gm IV.  Sts. sx. are much improved today/fim    HISTORY OF PRESENT ILLNESS:  Mike Lowery,is a 31 yo man with multiple sclerosis.   A few days ago he started having numbness in his legs and it has yesterday started having numbness in his fingertips.   He went to [facility] ED and received one gram IV Solu-Medrol and had MRI's of the brain and cervical spine.   I personally reviewed the scans and they show multiple new lesions in the brain with several lesions enhancing including a larger one in the left periventrivcular white matter.    Also has a new focus adjacent to C4 in the spinal cord.   He has been on Tecfidera since January 2017. He tolerates it well. He has not had any significant problem with GI issues or flushing.   However, due to clear-cut breakthrough disease, he needs to switch to a more efficacious medication and we discussed options in detail.  Spasms/spells:    He continues to have some spasticity. He no longer has the phasic spasms involving the right face and arm.    In the past, EEG did not show any epileptiform activity.    He never has LOC.   Currently he is on baclofen 4 x 10 mg daily for the spasms and tolerates it well.     Gait/strength/sensation: Gait is mildly off balance and he stumbles some. His recent exacerbation, he feels he is walking better than last year though gait was worse this week when he had more numbness.   Mildly better compared to yesterday.Marland Kitchen He has mild weakness and spasticity now only on his right.   He also notes  painful dysesthesias involving the legs and left > right arms..   This has also improved over the past few months.   Gabapentin 3 x 300 mg daily helps  Baclofen is helping the tonic spasticity.    .    Bladder:    He has mild urinary frequency and urgency. There is no incontinence. His ED is better  Fatigue/sleep:    He has a lot of fatigue, mostly cognitive, helped by Adderall.   Adderall  is helping his fatigue as well as focus.Marland Kitchen   He denies insomnia.    Mood:  His depression is doing better and he stopped Cymbalta  Cognition:   He is doing better with improved attention and focus. He still has some word finding difficulties but these are not as bad as they were last year.  Adderall helps his cognitive skills and attention and helps his fatigue and sleepiness a lot.  Tolerates the current dose well.  MS HISTORY:  In mid 2015, he had the onset of weakness and numbness in the arms and legs, a little worse on the left.   When symptoms persisted, he presented to [facility] emergency room. MRI was consistent with multiple sclerosis and he was admitted. The MRI of the brain showed several plaques, mostly in the periventricular white matter. The MRI of the cervical spine showed several large T2  hyperintense foci, with some enhancement. He was given IV Solu-Medrol and his symptoms improved quite a bit. He followed up with Dr. Everlena Cooper and was placed on Plegridy about 8 or 9 months after starting Plegridy, he had another exacerbation and MRI of the brain and cervical spine were performed again 02/21/2015. The MRI of the cervical spine does not show any definite new lesions. However, the MRI of the brain does show several new lesions including for small enhancing foci. He was switched to [facility] November or December 2016.   I have personally reviewed the MRIs of the brain and cervical spine from October 2016. The MRI of the brain shows multiple T2/FLAIR hyperintense foci consistent with MS in the hemispheres and  brainstem.   4 of the hemispheric small foci enhanced after gadolinium administration. The MRI of the cervical spine shows multiple hyperintense foci. There was a normal enhancement pattern.  REVIEW OF SYSTEMS: Constitutional: No fevers, chills, sweats, or change in appetite.   He has fatigue and sleepiness. Eyes: No visual changes, double vision, eye pain Ear, nose and throat: No hearing loss, ear pain, nasal congestion, sore throat Cardiovascular: No chest pain, palpitations Respiratory: No shortness of breath at rest or with exertion.   No wheezes GastrointestinaI: No nausea, vomiting, diarrhea, abdominal pain, fecal incontinence Genitourinary: He notes urinary frequency and ED.    Musculoskeletal: No neck pain, back pain Integumentary: No rash, pruritus, skin lesions Neurological: as above Psychiatric: See above Endocrine: No palpitations, diaphoresis, change in appetite, change in weigh or increased thirst Hematologic/Lymphatic: No anemia, purpura, petechiae. Allergic/Immunologic: No itchy/runny eyes, nasal congestion, recent allergic reactions, rashes  ALLERGIES: No Known Allergies  HOME MEDICATIONS:  Current Outpatient Prescriptions:  .  amphetamine-dextroamphetamine (ADDERALL XR) 20 MG 24 hr capsule, Take 1 capsule (20 mg total) by mouth daily., Disp: 30 capsule, Rfl: 0 .  baclofen (LIORESAL) 10 MG tablet, TAKE 1 TABLET EVERY MORNING, 1 IN THE EVENING, AND 2 AT BEDTIME FOR SPASTICITY, Disp: 120 tablet, Rfl: 10 .  Dimethyl Fumarate (TECFIDERA) 240 MG CPDR, Take 1 capsule (240 mg total) by mouth 2 (two) times daily., Disp: 60 capsule, Rfl: 11 .  DULoxetine (CYMBALTA) 60 MG capsule, Take 1 capsule (60 mg total) by mouth daily., Disp: 30 capsule, Rfl: 5 .  gabapentin (NEURONTIN) 300 MG capsule, take 1 capsule by mouth EVERY MORNING, 1 EVERY EVENING AND 2 AT NIGHT FOR NERVE PAIN, Disp: 120 capsule, Rfl: 10 .  levETIRAcetam (KEPPRA) 1000 MG tablet, Take 1 tablet in the morning and  1 tablet at night./fim, Disp: 60 tablet, Rfl: 11 .  naproxen (NAPROSYN) 500 MG tablet, Take 1 tablet (500 mg total) by mouth 2 (two) times daily., Disp: 30 tablet, Rfl: 0 .  ibuprofen (ADVIL,MOTRIN) 200 MG tablet, Take 400 mg by mouth every 6 (six) hours as needed., Disp: , Rfl:  .  penicillin v potassium (VEETID) 500 MG tablet, Take 1 tablet (500 mg total) by mouth 4 (four) times daily. (Patient not taking: Reported on 07/16/2016), Disp: 40 tablet, Rfl: 0  PAST MEDICAL HISTORY: Past Medical History:  Diagnosis Date  . Multiple sclerosis (HCC) 12/24/13  . Multiple sclerosis (HCC)   . Vision abnormalities     PAST SURGICAL HISTORY: Past Surgical History:  Procedure Laterality Date  . NO PAST SURGERIES      FAMILY HISTORY: Family History  Problem Relation Age of Onset  . Healthy Mother   . Healthy Father   . Cancer Maternal Grandmother     unknown   .  Ataxia Sister   . Multiple sclerosis Neg Hx     SOCIAL HISTORY:  Social History   Social History  . Marital status: Single    Spouse name: N/A  . Number of children: N/A  . Years of education: N/A   Occupational History  . Not on file.   Social History Main Topics  . Smoking status: Former Smoker    Types: Cigarettes    Quit date: 04/18/2013  . Smokeless tobacco: Former Neurosurgeon    Quit date: 09/08/2014  . Alcohol use Yes  . Drug use: No     Comment: former  . Sexual activity: Yes    Partners: Female   Other Topics Concern  . Not on file   Social History Narrative  . No narrative on file     PHYSICAL EXAM  Vitals:   07/17/16 0908  BP: 127/65  Pulse: 72  Resp: 14  Weight: 151 lb (68.5 kg)  Height: 5\' 7"  (1.702 m)    Body mass index is 23.65 kg/m.   General: The patient is well-developed and well-nourished and in no acute distress   Skin: Extremities are without significant rash or edema.   Neurologic Exam  Mental status: The patient is alert and oriented x 3 at the time of the examination. The  patient has apparent normal recent and remote memory, with a reduced attention span and concentration ability.   Speech is normal.  Cranial nerves: Extraocular movements are full.   There is good facial sensation to soft touch bilaterally.Facial strength is normal.  Trapezius and sternocleidomastoid strength is normal. No dysarthria is noted.  The tongue is midline, and the patient has symmetric elevation of the soft palate. No obvious hearing deficits are noted.  Motor:   Muscle bulk is normal.   Tone is increased in legs, , R=L. Strength is  5 / 5 in all 4 extremities.   Sensory:  Sensory examination, there is reduced touch in bilateral arms and legs compared to upper cervical levels. Sensation is slightly worse on the left than the right.  Coordination: Cerebellar testing shows good finger to nose but reduced rotation bilaterally.  Gait and station: Station is normal.   Gait is mildly wide and mildly spastit. Tandem gait is mildly wide. Romberg is positive.   Reflexes: Deep tendon reflexes are symmetric and 3 in arms.  In legs, they are increased bilaterally.   He has spread at the knees.,  No ankle clonus.        DIAGNOSTIC DATA (LABS, IMAGING, TESTING) - I reviewed patient records, labs, notes, testing and imaging myself where available.  Lab Results  Component Value Date   WBC 4.6 07/16/2016   HGB 14.3 07/16/2016   HCT 41.8 07/16/2016   MCV 83.9 07/16/2016   PLT 276 07/16/2016      Component Value Date/Time   NA 139 07/16/2016 1520   K 4.1 07/16/2016 1520   CL 106 07/16/2016 1520   CO2 27 07/16/2016 1520   GLUCOSE 117 (H) 07/16/2016 1520   BUN 12 07/16/2016 1520   CREATININE 0.81 07/16/2016 1520   CREATININE 0.92 03/27/2015 1130   CALCIUM 9.3 07/16/2016 1520   PROT 6.8 03/27/2015 1216   ALBUMIN 4.3 03/27/2015 1216   AST 18 03/27/2015 1216   ALT 33 03/27/2015 1216   ALKPHOS 63 03/27/2015 1216   BILITOT 0.3 03/27/2015 1216   GFRNONAA >60 07/16/2016 1520   GFRNONAA  >89 10/10/2014 1251   GFRAA >60 07/16/2016 1520  GFRAA >89 10/10/2014 1251       ASSESSMENT AND PLAN  Multiple sclerosis (HCC) - Plan: Quantiferon tb gold assay (blood), Hepatitis B core antibody, total, Hepatitis B surface antigen, Hepatitis B surface antibody, Hepatic function panel, Stratify JCV Antibody Test ([facility])  Multiple sclerosis exacerbation (HCC)  Gait disturbance  Numbness  Other fatigue  High risk medication use - Plan: Quantiferon tb gold assay (blood), Hepatitis B core antibody, total, Hepatitis B surface antigen, Hepatitis B surface antibody, Hepatic function panel, Stratify JCV Antibody Test ([facility])     1.   He has relapsing remitting multiple sclerosis with aggressive features. He is experiencing significant breakthrough disease on Tecfidera. Specifically he has a new exacerbation and several enhancing lesions in the brain. Additionally, compared to an MRI in late 2016, he has at least a dozen new spots in the brain and a new plaque in the spinal cord.   We discussed options and I recommend that he switched to Tysabri or ocrelizumab. In the past, he was JCV antibody positive making the risks of Tysabri higher. Therefore, ocrelizumab would be a better choice. We will try to get him on this medication. I will check to make sure he does not have chronic hepatitis or latent tuberculosis.   He will get an additional 1 g dose of IV Solu-Medrol today and at least 2 more doses of IV Solu-Medrol next week. 2.    Continue baclofen and gabapentin for symptoms. 3.   Renew Adderall for MS related reduced attention and sleepiness.  4.   Return to see me in 6 months or sooner if there are new or worsening symptoms.  43 minutes face-to-face evaluation with greater than one half of the time counseling and coordinating care about his new MS exacerbation, MRI changes and need to change his disease modifying therapy.  Amarise Lillo A. Epimenio Foot, MD, PhD 07/17/2016, 10:33 AM Certified in  Neurology, Clinical Neurophysiology, Sleep Medicine, Pain Medicine and Neuroimaging  [facility] [address] [place], [place] [address] [phone]

## 2016-07-18 LAB — HEPATIC FUNCTION PANEL
ALT: 18 IU/L (ref 0–44)
AST: 12 IU/L (ref 0–40)
Albumin: 4.4 g/dL (ref 3.5–5.5)
Alkaline Phosphatase: 69 IU/L (ref 39–117)
Bilirubin Total: 0.6 mg/dL (ref 0.0–1.2)
Bilirubin, Direct: 0.15 mg/dL (ref 0.00–0.40)
Total Protein: 6.7 g/dL (ref 6.0–8.5)

## 2016-07-18 LAB — HEPATITIS B SURFACE ANTIBODY,QUALITATIVE: HEP B SURFACE AB, QUAL: REACTIVE

## 2016-07-18 LAB — HEPATITIS B CORE ANTIBODY, TOTAL: HEP B C TOTAL AB: POSITIVE — AB

## 2016-07-18 LAB — HEPATITIS B SURFACE ANTIGEN: HEP B S AG: NEGATIVE

## 2016-07-19 ENCOUNTER — Other Ambulatory Visit: Payer: Self-pay | Admitting: Neurology

## 2016-07-19 DIAGNOSIS — R768 Other specified abnormal immunological findings in serum: Secondary | ICD-10-CM | POA: Insufficient documentation

## 2016-07-19 DIAGNOSIS — R7689 Other specified abnormal immunological findings in serum: Secondary | ICD-10-CM | POA: Insufficient documentation

## 2016-07-19 NOTE — Progress Notes (Signed)
Please let him know that the labwork showed he might have had Hep B in the past ---   We need to check one more lab test - (Hep B core IgM antibody)  If it can be added to blood at [facility] we can do that, otherwise have him come in)

## 2016-07-20 ENCOUNTER — Ambulatory Visit (HOSPITAL_COMMUNITY)
Admission: RE | Admit: 2016-07-20 | Discharge: 2016-07-20 | Disposition: A | Discharge status: Home / Self Care | Payer: Medicaid Other | Source: Ambulatory Visit | Attending: Neurology | Admitting: Neurology

## 2016-07-20 DIAGNOSIS — G35 Multiple sclerosis: Secondary | ICD-10-CM | POA: Diagnosis not present

## 2016-07-20 MED ORDER — SODIUM CHLORIDE 0.9 % IV SOLN
1000.0000 mg | Freq: Every day | INTRAVENOUS | Status: DC
  Administered 2016-07-20: 1000 mg via INTRAVENOUS
  Filled 2016-07-20: qty 8

## 2016-07-21 ENCOUNTER — Ambulatory Visit (HOSPITAL_COMMUNITY)
Admission: RE | Admit: 2016-07-21 | Discharge: 2016-07-21 | Disposition: A | Discharge status: Home / Self Care | Payer: Medicaid Other | Source: Ambulatory Visit | Attending: Neurology | Admitting: Neurology

## 2016-07-21 ENCOUNTER — Telehealth: Payer: Self-pay | Admitting: *Deleted

## 2016-07-21 DIAGNOSIS — G35 Multiple sclerosis: Secondary | ICD-10-CM | POA: Insufficient documentation

## 2016-07-21 LAB — SPECIMEN STATUS REPORT

## 2016-07-21 MED ORDER — SODIUM CHLORIDE 0.9 % IV SOLN
1000.0000 mg | Freq: Every day | INTRAVENOUS | Status: DC
  Administered 2016-07-21: 10:00:00 1000 mg via INTRAVENOUS
  Filled 2016-07-21: qty 8

## 2016-07-21 MED FILL — OCREVUS 300 MG/10ML SOLN: 300 | 15 days supply | Qty: 20 | Fill #0

## 2016-07-21 NOTE — Telephone Encounter (Signed)
Ocrevus PA completed by phone with Medicaid (phone# [phone]).  Approved for dates 07-21-16 thru 07-16-17.  PA# [id number].   Pt. has tried and failed Tecfidera and Betaseron.  I have spoken with Sandi at [facility] (phone# [phone]) and let her know med has been approved/fim

## 2016-07-22 ENCOUNTER — Ambulatory Visit (HOSPITAL_COMMUNITY)
Admission: RE | Admit: 2016-07-22 | Discharge: 2016-07-22 | Disposition: A | Discharge status: Home / Self Care | Payer: Medicaid Other | Source: Ambulatory Visit | Attending: Neurology | Admitting: Neurology

## 2016-07-22 ENCOUNTER — Telehealth: Payer: Self-pay | Admitting: *Deleted

## 2016-07-22 DIAGNOSIS — G35 Multiple sclerosis: Secondary | ICD-10-CM | POA: Insufficient documentation

## 2016-07-22 LAB — HEPATITIS B CORE ANTIBODY, IGM: HEP B C IGM: NEGATIVE

## 2016-07-22 LAB — SPECIMEN STATUS REPORT

## 2016-07-22 MED ORDER — SODIUM CHLORIDE 0.9 % IV SOLN
1000.0000 mg | Freq: Every day | INTRAVENOUS | Status: DC
  Administered 2016-07-22: 09:00:00 1000 mg via INTRAVENOUS
  Filled 2016-07-22: qty 8

## 2016-07-22 NOTE — Telephone Encounter (Signed)
Will call pt. when his TB test comes back/fim

## 2016-07-22 NOTE — Telephone Encounter (Signed)
-----   Message from Asa Lente, MD sent at 07/22/2016  9:44 AM EST ----- Labs show that the hepatitis B surface antibody and hepatitis B core antibody were elevated. However, hepatitis B surface antigen and hepatitis B core IgM are negative. This implies that he has had hepatitis B in the past but does not have a chronic active form off it.     We are still awaiting the TB test  If the TB test is negative, he can go on ocrelizumab.

## 2016-07-23 ENCOUNTER — Encounter: Payer: Self-pay | Admitting: *Deleted

## 2016-07-23 LAB — QUANTIFERON TB GOLD ASSAY (BLOOD)

## 2016-07-23 LAB — QUANTIFERON IN TUBE
QUANTIFERON MITOGEN VALUE: 0.6 [IU]/mL
QUANTIFERON TB AG VALUE: 0.01 IU/mL
QUANTIFERON TB GOLD: NEGATIVE
Quantiferon Nil Value: 0.02 IU/mL

## 2016-07-28 ENCOUNTER — Emergency Department (HOSPITAL_COMMUNITY)
Admission: EM | Admit: 2016-07-28 | Discharge: 2016-07-28 | Disposition: A | Discharge status: Home / Self Care | Payer: Medicaid Other | Attending: Emergency Medicine | Admitting: Emergency Medicine

## 2016-07-28 ENCOUNTER — Encounter (HOSPITAL_COMMUNITY): Payer: Self-pay | Admitting: Emergency Medicine

## 2016-07-28 ENCOUNTER — Telehealth: Payer: Self-pay | Admitting: Neurology

## 2016-07-28 DIAGNOSIS — J069 Acute upper respiratory infection, unspecified: Secondary | ICD-10-CM | POA: Diagnosis not present

## 2016-07-28 DIAGNOSIS — Z79899 Other long term (current) drug therapy: Secondary | ICD-10-CM | POA: Insufficient documentation

## 2016-07-28 DIAGNOSIS — R0982 Postnasal drip: Secondary | ICD-10-CM | POA: Diagnosis not present

## 2016-07-28 DIAGNOSIS — Z87891 Personal history of nicotine dependence: Secondary | ICD-10-CM | POA: Diagnosis not present

## 2016-07-28 DIAGNOSIS — J029 Acute pharyngitis, unspecified: Secondary | ICD-10-CM | POA: Diagnosis present

## 2016-07-28 MED ORDER — AMPHETAMINE-DEXTROAMPHET ER 20 MG PO CP24: 20.0000 mg | ORAL_CAPSULE | Freq: Every day | ORAL | 0 refills | Status: DC

## 2016-07-28 MED ORDER — OXYMETAZOLINE HCL 0.05 % NA SOLN
1.0000 | Freq: Once | NASAL | Status: AC
  Administered 2016-07-28: 1 via NASAL
  Filled 2016-07-28: qty 15

## 2016-07-28 MED ORDER — LORATADINE 10 MG PO TABS
10.0000 mg | ORAL_TABLET | Freq: Once | ORAL | Status: AC
  Administered 2016-07-28: 10 mg via ORAL
  Filled 2016-07-28: qty 1

## 2016-07-28 MED ORDER — FLUTICASONE PROPIONATE 50 MCG/ACT NA SUSP: 1.0000 | Freq: Every day | NASAL | 0 refills | Status: DC

## 2016-07-28 MED ORDER — LORATADINE 10 MG PO TABS: 10.0000 mg | ORAL_TABLET | Freq: Every day | ORAL | 0 refills | Status: DC

## 2016-07-28 MED ORDER — ACETAMINOPHEN 500 MG PO TABS
1000.0000 mg | ORAL_TABLET | Freq: Once | ORAL | Status: AC
  Administered 2016-07-28: 1000 mg via ORAL
  Filled 2016-07-28: qty 2

## 2016-07-28 MED ORDER — PREDNISONE 10 MG PO TABS: ORAL_TABLET | ORAL | 0 refills | Status: DC

## 2016-07-28 NOTE — ED Notes (Signed)
Patient was alert, oriented and stable upon discharge. RN went over AVS and patient had no further questions.  

## 2016-07-28 NOTE — ED Triage Notes (Signed)
Patient states that he has had numbness in hands and feet for 3 weeks. was started on steroids last week by MD.

## 2016-07-28 NOTE — ED Notes (Signed)
ED Provider at bedside. 

## 2016-07-28 NOTE — Telephone Encounter (Signed)
Pt request refill for amphetamine-dextroamphetamine (ADDERALL XR) 20 MG 24 hr capsule . Pt is wanting to pick it up today if possible said he out of the medication

## 2016-07-28 NOTE — Telephone Encounter (Signed)
Adderall rx. up front [facility]/fim 

## 2016-07-28 NOTE — Telephone Encounter (Signed)
Rx. awaiting [facility] sig/fim 

## 2016-07-28 NOTE — Telephone Encounter (Signed)
I got a call from the [facility] concerning this patient. He has had a recent MS exacerbation, he has gotten 3 doses of IV solumedrol, and he is no better. MRI evaluation has confirmed new lesions in the brain and spinal cord.   I will call in a prednisone dosepack. He is to call our office if new symptoms arise. Plans are to switch him to Ocrevus in the near future.

## 2016-07-28 NOTE — ED Provider Notes (Signed)
[facility]-EMERGENCY DEPT Provider Note   CSN: [id number] Arrival date & time: 07/28/16  1405     History   Chief Complaint Chief Complaint  Patient presents with  . Numbness  . Multiple Sclerosis    HPI Mike Lowery is a 31 y.o. male.  The history is provided by the patient and a relative.  Neurologic Problem  This is a chronic problem. Episode onset: 3 weeks ago, unchanged after steroid administration. The problem occurs constantly. The problem has not changed since onset.Nothing aggravates the symptoms. Nothing relieves the symptoms. He has tried nothing for the symptoms.  Sore Throat  This is a new problem. The current episode started more than 2 days ago. The problem occurs constantly. The problem has not changed since onset.Associated symptoms comments: Runny nose. Nothing aggravates the symptoms. Nothing relieves the symptoms. Treatments tried: theraflu. The treatment provided no relief.    Past Medical History:  Diagnosis Date  . Multiple sclerosis (HCC) 12/24/13  . Multiple sclerosis (HCC)   . Vision abnormalities     Patient Active Problem List   Diagnosis Date Noted  . Hepatitis B antibody positive 07/19/2016  . Spasticity 06/17/2016  . Spells 07/22/2015  . Depression with anxiety 07/02/2015  . Other fatigue 05/01/2015  . Numbness 05/01/2015  . Gait disturbance 05/01/2015  . Disturbed cognition 05/01/2015  . Erectile dysfunction 05/01/2015  . Multiple sclerosis exacerbation (HCC) 03/11/2015  . Tobacco abuse 03/06/2015  . Nausea without vomiting 11/08/2014  . 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 Relapsing remitting multiple sclerosis (HCC) 10/01/2014  . Swelling of both hands 05/14/2014  . Multiple sclerosis (HCC) 12/24/2013    Past Surgical History:  Procedure Laterality Date  . NO PAST SURGERIES         Home Medications    Prior to Admission medications   Medication Sig Start Date End Date Taking? Authorizing Provider    amphetamine-dextroamphetamine (ADDERALL XR) 20 MG 24 hr capsule Take 1 capsule (20 mg total) by mouth daily. 07/28/16  Yes Asa Lente, MD  baclofen (LIORESAL) 10 MG tablet TAKE 1 TABLET EVERY MORNING, 1 IN THE EVENING, AND 2 AT BEDTIME FOR SPASTICITY 07/01/16  Yes Asa Lente, MD  Dimethyl Fumarate (TECFIDERA) 240 MG CPDR Take 1 capsule (240 mg total) by mouth 2 (two) times daily. 03/17/16  Yes Adam Mliss Fritz, DO  gabapentin (NEURONTIN) 300 MG capsule take 1 capsule by mouth EVERY MORNING, 1 EVERY EVENING AND 2 AT NIGHT FOR NERVE PAIN 07/01/16  Yes Asa Lente, MD  DULoxetine (CYMBALTA) 60 MG capsule Take 1 capsule (60 mg total) by mouth daily. Patient taking differently: Take 60 mg by mouth daily as needed (depression).  05/01/15   Asa Lente, MD  ibuprofen (ADVIL,MOTRIN) 200 MG tablet Take 400 mg by mouth every 6 (six) hours as needed.    Historical Provider, MD  levETIRAcetam (KEPPRA) 1000 MG tablet Take 1 tablet in the morning and 1 tablet at night./fim Patient not taking: Reported on 07/28/2016 06/17/16   Asa Lente, MD  naproxen (NAPROSYN) 500 MG tablet Take 1 tablet (500 mg total) by mouth 2 (two) times daily. 01/11/16   Urban Gibson, MD  ocrelizumab in sodium chloride 0.9 % 500 mL 300mg  IV on day 1, repeat 300mg  IV on day 15 07/17/16   Asa Lente, MD  penicillin v potassium (VEETID) 500 MG tablet Take 1 tablet (500 mg total) by mouth 4 (four) times daily. Patient not taking: Reported  on 07/16/2016 10/04/15   Garlon Hatchet, PA-C    Family History Family History  Problem Relation Age of Onset  . Healthy Mother   . Healthy Father   . Cancer Maternal Grandmother     unknown   . Ataxia Sister   . Multiple sclerosis Neg Hx     Social History Social History  Substance Use Topics  . Smoking status: Former Smoker    Types: Cigarettes    Quit date: 04/18/2013  . Smokeless tobacco: Former Neurosurgeon    Quit date: 09/08/2014  . Alcohol use Yes     Allergies   Patient has  no known allergies.   Review of Systems Review of Systems  All other systems reviewed and are negative.    Physical Exam Updated Vital Signs BP 121/72 (BP Location: Right Arm)   Pulse 83   Temp 97.5 F (36.4 C) (Oral)   Resp 17   SpO2 96%   Physical Exam  Constitutional: He is oriented to person, place, and time. He appears well-developed and well-nourished. No distress.  HENT:  Head: Normocephalic and atraumatic.  Nose: Nose normal.  Mouth/Throat: Oropharynx is clear and moist.  Eyes: Conjunctivae are normal.  Neck: Neck supple. No tracheal deviation present.  Cardiovascular: Normal rate, regular rhythm and normal heart sounds.   Pulmonary/Chest: Effort normal and breath sounds normal. No respiratory distress.  Abdominal: Soft. He exhibits no distension.  Neurological: He is alert and oriented to person, place, and time. He has normal strength. No sensory deficit. Coordination normal.  Skin: Skin is warm and dry.  Psychiatric: He has a normal mood and affect.  Vitals reviewed.    ED Treatments / Results  Labs (all labs ordered are listed, but only abnormal results are displayed) Labs Reviewed - No data to display  EKG  EKG Interpretation None       Radiology No results found.  Procedures Procedures (including critical care time)  Medications Ordered in ED Medications - No data to display   Initial Impression / Assessment and Plan / ED Course  I have reviewed the triage vital signs and the nursing notes.  Pertinent labs & imaging results that were available during my care of the patient were reviewed by me and considered in my medical decision making (see chart for details).     31 y.o. male presents with 3 weeks of ongoing numbness and tingling below waist d/t MS flare. This has been unchanged but he has since developed URI Sx which prompted visit to the ED. He appears to have mild nasal congestion and sore throat from post nasal drip. He will be  provided afrin for short-term relief and started on flonase and Claritin to suppress symptoms.  Dr Anne Hahn to call in prescription for prednisone taper for 2 weeks. We spoke about the patient's upcoming additional treatment but he will not be eligible to start yet so they will try to treat with steroids. Pt instructed to call his neurologist for further recommendations. Return precautions discussed for worsening or new concerning symptoms.   Final Clinical Impressions(s) / ED Diagnoses   Final diagnoses:  Post-nasal drip  Viral URI    New Prescriptions Discharge Medication List as of 07/28/2016  6:32 PM    START taking these medications   Details  fluticasone (FLONASE) 50 MCG/ACT nasal spray Place 1 spray into both nostrils daily., Starting Tue 07/28/2016, Print    loratadine (CLARITIN) 10 MG tablet Take 1 tablet (10 mg total) by mouth  daily., Starting Tue 07/28/2016, Print    predniSONE (DELTASONE) 10 MG tablet Begin taking 6 tablets daily, taper by one tablet every other day until off the medication., Normal         Lyndal Pulley, MD 07/29/16 [phone]

## 2016-08-03 ENCOUNTER — Other Ambulatory Visit: Payer: Self-pay | Admitting: Neurology

## 2016-08-03 ENCOUNTER — Encounter (HOSPITAL_COMMUNITY): Payer: Self-pay | Admitting: Emergency Medicine

## 2016-08-03 ENCOUNTER — Emergency Department (HOSPITAL_COMMUNITY)
Admission: EM | Admit: 2016-08-03 | Discharge: 2016-08-04 | Disposition: A | Discharge status: Home / Self Care | Payer: Medicaid Other | Attending: Emergency Medicine | Admitting: Emergency Medicine

## 2016-08-03 DIAGNOSIS — G35 Multiple sclerosis: Secondary | ICD-10-CM | POA: Insufficient documentation

## 2016-08-03 DIAGNOSIS — Z87891 Personal history of nicotine dependence: Secondary | ICD-10-CM | POA: Diagnosis not present

## 2016-08-03 DIAGNOSIS — M79605 Pain in left leg: Secondary | ICD-10-CM

## 2016-08-03 DIAGNOSIS — M79604 Pain in right leg: Secondary | ICD-10-CM

## 2016-08-03 DIAGNOSIS — M79661 Pain in right lower leg: Secondary | ICD-10-CM | POA: Diagnosis present

## 2016-08-03 MED ORDER — SODIUM CHLORIDE 0.9 % IV SOLN
1000.0000 mg | Freq: Once | INTRAVENOUS | Status: AC
  Administered 2016-08-03: 1000 mg via INTRAVENOUS
  Filled 2016-08-03: qty 8

## 2016-08-03 MED ORDER — OXYCODONE-ACETAMINOPHEN 5-325 MG PO TABS
1.0000 | ORAL_TABLET | Freq: Once | ORAL | Status: AC
  Administered 2016-08-03: 1 via ORAL
  Filled 2016-08-03: qty 1

## 2016-08-03 MED ORDER — HYDROCODONE-ACETAMINOPHEN 5-325 MG PO TABS: 1.0000 | ORAL_TABLET | Freq: Three times a day (TID) | ORAL | 0 refills | Status: DC | PRN

## 2016-08-03 NOTE — Telephone Encounter (Signed)
I have spoken with Mike Lowery in the ER at [facility]. on his way there this afternoon--has MS with new lesions, likely to have exacerbation of sx. at this time.  RAS rec. SM 1gm IV; if pt. is able to be d/c home after that, we can given another 2 days of IV SM 1gm in our office Tues and Wed.  Fiance verbalized understanding of same/fim

## 2016-08-03 NOTE — ED Provider Notes (Signed)
[facility]ER[facility]Y DEPT Provider Note   CSN: [id number] Arrival date & time: 08/03/16  1749  By signing my name below, I, Elder Negus, attest that this documentation has been prepared under the direction and in the presence of Shon Baton, MD. Electronically Signed: Elder Negus, Scribe. 08/03/16. 11:16 PM.   History   Chief Complaint Chief Complaint  Patient presents with  . Leg Pain    HPI Mike Lowery is a 31 y.o. male with history of multiple sclerosis who presents to the ED for evaluation of leg pain and neurologic complaints. This patient was initially see at [facility] 6 days ago with "whole body numbness" and bilateral foot pain which he states is typical for him during a MS flare. He was discharged on a prednisone taper which he states he is taking. He re-presents tonight stating that his pain symptoms have worsened; he is also reporting onset of R hand pain which makes it difficult for him to grip objects. He is ambulatory; although with balance problems. Also reporting lower back pain. No chest pain. No bowel or bladder problems. He is denying any focal weaknesses. Pain is 8/10 currently; described as "sharp". He is taking hydrocodone at home for pain relief. He is followed by a neurologist; recently had MRI brain on 3/1 with new lesions.  Per neurology notes, patient has severe relapsing and remitting multiple sclerosis that has been difficult to treat.  The history is provided by the patient. No language interpreter was used.    Past Medical History:  Diagnosis Date  . Multiple sclerosis (HCC) 12/24/13  . Multiple sclerosis (HCC)   . Vision abnormalities     Patient Active Problem List   Diagnosis Date Noted  . Hepatitis B antibody positive 07/19/2016  . Spasticity 06/17/2016  . Spells 07/22/2015  . Depression with anxiety 07/02/2015  . Other fatigue 05/01/2015  . Numbness 05/01/2015  . Gait disturbance 05/01/2015  . Disturbed cognition 05/01/2015  .  Erectile dysfunction 05/01/2015  . Multiple sclerosis exacerbation (HCC) 03/11/2015  . Tobacco abuse 03/06/2015  . Nausea without vomiting 11/08/2014  . 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 Relapsing remitting multiple sclerosis (HCC) 10/01/2014  . Swelling of both hands 05/14/2014  . Multiple sclerosis (HCC) 12/24/2013    Past Surgical History:  Procedure Laterality Date  . NO PAST SURGERIES         Home Medications    Prior to Admission medications   Medication Sig Start Date End Date Taking? Authorizing Provider  amphetamine-dextroamphetamine (ADDERALL XR) 20 MG 24 hr capsule Take 1 capsule (20 mg total) by mouth daily. 07/28/16  Yes Asa Lente, MD  baclofen (LIORESAL) 10 MG tablet TAKE 1 TABLET EVERY MORNING, 1 IN THE EVENING, AND 2 AT BEDTIME FOR SPASTICITY 07/01/16  Yes Asa Lente, MD  Dimethyl Fumarate (TECFIDERA) 240 MG CPDR Take 1 capsule (240 mg total) by mouth 2 (two) times daily. 03/17/16  Yes Adam Mliss Fritz, DO  DULoxetine (CYMBALTA) 60 MG capsule Take 1 capsule (60 mg total) by mouth daily. Patient taking differently: Take 60 mg by mouth daily as needed (depression).  05/01/15  Yes Asa Lente, MD  fluticasone (FLONASE) 50 MCG/ACT nasal spray Place 1 spray into both nostrils daily. 07/28/16  Yes Lyndal Pulley, MD  gabapentin (NEURONTIN) 300 MG capsule take 1 capsule by mouth EVERY MORNING, 1 EVERY EVENING AND 2 AT NIGHT FOR NERVE PAIN 07/01/16  Yes Asa Lente, MD  ibuprofen (ADVIL,MOTRIN) 200  MG tablet Take 400 mg by mouth every 6 (six) hours as needed for moderate pain.    Yes Historical Provider, MD  levETIRAcetam (KEPPRA) 1000 MG tablet Take 1 tablet in the morning and 1 tablet at night./fim Patient taking differently: Take 500 mg by mouth 2 (two) times daily as needed (mood).  06/17/16  Yes Asa Lente, MD  loratadine (CLARITIN) 10 MG tablet Take 1 tablet (10 mg total) by mouth daily. 07/28/16  Yes Lyndal Pulley, MD  ocrelizumab in  sodium chloride 0.9 % 500 mL 300mg  IV on day 1, repeat 300mg  IV on day 15 07/17/16  Yes Asa Lente, MD  oxyCODONE-acetaminophen (PERCOCET/ROXICET) 5-325 MG tablet Take 1-2 tablets by mouth every 4 (four) hours as needed for severe pain. 08/04/16   Shon Baton, MD    Family History Family History  Problem Relation Age of Onset  . Healthy Mother   . Healthy Father   . Cancer Maternal Grandmother     unknown   . Ataxia Sister   . Multiple sclerosis Neg Hx     Social History Social History  Substance Use Topics  . Smoking status: Former Smoker    Types: Cigarettes    Quit date: 04/18/2013  . Smokeless tobacco: Former Neurosurgeon    Quit date: 09/08/2014  . Alcohol use Yes     Allergies   Patient has no known allergies.   Review of Systems Review of Systems  Constitutional: Negative for fever.  Respiratory: Negative for shortness of breath.   Cardiovascular: Negative for chest pain.  Musculoskeletal: Positive for back pain.       Lower extremity pain per HPI  Neurological: Positive for numbness. Negative for weakness.       Balance problems.   All other systems reviewed and are negative.    Physical Exam Updated Vital Signs BP (!) 94/52   Pulse 94   Temp 98.6 F (37 C) (Oral)   Resp 16   Ht 5\' 7"  (1.702 m)   Wt 145 lb (65.8 kg)   SpO2 98%   BMI 22.71 kg/m   Physical Exam  Constitutional: He is oriented to person, place, and time. He appears well-developed and well-nourished. No distress.  HENT:  Head: Normocephalic and atraumatic.  Eyes: Pupils are equal, round, and reactive to light.  Cardiovascular: Normal rate, regular rhythm and normal heart sounds.   No murmur heard. Pulmonary/Chest: Effort normal and breath sounds normal. No respiratory distress. He has no wheezes.  Abdominal: Soft. There is no tenderness.  Musculoskeletal: He exhibits no edema.  Neurological: He is alert and oriented to person, place, and time.  Cranial nerves II through XII  intact, 4+ out of 5 strength with right grip strength, otherwise 5 out of 5 strength bilateral upper extremities, good strength bilateral lower extremities with proximal muscles, brisk patellar reflexes bilaterally but symmetric  Skin: Skin is warm and dry.  Psychiatric: He has a normal mood and affect.  Nursing note and vitals reviewed.    ED Treatments / Results  Labs (all labs ordered are listed, but only abnormal results are displayed) Labs Reviewed - No data to display  EKG  EKG Interpretation None       Radiology No results found.  Procedures Procedures (including critical care time)  Medications Ordered in ED Medications  morphine 4 MG/ML injection 4 mg (not administered)  oxyCODONE-acetaminophen (PERCOCET/ROXICET) 5-325 MG per tablet 1 tablet (1 tablet Oral Given 08/03/16 2326)  methylPREDNISolone sodium succinate (SOLU-MEDROL)  1,000 mg in sodium chloride 0.9 % 50 mL IVPB (1,000 mg Intravenous Given 08/03/16 2358)  morphine 4 MG/ML injection 4 mg (4 mg Intravenous Given 08/04/16 0102)     Initial Impression / Assessment and Plan / ED Course  I have reviewed the triage vital signs and the nursing notes.  Pertinent labs & imaging results that were available during my care of the patient were reviewed by me and considered in my medical decision making (see chart for details).  Clinical Course as of Aug 04 213  Tue Aug 04, 2016  0031 SM infusing. COntinued pain.  Not improved with PO meds.  Morphine ordered.  [CH]    Clinical Course User Index [CH] Shon Baton, MD    Per chart, patient given 1 g of Solu-Medrol for presumed multiple sclerosis flare. He appears to be at his baseline and pain appears acute on chronic as well as the numbness. He has recent MRI with progressive lesions. Patient was also given IV pain medication.  2:16 AM On recheck, patient reports persistent pain. IV infusion is done. I discussed with patient options including close follow-up  with neurologist for repeat IV steroids and adjunctive pain medicine versus admission for pain control. Patient elected to follow-up with neurologist. I will change his hydrocodone to a short course of Percocet for adjunctive pain.  After history, exam, and medical workup I feel the patient has been appropriately medically screened and is safe for discharge home. Pertinent diagnoses were discussed with the patient. Patient was given return precautions.   Final Clinical Impressions(s) / ED Diagnoses   Final diagnoses:  Pain in both lower extremities  Multiple sclerosis (HCC)    New Prescriptions New Prescriptions   OXYCODONE-ACETAMINOPHEN (PERCOCET/ROXICET) 5-325 MG TABLET    Take 1-2 tablets by mouth every 4 (four) hours as needed for severe pain.   I personally performed the services described in this documentation, which was scribed in my presence. The recorded information has been reviewed and is accurate.    Shon Baton, MD 08/04/16 [phone]

## 2016-08-03 NOTE — ED Triage Notes (Signed)
Pt here for increased pain from MS in bilateral legs and right arm

## 2016-08-03 NOTE — Telephone Encounter (Addendum)
Pt's fiancee called said he is in severe pain. Seems nothing is working. Please call asap at [phone]

## 2016-08-03 NOTE — Telephone Encounter (Signed)
LMTC./fim 

## 2016-08-04 MED ORDER — MORPHINE SULFATE (PF) 4 MG/ML IV SOLN
4.0000 mg | Freq: Once | INTRAVENOUS | Status: AC
  Administered 2016-08-04: 4 mg via INTRAVENOUS
  Filled 2016-08-04: qty 1

## 2016-08-04 MED ORDER — MORPHINE SULFATE (PF) 4 MG/ML IV SOLN: 8.0000 mg | Freq: Once | INTRAVENOUS | Status: DC

## 2016-08-04 MED ORDER — OXYCODONE-ACETAMINOPHEN 5-325 MG PO TABS: 1.0000 | ORAL_TABLET | ORAL | 0 refills | Status: DC | PRN

## 2016-08-04 NOTE — ED Notes (Signed)
All medications completed.

## 2016-08-04 NOTE — Discharge Instructions (Signed)
You were seen today for pain related to multiple sclerosis. Follow-up with your neurologist later today for repeat steroid injections. You will be given a short prescription of increased pain medication at home. Follow-up with your neurologist for further adjunctive pain medication. Do not take hydrocodone and Percocet together as they both contain Tylenol.

## 2016-08-04 NOTE — ED Notes (Signed)
Pt complete asleep with family member on the stretcher at the time of given morphine.

## 2016-08-04 NOTE — Telephone Encounter (Signed)
Pt called this morning wanting to know when he can come in today for infusion. Please call asap

## 2016-08-04 NOTE — Telephone Encounter (Signed)
Per RAS, will arrange for SM 1gm IV today.  Pt. scheduled for part A of first Ocrevus infusion tomorrow at [facility] short stay, arrival time of 0845/fim

## 2016-08-05 ENCOUNTER — Encounter (HOSPITAL_COMMUNITY): Payer: Self-pay

## 2016-08-05 ENCOUNTER — Encounter (HOSPITAL_COMMUNITY)
Admission: RE | Admit: 2016-08-05 | Discharge: 2016-08-05 | Disposition: A | Discharge status: Home / Self Care | Payer: Medicaid Other | Source: Ambulatory Visit | Attending: Neurology | Admitting: Neurology

## 2016-08-05 DIAGNOSIS — G35 Multiple sclerosis: Secondary | ICD-10-CM | POA: Diagnosis present

## 2016-08-05 HISTORY — DX: Major depressive disorder, single episode, unspecified: F32.9

## 2016-08-05 HISTORY — DX: Dyspnea, unspecified: R06.00

## 2016-08-05 HISTORY — DX: Depression, unspecified: F32.A

## 2016-08-05 MED ORDER — DIPHENHYDRAMINE HCL 50 MG/ML IJ SOLN
50.0000 mg | Freq: Once | INTRAMUSCULAR | Status: AC
  Administered 2016-08-05: 50 mg via INTRAVENOUS
  Filled 2016-08-05: qty 1

## 2016-08-05 MED ORDER — SODIUM CHLORIDE 0.9 % IV SOLN
500.0000 mg | Freq: Once | INTRAVENOUS | Status: AC
  Administered 2016-08-05: 500 mg via INTRAVENOUS
  Filled 2016-08-05: qty 4

## 2016-08-05 MED ORDER — SODIUM CHLORIDE 0.9 % IV SOLN
300.0000 mg | Freq: Once | INTRAVENOUS | Status: AC
  Administered 2016-08-05: 300 mg via INTRAVENOUS
  Filled 2016-08-05: qty 10

## 2016-08-05 MED ORDER — SODIUM CHLORIDE 0.9 % IV SOLN
INTRAVENOUS | Status: DC
  Administered 2016-08-05: 10:00:00 via INTRAVENOUS

## 2016-08-05 NOTE — Discharge Instructions (Signed)
Ocrelizumab injection / Ocrevus What is this medicine? OCRELIZUMAB (ok re LIZ ue mab) treats multiple sclerosis. It helps to decrease the number of multiple sclerosis relapses. It is not a cure. This medicine may be used for other purposes; ask your health care provider or pharmacist if you have questions. COMMON BRAND NAME(S): OCREVUS What should I tell my health care provider before I take this medicine? They need to know if you have any of these conditions: -cancer -hepatitis B infection -other infection (especially a virus infection such as chickenpox, cold sores, or herpes) -an unusual or allergic reaction to ocrelizumab, other medicines, foods, dyes or preservatives -pregnant or trying to get pregnant -breast-feeding How should I use this medicine? This medicine is for infusion into a vein. It is given by a health care professional in a hospital or clinic setting. Talk to your pediatrician regarding the use of this medicine in children. Special care may be needed. Overdosage: If you think you have taken too much of this medicine contact a poison control center or emergency room at once. NOTE: This medicine is only for you. Do not share this medicine with others. What if I miss a dose? Keep appointments for follow-up doses as directed. It is important not to miss your dose. Call your doctor or health care professional if you are unable to keep an appointment. What may interact with this medicine? -alemtuzumab -daclizumab -dimethyl fumarate -fingolimod -glatiramer -interferon beta -live virus vaccines -mitoxantrone -natalizumab -peginterferon beta -rituximab -steroid medicines like prednisone or cortisone -teriflunomide This list may not describe all possible interactions. Give your health care provider a list of all the medicines, herbs, non-prescription drugs, or dietary supplements you use. Also tell them if you smoke, drink alcohol, or use illegal drugs. Some items may  interact with your medicine. What should I watch for while using this medicine? Tell your doctor or healthcare professional if your symptoms do not start to get better or if they get worse. This medicine can cause serious allergic reactions. To reduce your risk you may need to take medicine before treatment with this medicine. Take your medicine as directed. Women should inform their doctor if they wish to become pregnant or think they might be pregnant. There is a potential for serious side effects to an unborn child. Talk to your health care professional or pharmacist for more information. Male patients should use effective birth control methods while receiving this medicine and for 6 months after the last dose. Call your doctor or health care professional for advice if you get a fever, chills or sore throat, or other symptoms of a cold or flu. Do not treat yourself. This drug decreases your body's ability to fight infections. Try to avoid being around people who are sick. If you have a hepatitis B infection or a history of a hepatitis B infection, talk to your doctor. The symptoms of hepatitis B may get worse if you take this medicine. In some patients, this medicine may cause a serious brain infection that may cause death. If you have any problems seeing, thinking, speaking, walking, or standing, tell your doctor right away. If you cannot reach your doctor, urgently seek other source of medical care. This medicine can decrease the response to a vaccine. If you need to get vaccinated, tell your healthcare professional if you have received this medicine. Extra booster doses may be needed. Talk to your doctor to see if a different vaccination schedule is needed. Talk to your doctor about your risk  of cancer. You may be more at risk for certain types of cancers if you take this medicine. What side effects may I notice from receiving this medicine? Side effects that you should report to your doctor or  health care professional as soon as possible: -allergic reactions like skin rash, itching or hives, swelling of the face, lips, or tongue -breathing problems -facial flushing -fast, irregular heartbeat -lump or soreness in the breast -signs and symptoms of herpes such as cold sore, shingles, or genital sores -signs and symptoms of infection like fever or chills, cough, sore throat, pain or trouble passing urine -signs and symptoms of low blood pressure like dizziness; feeling faint or lightheaded, falls; unusually weak or tired -signs and symptoms of progressive multifocal leukoencephalopathy (PML) like changes in vision; clumsiness; confusion; personality changes; weakness on one side of the body -swelling of the ankles, feet, hands Side effects that usually do not require medical attention (report these to your doctor or health care professional if they continue or are bothersome): -back pain -depressed mood -diarrhea -pain, redness, or irritation at site where injected This list may not describe all possible side effects. Call your doctor for medical advice about side effects. You may report side effects to FDA at 1-800-FDA-1088. Where should I keep my medicine? This drug is given in a hospital or clinic and will not be stored at home. NOTE: This sheet is a summary. It may not cover all possible information. If you have questions about this medicine, talk to your doctor, pharmacist, or health care provider.  2018 Elsevier/Gold Standard (2015-08-20 09:40:25)

## 2016-08-05 NOTE — Progress Notes (Signed)
Pt presents today for first ocrevus infusion with sinus drainage, sore throat, afebrile.  Consult Dr. Bonnita Hollow office (Faith, RN) to verify TB results, Hepatitis results and infusion confirmation.  Currently, Pt tolerating infusion well after pre-meds as ordered.

## 2016-08-10 ENCOUNTER — Telehealth: Payer: Self-pay | Admitting: Neurology

## 2016-08-10 NOTE — Telephone Encounter (Signed)
Mike Lowery called answer phone reg her son, Mike Lowery,  When I called back there was a lot of noise, someone yelled and hung up, I tried twice. Second call : 'I don't know who called you" verified phone number, name. Patient was unaware of call. Asked him to call back if Q. CD

## 2016-08-10 NOTE — Telephone Encounter (Signed)
Noted/fim 

## 2016-08-11 ENCOUNTER — Telehealth: Payer: Self-pay | Admitting: Neurology

## 2016-08-11 NOTE — Telephone Encounter (Signed)
[facility] called to verify Tecfidera medication and any others. Please call. Thank you

## 2016-08-11 NOTE — Telephone Encounter (Signed)
Tried to call [facility] back but number will not connect x2. This is no longer a patient here.

## 2016-08-13 ENCOUNTER — Telehealth: Payer: Self-pay | Admitting: Neurology

## 2016-08-13 NOTE — Telephone Encounter (Signed)
I spoke with Mike Lowery earlier today and he received SM 1gm IV in our office this afternoon/fim

## 2016-08-13 NOTE — Telephone Encounter (Signed)
[facility] [phone] called to confirm pt has started ocrevus and has d/c tecfidera. Please call

## 2016-08-13 NOTE — Telephone Encounter (Signed)
Patient called office in reference to requested an appointment to have a steroid injection (not to sure of name) for his numbness.  Please call

## 2016-08-13 NOTE — Telephone Encounter (Signed)
I have spoken with [facility] and confirmed pt. has d/c Tecfidera,  has had 1st Ocrelizumab infusion/fim

## 2016-08-19 ENCOUNTER — Encounter (HOSPITAL_COMMUNITY)
Admission: RE | Admit: 2016-08-19 | Discharge: 2016-08-19 | Disposition: A | Discharge status: Home / Self Care | Payer: Medicaid Other | Source: Ambulatory Visit | Attending: Neurology | Admitting: Neurology

## 2016-08-19 DIAGNOSIS — G35 Multiple sclerosis: Secondary | ICD-10-CM | POA: Insufficient documentation

## 2016-08-19 MED ORDER — DIPHENHYDRAMINE HCL 25 MG PO CAPS
50.0000 mg | ORAL_CAPSULE | Freq: Once | ORAL | Status: AC
Start: 2016-08-19 — End: 2016-08-19
  Administered 2016-08-19: 50 mg via ORAL
  Filled 2016-08-19: qty 2

## 2016-08-19 MED ORDER — SODIUM CHLORIDE 0.9 % IV SOLN
INTRAVENOUS | Status: DC
  Administered 2016-08-19: 09:00:00 via INTRAVENOUS

## 2016-08-19 MED ORDER — DIPHENHYDRAMINE HCL 50 MG/ML IJ SOLN
50.0000 mg | Freq: Once | INTRAMUSCULAR | Status: DC
  Filled 2016-08-19: qty 1

## 2016-08-19 MED ORDER — SODIUM CHLORIDE 0.9 % IV SOLN
300.0000 mg | Freq: Once | INTRAVENOUS | Status: AC
  Administered 2016-08-19: 300 mg via INTRAVENOUS
  Filled 2016-08-19: qty 10

## 2016-08-19 MED ORDER — SODIUM CHLORIDE 0.9 % IV SOLN
500.0000 mg | Freq: Once | INTRAVENOUS | Status: AC
  Administered 2016-08-19: 500 mg via INTRAVENOUS
  Filled 2016-08-19: qty 4

## 2016-08-19 NOTE — Progress Notes (Signed)
Tolerated second ocrevus infusion without event.  Stated he had a nose bleed between infusions.

## 2016-08-25 ENCOUNTER — Telehealth: Payer: Self-pay | Admitting: Neurology

## 2016-08-25 MED ORDER — AMPHETAMINE-DEXTROAMPHET ER 20 MG PO CP24: 20.0000 mg | ORAL_CAPSULE | Freq: Every day | ORAL | 0 refills | Status: DC

## 2016-08-25 MED ORDER — HYDROCODONE-ACETAMINOPHEN 5-325 MG PO TABS: ORAL_TABLET | ORAL | 0 refills | Status: DC

## 2016-08-25 NOTE — Telephone Encounter (Signed)
Patient requesting refill of amphetamine-dextroamphetamine (ADDERALL XR) 20 MG 24 hr capsule and   oxyCODONE-acetaminophen (PERCOCET/ROXICET) 5-325 MG tablet

## 2016-08-25 NOTE — Addendum Note (Signed)
Addended by: Candis Schatz I on: 08/25/2016 04:55 PM   Modules accepted: Orders

## 2016-08-25 NOTE — Addendum Note (Signed)
Addended by: Candis Schatz I on: 08/25/2016 04:51 PM   Modules accepted: Orders

## 2016-08-25 NOTE — Telephone Encounter (Signed)
Rx's awaiting RAS sig/fim 

## 2016-08-26 NOTE — Telephone Encounter (Signed)
Adderall and Hydrocodone rx's up front [facility]/fim 

## 2016-08-27 ENCOUNTER — Telehealth: Payer: Self-pay | Admitting: *Deleted

## 2016-08-27 NOTE — Telephone Encounter (Signed)
Hydrocodone PA (5/325mg , #30/30)completed and faxed to [facility] fax# [phone]/fim

## 2016-09-28 ENCOUNTER — Telehealth: Payer: Self-pay | Admitting: Neurology

## 2016-09-28 MED ORDER — AMPHETAMINE-DEXTROAMPHET ER 20 MG PO CP24: 20.0000 mg | ORAL_CAPSULE | Freq: Every day | ORAL | 0 refills | Status: DC

## 2016-09-28 NOTE — Addendum Note (Signed)
Addended by: Candis Schatz I on: 09/28/2016 11:57 AM   Modules accepted: Orders

## 2016-09-28 NOTE — Telephone Encounter (Signed)
Rx. awaiting [facility] sig/fim 

## 2016-09-28 NOTE — Telephone Encounter (Signed)
Patient requesting refill of  amphetamine-dextroamphetamine (ADDERALL XR) 20 MG 24 hr capsule. ° ° °

## 2016-09-28 NOTE — Telephone Encounter (Signed)
Rx. up front [facility]/fim 

## 2016-10-27 ENCOUNTER — Telehealth: Payer: Self-pay | Admitting: Neurology

## 2016-10-27 MED ORDER — AMPHETAMINE-DEXTROAMPHET ER 20 MG PO CP24: 20.0000 mg | ORAL_CAPSULE | Freq: Every day | ORAL | 0 refills | Status: DC

## 2016-10-27 NOTE — Telephone Encounter (Signed)
Rx. up front [facility]/fim 

## 2016-10-27 NOTE — Addendum Note (Signed)
Addended by: Candis Schatz I on: 10/27/2016 01:41 PM   Modules accepted: Orders

## 2016-10-27 NOTE — Telephone Encounter (Signed)
Patient requesting refill of  amphetamine-dextroamphetamine (ADDERALL XR) 20 MG 24 hr capsule. ° ° °

## 2016-10-27 NOTE — Telephone Encounter (Signed)
Rx. awaiting [facility] sig/fim 

## 2016-11-23 ENCOUNTER — Encounter: Payer: Self-pay | Admitting: Rehabilitation

## 2016-11-23 NOTE — Therapy (Signed)
[facility] [address] [place], [place], [address] Phone: [phone]   Fax:  [phone]  Patient Details  Name: Mike Lowery MRN: [id number] Date of Birth: [birth year] Referring Provider:  No ref. provider found  Encounter Date: 11/23/2016    PHYSICAL THERAPY DISCHARGE SUMMARY  Visits from Start of Care: 1  Current functional level related to goals / functional outcomes:     PT Long Term Goals - 02/24/16 2004      PT LONG TERM GOAL #1   Title Pt will be able to maintain gait speed of >2.62 ft/sec x 5 mins in order to indicate safe negotiation in community.  (Target Date:  Third visit following eval)   Baseline 3.51 ft/sec, however only able to maintain approx 100' on 02/24/16   Status New     PT LONG TERM GOAL #2   Title Pt will be able to perform SLS x 15 secs on each LE in order to indicate improved strength and balance.     Baseline only able to perform up to 5 secs on each LE 02/24/16   Status New     PT LONG TERM GOAL #3   Title Pt will ambulate 1000' over unlevel paved surfaces w/ LRAD at mod I level in order to improve functional mobility.     Baseline 230' at S level over even terrain   Status New     PT LONG TERM GOAL #4   Title Pt will ascend/descend up to 15 stairs with single rail alternating pattern at mod I level in order to negotiate in home safely.     Baseline 4 steps w/ B rails at mod I level, step to fashion   Status New        Remaining deficits: Unsure as he did not return for follow up visits past evaluation.    Education / Equipment: HEP  Plan: Patient agrees to discharge.  Patient goals were not met. Patient is being discharged due to not returning since the last visit.  ?????         Cameron Sprang, PT, MPT [facility] [address] [place], [place], [address] Phone: [phone]   Fax:  [phone] 11/23/16, 9:06 AM

## 2016-11-26 ENCOUNTER — Telehealth: Payer: Self-pay | Admitting: Neurology

## 2016-11-26 MED ORDER — AMPHETAMINE-DEXTROAMPHET ER 20 MG PO CP24: 20.0000 mg | ORAL_CAPSULE | Freq: Every day | ORAL | 0 refills | Status: DC

## 2016-11-26 NOTE — Telephone Encounter (Signed)
Patients gf Keyanda (listed on DPR) called office requesting refill request for patient amphetamine-dextroamphetamine (ADDERALL XR) 20 MG 24 hr capsule. Keyanda advised office closes at noon tomorrow and patient is out of the medication.

## 2016-11-26 NOTE — Telephone Encounter (Signed)
Rx. up front [facility]/fim 

## 2016-11-26 NOTE — Addendum Note (Signed)
Addended by: Candis Schatz I on: 11/26/2016 02:23 PM   Modules accepted: Orders

## 2016-11-26 NOTE — Telephone Encounter (Signed)
Rx. awaiting [facility] sig/fim 

## 2016-12-21 ENCOUNTER — Ambulatory Visit (INDEPENDENT_AMBULATORY_CARE_PROVIDER_SITE_OTHER): Payer: Medicaid Other | Admitting: Neurology

## 2016-12-21 ENCOUNTER — Encounter: Payer: Self-pay | Admitting: Neurology

## 2016-12-21 VITALS — BP 120/64 | HR 70 | Ht 67.0 in | Wt 149.0 lb

## 2016-12-21 DIAGNOSIS — G35 Multiple sclerosis: Secondary | ICD-10-CM | POA: Diagnosis not present

## 2016-12-21 DIAGNOSIS — R252 Cramp and spasm: Secondary | ICD-10-CM

## 2016-12-21 DIAGNOSIS — R4189 Other symptoms and signs involving cognitive functions and awareness: Secondary | ICD-10-CM

## 2016-12-21 DIAGNOSIS — R269 Unspecified abnormalities of gait and mobility: Secondary | ICD-10-CM | POA: Diagnosis not present

## 2016-12-21 DIAGNOSIS — R5383 Other fatigue: Secondary | ICD-10-CM | POA: Diagnosis not present

## 2016-12-21 DIAGNOSIS — Z79899 Other long term (current) drug therapy: Secondary | ICD-10-CM | POA: Diagnosis not present

## 2016-12-21 DIAGNOSIS — F329 Major depressive disorder, single episode, unspecified: Secondary | ICD-10-CM

## 2016-12-21 MED ORDER — AMPHETAMINE-DEXTROAMPHET ER 20 MG PO CP24: 20.0000 mg | ORAL_CAPSULE | Freq: Every day | ORAL | 0 refills | Status: DC

## 2016-12-21 NOTE — Progress Notes (Signed)
[facility]  PATIENT: Mike Lowery DOB: [birth year]  REFERRING DOCTOR OR PCP:  Dr. Hyman Hopes SOURCE: Patient, family, records in the EMR, MRI images on PACS  _________________________________   HISTORICAL  CHIEF COMPLAINT:  Chief Complaint  Patient presents with  . Follow-up    HISTORY OF PRESENT ILLNESS:  Mike Lowery,is a 31 yo man with multiple sclerosis.     MS:   He had his 2 split doses of ocrelizumab on 08/05/2016 and 08/19/2016.    He switched to the ocrelizumab due to large exacerbation in early March 2018. MRI showed multiple enhancing lesions of the brain and 1 new lesion at C4.   He tolerated the ocrelizumab well. He feels much better since the infusion has not had any new symptoms and notes that some of the old symptoms improved   Spasms/spells:    He has tonic spasticity in his legs but no longer has the basic spasticity. He has no more spells of drawing up in the arm or leg.    Gait/strength/sensation: He feels he is walking better than he did earlier this year when he had an exacerbation. Gait was even worse last year and a 2016 due to his initial exacerbation.    He also notes painful dysesthesias involving the legs and left > right arms..   This has also improved over the past few months.   Gabapentin 3 x 300 mg daily helps  Baclofen is helping the tonic spasticity.    .    Bladder:    He has mild urinary frequency and urgency. There is no incontinence. His ED is better  Fatigue/sleep:    He notes fatigue that is cognitive more than physical. Adderall has helped. He also feels it has helped his focus. He does not take every day.   Mood:  Mood is much better this year than last year and he no longer takes Cymbalta.  Cognition:   He is doing better with improved attention and focus. He still has some word finding difficulties but these are not as bad as they were last year.  Adderall helps his cognitive skills and attention and helps his fatigue  and sleepiness a lot.  Tolerates the current dose well.  MS HISTORY:  In mid 2015, he had the onset of weakness and numbness in the arms and legs, a little worse on the left.   When symptoms persisted, he presented to [facility] emergency room. MRI was consistent with multiple sclerosis and he was admitted. The MRI of the brain showed several plaques, mostly in the periventricular white matter. The MRI of the cervical spine showed several large T2 hyperintense foci, with some enhancement. He was given IV Solu-Medrol and his symptoms improved quite a bit. He followed up with Dr. Everlena Cooper and was placed on Plegridy about 8 or 9 months after starting Plegridy, he had another exacerbation and MRI of the brain and cervical spine were performed again 02/21/2015. The MRI of the cervical spine does not show any definite new lesions. However, the MRI of the brain does show several new lesions including for small enhancing foci. He was switched to [facility] November or December 2016.   I have personally reviewed the MRIs of the brain and cervical spine from October 2016. The MRI of the brain shows multiple T2/FLAIR hyperintense foci consistent with MS in the hemispheres and brainstem.   4 of the hemispheric small foci enhanced after gadolinium administration. The MRI of the cervical spine shows  multiple hyperintense foci. There was a normal enhancement pattern.   He had a large exacerbation while on Tecfidera with new gait difficulties March 2018.   MRI's show multiple new lesions in the brain with several lesions enhancing including a larger one in the left periventrivcular white matter.    Also has a new focus adjacent to C4 in the spinal cord.  He was switched to ocrelizumab and had his first infusion at the end of March 2018   REVIEW OF SYSTEMS: Constitutional: No fevers, chills, sweats, or change in appetite.   He has fatigue and sleepiness. Eyes: No visual changes, double vision, eye pain Ear, nose and throat: No  hearing loss, ear pain, nasal congestion, sore throat Cardiovascular: No chest pain, palpitations Respiratory: No shortness of breath at rest or with exertion.   No wheezes GastrointestinaI: No nausea, vomiting, diarrhea, abdominal pain, fecal incontinence Genitourinary: He notes urinary frequency and ED.    Musculoskeletal: No neck pain, back pain Integumentary: No rash, pruritus, skin lesions Neurological: as above Psychiatric: See above Endocrine: No palpitations, diaphoresis, change in appetite, change in weigh or increased thirst Hematologic/Lymphatic: No anemia, purpura, petechiae. Allergic/Immunologic: No itchy/runny eyes, nasal congestion, recent allergic reactions, rashes  ALLERGIES: No Known Allergies  HOME MEDICATIONS:  Current Outpatient Prescriptions:  .  amphetamine-dextroamphetamine (ADDERALL XR) 20 MG 24 hr capsule, Take 1 capsule (20 mg total) by mouth daily., Disp: 30 capsule, Rfl: 0 .  baclofen (LIORESAL) 10 MG tablet, TAKE 1 TABLET EVERY MORNING, 1 IN THE EVENING, AND 2 AT BEDTIME FOR SPASTICITY, Disp: 120 tablet, Rfl: 10 .  Dimethyl Fumarate (TECFIDERA) 240 MG CPDR, Take 1 capsule (240 mg total) by mouth 2 (two) times daily., Disp: 60 capsule, Rfl: 11 .  diphenhydrAMINE (BENADRYL) 25 mg capsule, Take 25 mg by mouth every 6 (six) hours as needed (premed ocrevus)., Disp: , Rfl:  .  fluticasone (FLONASE) 50 MCG/ACT nasal spray, Place 1 spray into both nostrils daily., Disp: 16 g, Rfl: 0 .  gabapentin (NEURONTIN) 300 MG capsule, take 1 capsule by mouth EVERY MORNING, 1 EVERY EVENING AND 2 AT NIGHT FOR NERVE PAIN, Disp: 120 capsule, Rfl: 10 .  HYDROcodone-acetaminophen (NORCO/VICODIN) 5-325 MG tablet, Take one tablet daily as needed for pain.  Prescription must last 30 days, Disp: 30 tablet, Rfl: 0 .  ibuprofen (ADVIL,MOTRIN) 200 MG tablet, Take 400 mg by mouth every 6 (six) hours as needed for moderate pain. , Disp: , Rfl:  .  levETIRAcetam (KEPPRA) 1000 MG tablet,  Take 1 tablet in the morning and 1 tablet at night./fim (Patient taking differently: Take 500 mg by mouth 2 (two) times daily as needed (mood). ), Disp: 60 tablet, Rfl: 11 .  loratadine (CLARITIN) 10 MG tablet, Take 1 tablet (10 mg total) by mouth daily., Disp: 30 tablet, Rfl: 0 .  methylPREDNISolone sodium succinate (SOLU-MEDROL) 500 MG injection, Inject into the vein once. Premed ocrevus, Disp: , Rfl:  .  ocrelizumab in sodium chloride 0.9 % 500 mL, 300mg  IV on day 1, repeat 300mg  IV on day 15, Disp: 300 each, Rfl: 1 .  oxyCODONE-acetaminophen (PERCOCET/ROXICET) 5-325 MG tablet, Take 1-2 tablets by mouth every 4 (four) hours as needed for severe pain., Disp: 6 tablet, Rfl: 0  PAST MEDICAL HISTORY: Past Medical History:  Diagnosis Date  . Depression    situaltional  . Dyspnea   . Multiple sclerosis (HCC) 12/24/13  . Multiple sclerosis (HCC)   . Vision abnormalities     PAST SURGICAL HISTORY: Past  Surgical History:  Procedure Laterality Date  . NO PAST SURGERIES      FAMILY HISTORY: Family History  Problem Relation Age of Onset  . Healthy Mother   . Healthy Father   . Cancer Maternal Grandmother        unknown   . Ataxia Sister   . Multiple sclerosis Neg Hx     SOCIAL HISTORY:  Social History   Social History  . Marital status: Single    Spouse name: N/A  . Number of children: N/A  . Years of education: N/A   Occupational History  . Not on file.   Social History Main Topics  . Smoking status: Former Smoker    Types: Cigarettes    Quit date: 04/18/2013  . Smokeless tobacco: Former Neurosurgeon    Quit date: 09/08/2014  . Alcohol use Yes  . Drug use: No     Comment: former  . Sexual activity: Yes    Partners: Female   Other Topics Concern  . Not on file   Social History Narrative  . No narrative on file     PHYSICAL EXAM  Vitals:   12/21/16 1318  BP: 120/64  Pulse: 70  Weight: 149 lb (67.6 kg)  Height: 5\' 7"  (1.702 m)    Body mass index is 23.34  kg/m.   General: The patient is well-developed and well-nourished and in no acute distress   Skin: Extremities are without significant rash or edema.   Neurologic Exam  Mental status: The patient is alert and oriented x 3 at the time of the examination. The patient has apparent normal recent and remote memory, with a reduced attention span and concentration ability.   Speech is normal.  Cranial nerves: Extraocular movements are full.   There is good facial sensation to soft touch bilaterally.Facial strength is normal.  Trapezius and sternocleidomastoid strength is normal. No dysarthria is noted.  The tongue is midline, and the patient has symmetric elevation of the soft palate. No obvious hearing deficits are noted.  Motor:   Muscle bulk is normal.   Tone is increased in legs, , R=L. Strength is  5 / 5 in all 4 extremities.   Sensory:  Sensory examination, there is reduced touch in bilateral arms and legs compared to upper cervical levels. Sensation is slightly worse on the left than the right.  Coordination: On cerebellar testing he has good finger-nose-finger and mildly reduced heel-to-shin bilaterally.   Gait and station: Station is normal.   His gait is slightly spastic and he had a wide tandem gait. Romberg is positive.   Reflexes: Deep tendon reflexes are symmetric and 3 in arms.  In the legs, reflexes are increased with spread at the knees but no ankle clonus.    DIAGNOSTIC DATA (LABS, IMAGING, TESTING) - I reviewed patient records, labs, notes, testing and imaging myself where available.  Lab Results  Component Value Date   WBC 4.6 07/16/2016   HGB 14.3 07/16/2016   HCT 41.8 07/16/2016   MCV 83.9 07/16/2016   PLT 276 07/16/2016      Component Value Date/Time   NA 139 07/16/2016 1520   K 4.1 07/16/2016 1520   CL 106 07/16/2016 1520   CO2 27 07/16/2016 1520   GLUCOSE 117 (H) 07/16/2016 1520   BUN 12 07/16/2016 1520   CREATININE 0.81 07/16/2016 1520   CREATININE  0.92 03/27/2015 1130   CALCIUM 9.3 07/16/2016 1520   PROT 6.7 07/17/2016 1052   ALBUMIN 4.4 07/17/2016  1052   AST 12 07/17/2016 1052   ALT 18 07/17/2016 1052   ALKPHOS 69 07/17/2016 1052   BILITOT 0.6 07/17/2016 1052   GFRNONAA >60 07/16/2016 1520   GFRNONAA >89 10/10/2014 1251   GFRAA >60 07/16/2016 1520   GFRAA >89 10/10/2014 1251       ASSESSMENT AND PLAN  Multiple sclerosis (HCC) - Plan: CBC with Differential/Platelet, Hepatic function panel  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   He is doing well on ocrelizumab and will continue. His next infusion will be early October.   2.    Continue baclofen and gabapentin for symptoms.   Reminded that the gabapentin should be taken on a daily basis as it will be more effective for his pain when he does so. 3.   Renew Adderall for MS related reduced attention and sleepiness.  4.   Return to see me in 6 months or sooner if there are new or worsening symptoms.  43 minutes face-to-face evaluation with greater than one half of the time counseling and coordinating care about his new MS exacerbation, MRI changes and need to change his disease modifying therapy.  Richard A. Epimenio Foot, MD, PhD 12/21/2016, 1:33 PM Certified in Neurology, Clinical Neurophysiology, Sleep Medicine, Pain Medicine and Neuroimaging  [facility] [address] [place], [place] [address] [phone]

## 2016-12-22 LAB — HEPATIC FUNCTION PANEL
ALT: 22 IU/L (ref 0–44)
AST: 45 IU/L — ABNORMAL HIGH (ref 0–40)
Albumin: 4.6 g/dL (ref 3.5–5.5)
Alkaline Phosphatase: 87 IU/L (ref 39–117)
Bilirubin Total: 0.3 mg/dL (ref 0.0–1.2)
Bilirubin, Direct: 0.1 mg/dL (ref 0.00–0.40)
Total Protein: 6.7 g/dL (ref 6.0–8.5)

## 2016-12-22 LAB — CBC WITH DIFFERENTIAL/PLATELET
Basophils Absolute: 0 10*3/uL (ref 0.0–0.2)
Basos: 0 %
EOS (ABSOLUTE): 0.2 10*3/uL (ref 0.0–0.4)
Eos: 6 %
Hematocrit: 41.9 % (ref 37.5–51.0)
Hemoglobin: 14.2 g/dL (ref 13.0–17.7)
Immature Grans (Abs): 0 10*3/uL (ref 0.0–0.1)
Immature Granulocytes: 0 %
Lymphocytes Absolute: 1.2 10*3/uL (ref 0.7–3.1)
Lymphs: 39 %
MCH: 28.6 pg (ref 26.6–33.0)
MCHC: 33.9 g/dL (ref 31.5–35.7)
MCV: 84 fL (ref 79–97)
Monocytes Absolute: 0.4 10*3/uL (ref 0.1–0.9)
Monocytes: 12 %
Neutrophils Absolute: 1.4 10*3/uL (ref 1.4–7.0)
Neutrophils: 43 %
Platelets: 307 10*3/uL (ref 150–379)
RBC: 4.97 x10E6/uL (ref 4.14–5.80)
RDW: 13.2 % (ref 12.3–15.4)
WBC: 3.1 10*3/uL — ABNORMAL LOW (ref 3.4–10.8)

## 2017-01-27 ENCOUNTER — Telehealth: Payer: Self-pay | Admitting: Neurology

## 2017-01-27 MED ORDER — AMPHETAMINE-DEXTROAMPHET ER 20 MG PO CP24: 20.0000 mg | ORAL_CAPSULE | Freq: Every day | ORAL | 0 refills | Status: DC

## 2017-01-27 NOTE — Addendum Note (Signed)
Addended by: Candis Schatz I on: 01/27/2017 03:29 PM   Modules accepted: Orders

## 2017-01-27 NOTE — Telephone Encounter (Signed)
Rx. up front [facility]/fim 

## 2017-01-27 NOTE — Telephone Encounter (Signed)
Patient requesting refill of  amphetamine-dextroamphetamine (ADDERALL XR) 20 MG 24 hr capsule. ° ° °

## 2017-01-27 NOTE — Telephone Encounter (Signed)
Rx. awaiting [facility] sig/fim 

## 2017-02-08 ENCOUNTER — Other Ambulatory Visit: Payer: Self-pay | Admitting: Neurology

## 2017-02-08 MED FILL — OCREVUS 300 MG/10ML SOLN: 300 | 15 days supply | Qty: 20 | Fill #0

## 2017-02-19 ENCOUNTER — Ambulatory Visit (HOSPITAL_COMMUNITY)
Admission: RE | Admit: 2017-02-19 | Discharge: 2017-02-19 | Disposition: A | Discharge status: Home / Self Care | Payer: Self-pay | Source: Ambulatory Visit | Attending: Neurology | Admitting: Neurology

## 2017-03-08 ENCOUNTER — Telehealth: Payer: Self-pay | Admitting: Neurology

## 2017-03-08 MED ORDER — AMPHETAMINE-DEXTROAMPHET ER 20 MG PO CP24: 20.0000 mg | ORAL_CAPSULE | Freq: Every day | ORAL | 0 refills | Status: DC

## 2017-03-08 NOTE — Telephone Encounter (Signed)
Rx. awaiting [facility] sig/fim 

## 2017-03-08 NOTE — Telephone Encounter (Signed)
Rx. up front [facility]/fim 

## 2017-03-08 NOTE — Addendum Note (Signed)
Addended by: Candis Schatz I on: 03/08/2017 10:10 AM   Modules accepted: Orders

## 2017-03-08 NOTE — Addendum Note (Signed)
Addended by: Candis Schatz I on: 03/08/2017 08:58 AM   Modules accepted: Orders

## 2017-03-08 NOTE — Telephone Encounter (Signed)
Patient requesting refill of  amphetamine-dextroamphetamine (ADDERALL XR) 20 MG 24 hr capsule. ° ° °

## 2017-03-17 ENCOUNTER — Ambulatory Visit (HOSPITAL_COMMUNITY)
Admission: RE | Admit: 2017-03-17 | Discharge: 2017-03-17 | Disposition: A | Discharge status: Home / Self Care | Payer: Medicaid Other | Source: Ambulatory Visit | Attending: Neurology | Admitting: Neurology

## 2017-03-17 ENCOUNTER — Encounter (HOSPITAL_COMMUNITY): Payer: Self-pay

## 2017-03-17 DIAGNOSIS — G35 Multiple sclerosis: Secondary | ICD-10-CM | POA: Diagnosis present

## 2017-03-17 MED ORDER — SODIUM CHLORIDE 0.9 % IV SOLN
Freq: Once | INTRAVENOUS | Status: AC
  Administered 2017-03-17: 11:00:00 via INTRAVENOUS

## 2017-03-17 MED ORDER — DIPHENHYDRAMINE HCL 25 MG PO CAPS
50.0000 mg | ORAL_CAPSULE | Freq: Once | ORAL | Status: AC
  Administered 2017-03-17: 50 mg via ORAL
  Filled 2017-03-17: qty 2

## 2017-03-17 MED ORDER — SODIUM CHLORIDE 0.9 % IV SOLN
600.0000 mg | Freq: Once | INTRAVENOUS | Status: AC
  Administered 2017-03-17: 600 mg via INTRAVENOUS
  Filled 2017-03-17: qty 20

## 2017-03-17 MED ORDER — METHYLPREDNISOLONE SODIUM SUCC 1000 MG IJ SOLR
500.0000 mg | Freq: Once | INTRAMUSCULAR | Status: AC
  Administered 2017-03-17: 500 mg via INTRAVENOUS
  Filled 2017-03-17: qty 4

## 2017-03-17 NOTE — Discharge Instructions (Signed)
Ocrelizumab injection / Ocrevus infusion  What is this medicine? OCRELIZUMAB (ok re LIZ ue mab) treats multiple sclerosis. It helps to decrease the number of multiple sclerosis relapses. It is not a cure. This medicine may be used for other purposes; ask your health care provider or pharmacist if you have questions. COMMON BRAND NAME(S): OCREVUS What should I tell my health care provider before I take this medicine? They need to know if you have any of these conditions: -cancer -hepatitis B infection -other infection (especially a virus infection such as chickenpox, cold sores, or herpes) -an unusual or allergic reaction to ocrelizumab, other medicines, foods, dyes or preservatives -pregnant or trying to get pregnant -breast-feeding How should I use this medicine? This medicine is for infusion into a vein. It is given by a health care professional in a hospital or clinic setting. Talk to your pediatrician regarding the use of this medicine in children. Special care may be needed. Overdosage: If you think you have taken too much of this medicine contact a poison control center or emergency room at once. NOTE: This medicine is only for you. Do not share this medicine with others. What if I miss a dose? Keep appointments for follow-up doses as directed. It is important not to miss your dose. Call your doctor or health care professional if you are unable to keep an appointment. What may interact with this medicine? -alemtuzumab -daclizumab -dimethyl fumarate -fingolimod -glatiramer -interferon beta -live virus vaccines -mitoxantrone -natalizumab -peginterferon beta -rituximab -steroid medicines like prednisone or cortisone -teriflunomide This list may not describe all possible interactions. Give your health care provider a list of all the medicines, herbs, non-prescription drugs, or dietary supplements you use. Also tell them if you smoke, drink alcohol, or use illegal drugs. Some items  may interact with your medicine. What should I watch for while using this medicine? Tell your doctor or healthcare professional if your symptoms do not start to get better or if they get worse. This medicine can cause serious allergic reactions. To reduce your risk you may need to take medicine before treatment with this medicine. Take your medicine as directed. Women should inform their doctor if they wish to become pregnant or think they might be pregnant. There is a potential for serious side effects to an unborn child. Talk to your health care professional or pharmacist for more information. Male patients should use effective birth control methods while receiving this medicine and for 6 months after the last dose. Call your doctor or health care professional for advice if you get a fever, chills or sore throat, or other symptoms of a cold or flu. Do not treat yourself. This drug decreases your body's ability to fight infections. Try to avoid being around people who are sick. If you have a hepatitis B infection or a history of a hepatitis B infection, talk to your doctor. The symptoms of hepatitis B may get worse if you take this medicine. In some patients, this medicine may cause a serious brain infection that may cause death. If you have any problems seeing, thinking, speaking, walking, or standing, tell your doctor right away. If you cannot reach your doctor, urgently seek other source of medical care. This medicine can decrease the response to a vaccine. If you need to get vaccinated, tell your healthcare professional if you have received this medicine. Extra booster doses may be needed. Talk to your doctor to see if a different vaccination schedule is needed. Talk to your doctor about  your risk of cancer. You may be more at risk for certain types of cancers if you take this medicine. What side effects may I notice from receiving this medicine? Side effects that you should report to your doctor  or health care professional as soon as possible: -allergic reactions like skin rash, itching or hives, swelling of the face, lips, or tongue -breathing problems -facial flushing -fast, irregular heartbeat -lump or soreness in the breast -signs and symptoms of herpes such as cold sore, shingles, or genital sores -signs and symptoms of infection like fever or chills, cough, sore throat, pain or trouble passing urine -signs and symptoms of low blood pressure like dizziness; feeling faint or lightheaded, falls; unusually weak or tired -signs and symptoms of progressive multifocal leukoencephalopathy (PML) like changes in vision; clumsiness; confusion; personality changes; weakness on one side of the body -swelling of the ankles, feet, hands Side effects that usually do not require medical attention (report these to your doctor or health care professional if they continue or are bothersome): -back pain -depressed mood -diarrhea -pain, redness, or irritation at site where injected This list may not describe all possible side effects. Call your doctor for medical advice about side effects. You may report side effects to FDA at 1-800-FDA-1088. Where should I keep my medicine? This drug is given in a hospital or clinic and will not be stored at home. NOTE: This sheet is a summary. It may not cover all possible information. If you have questions about this medicine, talk to your doctor, pharmacist, or health care provider.  2018 Elsevier/Gold Standard (2015-08-20 09:40:25)

## 2017-05-03 ENCOUNTER — Telehealth: Payer: Self-pay | Admitting: Neurology

## 2017-05-03 MED ORDER — AMPHETAMINE-DEXTROAMPHET ER 20 MG PO CP24: 20.0000 mg | ORAL_CAPSULE | Freq: Every day | ORAL | 0 refills | Status: DC

## 2017-05-03 NOTE — Telephone Encounter (Signed)
Rx. awaiting [facility] sig/fim 

## 2017-05-03 NOTE — Telephone Encounter (Signed)
Pt calling for a refill of amphetamine-dextroamphetamine (ADDERALL XR) 20 MG 24 hr capsule °

## 2017-05-03 NOTE — Addendum Note (Signed)
Addended by: Candis Schatz I on: 05/03/2017 02:03 PM   Modules accepted: Orders

## 2017-05-03 NOTE — Telephone Encounter (Signed)
Rx. up front [facility]/fim 

## 2017-06-23 ENCOUNTER — Ambulatory Visit: Payer: Medicaid Other | Admitting: Neurology

## 2017-06-23 ENCOUNTER — Other Ambulatory Visit: Payer: Self-pay

## 2017-06-23 ENCOUNTER — Encounter: Payer: Self-pay | Admitting: Neurology

## 2017-06-23 VITALS — BP 130/65 | HR 76 | Resp 14 | Ht 67.0 in | Wt 148.5 lb

## 2017-06-23 DIAGNOSIS — F418 Other specified anxiety disorders: Secondary | ICD-10-CM

## 2017-06-23 DIAGNOSIS — R200 Anesthesia of skin: Secondary | ICD-10-CM | POA: Diagnosis not present

## 2017-06-23 DIAGNOSIS — R5383 Other fatigue: Secondary | ICD-10-CM | POA: Diagnosis not present

## 2017-06-23 DIAGNOSIS — G35 Multiple sclerosis: Secondary | ICD-10-CM

## 2017-06-23 DIAGNOSIS — R269 Unspecified abnormalities of gait and mobility: Secondary | ICD-10-CM | POA: Diagnosis not present

## 2017-06-23 DIAGNOSIS — R252 Cramp and spasm: Secondary | ICD-10-CM | POA: Diagnosis not present

## 2017-06-23 DIAGNOSIS — G3281 Cerebellar ataxia in diseases classified elsewhere: Secondary | ICD-10-CM | POA: Diagnosis not present

## 2017-06-23 DIAGNOSIS — Z79899 Other long term (current) drug therapy: Secondary | ICD-10-CM

## 2017-06-23 MED ORDER — AMPHETAMINE-DEXTROAMPHET ER 20 MG PO CP24: 20.0000 mg | ORAL_CAPSULE | Freq: Every day | ORAL | 0 refills | Status: DC

## 2017-06-23 NOTE — Progress Notes (Signed)
[facility]  PATIENT: Mike Lowery DOB: 1985/11/12  REFERRING DOCTOR OR PCP:  Dr. Hyman Hopes SOURCE: Patient, family, records in the EMR, MRI images on PACS  _________________________________   HISTORICAL  CHIEF COMPLAINT:  Chief Complaint  Patient presents with  . Multiple Sclerosis    Last Ocrevus infusion was 03/17/17.  Only complaint he has today is skin sensitivity base of neck/fim    HISTORY OF PRESENT ILLNESS:  Mike Lowery,is a 32 yo man with multiple sclerosis.     Update 06/23/2017: He feels gait is much better.   His balance is still poor but he does not need a cane.    He denies numbness or weakness now and no longer gets spasms   He denies any problems with bladder.    Vision is worse out of the right eye than the left.      He feels fatigue is helped a lot by Adderall.    He feels focus and attention are also better on Adderall.   Mood is doing better this year than last year. He is sleeping well at night.   From 12/21/2016: MS:   He had his 2 split doses of ocrelizumab on 08/05/2016 and 08/19/2016 and his second dose  03/17/2017.   He is doing much better. He tolerates the infusions well.    He switched to the ocrelizumab due to large exacerbation in early March 2018. MRI showed multiple enhancing lesions of the brain and 1 new lesion at C4.   He tolerated the ocrelizumab well. He feels much better since the infusion has not had any new symptoms and notes that some of the old symptoms improved   Spasms/spells:    He has tonic spasticity in his legs but no longer has the basic spasticity. He has no more spells of drawing up in the arm or leg.    Gait/strength/sensation: He feels he is walking better than he did earlier this year when he had an exacerbation. Gait was even worse last year and a 2016 due to his initial exacerbation.    He also notes painful dysesthesias involving the legs and left > right arms..   This has also improved over the past  few months.   Gabapentin 3 x 300 mg daily helps  Baclofen is helping the tonic spasticity.    .    Bladder:    He has mild urinary frequency and urgency. There is no incontinence. His ED is better  Fatigue/sleep:    He notes fatigue that is cognitive more than physical. Adderall has helped. He also feels it has helped his focus. He does not take every day.   Mood:  Mood is much better this year than last year and he no longer takes Cymbalta.  Cognition:   He is doing better with improved attention and focus. He still has some word finding difficulties but these are not as bad as they were last year.  Adderall helps his cognitive skills and attention and helps his fatigue and sleepiness a lot.  Tolerates the current dose well.  MS HISTORY:  In mid 2015, he had the onset of weakness and numbness in the arms and legs, a little worse on the left.   When symptoms persisted, he presented to [facility] emergency room. MRI was consistent with multiple sclerosis and he was admitted. The MRI of the brain showed several plaques, mostly in the periventricular white matter. The MRI of the cervical spine showed several large  T2 hyperintense foci, with some enhancement. He was given IV Solu-Medrol and his symptoms improved quite a bit. He followed up with Dr. Everlena Cooper and was placed on Plegridy about 8 or 9 months after starting Plegridy, he had another exacerbation and MRI of the brain and cervical spine were performed again 02/21/2015. The MRI of the cervical spine does not show any definite new lesions. However, the MRI of the brain does show several new lesions including for small enhancing foci. He was switched to [facility] November or December 2016.   I have personally reviewed the MRIs of the brain and cervical spine from October 2016. The MRI of the brain shows multiple T2/FLAIR hyperintense foci consistent with MS in the hemispheres and brainstem.   4 of the hemispheric small foci enhanced after gadolinium  administration. The MRI of the cervical spine shows multiple hyperintense foci. There was a normal enhancement pattern.   He had a large exacerbation while on Tecfidera with new gait difficulties March 2018.   MRI's show multiple new lesions in the brain with several lesions enhancing including a larger one in the left periventrivcular white matter.    Also has a new focus adjacent to C4 in the spinal cord.  He was switched to ocrelizumab and had his first infusion at the end of March 2018   REVIEW OF SYSTEMS: Constitutional: No fevers, chills, sweats, or change in appetite.   He has fatigue and sleepiness. Eyes: No visual changes, double vision, eye pain Ear, nose and throat: No hearing loss, ear pain, nasal congestion, sore throat Cardiovascular: No chest pain, palpitations Respiratory: No shortness of breath at rest or with exertion.   No wheezes GastrointestinaI: No nausea, vomiting, diarrhea, abdominal pain, fecal incontinence Genitourinary: He notes urinary frequency and ED.    Musculoskeletal: No neck pain, back pain Integumentary: No rash, pruritus, skin lesions Neurological: as above Psychiatric: See above Endocrine: No palpitations, diaphoresis, change in appetite, change in weigh or increased thirst Hematologic/Lymphatic: No anemia, purpura, petechiae. Allergic/Immunologic: No itchy/runny eyes, nasal congestion, recent allergic reactions, rashes  ALLERGIES: No Known Allergies  HOME MEDICATIONS:  Current Outpatient Medications:  .  amphetamine-dextroamphetamine (ADDERALL XR) 20 MG 24 hr capsule, Take 1 capsule (20 mg total) by mouth daily., Disp: 30 capsule, Rfl: 0 .  baclofen (LIORESAL) 10 MG tablet, TAKE 1 TABLET EVERY MORNING, 1 IN THE EVENING, AND 2 AT BEDTIME FOR SPASTICITY, Disp: 120 tablet, Rfl: 10 .  diphenhydrAMINE (BENADRYL) 25 mg capsule, Take 25 mg by mouth every 6 (six) hours as needed (premed ocrevus)., Disp: , Rfl:  .  fluticasone (FLONASE) 50 MCG/ACT nasal  spray, Place 1 spray into both nostrils daily., Disp: 16 g, Rfl: 0 .  gabapentin (NEURONTIN) 300 MG capsule, take 1 capsule by mouth EVERY MORNING, 1 EVERY EVENING AND 2 AT NIGHT FOR NERVE PAIN, Disp: 120 capsule, Rfl: 10 .  HYDROcodone-acetaminophen (NORCO/VICODIN) 5-325 MG tablet, Take one tablet daily as needed for pain.  Prescription must last 30 days, Disp: 30 tablet, Rfl: 0 .  ibuprofen (ADVIL,MOTRIN) 200 MG tablet, Take 400 mg by mouth every 6 (six) hours as needed for moderate pain. , Disp: , Rfl:  .  levETIRAcetam (KEPPRA) 1000 MG tablet, Take 1 tablet in the morning and 1 tablet at night./fim (Patient taking differently: Take 500 mg by mouth 2 (two) times daily as needed (mood). ), Disp: 60 tablet, Rfl: 11 .  loratadine (CLARITIN) 10 MG tablet, Take 1 tablet (10 mg total) by mouth daily., Disp: 30  tablet, Rfl: 0 .  methylPREDNISolone sodium succinate (SOLU-MEDROL) 500 MG injection, Inject into the vein once. Premed ocrevus, Disp: , Rfl:  .  OCREVUS 300 MG/10ML injection, 300MG  TO BE GIVEN IV ON DAY ONE, REPEAT ON DAY 15, Disp: 20 mL, Rfl: 0 .  oxyCODONE-acetaminophen (PERCOCET/ROXICET) 5-325 MG tablet, Take 1-2 tablets by mouth every 4 (four) hours as needed for severe pain., Disp: 6 tablet, Rfl: 0  PAST MEDICAL HISTORY: Past Medical History:  Diagnosis Date  . Depression    situaltional  . Dyspnea   . Multiple sclerosis (HCC) 12/24/13  . Multiple sclerosis (HCC)   . Vision abnormalities     PAST SURGICAL HISTORY: Past Surgical History:  Procedure Laterality Date  . NO PAST SURGERIES      FAMILY HISTORY: Family History  Problem Relation Age of Onset  . Healthy Mother   . Healthy Father   . Cancer Maternal Grandmother        unknown   . Ataxia Sister   . Multiple sclerosis Neg Hx     SOCIAL HISTORY:  Social History   Socioeconomic History  . Marital status: Single    Spouse name: Not on file  . Number of children: Not on file  . Years of education: Not on file  .  Highest education level: Not on file  Social Needs  . Financial resource strain: Not on file  . Food insecurity - worry: Not on file  . Food insecurity - inability: Not on file  . Transportation needs - medical: Not on file  . Transportation needs - non-medical: Not on file  Occupational History  . Not on file  Tobacco Use  . Smoking status: Former Smoker    Types: Cigarettes    Last attempt to quit: 04/18/2013    Years since quitting: 4.1  . Smokeless tobacco: Former Neurosurgeon    Quit date: 09/08/2014  Substance and Sexual Activity  . Alcohol use: Yes  . Drug use: No    Comment: former  . Sexual activity: Yes    Partners: Female  Other Topics Concern  . Not on file  Social History Narrative  . Not on file     PHYSICAL EXAM  Vitals:   06/23/17 1543  BP: 130/65  Pulse: 76  Resp: 14  Weight: 148 lb 8 oz (67.4 kg)  Height: 5\' 7"  (1.702 m)    Body mass index is 23.26 kg/m.   General: The patient is well-developed and well-nourished and in no acute distress   Skin: Extremities are without significant rash or edema.   Neurologic Exam  Mental status: The patient is alert and oriented x 3 at the time of the examination. The patient has apparent normal recent and remote memory, with a reduced attention span and concentration ability.   Speech is normal.  Cranial nerves: Extraocular movements are full.   There is good facial sensation to soft touch bilaterally.Facial strength is normal.  Trapezius and sternocleidomastoid strength is normal. No dysarthria is noted.  The tongue is midline, and the patient has symmetric elevation of the soft palate. No obvious hearing deficits are noted.  Motor:   Muscle bulk is normal.   Muscle tone is increased in the legs but strength is 5/5   Sensory:  He reports normal and symmetric sensation to touch and vibration in the arms or legs..  Coordination: On cerebellar testing he has good finger-nose-finger and mildly reduced heel-to-shin  bilaterally.   Gait and station: Station is normal.  The gait is slightly spastic and the tandem gait is mildly wide.. Romberg is now borderline..   Reflexes: Deep tendon reflexes are symmetric and 3 in arms.  In the legs, reflexes are increased with spread at the knees but no ankle clonus.    DIAGNOSTIC DATA (LABS, IMAGING, TESTING) - I reviewed patient records, labs, notes, testing and imaging myself where available.  Lab Results  Component Value Date   WBC 3.1 (L) 12/21/2016   HGB 14.2 12/21/2016   HCT 41.9 12/21/2016   MCV 84 12/21/2016   PLT 307 12/21/2016      Component Value Date/Time   NA 139 07/16/2016 1520   K 4.1 07/16/2016 1520   CL 106 07/16/2016 1520   CO2 27 07/16/2016 1520   GLUCOSE 117 (H) 07/16/2016 1520   BUN 12 07/16/2016 1520   CREATININE 0.81 07/16/2016 1520   CREATININE 0.92 03/27/2015 1130   CALCIUM 9.3 07/16/2016 1520   PROT 6.7 12/21/2016 1343   ALBUMIN 4.6 12/21/2016 1343   AST 45 (H) 12/21/2016 1343   ALT 22 12/21/2016 1343   ALKPHOS 87 12/21/2016 1343   BILITOT 0.3 12/21/2016 1343   GFRNONAA >60 07/16/2016 1520   GFRNONAA >89 10/10/2014 1251   GFRAA >60 07/16/2016 1520   GFRAA >89 10/10/2014 1251       ASSESSMENT AND PLAN  Multiple sclerosis (HCC) - Plan: CBC with Differential/Platelet, IgG, IgA, IgM, MR BRAIN W WO CONTRAST, MR CERVICAL SPINE W WO CONTRAST  Cerebellar ataxia in diseases classified elsewhere (HCC) - Plan: MR BRAIN W WO CONTRAST  Other fatigue  Gait disturbance  Numbness  Depression with anxiety  Spasticity  High risk medication use - Plan: CBC with Differential/Platelet, IgG, IgA, IgM    1.   Continue ocrelizumab for MS. His next infusion will be around end of April.  Check CBC with differential and IgG/M/A.   2.    Continue  Adderall for MS related attentional deficit and fatigue.  3.   Continue other medications for spasticity.  4.   Return to see me in 6 months or sooner if there are new or worsening  symptoms.  43 minutes face-to-face evaluation with greater than one half of the time counseling and coordinating care about his new MS exacerbation, MRI changes and need to change his disease modifying therapy.  Peggyann Zwiefelhofer A. Epimenio Foot, MD, PhD 06/23/2017, 4:05 PM Certified in Neurology, Clinical Neurophysiology, Sleep Medicine, Pain Medicine and Neuroimaging  [facility] [address], [address] [place], [place] [address] [phone]

## 2017-06-24 LAB — CBC WITH DIFFERENTIAL/PLATELET
BASOS ABS: 0 10*3/uL (ref 0.0–0.2)
BASOS: 1 %
EOS (ABSOLUTE): 0.1 10*3/uL (ref 0.0–0.4)
EOS: 2 %
HEMATOCRIT: 41 % (ref 37.5–51.0)
HEMOGLOBIN: 14 g/dL (ref 13.0–17.7)
IMMATURE GRANS (ABS): 0 10*3/uL (ref 0.0–0.1)
Immature Granulocytes: 0 %
LYMPHS ABS: 1.4 10*3/uL (ref 0.7–3.1)
LYMPHS: 34 %
MCH: 29.3 pg (ref 26.6–33.0)
MCHC: 34.1 g/dL (ref 31.5–35.7)
MCV: 86 fL (ref 79–97)
Monocytes Absolute: 0.5 10*3/uL (ref 0.1–0.9)
Monocytes: 12 %
NEUTROS ABS: 2 10*3/uL (ref 1.4–7.0)
Neutrophils: 51 %
Platelets: 347 10*3/uL (ref 150–379)
RBC: 4.78 x10E6/uL (ref 4.14–5.80)
RDW: 13.9 % (ref 12.3–15.4)
WBC: 4 10*3/uL (ref 3.4–10.8)

## 2017-06-24 LAB — IGG, IGA, IGM
IgA/Immunoglobulin A, Serum: 97 mg/dL (ref 90–386)
IgG (Immunoglobin G), Serum: 950 mg/dL (ref 700–1600)
IgM (Immunoglobulin M), Srm: 30 mg/dL (ref 20–172)

## 2017-07-14 ENCOUNTER — Telehealth: Payer: Self-pay | Admitting: Neurology

## 2017-07-14 NOTE — Telephone Encounter (Signed)
Pt said his girlfriend rec'd a call Friday reg his labs. He does not know who called. I don't see any documentation,( maybe intrafusion), he is asking if RN will call him back to discuss the labs.

## 2017-07-14 NOTE — Telephone Encounter (Signed)
Spoke with [facility] and reviewed recent lab results with him/fim

## 2017-08-04 ENCOUNTER — Telehealth: Payer: Self-pay | Admitting: Neurology

## 2017-08-04 MED ORDER — AMPHETAMINE-DEXTROAMPHET ER 20 MG PO CP24: 20.0000 mg | ORAL_CAPSULE | Freq: Every day | ORAL | 0 refills | Status: DC

## 2017-08-04 NOTE — Telephone Encounter (Signed)
Rx. up front [facility]/fim 

## 2017-08-04 NOTE — Telephone Encounter (Signed)
Rx. awaiting [facility] sig/fim 

## 2017-08-04 NOTE — Telephone Encounter (Signed)
Pt request refill for amphetamine-dextroamphetamine (ADDERALL XR) 20 MG 24 hr capsule °

## 2017-09-06 ENCOUNTER — Other Ambulatory Visit: Payer: Self-pay | Admitting: Neurology

## 2017-09-08 ENCOUNTER — Telehealth: Payer: Self-pay | Admitting: *Deleted

## 2017-09-08 MED FILL — OCREVUS 300 MG/10ML SOLN: 300 | 15 days supply | Qty: 20 | Fill #0

## 2017-09-08 NOTE — Telephone Encounter (Signed)
Ocrevus PA approved by [facility] Medicaid [id number]).  JW#[id number].  Valid from 09/08/2017 through 09/03/2018.

## 2017-09-15 ENCOUNTER — Encounter (HOSPITAL_COMMUNITY): Payer: Medicaid Other

## 2017-10-15 ENCOUNTER — Encounter (HOSPITAL_COMMUNITY)
Admission: RE | Admit: 2017-10-15 | Discharge: 2017-10-15 | Disposition: A | Discharge status: Home / Self Care | Payer: Medicaid Other | Source: Ambulatory Visit | Attending: Neurology | Admitting: Neurology

## 2017-10-15 ENCOUNTER — Encounter (HOSPITAL_COMMUNITY): Payer: Self-pay

## 2017-10-15 DIAGNOSIS — G35 Multiple sclerosis: Secondary | ICD-10-CM | POA: Insufficient documentation

## 2017-10-15 MED ORDER — SODIUM CHLORIDE 0.9 % IV SOLN
500.0000 mg | Freq: Once | INTRAVENOUS | Status: AC
  Administered 2017-10-15: 500 mg via INTRAVENOUS
  Filled 2017-10-15: qty 4

## 2017-10-15 MED ORDER — DIPHENHYDRAMINE HCL 25 MG PO CAPS
ORAL_CAPSULE | ORAL | Status: AC
  Administered 2017-10-15: 50 mg via ORAL
  Filled 2017-10-15: qty 2

## 2017-10-15 MED ORDER — DIPHENHYDRAMINE HCL 50 MG/ML IJ SOLN
50.0000 mg | Freq: Once | INTRAMUSCULAR | Status: DC
  Filled 2017-10-15: qty 1

## 2017-10-15 MED ORDER — DIPHENHYDRAMINE HCL 25 MG PO CAPS
50.0000 mg | ORAL_CAPSULE | Freq: Once | ORAL | Status: AC
Start: 2017-10-15 — End: 2017-10-15
  Administered 2017-10-15: 50 mg via ORAL

## 2017-10-15 MED ORDER — SODIUM CHLORIDE 0.9 % IV SOLN
Freq: Once | INTRAVENOUS | Status: AC
  Administered 2017-10-15: 07:00:00 via INTRAVENOUS

## 2017-10-15 MED ORDER — SODIUM CHLORIDE 0.9 % IV SOLN
600.0000 mg | Freq: Once | INTRAVENOUS | Status: AC
  Administered 2017-10-15: 600 mg via INTRAVENOUS
  Filled 2017-10-15: qty 20

## 2017-10-15 NOTE — Discharge Instructions (Signed)
Ocrelizumab injection What is this medicine? OCRELIZUMAB (ok re LIZ ue mab) treats multiple sclerosis. It helps to decrease the number of multiple sclerosis relapses. It is not a cure. This medicine may be used for other purposes; ask your health care provider or pharmacist if you have questions. COMMON BRAND NAME(S): OCREVUS What should I tell my health care provider before I take this medicine? They need to know if you have any of these conditions: -cancer -hepatitis B infection -other infection (especially a virus infection such as chickenpox, cold sores, or herpes) -an unusual or allergic reaction to ocrelizumab, other medicines, foods, dyes or preservatives -pregnant or trying to get pregnant -breast-feeding How should I use this medicine? This medicine is for infusion into a vein. It is given by a health care professional in a hospital or clinic setting. Talk to your pediatrician regarding the use of this medicine in children. Special care may be needed. Overdosage: If you think you have taken too much of this medicine contact a poison control center or emergency room at once. NOTE: This medicine is only for you. Do not share this medicine with others. What if I miss a dose? Keep appointments for follow-up doses as directed. It is important not to miss your dose. Call your doctor or health care professional if you are unable to keep an appointment. What may interact with this medicine? -alemtuzumab -daclizumab -dimethyl fumarate -fingolimod -glatiramer -interferon beta -live virus vaccines -mitoxantrone -natalizumab -peginterferon beta -rituximab -steroid medicines like prednisone or cortisone -teriflunomide This list may not describe all possible interactions. Give your health care provider a list of all the medicines, herbs, non-prescription drugs, or dietary supplements you use. Also tell them if you smoke, drink alcohol, or use illegal drugs. Some items may interact with  your medicine. What should I watch for while using this medicine? Tell your doctor or healthcare professional if your symptoms do not start to get better or if they get worse. This medicine can cause serious allergic reactions. To reduce your risk you may need to take medicine before treatment with this medicine. Take your medicine as directed. Women should inform their doctor if they wish to become pregnant or think they might be pregnant. There is a potential for serious side effects to an unborn child. Talk to your health care professional or pharmacist for more information. Male patients should use effective birth control methods while receiving this medicine and for 6 months after the last dose. Call your doctor or health care professional for advice if you get a fever, chills or sore throat, or other symptoms of a cold or flu. Do not treat yourself. This drug decreases your body's ability to fight infections. Try to avoid being around people who are sick. If you have a hepatitis B infection or a history of a hepatitis B infection, talk to your doctor. The symptoms of hepatitis B may get worse if you take this medicine. In some patients, this medicine may cause a serious brain infection that may cause death. If you have any problems seeing, thinking, speaking, walking, or standing, tell your doctor right away. If you cannot reach your doctor, urgently seek other source of medical care. This medicine can decrease the response to a vaccine. If you need to get vaccinated, tell your healthcare professional if you have received this medicine. Extra booster doses may be needed. Talk to your doctor to see if a different vaccination schedule is needed. Talk to your doctor about your risk of cancer.   You may be more at risk for certain types of cancers if you take this medicine. What side effects may I notice from receiving this medicine? Side effects that you should report to your doctor or health care  professional as soon as possible: -allergic reactions like skin rash, itching or hives, swelling of the face, lips, or tongue -breathing problems -facial flushing -fast, irregular heartbeat -lump or soreness in the breast -signs and symptoms of herpes such as cold sore, shingles, or genital sores -signs and symptoms of infection like fever or chills, cough, sore throat, pain or trouble passing urine -signs and symptoms of low blood pressure like dizziness; feeling faint or lightheaded, falls; unusually weak or tired -signs and symptoms of progressive multifocal leukoencephalopathy (PML) like changes in vision; clumsiness; confusion; personality changes; weakness on one side of the body -swelling of the ankles, feet, hands Side effects that usually do not require medical attention (report these to your doctor or health care professional if they continue or are bothersome): -back pain -depressed mood -diarrhea -pain, redness, or irritation at site where injected This list may not describe all possible side effects. Call your doctor for medical advice about side effects. You may report side effects to FDA at 1-800-FDA-1088. Where should I keep my medicine? This drug is given in a hospital or clinic and will not be stored at home. NOTE: This sheet is a summary. It may not cover all possible information. If you have questions about this medicine, talk to your doctor, pharmacist, or health care provider.  2018 Elsevier/Gold Standard (2015-08-20 09:40:25)  

## 2017-11-23 ENCOUNTER — Other Ambulatory Visit: Payer: Self-pay | Admitting: Neurology

## 2017-11-23 MED ORDER — AMPHETAMINE-DEXTROAMPHET ER 20 MG PO CP24: 20.0000 mg | ORAL_CAPSULE | Freq: Every day | ORAL | 0 refills | Status: DC

## 2017-11-23 NOTE — Telephone Encounter (Signed)
Pt request refill for amphetamine-dextroamphetamine (ADDERALL XR) 20 MG 24 hr capsule sent to [facility]/[address]

## 2017-12-01 ENCOUNTER — Telehealth: Payer: Self-pay | Admitting: Neurology

## 2017-12-01 MED ORDER — HYDROCODONE-ACETAMINOPHEN 5-325 MG PO TABS: 1.0000 | ORAL_TABLET | Freq: Three times a day (TID) | ORAL | 0 refills | Status: DC | PRN

## 2017-12-01 NOTE — Telephone Encounter (Signed)
Spoke with [employer].  RAS has never rx'd Oxycodone for him.  He has rx'd Hydrocodone 5/325mg  in the past for his chronic pain (MS related spasticity and spasms).  [place] Drug registry checked. Pt. not getting any controlled substances from other providers.  Per v/o [facility], ok to fill Hydrocodone 5/325mg  #30, one po Q8hrs prn pain.  LMOM for pt. letting him know RAS will fill this for him and rx. will be available to be picked up in the office by 0930 tomorrow/fim

## 2017-12-01 NOTE — Telephone Encounter (Signed)
Pt request refill for oxyCODONE-acetaminophen (PERCOCET/ROXICET) 5-325 MG tablet sent to [facility]/[address]

## 2017-12-02 NOTE — Telephone Encounter (Signed)
Rx. up front [facility]/fim 

## 2017-12-22 ENCOUNTER — Telehealth: Payer: Self-pay | Admitting: Neurology

## 2017-12-22 ENCOUNTER — Encounter: Payer: Self-pay | Admitting: Neurology

## 2017-12-22 ENCOUNTER — Other Ambulatory Visit: Payer: Self-pay

## 2017-12-22 ENCOUNTER — Ambulatory Visit: Payer: Medicare Other | Admitting: Neurology

## 2017-12-22 VITALS — BP 110/64 | HR 68 | Resp 16 | Ht 67.0 in | Wt 153.5 lb

## 2017-12-22 DIAGNOSIS — R269 Unspecified abnormalities of gait and mobility: Secondary | ICD-10-CM

## 2017-12-22 DIAGNOSIS — G35 Multiple sclerosis: Secondary | ICD-10-CM | POA: Diagnosis not present

## 2017-12-22 DIAGNOSIS — R5383 Other fatigue: Secondary | ICD-10-CM | POA: Diagnosis not present

## 2017-12-22 DIAGNOSIS — Z79899 Other long term (current) drug therapy: Secondary | ICD-10-CM | POA: Diagnosis not present

## 2017-12-22 DIAGNOSIS — D849 Immunodeficiency, unspecified: Secondary | ICD-10-CM

## 2017-12-22 DIAGNOSIS — R252 Cramp and spasm: Secondary | ICD-10-CM | POA: Diagnosis not present

## 2017-12-22 DIAGNOSIS — F32A Depression, unspecified: Secondary | ICD-10-CM

## 2017-12-22 DIAGNOSIS — F329 Major depressive disorder, single episode, unspecified: Secondary | ICD-10-CM

## 2017-12-22 DIAGNOSIS — Z114 Encounter for screening for human immunodeficiency virus [HIV]: Secondary | ICD-10-CM

## 2017-12-22 DIAGNOSIS — Z7251 High risk heterosexual behavior: Secondary | ICD-10-CM | POA: Insufficient documentation

## 2017-12-22 DIAGNOSIS — D899 Disorder involving the immune mechanism, unspecified: Secondary | ICD-10-CM | POA: Diagnosis not present

## 2017-12-22 MED ORDER — AMPHETAMINE-DEXTROAMPHET ER 20 MG PO CP24: 20.0000 mg | ORAL_CAPSULE | Freq: Every day | ORAL | 0 refills | Status: DC

## 2017-12-22 NOTE — Telephone Encounter (Signed)
medicare/medicaid order sent to [facility]. No auth they will reach out to the pt to schedule.  °

## 2017-12-22 NOTE — Progress Notes (Signed)
[facility]  PATIENT: Mike Lowery DOB: June 14, 1985  REFERRING DOCTOR OR PCP:  Dr. Hyman Hopes SOURCE: Patient, family, records in the EMR, MRI images on PACS  _________________________________   HISTORICAL  CHIEF COMPLAINT:  Chief Complaint  Patient presents with  . Multiple Sclerosis    Sts. he continues to tolerate Ocrevus well.  A little more spasticity in legs./fim    HISTORY OF PRESENT ILLNESS:  Mike Lowery,is a 32 y.o. man with multiple sclerosis.     Update 12/22/2017: He is on ocrelizumab and is tolerating it well.    His last infusion was in March 2019.      No exacerbatins or new symptoms.   His last brain MRI was March 2018 and it had shown progression compared to the 2016 MRI.  Some of the lesions were enhancing.  He also had a new cervical spinal cord lesion C3-C4 without enhancement.   Blood work at the last visit showed normal IgG/IgM.  He feels his balance is off.   Gait is spastic but still much better than 2 years ago.    He has some spasticity but no longer gets the phasic spasms.    He has started drinking again and continues to use marijuana.    We discussed that those may worsen his balance and increase risk of falls.   Bladder function is doing well.    Ed is less of a problem.    He has had unprotected sex with multiple partners.  He notes fatigue but less than last year.  MJ worsens his fatigue.   Adderall has helped.        Update 06/23/2017: He feels gait is much better.   His balance is still poor but he does not need a cane.    He denies numbness or weakness now and no longer gets spasms   He denies any problems with bladder.    Vision is worse out of the right eye than the left.      He feels fatigue is helped a lot by Adderall.    He feels focus and attention are also better on Adderall.   Mood is doing better this year than last year. He is sleeping well at night.   From 12/21/2016: MS:   He had his 2 split doses of ocrelizumab  on 08/05/2016 and 08/19/2016 and his second dose  03/17/2017.   He is doing much better. He tolerates the infusions well.    He switched to the ocrelizumab due to large exacerbation in early March 2018. MRI showed multiple enhancing lesions of the brain and 1 new lesion at C4.   He tolerated the ocrelizumab well. He feels much better since the infusion has not had any new symptoms and notes that some of the old symptoms improved   Spasms/spells:    He has tonic spasticity in his legs but no longer has the phasic spasticity. He has no more spells of drawing up in the arm or leg.    Gait/strength/sensation: He feels he is walking better than he did earlier this year when he had an exacerbation. Gait was even worse last year and a 2016 due to his initial exacerbation.    He also notes painful dysesthesias involving the legs and left > right arms..   This has also improved over the past few months.   Gabapentin 3 x 300 mg daily helps  Baclofen is helping the tonic spasticity.    Marland Kitchen  Bladder:    He has mild urinary frequency and urgency. There is no incontinence. His ED is better  Fatigue/sleep:    He notes fatigue that is cognitive more than physical. Adderall has helped. He also feels it has helped his focus. He does not take every day.   Mood:  Mood is much better this year than last year and he no longer takes Cymbalta.  Cognition:   He is doing better with improved attention and focus. He still has some word finding difficulties but these are not as bad as they were last year.  Adderall helps his cognitive skills and attention and helps his fatigue and sleepiness a lot.  Tolerates the current dose well.  MS HISTORY:  In mid 2015, he had the onset of weakness and numbness in the arms and legs, a little worse on the left.   When symptoms persisted, he presented to [facility] emergency room. MRI was consistent with multiple sclerosis and he was admitted. The MRI of the brain showed several plaques, mostly  in the periventricular white matter. The MRI of the cervical spine showed several large T2 hyperintense foci, with some enhancement. He was given IV Solu-Medrol and his symptoms improved quite a bit. He followed up with Dr. Everlena Cooper and was placed on Plegridy about 8 or 9 months after starting Plegridy, he had another exacerbation and MRI of the brain and cervical spine were performed again 02/21/2015. The MRI of the cervical spine does not show any definite new lesions. However, the MRI of the brain does show several new lesions including for small enhancing foci. He was switched to [facility] November or December 2016.   I have personally reviewed the MRIs of the brain and cervical spine from October 2016. The MRI of the brain shows multiple T2/FLAIR hyperintense foci consistent with MS in the hemispheres and brainstem.   4 of the hemispheric small foci enhanced after gadolinium administration. The MRI of the cervical spine shows multiple hyperintense foci. There was a normal enhancement pattern.   He had a large exacerbation while on Tecfidera with new gait difficulties March 2018.   MRI's show multiple new lesions in the brain with several lesions enhancing including a larger one in the left periventrivcular white matter.    Also has a new focus adjacent to C4 in the spinal cord.  He was switched to ocrelizumab and had his first infusion at the end of March 2018   REVIEW OF SYSTEMS: Constitutional: No fevers, chills, sweats, or change in appetite.   He has fatigue and sleepiness. Eyes: No visual changes, double vision, eye pain Ear, nose and throat: No hearing loss, ear pain, nasal congestion, sore throat Cardiovascular: No chest pain, palpitations Respiratory: No shortness of breath at rest or with exertion.   No wheezes GastrointestinaI: No nausea, vomiting, diarrhea, abdominal pain, fecal incontinence Genitourinary: He notes urinary frequency and ED.    Musculoskeletal: No neck pain, back  pain Integumentary: No rash, pruritus, skin lesions Neurological: as above Psychiatric: See above Endocrine: No palpitations, diaphoresis, change in appetite, change in weigh or increased thirst Hematologic/Lymphatic: No anemia, purpura, petechiae. Allergic/Immunologic: No itchy/runny eyes, nasal congestion, recent allergic reactions, rashes  ALLERGIES: No Known Allergies  HOME MEDICATIONS:  Current Outpatient Medications:  .  amphetamine-dextroamphetamine (ADDERALL XR) 20 MG 24 hr capsule, Take 1 capsule (20 mg total) by mouth daily., Disp: 30 capsule, Rfl: 0 .  baclofen (LIORESAL) 10 MG tablet, TAKE 1 TABLET EVERY MORNING, 1 IN THE EVENING,  AND 2 AT BEDTIME FOR SPASTICITY, Disp: 120 tablet, Rfl: 10 .  HYDROcodone-acetaminophen (NORCO/VICODIN) 5-325 MG tablet, Take 1 tablet by mouth every 8 (eight) hours as needed for moderate pain., Disp: 30 tablet, Rfl: 0 .  ibuprofen (ADVIL,MOTRIN) 200 MG tablet, Take 400 mg by mouth every 6 (six) hours as needed for moderate pain. , Disp: , Rfl:  .  OCREVUS 300 MG/10ML injection, 300MG  TO BE GIVEN IV ON DAY ONE, REPEAT ON DAY 15, Disp: 20 mL, Rfl: 0  PAST MEDICAL HISTORY: Past Medical History:  Diagnosis Date  . Depression    situaltional  . Dyspnea   . Multiple sclerosis (HCC) 12/24/13  . Multiple sclerosis (HCC)   . Vision abnormalities     PAST SURGICAL HISTORY: Past Surgical History:  Procedure Laterality Date  . NO PAST SURGERIES      FAMILY HISTORY: Family History  Problem Relation Age of Onset  . Healthy Mother   . Healthy Father   . Cancer Maternal Grandmother        unknown   . Ataxia Sister   . Multiple sclerosis Neg Hx     SOCIAL HISTORY:  Social History   Socioeconomic History  . Marital status: Single    Spouse name: Not on file  . Number of children: Not on file  . Years of education: Not on file  . Highest education level: Not on file  Occupational History  . Not on file  Social Needs  . Financial  resource strain: Not on file  . Food insecurity:    Worry: Not on file    Inability: Not on file  . Transportation needs:    Medical: Not on file    Non-medical: Not on file  Tobacco Use  . Smoking status: Former Smoker    Types: Cigarettes    Last attempt to quit: 04/18/2013    Years since quitting: 4.6  . Smokeless tobacco: Former Neurosurgeon    Quit date: 09/08/2014  Substance and Sexual Activity  . Alcohol use: Yes  . Drug use: No    Types: Marijuana    Comment: former  . Sexual activity: Yes    Partners: Female  Lifestyle  . Physical activity:    Days per week: Not on file    Minutes per session: Not on file  . Stress: Not on file  Relationships  . Social connections:    Talks on phone: Not on file    Gets together: Not on file    Attends religious service: Not on file    Active member of club or organization: Not on file    Attends meetings of clubs or organizations: Not on file    Relationship status: Not on file  . Intimate partner violence:    Fear of current or ex partner: Not on file    Emotionally abused: Not on file    Physically abused: Not on file    Forced sexual activity: Not on file  Other Topics Concern  . Not on file  Social History Narrative  . Not on file     PHYSICAL EXAM  Vitals:   12/22/17 1532  BP: 110/64  Pulse: 68  Resp: 16  Weight: 153 lb 8 oz (69.6 kg)  Height: 5\' 7"  (1.702 m)    Body mass index is 24.04 kg/m.   General: The patient is well-developed and well-nourished and in no acute distress   Skin: Extremities are without significant rash or edema.   Neurologic Exam  Mental status: The patient is alert and oriented x 3 at the time of the examination. The patient has apparent normal recent and remote memory, with a reduced attention span and concentration ability.   Speech is normal.  Cranial nerves: Extraocular movements are full.  Facial strength and sensation is normal.  Trapezius strength is normal..  The tongue is  midline, and the patient has symmetric elevation of the soft palate. No obvious hearing deficits are noted.  Motor:   Muscle bulk is normal.   Muscle tone is increased in the legs but strength is 5/5   Sensory:  He reports normal and symmetric sensation to touch and vibration in the arms or legs..  Coordination: On cerebellar testing finger-nose-finger is normal.  He has reduced heel-to-shin.  Gait and station: Station is normal.   His gait is normal base but is mildly spastic.  He turns in 3 steps.  His tandem gait is wide.. Romberg is now borderline..   Reflexes: Deep tendon reflexes are symmetric and 3 in arms.  There are increased reflexes in the legs with crossed abductors at the knees.  There is no ankle clonus.    DIAGNOSTIC DATA (LABS, IMAGING, TESTING) - I reviewed patient records, labs, notes, testing and imaging myself where available.  Lab Results  Component Value Date   WBC 4.0 06/23/2017   HGB 14.0 06/23/2017   HCT 41.0 06/23/2017   MCV 86 06/23/2017   PLT 347 06/23/2017      Component Value Date/Time   NA 139 07/16/2016 1520   K 4.1 07/16/2016 1520   CL 106 07/16/2016 1520   CO2 27 07/16/2016 1520   GLUCOSE 117 (H) 07/16/2016 1520   BUN 12 07/16/2016 1520   CREATININE 0.81 07/16/2016 1520   CREATININE 0.92 03/27/2015 1130   CALCIUM 9.3 07/16/2016 1520   PROT 6.7 12/21/2016 1343   ALBUMIN 4.6 12/21/2016 1343   AST 45 (H) 12/21/2016 1343   ALT 22 12/21/2016 1343   ALKPHOS 87 12/21/2016 1343   BILITOT 0.3 12/21/2016 1343   GFRNONAA >60 07/16/2016 1520   GFRNONAA >89 10/10/2014 1251   GFRAA >60 07/16/2016 1520   GFRAA >89 10/10/2014 1251       ASSESSMENT AND PLAN  Multiple sclerosis (HCC) - Plan: CBC with Differential/Platelet, Hepatic function panel, MR BRAIN W WO CONTRAST  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   Continue ocrelizumab for MS. His next infusion will be around end of April.  Check CBC with differential, LFT and HIV  2.    Renew  Adderall for MS related attentional deficit and fatigue.  3.   Continue baclofen for spasticity 4.   Return to see me in 6 months or sooner if there are new or worsening symptoms.  Emilian Stawicki A. Epimenio Foot, MD, PhD 12/22/2017, 3:56 PM Certified in Neurology, Clinical Neurophysiology, Sleep Medicine, Pain Medicine and Neuroimaging  [facility] [address] [place], [place] [address] [phone]

## 2017-12-23 ENCOUNTER — Telehealth: Payer: Self-pay | Admitting: *Deleted

## 2017-12-23 LAB — CBC WITH DIFFERENTIAL/PLATELET
BASOS: 1 %
Basophils Absolute: 0 10*3/uL (ref 0.0–0.2)
EOS (ABSOLUTE): 0.1 10*3/uL (ref 0.0–0.4)
EOS: 3 %
HEMATOCRIT: 43.3 % (ref 37.5–51.0)
Hemoglobin: 14.6 g/dL (ref 13.0–17.7)
Immature Grans (Abs): 0 10*3/uL (ref 0.0–0.1)
Immature Granulocytes: 0 %
LYMPHS ABS: 1.4 10*3/uL (ref 0.7–3.1)
Lymphs: 33 %
MCH: 29.5 pg (ref 26.6–33.0)
MCHC: 33.7 g/dL (ref 31.5–35.7)
MCV: 88 fL (ref 79–97)
Monocytes Absolute: 0.5 10*3/uL (ref 0.1–0.9)
Monocytes: 12 %
NEUTROS PCT: 51 %
Neutrophils Absolute: 2.3 10*3/uL (ref 1.4–7.0)
PLATELETS: 292 10*3/uL (ref 150–450)
RBC: 4.95 x10E6/uL (ref 4.14–5.80)
RDW: 12.9 % (ref 12.3–15.4)
WBC: 4.4 10*3/uL (ref 3.4–10.8)

## 2017-12-23 LAB — HEPATIC FUNCTION PANEL
ALT: 17 IU/L (ref 0–44)
AST: 15 IU/L (ref 0–40)
Albumin: 4.7 g/dL (ref 3.5–5.5)
Alkaline Phosphatase: 81 IU/L (ref 39–117)
Bilirubin Total: 1 mg/dL (ref 0.0–1.2)
Bilirubin, Direct: 0.24 mg/dL (ref 0.00–0.40)
Total Protein: 7 g/dL (ref 6.0–8.5)

## 2017-12-23 LAB — HIV ANTIBODY (ROUTINE TESTING W REFLEX): HIV SCREEN 4TH GENERATION: NONREACTIVE

## 2017-12-23 NOTE — Telephone Encounter (Signed)
LMOM with below lab results.  He does not need to return this call unless he has questions/fim 

## 2017-12-23 NOTE — Telephone Encounter (Signed)
-----   Message from Asa Lente, MD sent at 12/23/2017  2:33 PM EDT ----- Please let the patient know that the lab work is fine.

## 2018-01-01 ENCOUNTER — Ambulatory Visit
Admission: RE | Admit: 2018-01-01 | Discharge: 2018-01-01 | Disposition: A | Discharge status: Home / Self Care | Payer: Medicare Other | Source: Ambulatory Visit | Attending: Neurology | Admitting: Neurology

## 2018-01-01 DIAGNOSIS — G35 Multiple sclerosis: Secondary | ICD-10-CM

## 2018-01-01 MED ORDER — GADOBENATE DIMEGLUMINE 529 MG/ML IV SOLN
14.0000 mL | Freq: Once | INTRAVENOUS | Status: AC | PRN
  Administered 2018-01-01: 14 mL via INTRAVENOUS

## 2018-01-03 ENCOUNTER — Telehealth: Payer: Self-pay | Admitting: *Deleted

## 2018-01-03 NOTE — Telephone Encounter (Signed)
LMOM with below MRI results.  He does not need to return this call unless he has questions/fim 

## 2018-01-03 NOTE — Telephone Encounter (Signed)
-----   Message from Asa Lente, MD sent at 01/02/2018  6:10 PM EDT ----- Please let the patient know that the MRIs of the brain and cervical spine look good... No new MS lesions

## 2018-01-20 ENCOUNTER — Other Ambulatory Visit: Payer: Self-pay | Admitting: Neurology

## 2018-01-20 MED ORDER — AMPHETAMINE-DEXTROAMPHET ER 20 MG PO CP24: 20.0000 mg | ORAL_CAPSULE | Freq: Every day | ORAL | 0 refills | Status: DC

## 2018-01-20 NOTE — Telephone Encounter (Signed)
Pt has called for a refill prescription  on his amphetamine-dextroamphetamine (ADDERALL XR) 20 MG 24 hr capsule

## 2018-01-20 NOTE — Addendum Note (Signed)
Addended by: Candis Schatz I on: 01/20/2018 10:44 AM   Modules accepted: Orders

## 2018-01-24 ENCOUNTER — Telehealth: Payer: Self-pay

## 2018-01-24 NOTE — Telephone Encounter (Signed)
Received a fax from [facility] approving the Adderall XR 20 mg capsule. Effective date 05/16/2017. Expiration date 05/17/2018.

## 2018-01-24 NOTE — Telephone Encounter (Signed)
Initiated PA for Adderall XR 20 mg capsule thru cover my meds.  Key: [id number]  ICD Codes used ADD( F98.8) and Fatigue( R.35.83) Waiting for approval from [employer].  Pt is taking Adderall for MS related fatigue.

## 2018-02-17 ENCOUNTER — Telehealth: Payer: Self-pay | Admitting: Neurology

## 2018-02-17 MED ORDER — AMPHETAMINE-DEXTROAMPHET ER 20 MG PO CP24: 20.0000 mg | ORAL_CAPSULE | Freq: Every day | ORAL | 0 refills | Status: DC

## 2018-02-17 NOTE — Telephone Encounter (Signed)
Pt requesting refills for amphetamine-dextroamphetamine (ADDERALL XR) 20 MG 24 hr capsule sent to [facility]  °

## 2018-02-21 NOTE — Telephone Encounter (Signed)
Adderall rx. was escribed to [facility] on 02/17/18, with receipt confirmed by pharmacy.  I spoke with pharmacy tech who confirmed they did receive that rx.  They will call pt. to let him know he can pick it up/fim

## 2018-02-21 NOTE — Telephone Encounter (Signed)
Pt has contacted [facility], they do not have the script. Please resend

## 2018-03-01 IMAGING — MR MR CERVICAL SPINE WO/W CM
14 of 23 series · 22 of 48 positions shown · IV contrast (multihance)
Comparison: MRI of the brain and cervical spine dated 02/21/2015.

CLINICAL DATA: 30 y/o M; bilateral foot pain, bilateral foot and
hand numbness, and weakness for 1 week. History of multiple
sclerosis.

EXAM:
MRI HEAD WITHOUT AND WITH CONTRAST
MRI CERVICAL SPINE WITHOUT AND WITH CONTRAST
TECHNIQUE: Multiplanar, multiecho pulse sequences of the brain and surrounding
structures, and cervical spine, to include the craniocervical
junction and cervicothoracic junction, were obtained without and
with intravenous contrast.
CONTRAST:  14mL MULTIHANCE GADOBENATE DIMEGLUMINE 529 MG/ML IV SOLN

[Series 3: T1 · sagittal · 5.0mm · 0.47mm/px · 1 of 24 slices shown (1 of 3)]
[im 1/24]
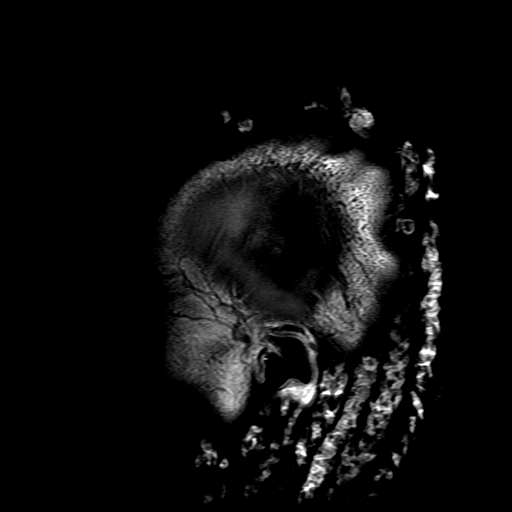

[Series 4: DWI · axial · 3.0mm · 1.09mm/px · z∈[-59,+85]mm · 4 of 100 slices shown (1 of 2)]
[im 1/100]
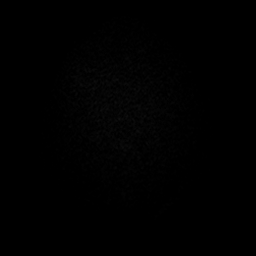
[im 34/100]
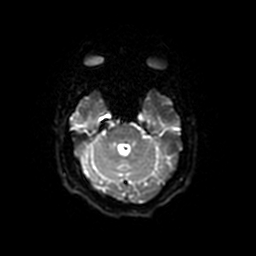
[im 67/100]
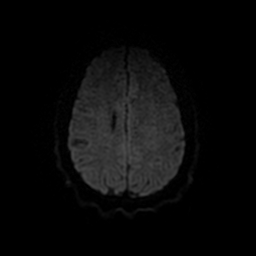
[im 100/100]
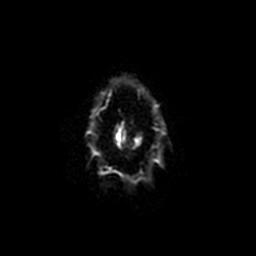

[Series 5: DWI · coronal · 5.0mm · 1.09mm/px · 2 of 72 slices shown (2 of 2)]
[im 1/72]
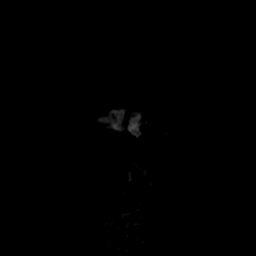
[im 72/72]
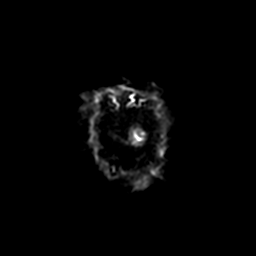

[Series 6: FLAIR · sagittal · 1.6mm · 0.47mm/px · 2 of 192 slices shown]
[im 1/192]
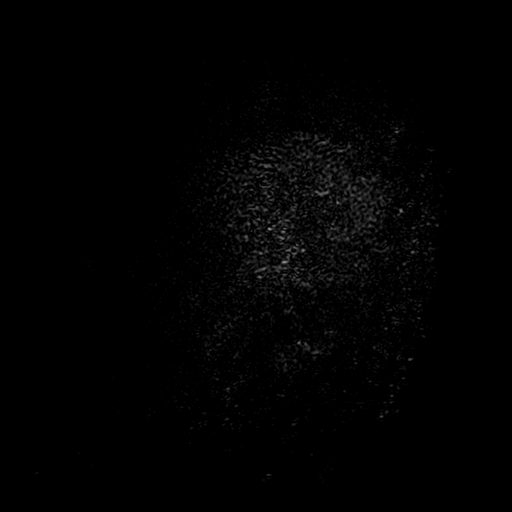
[im 24/192]
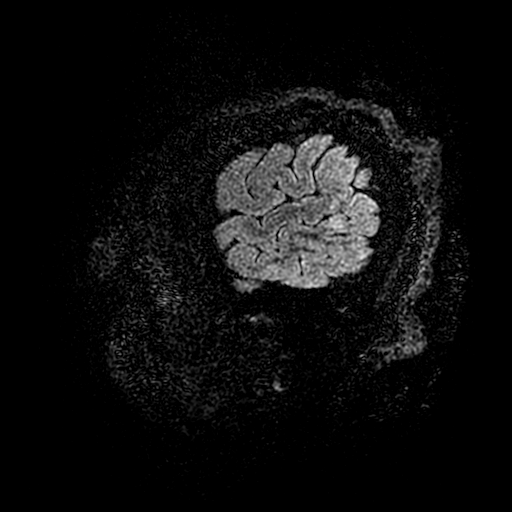

[Series 7: T2 · axial · 5.0mm · 0.43mm/px · 1 of 28 slices shown (1 of 3)]
[im 1/28]
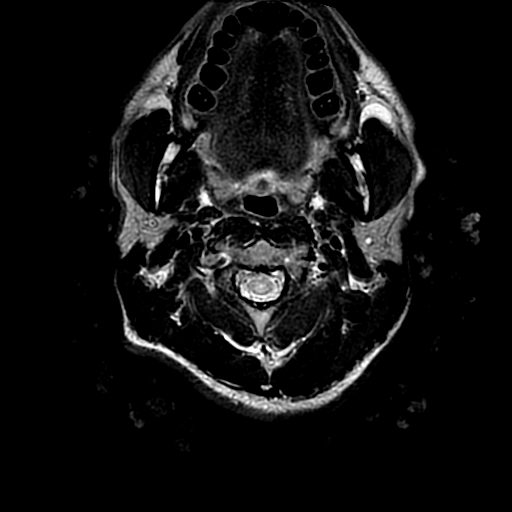

[Series 11: T2 · coronal · 5.0mm · 0.47mm/px · 1 of 24 slices shown (2 of 3)]
[im 1/24]
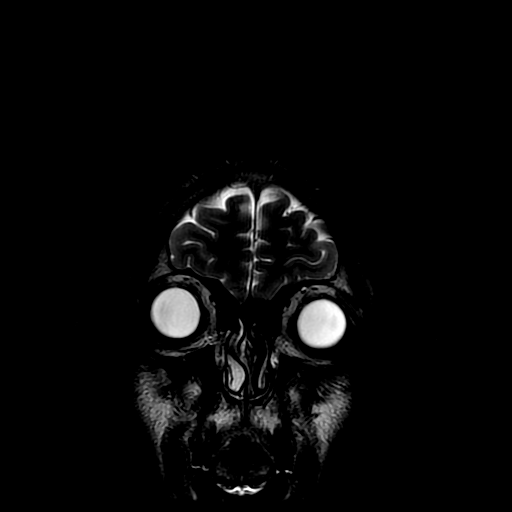

[Series 13: T1 · sagittal · 3.0mm · 0.41mm/px · 1 of 14 slices shown (2 of 3)]
[im 1/14]
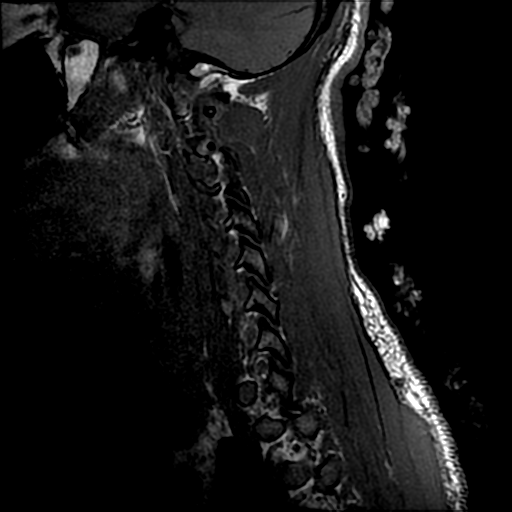

[Series 16: T2 · axial · 3.1mm · 0.35mm/px · z∈[-191,-80]mm · 2 of 33 slices shown (3 of 3)]
[im 1/33]
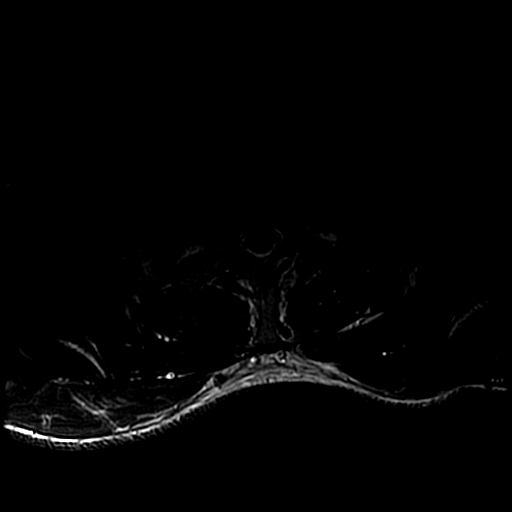
[im 33/33]
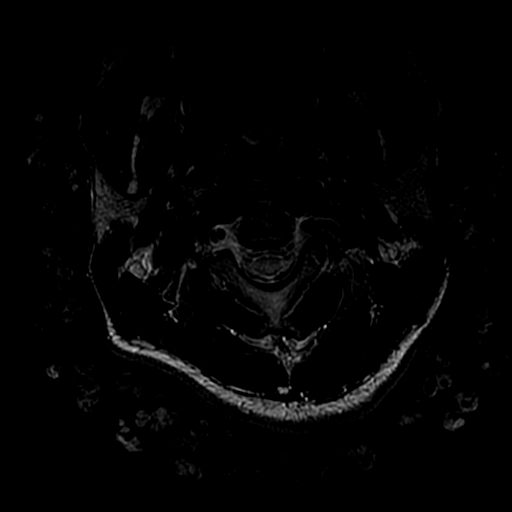

[Series 17: T1 · axial · non-contrast · 3.1mm · 0.35mm/px · z∈[-191,-80]mm · 2 of 33 slices shown (3 of 3)]
[im 1/33]
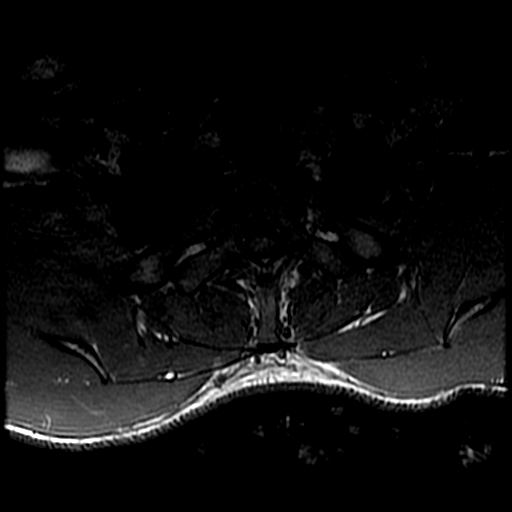
[im 33/33]
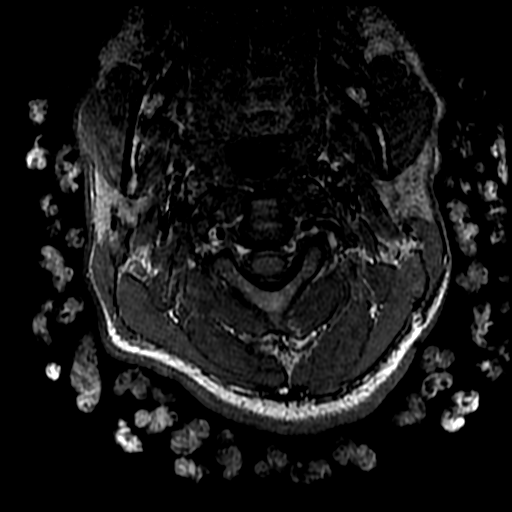

[Series 18: T2 post-contrast · sagittal · 3.0mm · 0.41mm/px · 1 of 14 slices shown]
[im 1/14]
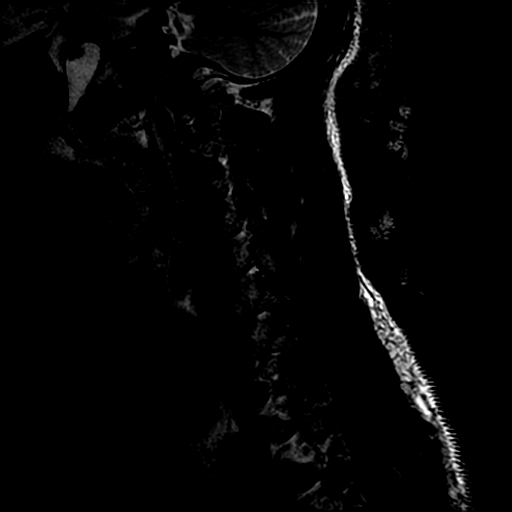

[Series 19: T1 fat-sat post-contrast · sagittal · 3.0mm · 0.82mm/px · 1 of 14 slices shown]
[im 1/14]
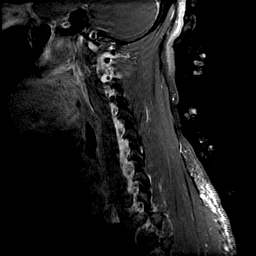

[Series 20: T1 post-contrast · axial · 3.1mm · 0.35mm/px · z∈[-191,-80]mm · 2 of 33 slices shown (1 of 3)]
[im 1/33]
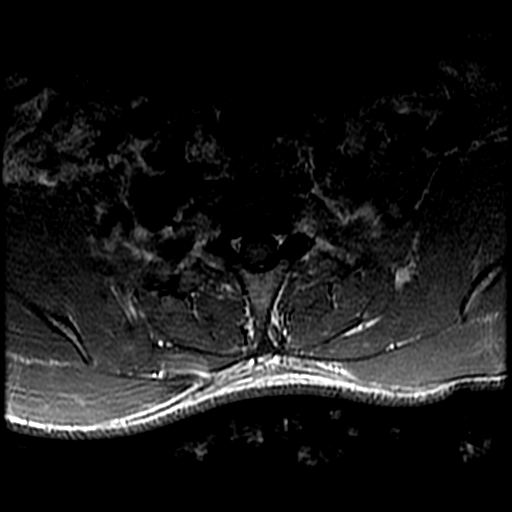
[im 33/33]
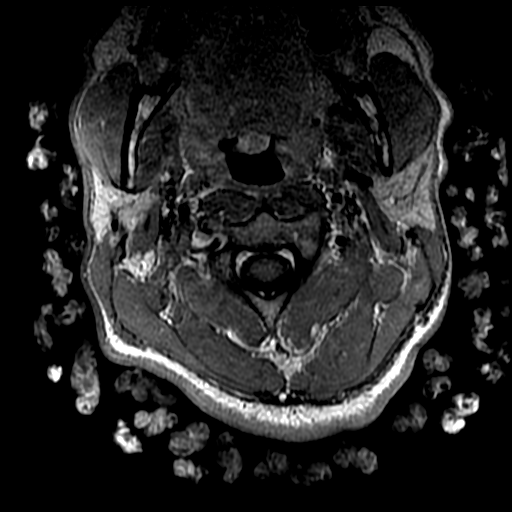

[Series 22: T1 post-contrast · coronal · 5.0mm · 0.47mm/px · 1 of 24 slices shown (2 of 3)]
[im 1/24]
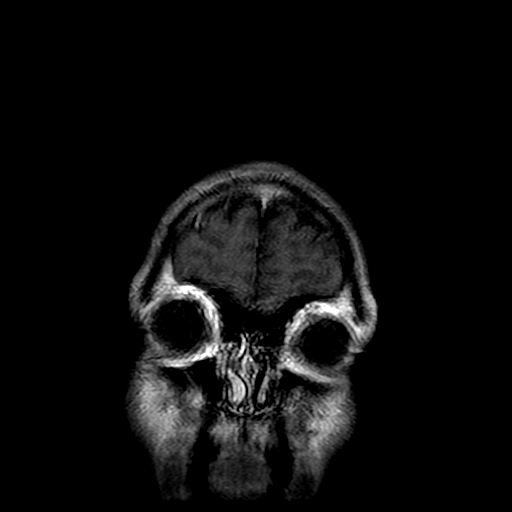

[Series 23: T1 post-contrast · sagittal · 5.0mm · 0.47mm/px · 1 of 24 slices shown (3 of 3)]
[im 1/24]
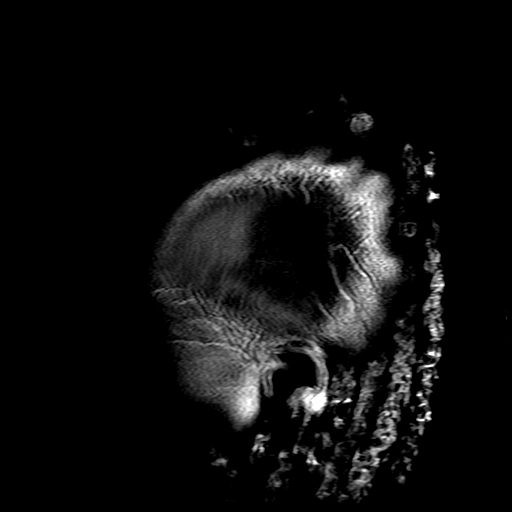

[22 of 48 positions shown; findings below may reference images not displayed]

FINDINGS: MRI HEAD FINDINGS

Brain: There are numerous T2 FLAIR hyperintense lesions in
periventricular white matter with a radial configuration, throughout
the corpus callosum in greatest concentration in the anterior body
and genu, multiple foci in subcortical and juxta cortical white
matter, foci within bilateral cerebellar hemispheres, and a focus
within the left posterior limb of internal capsule. Additionally on
axial T2 weighted sequences lesions can be seen within the right
posterior medulla, right anterior medulla, cervicomedullary
junction, and left superior cerebellar peduncle.

Between 10-15 lesions are new in comparison with the prior MRI of
the brain for example a large left parietal periventricular lesion
and the lesion within the left posterior limb of internal capsule.
The lesion within left posterior limb of internal capsule and a
subcortical lesion within the left parietal lobe demonstrate
diffusion restriction (series 4, image 29 and series 5, image 16).
After administration of intravenous contrast there is ring
enhancement of the new large left parietal periventricular lesion
(series 21, image 34) and there is enhancement of a new small lesion
within the left genu of corpus callosum (series 21, image 31).

There is no evidence for acute infarction, acute intracranial
hemorrhage, or significant mass effect.

Vascular: Normal flow voids.

Skull and upper cervical spine: Normal marrow signal.

Sinuses/Orbits: Left maxillary sinus mucous retention cyst.
Otherwise orbits and paranasal sinuses are normal.

Other: None.

MRI CERVICAL SPINE FINDINGS

Alignment: Straightening of cervical lordosis.

Vertebrae: No fracture, evidence of discitis, or bone lesion.

Cord: There are numerous T2 hyperintense cord lesions within the
central and left cord extending from C2 to C3-4. Within central,
right common posterior cord from C3-4 to C5. An additional lesion
within the left cord from C6 -T1. In comparison with the prior MRI
cord signal within the central and posterior cord at the C3-4 level
is new. No abnormal cord enhancement.

Posterior Fossa, vertebral arteries, paraspinal tissues: As above.

Disc levels: No significant disc displacement, foraminal narrowing,
or canal stenosis.
IMPRESSION: 1. Numerous white matter lesions in the supra and infratentorial
brain and cervical spinal cord are again seen compatible with
demyelination and history of multiple sclerosis. No brain
parenchymal volume loss.
2. There are 10-15 new white matter lesions from 02/21/2015 MRI of
the head, more prevalent in the left hemisphere.
3. Enhanced of lesions within the left genu of corpus callosum and
the left parietal periventricular white matter as well as diffusion
restriction in the left parietal subcortical white matter and the
left posterior limb of internal capsule indicate active
inflammation.
4. New cervical cord lesion at the C3-4 level. No abnormal
enhancement to suggest active inflammation.

By: Missae Handal M.D.

## 2018-03-16 DIAGNOSIS — Z114 Encounter for screening for human immunodeficiency virus [HIV]: Secondary | ICD-10-CM | POA: Diagnosis not present

## 2018-03-16 DIAGNOSIS — Z113 Encounter for screening for infections with a predominantly sexual mode of transmission: Secondary | ICD-10-CM | POA: Diagnosis not present

## 2018-03-16 DIAGNOSIS — N341 Nonspecific urethritis: Secondary | ICD-10-CM | POA: Diagnosis not present

## 2018-03-23 ENCOUNTER — Other Ambulatory Visit: Payer: Self-pay | Admitting: Neurology

## 2018-03-23 MED ORDER — AMPHETAMINE-DEXTROAMPHET ER 20 MG PO CP24: 20.0000 mg | ORAL_CAPSULE | Freq: Every day | ORAL | 0 refills | Status: DC

## 2018-03-23 NOTE — Telephone Encounter (Signed)
Pt requesting refills for amphetamine-dextroamphetamine (ADDERALL XR) 20 MG 24 hr capsule sent to [facility]  °

## 2018-04-15 ENCOUNTER — Encounter (HOSPITAL_COMMUNITY): Payer: Medicaid Other

## 2018-04-18 ENCOUNTER — Other Ambulatory Visit: Payer: Self-pay | Admitting: Neurology

## 2018-04-21 ENCOUNTER — Other Ambulatory Visit (HOSPITAL_COMMUNITY): Payer: Self-pay | Admitting: General Practice

## 2018-04-25 ENCOUNTER — Encounter (HOSPITAL_COMMUNITY)
Admission: RE | Admit: 2018-04-25 | Discharge: 2018-04-25 | Disposition: A | Discharge status: Home / Self Care | Payer: Medicare Other | Source: Ambulatory Visit | Attending: Neurology | Admitting: Neurology

## 2018-04-25 ENCOUNTER — Encounter (HOSPITAL_COMMUNITY): Payer: Self-pay

## 2018-04-25 DIAGNOSIS — G35 Multiple sclerosis: Secondary | ICD-10-CM | POA: Diagnosis not present

## 2018-04-25 MED ORDER — DIPHENHYDRAMINE HCL 25 MG PO CAPS
50.0000 mg | ORAL_CAPSULE | Freq: Once | ORAL | Status: AC
  Administered 2018-04-25: 50 mg via ORAL
  Filled 2018-04-25: qty 2

## 2018-04-25 MED ORDER — SODIUM CHLORIDE 0.9 % IV SOLN
500.0000 mg | Freq: Once | INTRAVENOUS | Status: AC
  Administered 2018-04-25: 500 mg via INTRAVENOUS
  Filled 2018-04-25: qty 4

## 2018-04-25 MED ORDER — SODIUM CHLORIDE 0.9 % IV SOLN
600.0000 mg | Freq: Once | INTRAVENOUS | Status: AC
  Administered 2018-04-25: 600 mg via INTRAVENOUS
  Filled 2018-04-25: qty 20

## 2018-04-25 MED ORDER — SODIUM CHLORIDE 0.9 % IV SOLN
Freq: Once | INTRAVENOUS | Status: AC
  Administered 2018-04-25: 10:00:00 via INTRAVENOUS

## 2018-04-25 MED ORDER — DIPHENHYDRAMINE HCL 50 MG/ML IJ SOLN
50.0000 mg | Freq: Once | INTRAMUSCULAR | Status: DC
  Filled 2018-04-25: qty 1

## 2018-04-26 ENCOUNTER — Other Ambulatory Visit: Payer: Self-pay | Admitting: Neurology

## 2018-04-26 MED ORDER — AMPHETAMINE-DEXTROAMPHET ER 20 MG PO CP24: 20.0000 mg | ORAL_CAPSULE | Freq: Every day | ORAL | 0 refills | Status: DC

## 2018-04-26 NOTE — Telephone Encounter (Signed)
Pt is asking for a refill on his amphetamine-dextroamphetamine (ADDERALL XR) 20 MG 24 hr capsule Please send to [facility] on [address] in [place]

## 2018-04-26 NOTE — Addendum Note (Signed)
Addended by: Candis Schatz I on: 04/26/2018 01:42 PM   Modules accepted: Orders

## 2018-05-25 ENCOUNTER — Other Ambulatory Visit: Payer: Self-pay | Admitting: Neurology

## 2018-05-25 MED ORDER — AMPHETAMINE-DEXTROAMPHET ER 20 MG PO CP24: 20.0000 mg | ORAL_CAPSULE | Freq: Every day | ORAL | 0 refills | Status: DC

## 2018-05-25 MED ORDER — HYDROCODONE-ACETAMINOPHEN 5-325 MG PO TABS: 1.0000 | ORAL_TABLET | Freq: Three times a day (TID) | ORAL | 0 refills | Status: DC | PRN

## 2018-05-25 NOTE — Telephone Encounter (Signed)
Pt called needing a refill on  amphetamine-dextroamphetamine (ADDERALL XR) 20 MG 24 hr capsule HYDROcodone-acetaminophen (NORCO/VICODIN) 5-325 MG tablet Please call in to the [facility] on [address]

## 2018-05-25 NOTE — Addendum Note (Signed)
Addended by: Brooke Dare, Zoua Caporaso L on: 05/25/2018 03:00 PM   Modules accepted: Orders

## 2018-05-25 NOTE — Telephone Encounter (Signed)
Spoke with Dr. Epimenio Foot. Okay to refill hydrocodone. Per [place] law, have to now escribe controlled rx's. Sent request to Dr. Epimenio Foot.

## 2018-06-28 ENCOUNTER — Encounter: Payer: Self-pay | Admitting: Neurology

## 2018-06-28 ENCOUNTER — Ambulatory Visit (INDEPENDENT_AMBULATORY_CARE_PROVIDER_SITE_OTHER): Payer: Medicare Other | Admitting: Neurology

## 2018-06-28 VITALS — BP 110/70 | HR 77 | Ht 66.0 in | Wt 168.0 lb

## 2018-06-28 DIAGNOSIS — R252 Cramp and spasm: Secondary | ICD-10-CM | POA: Diagnosis not present

## 2018-06-28 DIAGNOSIS — R269 Unspecified abnormalities of gait and mobility: Secondary | ICD-10-CM | POA: Diagnosis not present

## 2018-06-28 DIAGNOSIS — R5383 Other fatigue: Secondary | ICD-10-CM

## 2018-06-28 DIAGNOSIS — G35 Multiple sclerosis: Secondary | ICD-10-CM

## 2018-06-28 DIAGNOSIS — Z79899 Other long term (current) drug therapy: Secondary | ICD-10-CM | POA: Diagnosis not present

## 2018-06-28 MED ORDER — AMPHETAMINE-DEXTROAMPHET ER 20 MG PO CP24: 20.0000 mg | ORAL_CAPSULE | Freq: Every day | ORAL | 0 refills | Status: DC

## 2018-06-28 MED ORDER — BACLOFEN 10 MG PO TABS: 10.0000 mg | ORAL_TABLET | Freq: Four times a day (QID) | ORAL | 11 refills | Status: DC | PRN

## 2018-06-28 NOTE — Progress Notes (Signed)
[facility]  PATIENT: Mike Lowery DOB: [birth year]  REFERRING DOCTOR OR PCP:  Dr. Hyman Hopes SOURCE: Patient, family, records in the EMR, MRI images on PACS  _________________________________   HISTORICAL  CHIEF COMPLAINT:  Chief Complaint  Patient presents with  . Follow-up    RM 12, alone. Last seen 12/22/17. No new sx.   . Multiple Sclerosis    On Ocrevus. Last infusion:04/25/18. Received at [facility] short stay.    HISTORY OF PRESENT ILLNESS:  Mike Lowery,is a 33 y.o. man with multiple sclerosis.    Update 06/28/2018: He is on Ocrevus and he tolerates it well.    His last infusion was in December 2019.    He has tolerated it well.   He denies any exacerbations.    He stumbles now and then but gait has done well fo the most part.    He needs to be careful going down stairs but is fine going up.     The legs are strong.   He denies numbness or tingling in his legs.   Sometimes he has hand tingling.   Vision is doing the same with color desaturation on the right.     Bladder is doing well.   His fatigue is mild.    He has gone back to exercising.    MRI's of the brain and cervical spine 01/01/2018 did not show any new lesions compared to the 07/16/2016 scans.    IgG/IgM 06/23/2017 and CBC/D were fine.   He feels cognition is stable.     Update 12/22/2017: He is on ocrelizumab and is tolerating it well.    His last infusion was in March 2019.      No exacerbatins or new symptoms.   His last brain MRI was March 2018 and it had shown progression compared to the 2016 MRI.  Some of the lesions were enhancing.  He also had a new cervical spinal cord lesion C3-C4 without enhancement.   Blood work at the last visit showed normal IgG/IgM.  He feels his balance is off.   Gait is spastic but still much better than 2 years ago.    He has some spasticity but no longer gets the phasic spasms.    He has started drinking again and continues to use marijuana.    We discussed that those may  worsen his balance and increase risk of falls.   Bladder function is doing well.    Ed is less of a problem.    He has had unprotected sex with multiple partners.  He notes fatigue but less than last year.  MJ worsens his fatigue.   Adderall has helped.        Update 06/23/2017: He feels gait is much better.   His balance is still poor but he does not need a cane.    He denies numbness or weakness now and no longer gets spasms   He denies any problems with bladder.    Vision is worse out of the right eye than the left.      He feels fatigue is helped a lot by Adderall.    He feels focus and attention are also better on Adderall.   Mood is doing better this year than last year. He is sleeping well at night.   From 12/21/2016: MS:   He had his 2 split doses of ocrelizumab on 08/05/2016 and 08/19/2016 and his second dose  03/17/2017.   He is doing much better.  He tolerates the infusions well.    He switched to the ocrelizumab due to large exacerbation in early March 2018. MRI showed multiple enhancing lesions of the brain and 1 new lesion at C4.   He tolerated the ocrelizumab well. He feels much better since the infusion has not had any new symptoms and notes that some of the old symptoms improved   Spasms/spells:    He has tonic spasticity in his legs but no longer has the phasic spasticity. He has no more spells of drawing up in the arm or leg.    Gait/strength/sensation: He feels he is walking better than he did earlier this year when he had an exacerbation. Gait was even worse last year and a 2016 due to his initial exacerbation.    He also notes painful dysesthesias involving the legs and left > right arms..   This has also improved over the past few months.   Gabapentin 3 x 300 mg daily helps  Baclofen is helping the tonic spasticity.    .    Bladder:    He has mild urinary frequency and urgency. There is no incontinence. His ED is better  Fatigue/sleep:    He notes fatigue that is cognitive more  than physical. Adderall has helped. He also feels it has helped his focus. He does not take every day.   Mood:  Mood is much better this year than last year and he no longer takes Cymbalta.  Cognition:   He is doing better with improved attention and focus. He still has some word finding difficulties but these are not as bad as they were last year.  Adderall helps his cognitive skills and attention and helps his fatigue and sleepiness a lot.  Tolerates the current dose well.  MS HISTORY:  In mid 2015, he had the onset of weakness and numbness in the arms and legs, a little worse on the left.   When symptoms persisted, he presented to [facility] emergency room. MRI was consistent with multiple sclerosis and he was admitted. The MRI of the brain showed several plaques, mostly in the periventricular white matter. The MRI of the cervical spine showed several large T2 hyperintense foci, with some enhancement. He was given IV Solu-Medrol and his symptoms improved quite a bit. He followed up with Dr. Everlena Cooper and was placed on Plegridy about 8 or 9 months after starting Plegridy, he had another exacerbation and MRI of the brain and cervical spine were performed again 02/21/2015. The MRI of the cervical spine does not show any definite new lesions. However, the MRI of the brain does show several new lesions including for small enhancing foci. He was switched to [employer] November or December 2016.   I have personally reviewed the MRIs of the brain and cervical spine from October 2016. The MRI of the brain shows multiple T2/FLAIR hyperintense foci consistent with MS in the hemispheres and brainstem.   4 of the hemispheric small foci enhanced after gadolinium administration. The MRI of the cervical spine shows multiple hyperintense foci. There was a normal enhancement pattern.   He had a large exacerbation while on Tecfidera with new gait difficulties March 2018.   MRI's show multiple new lesions in the brain with several  lesions enhancing including a larger one in the left periventrivcular white matter.    Also has a new focus adjacent to C4 in the spinal cord.  He was switched to ocrelizumab and had his first infusion at the end  of March 2018   REVIEW OF SYSTEMS: Constitutional: No fevers, chills, sweats, or change in appetite.   He has fatigue and sleepiness. Eyes: No visual changes, double vision, eye pain Ear, nose and throat: No hearing loss, ear pain, nasal congestion, sore throat Cardiovascular: No chest pain, palpitations Respiratory: No shortness of breath at rest or with exertion.   No wheezes GastrointestinaI: No nausea, vomiting, diarrhea, abdominal pain, fecal incontinence Genitourinary: He notes urinary frequency and ED.    Musculoskeletal: No neck pain, back pain Integumentary: No rash, pruritus, skin lesions Neurological: as above Psychiatric: See above Endocrine: No palpitations, diaphoresis, change in appetite, change in weigh or increased thirst Hematologic/Lymphatic: No anemia, purpura, petechiae. Allergic/Immunologic: No itchy/runny eyes, nasal congestion, recent allergic reactions, rashes  ALLERGIES: No Known Allergies  HOME MEDICATIONS:  Current Outpatient Medications:  .  amphetamine-dextroamphetamine (ADDERALL XR) 20 MG 24 hr capsule, Take 1 capsule (20 mg total) by mouth daily., Disp: 30 capsule, Rfl: 0 .  baclofen (LIORESAL) 10 MG tablet, Take 1 tablet (10 mg total) by mouth 4 (four) times daily as needed for muscle spasms., Disp: 120 tablet, Rfl: 11 .  HYDROcodone-acetaminophen (NORCO/VICODIN) 5-325 MG tablet, Take 1 tablet by mouth every 8 (eight) hours as needed for moderate pain., Disp: 30 tablet, Rfl: 0 .  ibuprofen (ADVIL,MOTRIN) 200 MG tablet, Take 400 mg by mouth every 6 (six) hours as needed for moderate pain. , Disp: , Rfl:  .  ocrelizumab (OCREVUS) 300 MG/10ML injection, Inject 20 mLs (600 mg total) into the vein every 6 (six) months., Disp: 20 mL, Rfl: 0  PAST  MEDICAL HISTORY: Past Medical History:  Diagnosis Date  . Depression    situaltional  . Dyspnea   . Multiple sclerosis (HCC) 12/24/13  . Multiple sclerosis (HCC)   . Vision abnormalities     PAST SURGICAL HISTORY: Past Surgical History:  Procedure Laterality Date  . NO PAST SURGERIES      FAMILY HISTORY: Family History  Problem Relation Age of Onset  . Healthy Mother   . Healthy Father   . Cancer Maternal Grandmother        unknown   . Ataxia Sister   . Multiple sclerosis Neg Hx     SOCIAL HISTORY:  Social History   Socioeconomic History  . Marital status: Single    Spouse name: Not on file  . Number of children: Not on file  . Years of education: Not on file  . Highest education level: Not on file  Occupational History  . Not on file  Social Needs  . Financial resource strain: Not on file  . Food insecurity:    Worry: Not on file    Inability: Not on file  . Transportation needs:    Medical: Not on file    Non-medical: Not on file  Tobacco Use  . Smoking status: Former Smoker    Types: Cigarettes    Last attempt to quit: 04/18/2013    Years since quitting: 5.1  . Smokeless tobacco: Former Neurosurgeon    Quit date: 09/08/2014  Substance and Sexual Activity  . Alcohol use: Yes  . Drug use: No    Types: Marijuana    Comment: former  . Sexual activity: Yes    Partners: Female  Lifestyle  . Physical activity:    Days per week: Not on file    Minutes per session: Not on file  . Stress: Not on file  Relationships  . Social connections:    Talks  on phone: Not on file    Gets together: Not on file    Attends religious service: Not on file    Active member of club or organization: Not on file    Attends meetings of clubs or organizations: Not on file    Relationship status: Not on file  . Intimate partner violence:    Fear of current or ex partner: Not on file    Emotionally abused: Not on file    Physically abused: Not on file    Forced sexual activity: Not  on file  Other Topics Concern  . Not on file  Social History Narrative  . Not on file     PHYSICAL EXAM  Vitals:   06/28/18 1445  BP: 110/70  Pulse: 77  SpO2: 98%  Weight: 168 lb (76.2 kg)  Height: 5\' 6"  (1.676 m)    Body mass index is 27.12 kg/m.   General: The patient is well-developed and well-nourished and in no acute distress   Skin: Extremities are without significant rash or edema.   Neurologic Exam  Mental status: The patient is alert and oriented x 3 at the time of the examination. The patient has apparent normal recent and remote memory, with a reduced attention span and concentration ability.   Speech is normal.  Cranial nerves: Extraocular movements are full.  Facial strength and sensation is normal.  Trapezius strength is normal..  The tongue is midline, and the patient has symmetric elevation of the soft palate. No obvious hearing deficits are noted.  Motor:   Muscle bulk is normal.   Muscle tone is increased in the legs but strength is 5/5   Sensory:  He reports normal and symmetric sensation to touch and vibration in the arms or legs..  Coordination: On cerebellar testing finger-nose-finger is normal.  He has reduced heel-to-shin.  Gait and station: Station is normal.   His gait is slightly spastic.  The tandem gait is wide.  Romberg is now borderline..   Reflexes: Deep tendon reflexes are symmetric and 3 in arms.  He has increased reflexes in the legs but no ankle clonus .    DIAGNOSTIC DATA (LABS, IMAGING, TESTING) - I reviewed patient records, labs, notes, testing and imaging myself where available.  Lab Results  Component Value Date   WBC 4.4 12/22/2017   HGB 14.6 12/22/2017   HCT 43.3 12/22/2017   MCV 88 12/22/2017   PLT 292 12/22/2017      Component Value Date/Time   NA 139 07/16/2016 1520   K 4.1 07/16/2016 1520   CL 106 07/16/2016 1520   CO2 27 07/16/2016 1520   GLUCOSE 117 (H) 07/16/2016 1520   BUN 12 07/16/2016 1520    CREATININE 0.81 07/16/2016 1520   CREATININE 0.92 03/27/2015 1130   CALCIUM 9.3 07/16/2016 1520   PROT 7.0 12/22/2017 1559   ALBUMIN 4.7 12/22/2017 1559   AST 15 12/22/2017 1559   ALT 17 12/22/2017 1559   ALKPHOS 81 12/22/2017 1559   BILITOT 1.0 12/22/2017 1559   GFRNONAA >60 07/16/2016 1520   GFRNONAA >89 10/10/2014 1251   GFRAA >60 07/16/2016 1520   GFRAA >89 10/10/2014 1251       ASSESSMENT AND PLAN  Multiple sclerosis (HCC) - Plan: IgG, IgA, IgM, CBC with Differential/Platelet  Spasticity  High risk medication use - Plan: IgG, IgA, IgM, CBC with Differential/Platelet  Other fatigue  Gait disturbance    1.   Continue ocrelizumab for MS. His next infusion will  be around end of April.  Check CBC with differential, LFT and HIV  2.   Renew Adderall for attention deficit and fatigue 3.   Continue baclofen for spasticity 4.   Return to see me in 6 months or sooner if there are new or worsening symptoms.  Kaeson Kleinert A. Epimenio FootSater, MD, PhD 06/28/2018, 6:34 PM Certified in Neurology, Clinical Neurophysiology, Sleep Medicine, Pain Medicine and Neuroimaging  [facility] [address], [address] [place], [place] [address] [phone]

## 2018-06-29 ENCOUNTER — Telehealth: Payer: Self-pay | Admitting: *Deleted

## 2018-06-29 LAB — CBC WITH DIFFERENTIAL/PLATELET
Basophils Absolute: 0 10*3/uL (ref 0.0–0.2)
Basos: 1 %
EOS (ABSOLUTE): 0.2 10*3/uL (ref 0.0–0.4)
Eos: 4 %
Hematocrit: 40.5 % (ref 37.5–51.0)
Hemoglobin: 13.7 g/dL (ref 13.0–17.7)
Immature Grans (Abs): 0 10*3/uL (ref 0.0–0.1)
Immature Granulocytes: 0 %
Lymphocytes Absolute: 1.8 10*3/uL (ref 0.7–3.1)
Lymphs: 35 %
MCH: 29.7 pg (ref 26.6–33.0)
MCHC: 33.8 g/dL (ref 31.5–35.7)
MCV: 88 fL (ref 79–97)
MONOS ABS: 0.6 10*3/uL (ref 0.1–0.9)
Monocytes: 12 %
Neutrophils Absolute: 2.4 10*3/uL (ref 1.4–7.0)
Neutrophils: 48 %
PLATELETS: 295 10*3/uL (ref 150–450)
RBC: 4.61 x10E6/uL (ref 4.14–5.80)
RDW: 12.4 % (ref 11.6–15.4)
WBC: 5 10*3/uL (ref 3.4–10.8)

## 2018-06-29 LAB — IGG, IGA, IGM
IGM (IMMUNOGLOBULIN M), SRM: 35 mg/dL (ref 20–172)
IgA/Immunoglobulin A, Serum: 79 mg/dL — ABNORMAL LOW (ref 90–386)
IgG (Immunoglobin G), Serum: 798 mg/dL (ref 700–1600)

## 2018-06-29 NOTE — Telephone Encounter (Signed)
-----   Message from Richard A Sater, MD sent at 06/29/2018 12:20 PM EST ----- Please let the patient know that the lab work is fine.  

## 2018-06-29 NOTE — Telephone Encounter (Signed)
Called, LVM for pt to call about results. Ok to inform pt labs were fine per Dr. Epimenio Foot

## 2018-07-01 NOTE — Telephone Encounter (Signed)
Tried calling pt again. LVM for pt to call.

## 2018-07-05 ENCOUNTER — Encounter: Payer: Self-pay | Admitting: *Deleted

## 2018-07-05 NOTE — Telephone Encounter (Signed)
Sent pt letter about results. 

## 2018-07-12 ENCOUNTER — Telehealth: Payer: Self-pay | Admitting: Neurology

## 2018-07-12 MED ORDER — HYDROCODONE-ACETAMINOPHEN 5-325 MG PO TABS: 1.0000 | ORAL_TABLET | Freq: Three times a day (TID) | ORAL | 0 refills | Status: DC | PRN

## 2018-07-12 NOTE — Telephone Encounter (Signed)
Called pt back. He started baclofen again on 06/29/18 but otherwise has not started on any other medication. Last Ocrevus infusion 04/2018. Denies changing laundry detergent or soap. Legs/arms are swelling from scratching the rash per pt. Advised I will discuss with Dr. Epimenio Foot and call back with recommendation. He verbalized understanding.  I checked drug registry. He last refilled rx hydrocodone 05/25/18 #30. Not receiving from other Md's. Last seen 06/28/18 and next f/u 12/27/18

## 2018-07-12 NOTE — Addendum Note (Signed)
Addended by: Hillis Range on: 07/12/2018 03:11 PM   Modules accepted: Orders

## 2018-07-12 NOTE — Telephone Encounter (Signed)
Spoke with Dr. Epimenio Foot- he can try stopping the baclofen to see if this will clear up rash. If it does not, he should f/u with PCP

## 2018-07-12 NOTE — Telephone Encounter (Signed)
Pt has called to request a refill on his HYDROcodone-acetaminophen (NORCO/VICODIN) 5-325 MG tablet  [facility] [phone]  Pt has also mentioned a breakout like hives on legs and arms for about 3-4 days.  Please call

## 2018-07-12 NOTE — Telephone Encounter (Signed)
I called and spoke with pt. Relayed recommendation below. He verbalized understanding.

## 2018-07-14 NOTE — Telephone Encounter (Signed)
I called [employer] and asked that they fax PA form for [place] to complete for hydrocodone. Waiting on that fax.

## 2018-07-14 NOTE — Telephone Encounter (Signed)
Submitted PA on [employer] website. Confirmation #:[id number] W. Waiting on determination.

## 2018-07-14 NOTE — Telephone Encounter (Signed)
Pt has called asking if RN Kara Mead has been contacted by his insurance company re: the cost of his HYDROcodone-acetaminophen (NORCO/VICODIN) 5-325 MG .  Pt states the cost was very high when he went to try and pick it up

## 2018-07-14 NOTE — Telephone Encounter (Addendum)
I called [employer] because PA was denied. They stated he has Medicare Part D plan and it should be covered under this plan not them.   I called [phone] that pharmacy sent [place] for Med D pharmacy help desk number. This number did not get me to correct department. They advised me to call [phone] instead to complete PA via [employer]. They stated they do not handle PA's for pt. I have to go through [facility]. I should call them at [phone].   Patient ID: [id number].

## 2018-07-14 NOTE — Telephone Encounter (Signed)
Called pt back and informed him we received PA request this am. We will work on this today for him and will update him once we hear from his insurance company. He verbalized understanding.

## 2018-07-15 NOTE — Telephone Encounter (Signed)
Received fax from [facility]. In process of completing PA.

## 2018-07-15 NOTE — Telephone Encounter (Signed)
PA submitted as urgent via fax. Fax number: [phone]. Received fax confirmation. Waiting on determination.

## 2018-07-24 ENCOUNTER — Encounter (HOSPITAL_COMMUNITY): Payer: Self-pay | Admitting: Emergency Medicine

## 2018-07-24 ENCOUNTER — Ambulatory Visit (HOSPITAL_COMMUNITY)
Admission: EM | Admit: 2018-07-24 | Discharge: 2018-07-24 | Disposition: A | Discharge status: Home / Self Care | Payer: Medicare Other | Attending: Internal Medicine | Admitting: Internal Medicine

## 2018-07-24 DIAGNOSIS — J00 Acute nasopharyngitis [common cold]: Secondary | ICD-10-CM | POA: Diagnosis not present

## 2018-07-24 DIAGNOSIS — H6123 Impacted cerumen, bilateral: Secondary | ICD-10-CM

## 2018-07-24 LAB — POCT RAPID STREP A: Streptococcus, Group A Screen (Direct): NEGATIVE

## 2018-07-24 MED ORDER — SALINE SPRAY 0.65 % NA SOLN: 1.0000 | NASAL | 0 refills | Status: DC | PRN

## 2018-07-24 MED ORDER — BENZONATATE 100 MG PO CAPS: 100.0000 mg | ORAL_CAPSULE | Freq: Three times a day (TID) | ORAL | 0 refills | Status: DC

## 2018-07-24 MED ORDER — TRIAMCINOLONE ACETONIDE 55 MCG/ACT NA AERO: 2.0000 | INHALATION_SPRAY | Freq: Every day | NASAL | 12 refills | Status: DC

## 2018-07-24 NOTE — Discharge Instructions (Signed)
Push fluids to ensure adequate hydration and keep secretions thin.  Tylenol and/or ibuprofen as needed for pain or fevers.  Use of over the counter treatments as needed for symptoms. Tessalon as needed for cough, use of saline nasal spray 3-4 times a day to keep nares moist. Nasacort once a day to help with congestion.  If symptoms worsen or do not improve in the next week to return to be seen or to follow up with your PCP.

## 2018-07-24 NOTE — ED Provider Notes (Signed)
[facility]    CSN: [id number] Arrival date & time: 07/24/18  1503     History   Chief Complaint Chief Complaint  Patient presents with  . Sore Throat    HPI Mike Lowery is a 33 y.o. male.   Mike Lowery presents with complaints of sore throat which has worsened over the past three days. Some right ear pressure. Productive cough. Congestion. No gi/gu complaints. Daughter and other family members also with URI symptoms. Has been taking halls, theraflu, tussin, dayquil, mucinex which haven't helped. Takes hydrocodone and this helps with his sore throat pain. No shortness of breath  Or chest pain . No difficulty with swallowing but pain with swallowing. Hx of ms.     ROS per HPI, negative if not otherwise mentioned.      Past Medical History:  Diagnosis Date  . Depression    situaltional  . Dyspnea   . Multiple sclerosis (HCC) 12/24/13  . Multiple sclerosis (HCC)   . Vision abnormalities     Patient Active Problem List   Diagnosis Date Noted  . High risk sexual behavior 12/22/2017  . Immunosuppression (HCC) 12/22/2017  . Screening for human immunodeficiency virus 12/22/2017  . High risk medication use 12/21/2016  . Hepatitis B antibody positive 07/19/2016  . Spasticity 06/17/2016  . Spells 07/22/2015  . Depression with anxiety 07/02/2015  . Other fatigue 05/01/2015  . Numbness 05/01/2015  . Gait disturbance 05/01/2015  . Disturbed cognition 05/01/2015  . Erectile dysfunction 05/01/2015  . Multiple sclerosis exacerbation (HCC) 03/11/2015  . Tobacco abuse 03/06/2015  . Nausea without vomiting 11/08/2014  . 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 Relapsing remitting multiple sclerosis (HCC) 10/01/2014  . Swelling of both hands 05/14/2014  . Multiple sclerosis (HCC) 12/24/2013    Past Surgical History:  Procedure Laterality Date  . NO PAST SURGERIES         Home Medications    Prior to Admission medications   Medication Sig  Start Date End Date Taking? Authorizing Provider  amphetamine-dextroamphetamine (ADDERALL XR) 20 MG 24 hr capsule Take 1 capsule (20 mg total) by mouth daily. 06/28/18   Sater, Pearletha Furl, MD  baclofen (LIORESAL) 10 MG tablet Take 1 tablet (10 mg total) by mouth 4 (four) times daily as needed for muscle spasms. 06/28/18   Sater, Pearletha Furl, MD  benzonatate (TESSALON) 100 MG capsule Take 1 capsule (100 mg total) by mouth every 8 (eight) hours. 07/24/18   Georgetta Haber, NP  HYDROcodone-acetaminophen (NORCO/VICODIN) 5-325 MG tablet Take 1 tablet by mouth every 8 (eight) hours as needed for moderate pain. 07/12/18   Sater, Pearletha Furl, MD  ibuprofen (ADVIL,MOTRIN) 200 MG tablet Take 400 mg by mouth every 6 (six) hours as needed for moderate pain.     [provider]  ocrelizumab (OCREVUS) 300 MG/10ML injection Inject 20 mLs (600 mg total) into the vein every 6 (six) months. 04/18/18   Sater, Pearletha Furl, MD  sodium chloride (OCEAN) 0.65 % SOLN nasal spray Place 1 spray into both nostrils as needed for congestion. 07/24/18   Georgetta Haber, NP  triamcinolone (NASACORT) 55 MCG/ACT AERO nasal inhaler Place 2 sprays into the nose daily. 07/24/18   Georgetta Haber, NP    Family History Family History  Problem Relation Age of Onset  . Healthy Mother   . Healthy Father   . Cancer Maternal Grandmother        unknown   . Ataxia Sister   .  Multiple sclerosis Neg Hx     Social History Social History   Tobacco Use  . Smoking status: Former Smoker    Types: Cigarettes    Last attempt to quit: 04/18/2013    Years since quitting: 5.2  . Smokeless tobacco: Former Neurosurgeon    Quit date: 09/08/2014  Substance Use Topics  . Alcohol use: Yes  . Drug use: No    Types: Marijuana    Comment: former     Allergies   Patient has no known allergies.   Review of Systems Review of Systems   Physical Exam Triage Vital Signs ED Triage Vitals  Enc Vitals Group     BP 07/24/18 1553 124/72     Pulse Rate  07/24/18 1553 86     Resp 07/24/18 1553 18     Temp 07/24/18 1553 98.8 F (37.1 C)     Temp Source 07/24/18 1553 Temporal     SpO2 07/24/18 1553 97 %     Weight --      Height --      Head Circumference --      Peak Flow --      Pain Score 07/24/18 1554 5     Pain Loc --      Pain Edu? --      Excl. in GC? --    No data found.  Updated Vital Signs BP 124/72 (BP Location: Right Arm)   Pulse 86   Temp 98.8 F (37.1 C) (Temporal)   Resp 18   SpO2 97%    Physical Exam Vitals signs reviewed.  Constitutional:      Appearance: He is well-developed.  HENT:     Head: Normocephalic and atraumatic.     Right Ear: Tympanic membrane, ear canal and external ear normal. There is impacted cerumen.     Left Ear: Tympanic membrane, ear canal and external ear normal. There is impacted cerumen.     Ears:     Comments: Ear lavage completed by nursing staff. Patient departed clinic prior to re-eval.     Nose: Nose normal.     Right Sinus: No maxillary sinus tenderness or frontal sinus tenderness.     Left Sinus: No maxillary sinus tenderness or frontal sinus tenderness.     Mouth/Throat:     Pharynx: Uvula midline.     Tonsils: No tonsillar exudate. Swelling: 1+ on the right. 1+ on the left.  Eyes:     Conjunctiva/sclera: Conjunctivae normal.     Pupils: Pupils are equal, round, and reactive to light.  Neck:     Musculoskeletal: Normal range of motion.  Cardiovascular:     Rate and Rhythm: Normal rate and regular rhythm.  Pulmonary:     Effort: Pulmonary effort is normal.     Breath sounds: Normal breath sounds.  Lymphadenopathy:     Cervical: No cervical adenopathy.  Skin:    General: Skin is warm and dry.  Neurological:     Mental Status: He is alert and oriented to person, place, and time.      UC Treatments / Results  Labs (all labs ordered are listed, but only abnormal results are displayed) Labs Reviewed  CULTURE, GROUP A STREP [facility])  POCT RAPID STREP A     EKG None  Radiology No results found.  Procedures Procedures (including critical care time)  Medications Ordered in UC Medications - No data to display  Initial Impression / Assessment and Plan / UC Course  I have  reviewed the triage vital signs and the nursing notes.  Pertinent labs & imaging results that were available during my care of the patient were reviewed by me and considered in my medical decision making (see chart for details).     Overall benign physical exam, negative strep. Afebrile. Ear lavage per patient request, completed by nursing. History and physical consistent with viral illness.  Supportive cares recommended. Return precautions provided. If symptoms worsen or do not improve in the next week to return to be seen or to follow up with PCP.  Patient verbalized understanding and agreeable to plan.   Final Clinical Impressions(s) / UC Diagnoses   Final diagnoses:  Acute nasopharyngitis     Discharge Instructions     Push fluids to ensure adequate hydration and keep secretions thin.  Tylenol and/or ibuprofen as needed for pain or fevers.  Use of over the counter treatments as needed for symptoms. Tessalon as needed for cough, use of saline nasal spray 3-4 times a day to keep nares moist. Nasacort once a day to help with congestion.  If symptoms worsen or do not improve in the next week to return to be seen or to follow up with your PCP.     ED Prescriptions    Medication Sig Dispense Auth. Provider   sodium chloride (OCEAN) 0.65 % SOLN nasal spray Place 1 spray into both nostrils as needed for congestion. 60 mL Linus Mako B, NP   triamcinolone (NASACORT) 55 MCG/ACT AERO nasal inhaler Place 2 sprays into the nose daily. 1 Inhaler Burky, Natalie B, NP   benzonatate (TESSALON) 100 MG capsule Take 1 capsule (100 mg total) by mouth every 8 (eight) hours. 21 capsule Georgetta Haber, NP     Controlled Substance Prescriptions [place] Controlled Substance  Registry consulted? Not Applicable   Georgetta Haber, NP 07/24/18 2240

## 2018-07-24 NOTE — ED Triage Notes (Signed)
Pt here for sore throat and URI sx x 3 days

## 2018-07-26 NOTE — Telephone Encounter (Signed)
Called to check on status of PA. Spoke w/ Myles with [employer]. PA approved effective 07/12/18-05/18/19. Rx case#: [id number]  Case number for call: [id number]

## 2018-07-27 LAB — CULTURE, GROUP A STREP (THRC)

## 2018-08-02 ENCOUNTER — Other Ambulatory Visit: Payer: Self-pay | Admitting: Neurology

## 2018-08-02 MED ORDER — AMPHETAMINE-DEXTROAMPHET ER 20 MG PO CP24: 20.0000 mg | ORAL_CAPSULE | Freq: Every day | ORAL | 0 refills | Status: DC

## 2018-08-02 NOTE — Telephone Encounter (Signed)
Pt is needing a refill on his amphetamine-dextroamphetamine (ADDERALL XR) 20 MG 24 hr capsule sent to [facility] off [address]

## 2018-08-30 ENCOUNTER — Telehealth: Payer: Self-pay | Admitting: *Deleted

## 2018-08-30 NOTE — Telephone Encounter (Signed)
Previous PA on file effective 09/08/17-09/03/18. It is about to expire.  I faxed completed/signed PA form to [employer] with last OV notes at [phone]. Received fax confirmation. Waiting on determination.   Pt receives infusions at [facility]. Next infusion scheduled for: 11/11/18 at 8:00am.  Last infusion 04/25/18.

## 2018-08-31 ENCOUNTER — Other Ambulatory Visit: Payer: Self-pay | Admitting: Neurology

## 2018-08-31 MED ORDER — AMPHETAMINE-DEXTROAMPHET ER 20 MG PO CP24: 20.0000 mg | ORAL_CAPSULE | Freq: Every day | ORAL | 0 refills | Status: DC

## 2018-08-31 NOTE — Telephone Encounter (Signed)
I called [employer] to check on status of PA. PA was denied because patient is enrolled in Medicare part D plan. PA needs to be re-submitted under this.  Interaction ID# for call: [id number].  PA should be done via [facility]. Phone: [phone]. Patient ID: [id number].  I called and spoke with Pearl. She will fax PA to me at [phone] to be completed.  Ticket number for call: [id number]

## 2018-08-31 NOTE — Telephone Encounter (Signed)
Pt has called for a refill on his amphetamine-dextroamphetamine (ADDERALL XR) 20 MG 24 hr capsule °[id number]  °

## 2018-08-31 NOTE — Telephone Encounter (Signed)
Faxed completed/signed PA form back to [facility] at [phone]. Received fax confirmation. Waiting on determination.

## 2018-08-31 NOTE — Addendum Note (Signed)
Addended by: Hillis Range on: 08/31/2018 10:28 AM   Modules accepted: Orders

## 2018-09-05 NOTE — Telephone Encounter (Signed)
Faxed new orders to [facility] short stay with PA approval info. Fax: [phone]. Received fax confirmation.

## 2018-09-05 NOTE — Telephone Encounter (Signed)
Received fax notification from [facility] that PA approved effective 08/31/18 until further notice (no specific end date given). Ticket #[id number].  Patient RS#[id number].

## 2018-09-28 ENCOUNTER — Other Ambulatory Visit: Payer: Self-pay | Admitting: Neurology

## 2018-09-28 MED ORDER — AMPHETAMINE-DEXTROAMPHET ER 20 MG PO CP24: 20.0000 mg | ORAL_CAPSULE | Freq: Every day | ORAL | 0 refills | Status: DC

## 2018-09-28 NOTE — Telephone Encounter (Signed)
Pt called in for a refill of amphetamine-dextroamphetamine (ADDERALL XR) 20 MG 24 hr capsule to be sent to [id number] - [place], [place] - [address]

## 2018-11-11 ENCOUNTER — Encounter (HOSPITAL_COMMUNITY): Payer: Medicare Other

## 2018-11-29 ENCOUNTER — Other Ambulatory Visit: Payer: Self-pay | Admitting: Neurology

## 2018-11-29 MED ORDER — AMPHETAMINE-DEXTROAMPHET ER 20 MG PO CP24: 20.0000 mg | ORAL_CAPSULE | Freq: Every day | ORAL | 0 refills | Status: DC

## 2018-11-29 NOTE — Telephone Encounter (Signed)
Pt is needing a refill on his amphetamine-dextroamphetamine (ADDERALL XR) 20 MG 24 hr capsule sent to the [facility] on [address].

## 2018-12-06 ENCOUNTER — Other Ambulatory Visit: Payer: Self-pay

## 2018-12-06 ENCOUNTER — Ambulatory Visit (HOSPITAL_COMMUNITY)
Admission: RE | Admit: 2018-12-06 | Discharge: 2018-12-06 | Disposition: A | Discharge status: Home / Self Care | Payer: Medicare Other | Source: Ambulatory Visit | Attending: Neurology | Admitting: Neurology

## 2018-12-06 DIAGNOSIS — G35 Multiple sclerosis: Secondary | ICD-10-CM | POA: Diagnosis not present

## 2018-12-06 MED ORDER — DIPHENHYDRAMINE HCL 25 MG PO CAPS
50.0000 mg | ORAL_CAPSULE | ORAL | Status: DC
  Administered 2018-12-06: 50 mg via ORAL
  Filled 2018-12-06: qty 2

## 2018-12-06 MED ORDER — SODIUM CHLORIDE 0.9 % IV SOLN
500.0000 mg | INTRAVENOUS | Status: DC
  Administered 2018-12-06: 500 mg via INTRAVENOUS
  Filled 2018-12-06: qty 4

## 2018-12-06 MED ORDER — SODIUM CHLORIDE 0.9 % IV SOLN
600.0000 mg | INTRAVENOUS | Status: DC
  Administered 2018-12-06: 600 mg via INTRAVENOUS
  Filled 2018-12-06: qty 20

## 2018-12-06 MED ORDER — SODIUM CHLORIDE 0.9 % IV SOLN
INTRAVENOUS | Status: DC | PRN
  Administered 2018-12-06: 250 mL via INTRAVENOUS

## 2018-12-06 MED ORDER — SODIUM CHLORIDE 0.9 % IV SOLN
INTRAVENOUS | Status: DC
  Administered 2018-12-06: 09:00:00 via INTRAVENOUS

## 2018-12-06 NOTE — Discharge Instructions (Signed)
Ocrelizumab injection °What is this medicine? °OCRELIZUMAB (ok re LIZ ue mab) treats multiple sclerosis. It helps to decrease the number of multiple sclerosis relapses. It is not a cure. °This medicine may be used for other purposes; ask your health care provider or pharmacist if you have questions. °COMMON BRAND NAME(S): OCREVUS °What should I tell my health care provider before I take this medicine? °They need to know if you have any of these conditions: °· cancer °· hepatitis B infection °· other infection (especially a virus infection such as chickenpox, cold sores, or herpes) °· an unusual or allergic reaction to ocrelizumab, other medicines, foods, dyes or preservatives °· pregnant or trying to get pregnant °· breast-feeding °How should I use this medicine? °This medicine is for infusion into a vein. It is given by a health care professional in a hospital or clinic setting. °Talk to your pediatrician regarding the use of this medicine in children. Special care may be needed. °Overdosage: If you think you have taken too much of this medicine contact a poison control center or emergency room at once. °NOTE: This medicine is only for you. Do not share this medicine with others. °What if I miss a dose? °Keep appointments for follow-up doses as directed. It is important not to miss your dose. Call your doctor or health care professional if you are unable to keep an appointment. °What may interact with this medicine? °· alemtuzumab °· daclizumab °· dimethyl fumarate °· fingolimod °· glatiramer °· interferon beta °· live virus vaccines °· mitoxantrone °· natalizumab °· peginterferon beta °· rituximab °· steroid medicines like prednisone or cortisone °· teriflunomide °This list may not describe all possible interactions. Give your health care provider a list of all the medicines, herbs, non-prescription drugs, or dietary supplements you use. Also tell them if you smoke, drink alcohol, or use illegal drugs. Some items  may interact with your medicine. °What should I watch for while using this medicine? °Tell your doctor or healthcare professional if your symptoms do not start to get better or if they get worse. °This medicine can cause serious allergic reactions. To reduce your risk you may need to take medicine before treatment with this medicine. Take your medicine as directed. °Women should inform their doctor if they wish to become pregnant or think they might be pregnant. There is a potential for serious side effects to an unborn child. Talk to your health care professional or pharmacist for more information. Male patients should use effective birth control methods while receiving this medicine and for 6 months after the last dose. °Call your doctor or health care professional for advice if you get a fever, chills or sore throat, or other symptoms of a cold or flu. Do not treat yourself. This drug decreases your body's ability to fight infections. Try to avoid being around people who are sick. °If you have a hepatitis B infection or a history of a hepatitis B infection, talk to your doctor. The symptoms of hepatitis B may get worse if you take this medicine. °In some patients, this medicine may cause a serious brain infection that may cause death. If you have any problems seeing, thinking, speaking, walking, or standing, tell your doctor right away. If you cannot reach your doctor, urgently seek other source of medical care. °This medicine can decrease the response to a vaccine. If you need to get vaccinated, tell your healthcare professional if you have received this medicine. Extra booster doses may be needed. Talk to your   doctor to see if a different vaccination schedule is needed. °Talk to your doctor about your risk of cancer. You may be more at risk for certain types of cancers if you take this medicine. °What side effects may I notice from receiving this medicine? °Side effects that you should report to your doctor  or health care professional as soon as possible: °· allergic reactions like skin rash, itching or hives, swelling of the face, lips, or tongue °· breathing problems °· facial flushing °· fast, irregular heartbeat °· lump or soreness in the breast °· signs and symptoms of herpes such as cold sore, shingles, or genital sores °· signs and symptoms of infection like fever or chills, cough, sore throat, pain or trouble passing urine °· signs and symptoms of low blood pressure like dizziness; feeling faint or lightheaded, falls; unusually weak or tired °· signs and symptoms of progressive multifocal leukoencephalopathy (PML) like changes in vision; clumsiness; confusion; personality changes; weakness on one side of the body °· swelling of the ankles, feet, hands °Side effects that usually do not require medical attention (report these to your doctor or health care professional if they continue or are bothersome): °· back pain °· depressed mood °· diarrhea °· pain, redness, or irritation at site where injected °This list may not describe all possible side effects. Call your doctor for medical advice about side effects. You may report side effects to FDA at 1-800-FDA-1088. °Where should I keep my medicine? °This drug is given in a hospital or clinic and will not be stored at home. °NOTE: This sheet is a summary. It may not cover all possible information. If you have questions about this medicine, talk to your doctor, pharmacist, or health care provider. °© 2020 [employer] Standard (2015-08-20 09:40:25) ° °

## 2018-12-06 NOTE — Progress Notes (Signed)
PATIENT CARE CENTER NOTE  Diagnosis: Multiple Sclerosis   Provider: Arlice Colt, MD   Procedure: Mechele Dawley IV    Note: Patient received Ocrevius infusion via PIV. Benadryl and Solu-medrol IVPB given 30 minutes pre-infusion. Infusion titrated per protocol. Patient tolerated well with no adverse reaction. Vital signs stable. Discharge instructions given. Patient alert, oriented and ambulatory at discharge.

## 2018-12-27 ENCOUNTER — Encounter: Payer: Self-pay | Admitting: Neurology

## 2018-12-27 ENCOUNTER — Ambulatory Visit (INDEPENDENT_AMBULATORY_CARE_PROVIDER_SITE_OTHER): Payer: Medicare Other | Admitting: Neurology

## 2018-12-27 ENCOUNTER — Other Ambulatory Visit: Payer: Self-pay

## 2018-12-27 VITALS — BP 95/55 | HR 70 | Temp 98.0°F | Ht 66.0 in | Wt 156.5 lb

## 2018-12-27 DIAGNOSIS — G35 Multiple sclerosis: Secondary | ICD-10-CM

## 2018-12-27 DIAGNOSIS — R252 Cramp and spasm: Secondary | ICD-10-CM

## 2018-12-27 DIAGNOSIS — Z79899 Other long term (current) drug therapy: Secondary | ICD-10-CM | POA: Diagnosis not present

## 2018-12-27 DIAGNOSIS — R4189 Other symptoms and signs involving cognitive functions and awareness: Secondary | ICD-10-CM

## 2018-12-27 DIAGNOSIS — R269 Unspecified abnormalities of gait and mobility: Secondary | ICD-10-CM

## 2018-12-27 MED ORDER — AMPHETAMINE-DEXTROAMPHET ER 20 MG PO CP24: 20.0000 mg | ORAL_CAPSULE | Freq: Every day | ORAL | 0 refills | Status: DC

## 2018-12-27 NOTE — Progress Notes (Signed)
[facility]  PATIENT: Mike Lowery DOB: 06-02-1985  REFERRING DOCTOR OR PCP:  Dr. Hyman HopesJegede SOURCE: Patient, family, records in the EMR, MRI images on PACS  _________________________________   HISTORICAL  CHIEF COMPLAINT:  Chief Complaint  Patient presents with  . Follow-up    RM 12, alone. Last seen 06/28/18. Denies any blurry/double vision. Hard to see out of right eye at nighttime (this is not new)  . Multiple Sclerosis    On Ocrevus. Last infusion: 12[facility]. He does not have next infusion scheduled. I provided him their phone number to call to schedule next one due around 06/08/19.  . Fatigue/ADD    Takes adderall. Needing refill  . Spasticity    Takes baclofen. Feels left leg more shaky, weak. Denies any falls.   . Medication Refill    Requesting refills on hydrocodone and adderall    HISTORY OF PRESENT ILLNESS:  [name],is a 33 y.o. man with multiple sclerosis.    Update 12/27/2018: He is on Ocrevus and tolerates it well.   His last infusion was in July.   He has not had any exacerbation while on it.     CBC was ok.   IgG and IgM were normal though IgA was slightly reduced.      He feels gait is about the same with some stumbles but no falls.   He notes mild left leg weakness.  He does not note much spasticity.  Spasticity was worse last year.  He notes a cramp in his neck at times.  When pain is more severe he takes hydrocodone.  He denies numbness or tingling.   He has had a little urinary leakage after urinating and notes some hesitancy.   No frank incontinence.    Vision is doing well.  He notes mild fatigue that fluctuates a lot.    He sleeps poorly some nights.    He denies depression or anxiety.   He has attention deficit / mental fog, better with Adderall.    Update 06/28/2018: He is on Ocrevus and he tolerates it well.    His last infusion was in December 2019.    He has tolerated it well.   He denies any  exacerbations.    He stumbles now and then but gait has done well fo the most part.    He needs to be careful going down stairs but is fine going up.     The legs are strong.   He denies numbness or tingling in his legs.   Sometimes he has hand tingling.   Vision is doing the same with color desaturation on the right.     Bladder is doing well.   His fatigue is mild.    He has gone back to exercising.    MRI's of the brain and cervical spine 01/01/2018 did not show any new lesions compared to the 07/16/2016 scans.    IgG/IgM 06/23/2017 and CBC/D were fine.   He feels cognition is stable.     Update 12/22/2017: He is on ocrelizumab and is tolerating it well.    His last infusion was in March 2019.      No exacerbatins or new symptoms.   His last brain MRI was March 2018 and it had shown progression compared to the 2016 MRI.  Some of the lesions were enhancing.  He also had a new cervical spinal cord lesion C3-C4 without enhancement.   Blood work at the  last visit showed normal IgG/IgM.  He feels his balance is off.   Gait is spastic but still much better than 2 years ago.    He has some spasticity but no longer gets the phasic spasms.    He has started drinking again and continues to use marijuana.    We discussed that those may worsen his balance and increase risk of falls.   Bladder function is doing well.    Ed is less of a problem.    He has had unprotected sex with multiple partners.  He notes fatigue but less than last year.  MJ worsens his fatigue.   Adderall has helped.        Update 06/23/2017: He feels gait is much better.   His balance is still poor but he does not need a cane.    He denies numbness or weakness now and no longer gets spasms   He denies any problems with bladder.    Vision is worse out of the right eye than the left.      He feels fatigue is helped a lot by Adderall.    He feels focus and attention are also better on Adderall.   Mood is doing better this year than last year. He is  sleeping well at night.   From 12/21/2016: MS:   He had his 2 split doses of ocrelizumab on 08/05/2016 and 08/19/2016 and his second dose  03/17/2017.   He is doing much better. He tolerates the infusions well.    He switched to the ocrelizumab due to large exacerbation in early March 2018. MRI showed multiple enhancing lesions of the brain and 1 new lesion at C4.   He tolerated the ocrelizumab well. He feels much better since the infusion has not had any new symptoms and notes that some of the old symptoms improved   Spasms/spells:    He has tonic spasticity in his legs but no longer has the phasic spasticity. He has no more spells of drawing up in the arm or leg.    Gait/strength/sensation: He feels he is walking better than he did earlier this year when he had an exacerbation. Gait was even worse last year and a 2016 due to his initial exacerbation.    He also notes painful dysesthesias involving the legs and left > right arms..   This has also improved over the past few months.   Gabapentin 3 x 300 mg daily helps  Baclofen is helping the tonic spasticity.    .    Bladder:    He has mild urinary frequency and urgency. There is no incontinence. His ED is better  Fatigue/sleep:    He notes fatigue that is cognitive more than physical. Adderall has helped. He also feels it has helped his focus. He does not take every day.   Mood:  Mood is much better this year than last year and he no longer takes Cymbalta.  Cognition:   He is doing better with improved attention and focus. He still has some word finding difficulties but these are not as bad as they were last year.  Adderall helps his cognitive skills and attention and helps his fatigue and sleepiness a lot.  Tolerates the current dose well.  MS HISTORY:  In mid 2015, he had the onset of weakness and numbness in the arms and legs, a little worse on the left.   When symptoms persisted, he presented to [facility] emergency room. MRI was  consistent with  multiple sclerosis and he was admitted. The MRI of the brain showed several plaques, mostly in the periventricular white matter. The MRI of the cervical spine showed several large T2 hyperintense foci, with some enhancement. He was given IV Solu-Medrol and his symptoms improved quite a bit. He followed up with Dr. Everlena CooperJaffe and was placed on Plegridy about 8 or 9 months after starting Plegridy, he had another exacerbation and MRI of the brain and cervical spine were performed again 02/21/2015. The MRI of the cervical spine does not show any definite new lesions. However, the MRI of the brain does show several new lesions including for small enhancing foci. He was [facility] November or December 2016.   I have personally reviewed the MRIs of the brain and cervical spine from October 2016. The MRI of the brain shows multiple T2/FLAIR hyperintense foci consistent with MS in the hemispheres and brainstem.   4 of the hemispheric small foci enhanced after gadolinium administration. The MRI of the cervical spine shows multiple hyperintense foci. There was a normal enhancement pattern.   He had a large exacerbation while on Tecfidera with new gait difficulties March 2018.   MRI's show multiple new lesions in the brain with several lesions enhancing including a larger one in the left periventrivcular white matter.    Also has a new focus adjacent to C4 in the spinal cord.  He was switched to ocrelizumab and had his first infusion at the end of March 2018   REVIEW OF SYSTEMS: Constitutional: No fevers, chills, sweats, or change in appetite.   He has fatigue and sleepiness. Eyes: No visual changes, double vision, eye pain Ear, nose and throat: No hearing loss, ear pain, nasal congestion, sore throat Cardiovascular: No chest pain, palpitations Respiratory: No shortness of breath at rest or with exertion.   No wheezes GastrointestinaI: No nausea, vomiting, diarrhea, abdominal pain, fecal incontinence  Genitourinary: He notes urinary frequency and ED.    Musculoskeletal: No neck pain, back pain Integumentary: No rash, pruritus, skin lesions Neurological: as above Psychiatric: See above Endocrine: No palpitations, diaphoresis, change in appetite, change in weigh or increased thirst Hematologic/Lymphatic: No anemia, purpura, petechiae. Allergic/Immunologic: No itchy/runny eyes, nasal congestion, recent allergic reactions, rashes  ALLERGIES: No Known Allergies  HOME MEDICATIONS:  Current Outpatient Medications:  .  amphetamine-dextroamphetamine (ADDERALL XR) 20 MG 24 hr capsule, Take 1 capsule (20 mg total) by mouth daily., Disp: 30 capsule, Rfl: 0 .  baclofen (LIORESAL) 10 MG tablet, Take 1 tablet (10 mg total) by mouth 4 (four) times daily as needed for muscle spasms., Disp: 120 tablet, Rfl: 11 .  benzonatate (TESSALON) 100 MG capsule, Take 1 capsule (100 mg total) by mouth every 8 (eight) hours., Disp: 21 capsule, Rfl: 0 .  HYDROcodone-acetaminophen (NORCO/VICODIN) 5-325 MG tablet, Take 1 tablet by mouth every 8 (eight) hours as needed for moderate pain., Disp: 30 tablet, Rfl: 0 .  ibuprofen (ADVIL,MOTRIN) 200 MG tablet, Take 400 mg by mouth every 6 (six) hours as needed for moderate pain. , Disp: , Rfl:  .  ocrelizumab (OCREVUS) 300 MG/10ML injection, Inject 20 mLs (600 mg total) into the vein every 6 (six) months., Disp: 20 mL, Rfl: 0 .  sodium chloride (OCEAN) 0.65 % SOLN nasal spray, Place 1 spray into both nostrils as needed for congestion., Disp: 60 mL, Rfl: 0 .  triamcinolone (NASACORT) 55 MCG/ACT AERO nasal inhaler, Place 2 sprays into the nose daily., Disp: 1 Inhaler, Rfl: 12  PAST  MEDICAL HISTORY: Past Medical History:  Diagnosis Date  . Depression    situaltional  . Dyspnea   . Multiple sclerosis (HCC) 12/24/13  . Multiple sclerosis (HCC)   . Vision abnormalities     PAST SURGICAL HISTORY: Past Surgical History:  Procedure Laterality Date  . NO PAST SURGERIES       FAMILY HISTORY: Family History  Problem Relation Age of Onset  . Healthy Mother   . Healthy Father   . Cancer Maternal Grandmother        unknown   . Ataxia Sister   . Multiple sclerosis Neg Hx     SOCIAL HISTORY:  Social History   Socioeconomic History  . Marital status: Single    Spouse name: Not on file  . Number of children: Not on file  . Years of education: Not on file  . Highest education level: Not on file  Occupational History  . Not on file  Social Needs  . Financial resource strain: Not on file  . Food insecurity    Worry: Not on file    Inability: Not on file  . Transportation needs    Medical: Not on file    Non-medical: Not on file  Tobacco Use  . Smoking status: Former Smoker    Types: Cigarettes    Quit date: 04/18/2013    Years since quitting: 5.6  . Smokeless tobacco: Former Neurosurgeon    Quit date: 09/08/2014  Substance and Sexual Activity  . Alcohol use: Yes  . Drug use: No    Types: Marijuana    Comment: former  . Sexual activity: Yes    Partners: Female  Lifestyle  . Physical activity    Days per week: Not on file    Minutes per session: Not on file  . Stress: Not on file  Relationships  . Social Musician on phone: Not on file    Gets together: Not on file    Attends religious service: Not on file    Active member of club or organization: Not on file    Attends meetings of clubs or organizations: Not on file    Relationship status: Not on file  . Intimate partner violence    Fear of current or ex partner: Not on file    Emotionally abused: Not on file    Physically abused: Not on file    Forced sexual activity: Not on file  Other Topics Concern  . Not on file  Social History Narrative  . Not on file     PHYSICAL EXAM  Vitals:   12/27/18 1530  BP: (!) 95/55  Pulse: 70  Temp: 98 F (36.7 C)  SpO2: 98%  Weight: 156 lb 8 oz (71 kg)  Height:  (1.676 m)    Body mass index is 25.26 kg/m.   General: The  patient is well-developed and well-nourished and in no acute distress   Skin: Extremities are without significant rash or edema.  Neurologic Exam  Mental status: The patient is alert and oriented x 3 at the time of the examination. The patient has apparent normal recent and remote memory, with a reduced attention span and concentration ability.   Speech is normal.  Cranial nerves: Extraocular movements are full.  Colors are desaturated ODS relative to OS. Facial strength and sensation is normal.  Trapezius strength is normal..  The tongue is midline, and the patient has symmetric elevation of the soft palate. No obvious hearing  deficits are noted.  Motor:   Muscle bulk is normal.   He has increased muscle tone in the legs.  Strength is 5/5.  Sensory:  He reports normal and symmetric sensation to touch and vibration in the arms or legs..  Coordination: On cerebellar testing finger-nose-finger is normal.  Heel-to-shin is mildly reduced.  Gait and station: Station is normal.   His gait is slightly spastic.  Tandem gait is wide.    Romberg is now borderline..   Reflexes: Deep tendon reflexes are symmetric and 3 in arms.  He has increased reflexes in the legs but no ankle clonus.      DIAGNOSTIC DATA (LABS, IMAGING, TESTING) - I reviewed patient records, labs, notes, testing and imaging myself where available.  Lab Results  Component Value Date   WBC 5.0 06/28/2018   HGB 13.7 06/28/2018   HCT 40.5 06/28/2018   MCV 88 06/28/2018   PLT 295 06/28/2018      Component Value Date/Time   NA 139 07/16/2016 1520   K 4.1 07/16/2016 1520   CL 106 07/16/2016 1520   CO2 27 07/16/2016 1520   GLUCOSE 117 (H) 07/16/2016 1520   BUN 12 07/16/2016 1520   CREATININE 0.81 07/16/2016 1520   CREATININE 0.92 03/27/2015 1130   CALCIUM 9.3 07/16/2016 1520   PROT 7.0 12/22/2017 1559   ALBUMIN 4.7 12/22/2017 1559   AST 15 12/22/2017 1559   ALT 17 12/22/2017 1559   ALKPHOS 81 12/22/2017 1559   BILITOT  1.0 12/22/2017 1559   GFRNONAA >60 07/16/2016 1520   GFRNONAA >89 10/10/2014 1251   GFRAA >60 07/16/2016 1520   GFRAA >89 10/10/2014 1251       ASSESSMENT AND PLAN    1. Multiple sclerosis (HCC)   2. Disturbed cognition   3. Gait disturbance   4. High risk medication use   5. Spasticity      1.   Continue ocrelizumab for MS. we can hold off on blood work.  Around the time of the next visit we will check an MRI to determine if there is any subclinical activity.. 2.   Renew Adderall for attention deficit and fatigue 3.   Continue baclofen for spasticity 4.   Return to see me in 6 months or sooner if there are new or worsening symptoms.  Tashanna Dolin A. Epimenio FootSater, MD, PhD 12/27/2018, 5:47 PM Certified in Neurology, Clinical Neurophysiology, Sleep Medicine, Pain Medicine and Neuroimaging  [facility] [address], [address] [place], [place] [address] [phone]

## 2019-02-23 ENCOUNTER — Other Ambulatory Visit: Payer: Self-pay | Admitting: Neurology

## 2019-02-23 MED ORDER — AMPHETAMINE-DEXTROAMPHET ER 20 MG PO CP24: 20.0000 mg | ORAL_CAPSULE | Freq: Every day | ORAL | 0 refills | Status: DC

## 2019-02-23 NOTE — Telephone Encounter (Signed)
Reviewed pt chart. He was last seen 12/27/2018 and next f/u 06/29/2019. Checked drug registry and he last refilled rx 12/29/2018 #30.

## 2019-02-23 NOTE — Telephone Encounter (Signed)
Pt has called for a refill on his amphetamine-dextroamphetamine (ADDERALL XR) 20 MG 24 hr capsule [facility] [phone]

## 2019-02-23 NOTE — Addendum Note (Signed)
Addended by: Hope Pigeon on: 02/23/2019 09:48 AM   Modules accepted: Orders

## 2019-03-27 ENCOUNTER — Telehealth: Payer: Self-pay | Admitting: Neurology

## 2019-03-27 ENCOUNTER — Other Ambulatory Visit: Payer: Self-pay | Admitting: Neurology

## 2019-03-27 MED ORDER — AMPHETAMINE-DEXTROAMPHET ER 20 MG PO CP24: 20.0000 mg | ORAL_CAPSULE | Freq: Every day | ORAL | 0 refills | Status: DC

## 2019-03-27 NOTE — Telephone Encounter (Signed)
Pt is requesting a refill of amphetamine-dextroamphetamine (ADDERALL XR) 20 MG 24 hr capsule, to be sent to [place], [place]

## 2019-03-27 NOTE — Telephone Encounter (Signed)
Pt called in and wanted to know when his next infusion appt will be, pt stated he doesn't get his infusions in office he goes elsewhere.Please advise.

## 2019-03-27 NOTE — Telephone Encounter (Signed)
I called pt back. Reminded him that I provided phone number to [facility] at last OV to schedule next infusion around 06/08/2019. He did not do this. I provided phone number [phone] to call and get his infusion scheduled with them. He did not have something to write down number. I offered for him to call back once he was able to write number down but he declined, stated he would remember. Repeated phone number back twice correctly. I encouraged him to call me back if anything further needed.

## 2019-05-08 ENCOUNTER — Ambulatory Visit (HOSPITAL_COMMUNITY): Payer: Medicare Other

## 2019-05-26 ENCOUNTER — Other Ambulatory Visit: Payer: Self-pay

## 2019-05-26 ENCOUNTER — Ambulatory Visit (HOSPITAL_COMMUNITY)
Admission: RE | Admit: 2019-05-26 | Discharge: 2019-05-26 | Disposition: A | Discharge status: Home / Self Care | Payer: Medicare Other | Source: Ambulatory Visit | Attending: Internal Medicine | Admitting: Internal Medicine

## 2019-05-26 DIAGNOSIS — G35 Multiple sclerosis: Secondary | ICD-10-CM | POA: Diagnosis not present

## 2019-05-26 MED ORDER — DIPHENHYDRAMINE HCL 25 MG PO CAPS
50.0000 mg | ORAL_CAPSULE | Freq: Once | ORAL | Status: AC
  Administered 2019-05-26: 50 mg via ORAL
  Filled 2019-05-26: qty 2

## 2019-05-26 MED ORDER — SODIUM CHLORIDE 0.9 % IV SOLN
INTRAVENOUS | Status: DC | PRN
  Administered 2019-05-26: 09:00:00 250 mL via INTRAVENOUS

## 2019-05-26 MED ORDER — SODIUM CHLORIDE 0.9 % IV SOLN
600.0000 mg | Freq: Once | INTRAVENOUS | Status: AC
  Administered 2019-05-26: 10:00:00 600 mg via INTRAVENOUS
  Filled 2019-05-26: qty 20

## 2019-05-26 MED ORDER — SODIUM CHLORIDE 0.9 % IV SOLN
500.0000 mg | Freq: Once | INTRAVENOUS | Status: AC
  Administered 2019-05-26: 09:00:00 500 mg via INTRAVENOUS
  Filled 2019-05-26: qty 4

## 2019-05-26 NOTE — Progress Notes (Signed)
Patient received IV Ocrevus as ordered by Despina Arias MD. Pre-infusion PO Benadryl and IV solumedrol were given. Patient declined to wait for the 60 minutes post infusion observation.Tolerated well, vitals stable, discharge instructions given, verbalized understanding. Patient alert, oriented and ambulatory at the time of discharge.

## 2019-05-26 NOTE — Discharge Instructions (Signed)
Ocrelizumab injection °What is this medicine? °OCRELIZUMAB (ok re LIZ ue mab) treats multiple sclerosis. It helps to decrease the number of multiple sclerosis relapses. It is not a cure. °This medicine may be used for other purposes; ask your health care provider or pharmacist if you have questions. °COMMON BRAND NAME(S): OCREVUS °What should I tell my health care provider before I take this medicine? °They need to know if you have any of these conditions: °· cancer °· hepatitis B infection °· other infection (especially a virus infection such as chickenpox, cold sores, or herpes) °· an unusual or allergic reaction to ocrelizumab, other medicines, foods, dyes or preservatives °· pregnant or trying to get pregnant °· breast-feeding °How should I use this medicine? °This medicine is for infusion into a vein. It is given by a health care professional in a hospital or clinic setting. °A special MedGuide will be given to you before each treatment. Be sure to read this information carefully each time. °Talk to your pediatrician regarding the use of this medicine in children. Special care may be needed. °Overdosage: If you think you have taken too much of this medicine contact a poison control center or emergency room at once. °NOTE: This medicine is only for you. Do not share this medicine with others. °What if I miss a dose? °Keep appointments for follow-up doses as directed. It is important not to miss your dose. Call your doctor or health care professional if you are unable to keep an appointment. °What may interact with this medicine? °· alemtuzumab °· daclizumab °· dimethyl fumarate °· fingolimod °· glatiramer °· interferon beta °· live virus vaccines °· mitoxantrone °· natalizumab °· peginterferon beta °· rituximab °· steroid medicines like prednisone or cortisone °· teriflunomide °This list may not describe all possible interactions. Give your health care provider a list of all the medicines, herbs,  non-prescription drugs, or dietary supplements you use. Also tell them if you smoke, drink alcohol, or use illegal drugs. Some items may interact with your medicine. °What should I watch for while using this medicine? °Tell your doctor or healthcare professional if your symptoms do not start to get better or if they get worse. °This medicine can cause serious allergic reactions. To reduce your risk you may need to take medicine before treatment with this medicine. Take your medicine as directed. °Women should inform their doctor if they wish to become pregnant or think they might be pregnant. There is a potential for serious side effects to an unborn child. Talk to your health care professional or pharmacist for more information. Male patients should use effective birth control methods while receiving this medicine and for 6 months after the last dose. °Call your doctor or health care professional for advice if you get a fever, chills or sore throat, or other symptoms of a cold or flu. Do not treat yourself. This drug decreases your body's ability to fight infections. Try to avoid being around people who are sick. °If you have a hepatitis B infection or a history of a hepatitis B infection, talk to your doctor. The symptoms of hepatitis B may get worse if you take this medicine. °In some patients, this medicine may cause a serious brain infection that may cause death. If you have any problems seeing, thinking, speaking, walking, or standing, tell your doctor right away. If you cannot reach your doctor, urgently seek other source of medical care. °This medicine can decrease the response to a vaccine. If you need to get   vaccinated, tell your healthcare professional if you have received this medicine. Extra booster doses may be needed. Talk to your doctor to see if a different vaccination schedule is needed. °Talk to your doctor about your risk of cancer. You may be more at risk for certain types of cancers if you  take this medicine. °What side effects may I notice from receiving this medicine? °Side effects that you should report to your doctor or health care professional as soon as possible: °· allergic reactions like skin rash, itching or hives, swelling of the face, lips, or tongue °· breathing problems °· facial flushing °· fast, irregular heartbeat °· lump or soreness in the breast °· signs and symptoms of herpes such as cold sore, shingles, or genital sores °· signs and symptoms of infection like fever or chills, cough, sore throat, pain or trouble passing urine °· signs and symptoms of low blood pressure like dizziness; feeling faint or lightheaded, falls; unusually weak or tired °· signs and symptoms of progressive multifocal leukoencephalopathy (PML) like changes in vision; clumsiness; confusion; personality changes; weakness on one side of the body °· swelling of the ankles, feet, hands °Side effects that usually do not require medical attention (report these to your doctor or health care professional if they continue or are bothersome): °· back pain °· depressed mood °· diarrhea °· pain, redness, or irritation at site where injected °This list may not describe all possible side effects. Call your doctor for medical advice about side effects. You may report side effects to FDA at 1-800-FDA-1088. °Where should I keep my medicine? °This drug is given in a hospital or clinic and will not be stored at home. °NOTE: This sheet is a summary. It may not cover all possible information. If you have questions about this medicine, talk to your doctor, pharmacist, or health care provider. °© 2020 [employer] (2018-05-09 07:41:53) ° °

## 2019-06-08 ENCOUNTER — Telehealth: Payer: Self-pay | Admitting: Family Medicine

## 2019-06-08 NOTE — Telephone Encounter (Signed)
LVM to r/s 2/11 appt due to NP being out

## 2019-06-09 ENCOUNTER — Encounter: Payer: Self-pay | Admitting: Family Medicine

## 2019-06-09 NOTE — Telephone Encounter (Signed)
lvm x2 to r/s 2/11 appt. C/a letter sent

## 2019-06-20 ENCOUNTER — Telehealth: Payer: Self-pay | Admitting: *Deleted

## 2019-06-20 NOTE — Telephone Encounter (Signed)
Called pt. Scheduled f/u with AL,NP 06/22/19 at 830am. Advised he is due for visit/labs. He also needs to sign updated consent form from [employer] since they updated their form.

## 2019-06-21 NOTE — Progress Notes (Signed)
PATIENT: Mike Lowery DOB: 07-20-1985  REASON FOR VISIT: follow up HISTORY FROM: patient  Chief Complaint  Patient presents with  . Follow-up    RM1. Alone. States that every thing in about the same. No concerns.     HISTORY OF PRESENT ILLNESS: Today 06/22/19 Mike Lowery is a 34 y.o. male here today for follow up for MS. He continues Ocrevus (started in 07/2016) and is tolerating well. Last infusion was in December, 2020. He feels MS is stable. No exacerbating symptoms. He continues Adderall XR 20mg  daily for fatigue. Baclofen helps with stiffness and spasticity but he rarely takes it. Last MRI cervical spine and brain stable in 12/2017. He reports a fall yesterday where he tripped carrying the trash out. He denies weakness or changes in gait. He did scrape his toe but no other injuries. No vision changes. No changes with bowel or bladder.   HISTORY: (copied from Dr Bonnita Hollow note on 12/27/2018)  Mike Lowery,is a 34 y.o. man with multiple sclerosis.    Update 12/27/2018: He is on Ocrevus and tolerates it well.   His last infusion was in July.   He has not had any exacerbation while on it.     CBC was ok.   IgG and IgM were normal though IgA was slightly reduced.      He feels gait is about the same with some stumbles but no falls.   He notes mild left leg weakness.  He does not note much spasticity.  Spasticity was worse last year.  He notes a cramp in his neck at times.  When pain is more severe he takes hydrocodone.  He denies numbness or tingling.   He has had a little urinary leakage after urinating and notes some hesitancy.   No frank incontinence.    Vision is doing well.  He notes mild fatigue that fluctuates a lot.    He sleeps poorly some nights.    He denies depression or anxiety.   He has attention deficit / mental fog, better with Adderall.    Update 06/28/2018: He is on Ocrevus and he tolerates it well.    His last infusion was in December 2019.    He has tolerated  it well.   He denies any exacerbations.    He stumbles now and then but gait has done well fo the most part.    He needs to be careful going down stairs but is fine going up.     The legs are strong.   He denies numbness or tingling in his legs.   Sometimes he has hand tingling.   Vision is doing the same with color desaturation on the right.     Bladder is doing well.   His fatigue is mild.    He has gone back to exercising.    MRI's of the brain and cervical spine 01/01/2018 did not show any new lesions compared to the 07/16/2016 scans.    IgG/IgM 06/23/2017 and CBC/D were fine.   He feels cognition is stable.     Update 12/22/2017: He is on ocrelizumab and is tolerating it well.    His last infusion was in March 2019.      No exacerbatins or new symptoms.   His last brain MRI was March 2018 and it had shown progression compared to the 2016 MRI.  Some of the lesions were enhancing.  He also had a new cervical spinal cord lesion C3-C4 without enhancement.  Blood work at the last visit showed normal IgG/IgM.  He feels his balance is off.   Gait is spastic but still much better than 2 years ago.    He has some spasticity but no longer gets the phasic spasms.    He has started drinking again and continues to use marijuana.    We discussed that those may worsen his balance and increase risk of falls.   Bladder function is doing well.    Ed is less of a problem.    He has had unprotected sex with multiple partners.  He notes fatigue but less than last year.  MJ worsens his fatigue.   Adderall has helped.        Update 06/23/2017: He feels gait is much better.   His balance is still poor but he does not need a cane.    He denies numbness or weakness now and no longer gets spasms   He denies any problems with bladder.    Vision is worse out of the right eye than the left.      He feels fatigue is helped a lot by Adderall.    He feels focus and attention are also better on Adderall.   Mood is doing better  this year than last year. He is sleeping well at night.   From 12/21/2016: MS:   He had his 2 split doses of ocrelizumab on 08/05/2016 and 08/19/2016 and his second dose  03/17/2017.   He is doing much better. He tolerates the infusions well.    He switched to the ocrelizumab due to large exacerbation in early March 2018. MRI showed multiple enhancing lesions of the brain and 1 new lesion at C4.   He tolerated the ocrelizumab well. He feels much better since the infusion has not had any new symptoms and notes that some of the old symptoms improved   Spasms/spells:    He has tonic spasticity in his legs but no longer has the phasic spasticity. He has no more spells of drawing up in the arm or leg.    Gait/strength/sensation: He feels he is walking better than he did earlier this year when he had an exacerbation. Gait was even worse last year and a 2016 due to his initial exacerbation.    He also notes painful dysesthesias involving the legs and left > right arms..   This has also improved over the past few months.   Gabapentin 3 x 300 mg daily helps  Baclofen is helping the tonic spasticity.    .    Bladder:    He has mild urinary frequency and urgency. There is no incontinence. His ED is better  Fatigue/sleep:    He notes fatigue that is cognitive more than physical. Adderall has helped. He also feels it has helped his focus. He does not take every day.   Mood:  Mood is much better this year than last year and he no longer takes Cymbalta.  Cognition:   He is doing better with improved attention and focus. He still has some word finding difficulties but these are not as bad as they were last year.  Adderall helps his cognitive skills and attention and helps his fatigue and sleepiness a lot.  Tolerates the current dose well.  MS HISTORY:  In mid 2015, he had the onset of weakness and numbness in the arms and legs, a little worse on the left.   When symptoms persisted, he presented to [facility]  emergency room. MRI was consistent with multiple sclerosis and he was admitted. The MRI of the brain showed several plaques, mostly in the periventricular white matter. The MRI of the cervical spine showed several large T2 hyperintense foci, with some enhancement. He was given IV Solu-Medrol and his symptoms improved quite a bit. He followed up with Dr. Everlena Cooper and was placed on Plegridy about 8 or 9 months after starting Plegridy, he had another exacerbation and MRI of the brain and cervical spine were performed again 02/21/2015. The MRI of the cervical spine does not show any definite new lesions. However, the MRI of the brain does show several new lesions including for small enhancing foci. He was switched to [facility] November or December 2016.   I have personally reviewed the MRIs of the brain and cervical spine from October 2016. The MRI of the brain shows multiple T2/FLAIR hyperintense foci consistent with MS in the hemispheres and brainstem.   4 of the hemispheric small foci enhanced after gadolinium administration. The MRI of the cervical spine shows multiple hyperintense foci. There was a normal enhancement pattern.   He had a large exacerbation while on Tecfidera with new gait difficulties March 2018.   MRI's show multiple new lesions in the brain with several lesions enhancing including a larger one in the left periventrivcular white matter.    Also has a new focus adjacent to C4 in the spinal cord.  He was switched to ocrelizumab and had his first infusion at the end of March 2018    REVIEW OF SYSTEMS: Out of a complete 14 system review of symptoms, the patient complains only of the following symptoms, none and all other reviewed systems are negative.  ALLERGIES: No Known Allergies  HOME MEDICATIONS: Outpatient Medications Prior to Visit  Medication Sig Dispense Refill  . baclofen (LIORESAL) 10 MG tablet Take 1 tablet (10 mg total) by mouth 4 (four) times daily as needed for muscle spasms. 120  tablet 11  . benzonatate (TESSALON) 100 MG capsule Take 1 capsule (100 mg total) by mouth every 8 (eight) hours. 21 capsule 0  . ibuprofen (ADVIL,MOTRIN) 200 MG tablet Take 400 mg by mouth every 6 (six) hours as needed for moderate pain.     Marland Kitchen ocrelizumab (OCREVUS) 300 MG/10ML injection Inject 20 mLs (600 mg total) into the vein every 6 (six) months. 20 mL 0  . sodium chloride (OCEAN) 0.65 % SOLN nasal spray Place 1 spray into both nostrils as needed for congestion. 60 mL 0  . amphetamine-dextroamphetamine (ADDERALL XR) 20 MG 24 hr capsule Take 1 capsule (20 mg total) by mouth daily. 30 capsule 0  . triamcinolone (NASACORT) 55 MCG/ACT AERO nasal inhaler Place 2 sprays into the nose daily. 1 Inhaler 12  . HYDROcodone-acetaminophen (NORCO/VICODIN) 5-325 MG tablet Take 1 tablet by mouth every 8 (eight) hours as needed for moderate pain. 30 tablet 0   No facility-administered medications prior to visit.    PAST MEDICAL HISTORY: Past Medical History:  Diagnosis Date  . Depression    situaltional  . Dyspnea   . Multiple sclerosis (HCC) 12/24/13  . Multiple sclerosis (HCC)   . Vision abnormalities     PAST SURGICAL HISTORY: Past Surgical History:  Procedure Laterality Date  . NO PAST SURGERIES      FAMILY HISTORY: Family History  Problem Relation Age of Onset  . Healthy Mother   . Healthy Father   . Cancer Maternal Grandmother        unknown   .  Ataxia Sister   . Multiple sclerosis Neg Hx     SOCIAL HISTORY: Social History   Socioeconomic History  . Marital status: Single    Spouse name: Not on file  . Number of children: Not on file  . Years of education: Not on file  . Highest education level: Not on file  Occupational History  . Not on file  Tobacco Use  . Smoking status: Former Smoker    Types: Cigarettes    Quit date: 04/18/2013    Years since quitting: 6.1  . Smokeless tobacco: Former Systems developer    Quit date: 09/08/2014  Substance and Sexual Activity  . Alcohol use:  Yes  . Drug use: No    Types: Marijuana    Comment: former  . Sexual activity: Yes    Partners: Female  Other Topics Concern  . Not on file  Social History Narrative  . Not on file   Social Determinants of Health   Financial Resource Strain:   . Difficulty of Paying Living Expenses: Not on file  Food Insecurity:   . Worried About Charity fundraiser in the Last Year: Not on file  . Ran Out of Food in the Last Year: Not on file  Transportation Needs:   . Lack of Transportation (Medical): Not on file  . Lack of Transportation (Non-Medical): Not on file  Physical Activity:   . Days of Exercise per Week: Not on file  . Minutes of Exercise per Session: Not on file  Stress:   . Feeling of Stress : Not on file  Social Connections:   . Frequency of Communication with Friends and Family: Not on file  . Frequency of Social Gatherings with Friends and Family: Not on file  . Attends Religious Services: Not on file  . Active Member of Clubs or Organizations: Not on file  . Attends Archivist Meetings: Not on file  . Marital Status: Not on file  Intimate Partner Violence:   . Fear of Current or Ex-Partner: Not on file  . Emotionally Abused: Not on file  . Physically Abused: Not on file  . Sexually Abused: Not on file      PHYSICAL EXAM  Vitals:   06/22/19 0817  BP: 115/62  Pulse: 63  Temp: 98.4 F (36.9 C)  Weight: 151 lb (68.5 kg)  Height: 5\' 6"  (1.676 m)   Body mass index is 24.37 kg/m.  Generalized: Well developed, in no acute distress  Cardiology: normal rate and rhythm, no murmur noted Respiratory: clear to auscultation bilaterally  Neurological examination  Mentation: Alert oriented to time, place, history taking. Follows all commands speech and language fluent Cranial nerve II-XII: Pupils were equal round reactive to light. Extraocular movements were full, visual field were full on confrontational test. Facial sensation and strength were normal. Uvula  tongue midline. Head turning and shoulder shrug  were normal and symmetric. Motor: The motor testing reveals 5 over 5 strength of all 4 extremities. Good symmetric motor tone is noted throughout.  Sensory: Sensory testing is intact to soft touch on all 4 extremities. No evidence of extinction is noted.  Coordination: Cerebellar testing reveals good finger-nose-finger and heel-to-shin bilaterally.  Gait and station: Gait is normal.  *small abrasion to external tip of right great toe, old blood noted, no active bleeding, no signs of infection noted   DIAGNOSTIC DATA (LABS, IMAGING, TESTING) - I reviewed patient records, labs, notes, testing and imaging myself where available.  No flowsheet data  found.   Lab Results  Component Value Date   WBC 5.0 06/28/2018   HGB 13.7 06/28/2018   HCT 40.5 06/28/2018   MCV 88 06/28/2018   PLT 295 06/28/2018      Component Value Date/Time   NA 139 07/16/2016 1520   K 4.1 07/16/2016 1520   CL 106 07/16/2016 1520   CO2 27 07/16/2016 1520   GLUCOSE 117 (H) 07/16/2016 1520   BUN 12 07/16/2016 1520   CREATININE 0.81 07/16/2016 1520   CREATININE 0.92 03/27/2015 1130   CALCIUM 9.3 07/16/2016 1520   PROT 7.0 12/22/2017 1559   ALBUMIN 4.7 12/22/2017 1559   AST 15 12/22/2017 1559   ALT 17 12/22/2017 1559   ALKPHOS 81 12/22/2017 1559   BILITOT 1.0 12/22/2017 1559   GFRNONAA >60 07/16/2016 1520   GFRNONAA >89 10/10/2014 1251   GFRAA >60 07/16/2016 1520   GFRAA >89 10/10/2014 1251   No results found for: CHOL, HDL, LDLCALC, LDLDIRECT, TRIG, CHOLHDL No results found for: DPOE4M No results found for: VITAMINB12 No results found for: TSH     ASSESSMENT AND PLAN 34 y.o. year old male  has a past medical history of Depression, Dyspnea, Multiple sclerosis (HCC) (12/24/13), Multiple sclerosis (HCC), and Vision abnormalities. here with     ICD-10-CM   1. Relapsing remitting multiple sclerosis (HCC)  G35 CBC with Differential/Platelets    CMP    IgG,  IgA, IgM    Mr Branan is doing very well on Ocrevus. No new or worsening symptoms. We will continue current treatment regimen. I have called in lidocaine cream for pain relief of right great toe scrape. He was educated on appropriate wound care and to monitor for infection. We will update labs today. He will return in 6 months for follow up. He verbalizes understanding and agreement with this plan.    Orders Placed This Encounter  Procedures  . CBC with Differential/Platelets  . CMP  . IgG, IgA, IgM     Meds ordered this encounter  Medications  . lidocaine (LMX) 4 % cream    Sig: Apply 1 application topically as needed.    Dispense:  30 g    Refill:  0    Order Specific Question:   Supervising Provider    Answer:   Anson Fret [id number]  . amphetamine-dextroamphetamine (ADDERALL XR) 20 MG 24 hr capsule    Sig: Take 1 capsule (20 mg total) by mouth daily.    Dispense:  30 capsule    Refill:  0    Order Specific Question:   Supervising Provider    Answer:   Anson Fret [id number]      I spent 20 minutes with the patient. 50% of this time was spent counseling and educating patient on plan of care and medications.    Shawnie Dapper, FNP-C 06/22/2019, 8:57 AM [facility] [address] [place], [place] [address] [phone]

## 2019-06-21 NOTE — Patient Instructions (Addendum)
Continue Ocrevus infusion as directed.   Adderall daily as prescribed  Baclofen as needed  Lidocaine cream for toe pain   Stay active and drink plenty of water   Folow up in 6 months   Multiple Sclerosis Multiple sclerosis (MS) is a disease of the brain, spinal cord, and optic nerves (central nervous system). It causes the body's disease-fighting (immune) system to destroy the protective covering (myelin sheath) around nerves in the brain. When this happens, signals (nerve impulses) going to and from the brain and spinal cord do not get sent properly or may not get sent at all. There are several types of MS:  Relapsing-remitting MS. This is the most common type. This causes sudden attacks of symptoms. After an attack, you may recover completely until the next attack, or some symptoms may remain permanently.  Secondary progressive MS. This usually develops after the onset of relapsing-remitting MS. Similar to relapsing-remitting MS, this type also causes sudden attacks of symptoms. Attacks may be less frequent, but symptoms slowly get worse (progress) over time.  Primary progressive MS. This causes symptoms that steadily progress over time. This type of MS does not cause sudden attacks of symptoms. The age of onset of MS varies, but it often develops between 59-49 years of age. MS is a lifelong (chronic) condition. There is no cure, but treatment can help slow down the progression of the disease. What are the causes? The cause of this condition is not known. What increases the risk? You are more likely to develop this condition if:  You are a woman.  You have a relative with MS. However, the condition is not passed from parent to child (inherited).  You have a lack (deficiency) of vitamin D.  You smoke. MS is more common in the [place] than in the [place]. What are the signs or symptoms? Relapsing-remitting and secondary progressive MS cause symptoms  to occur in episodes or attacks that may last weeks to months. There may be long periods between attacks in which there are almost no symptoms. Primary progressive MS causes symptoms to steadily progress after they develop. Symptoms of MS vary because of the many different ways it affects the central nervous system. The main symptoms include:  Vision problems and eye pain.  Numbness.  Weakness.  Inability to move your arms, hands, feet, or legs (paralysis).  Balance problems.  Shaking that you cannot control (tremors).  Muscle spasms.  Problems with thinking (cognitive changes). MS can also cause symptoms that are associated with the disease, but are not always the direct result of an MS attack. They may include:  Inability to control urination or bowel movements (incontinence).  Headaches.  Fatigue.  Inability to tolerate heat.  Emotional changes.  Depression.  Pain. How is this diagnosed? This condition is diagnosed based on:  Your symptoms.  A neurological exam. This involves checking central nervous system function, such as nerve function, reflexes, and coordination.  MRIs of the brain and spinal cord.  Lab tests, including a lumbar puncture that tests the fluid that surrounds the brain and spinal cord (cerebrospinal fluid).  Tests to measure the electrical activity of the brain in response to stimulation (evoked potentials). How is this treated? There is no cure for MS, but medicines can help decrease the number and frequency of attacks and help relieve nuisance symptoms. Treatment options may include:  Medicines that reduce the frequency of attacks. These medicines may be given by injection, by mouth (orally), or  through an IV.  Medicines that reduce inflammation (steroids). These may provide short-term relief of symptoms.  Medicines to help control pain, depression, fatigue, or incontinence.  Vitamin D, if you have a deficiency.  Using devices to help  you move around (assistive devices), such as braces, a cane, or a walker.  Physical therapy to strengthen and stretch your muscles.  Occupational therapy to help you with everyday tasks.  Alternative or complementary treatments such as exercise, massage, or acupuncture. Follow these instructions at home:  Take over-the-counter and prescription medicines only as told by your health care provider.  Do not drive or use heavy machinery while taking prescription pain medicine.  Use assistive devices as recommended by your physical therapist or your health care provider.  Exercise as directed by your health care provider.  Return to your normal activities as told by your health care provider. Ask your health care provider what activities are safe for you.  Reach out for support. Share your feelings with friends, family, or a support group.  Keep all follow-up visits as told by your health care provider and therapists. This is important. Where to find more information  National Multiple Sclerosis Society: https://www.nationalmssociety.org Contact a health care provider if:  You feel depressed.  You develop new pain or numbness.  You have tremors.  You have problems with sexual function. Get help right away if:  You develop paralysis.  You develop numbness.  You have problems with your bladder or bowel function.  You develop double vision.  You lose vision in one or both eyes.  You develop suicidal thoughts.  You develop severe confusion. If you ever feel like you may hurt yourself or others, or have thoughts about taking your own life, get help right away. You can go to your nearest emergency department or call:  Your local emergency services (911 in the [place]  A suicide crisis helpline, such as the [place] at (202)711-8385. This is open 24 hours a day. Summary  Multiple sclerosis (MS) is a disease of the central nervous system that  causes the body's immune system to destroy the protective covering (myelin sheath) around nerves in the brain.  There are 3 types of MS: relapsing-remitting, secondary progressive, and primary progressive. Relapsing-remitting and secondary progressive MS cause symptoms to occur in episodes or attacks that may last weeks to months. Primary progressive MS causes symptoms to steadily progress after they develop.  There is no cure for MS, but medicines can help decrease the number and frequency of attacks and help relieve nuisance symptoms. Treatment may also include physical or occupational therapy.  If you develop numbness, paralysis, vision problems, or other neurological symptoms, get help right away. This information is not intended to replace advice given to you by your health care provider. Make sure you discuss any questions you have with your health care provider. Document Revised: 04/16/2017 Document Reviewed: 07/13/2016 Elsevier Patient Education  2020 [employer].

## 2019-06-22 ENCOUNTER — Other Ambulatory Visit: Payer: Self-pay

## 2019-06-22 ENCOUNTER — Ambulatory Visit (INDEPENDENT_AMBULATORY_CARE_PROVIDER_SITE_OTHER): Payer: Medicare Other | Admitting: Family Medicine

## 2019-06-22 ENCOUNTER — Encounter: Payer: Self-pay | Admitting: Family Medicine

## 2019-06-22 VITALS — BP 115/62 | HR 63 | Temp 98.4°F | Ht 66.0 in | Wt 151.0 lb

## 2019-06-22 DIAGNOSIS — G35 Multiple sclerosis: Secondary | ICD-10-CM

## 2019-06-22 MED ORDER — AMPHETAMINE-DEXTROAMPHET ER 20 MG PO CP24: 20.0000 mg | ORAL_CAPSULE | Freq: Every day | ORAL | 0 refills | Status: DC

## 2019-06-22 MED ORDER — LIDOCAINE 4 % EX CREA: 1.0000 "application " | TOPICAL_CREAM | CUTANEOUS | 0 refills | Status: DC | PRN

## 2019-06-23 LAB — COMPREHENSIVE METABOLIC PANEL
ALT: 22 IU/L (ref 0–44)
AST: 14 IU/L (ref 0–40)
Albumin/Globulin Ratio: 2.1 (ref 1.2–2.2)
Albumin: 4.2 g/dL (ref 4.0–5.0)
Alkaline Phosphatase: 82 IU/L (ref 39–117)
BUN/Creatinine Ratio: 8 — ABNORMAL LOW (ref 9–20)
BUN: 9 mg/dL (ref 6–20)
Bilirubin Total: 0.2 mg/dL (ref 0.0–1.2)
CO2: 21 mmol/L (ref 20–29)
Calcium: 8.8 mg/dL (ref 8.7–10.2)
Chloride: 108 mmol/L — ABNORMAL HIGH (ref 96–106)
Creatinine, Ser: 1.1 mg/dL (ref 0.76–1.27)
GFR calc Af Amer: 103 mL/min/{1.73_m2} (ref >59)
GFR calc non Af Amer: 89 mL/min/{1.73_m2} (ref >59)
Globulin, Total: 2 g/dL (ref 1.5–4.5)
Glucose: 85 mg/dL (ref 65–99)
Potassium: 4 mmol/L (ref 3.5–5.2)
Sodium: 145 mmol/L — ABNORMAL HIGH (ref 134–144)
Total Protein: 6.2 g/dL (ref 6.0–8.5)

## 2019-06-23 LAB — CBC WITH DIFFERENTIAL/PLATELET
Basophils Absolute: 0 10*3/uL (ref 0.0–0.2)
Basos: 1 %
EOS (ABSOLUTE): 0.1 10*3/uL (ref 0.0–0.4)
Eos: 3 %
Hematocrit: 42.2 % (ref 37.5–51.0)
Hemoglobin: 14.3 g/dL (ref 13.0–17.7)
Immature Grans (Abs): 0 10*3/uL (ref 0.0–0.1)
Immature Granulocytes: 0 %
Lymphocytes Absolute: 2.2 10*3/uL (ref 0.7–3.1)
Lymphs: 53 %
MCH: 29.8 pg (ref 26.6–33.0)
MCHC: 33.9 g/dL (ref 31.5–35.7)
MCV: 88 fL (ref 79–97)
Monocytes Absolute: 0.6 10*3/uL (ref 0.1–0.9)
Monocytes: 14 %
Neutrophils Absolute: 1.2 10*3/uL — ABNORMAL LOW (ref 1.4–7.0)
Neutrophils: 29 %
Platelets: 274 10*3/uL (ref 150–450)
RBC: 4.8 x10E6/uL (ref 4.14–5.80)
RDW: 12.5 % (ref 11.6–15.4)
WBC: 4 10*3/uL (ref 3.4–10.8)

## 2019-06-23 LAB — IGG, IGA, IGM
IgA/Immunoglobulin A, Serum: 80 mg/dL — ABNORMAL LOW (ref 90–386)
IgG (Immunoglobin G), Serum: 793 mg/dL (ref 603–1613)
IgM (Immunoglobulin M), Srm: 19 mg/dL — ABNORMAL LOW (ref 20–172)

## 2019-06-23 NOTE — Progress Notes (Signed)
I have read the note, and I agree with the clinical assessment and plan.  Minnette Merida A. Zarai Orsborn, MD, PhD, FAAN Certified in Neurology, Clinical Neurophysiology, Sleep Medicine, Pain Medicine and Neuroimaging  [facility] [address], [address] [place], [place] [address] [phone]  

## 2019-06-27 ENCOUNTER — Telehealth: Payer: Self-pay

## 2019-06-27 ENCOUNTER — Telehealth: Payer: Self-pay | Admitting: *Deleted

## 2019-06-27 NOTE — Telephone Encounter (Signed)
Received fax from [employer]cre[employer]us [employer] that Ocrevus start form on file expired. We faxed updated/signed start form to them at [phone]. Received fax confirmation x2.

## 2019-06-27 NOTE — Telephone Encounter (Signed)
Was able to call and inform the pt regarding the previous message. Patient demonstrates understanding and had no questions

## 2019-06-27 NOTE — Telephone Encounter (Signed)
Received a Fax from [employer] stating that the "plan does not cover medication proscribed,"  Original Lidocaine 4% Cream 30gm  Checked on GoodRx ... A tube of 28.35gm of 3% per 1 tube: Karin Golden: $17.70 w/ coupon -[facility] $17.70 w/ coupon  Will route to Amy to see if she is okay with this option (the different strength)

## 2019-06-27 NOTE — Telephone Encounter (Signed)
Please let patient know this information. TY.

## 2019-06-28 NOTE — Telephone Encounter (Signed)
Signed Ocrevus forms to intrafusion, Liane.

## 2019-06-29 ENCOUNTER — Ambulatory Visit: Payer: Medicare Other | Admitting: Family Medicine

## 2019-07-19 DIAGNOSIS — H04123 Dry eye syndrome of bilateral lacrimal glands: Secondary | ICD-10-CM | POA: Diagnosis not present

## 2019-07-19 DIAGNOSIS — H40033 Anatomical narrow angle, bilateral: Secondary | ICD-10-CM | POA: Diagnosis not present

## 2019-07-26 ENCOUNTER — Other Ambulatory Visit: Payer: Self-pay | Admitting: Family Medicine

## 2019-07-26 MED ORDER — AMPHETAMINE-DEXTROAMPHET ER 20 MG PO CP24: 20.0000 mg | ORAL_CAPSULE | Freq: Every day | ORAL | 0 refills | Status: DC

## 2019-07-26 NOTE — Telephone Encounter (Signed)
1) Medication(s) Requested (by name): amphetamine-dextroamphetamine (ADDERALL XR) 20 MG 24 hr capsule   2) Pharmacy of Choice: [facility] [phone] - [place], [place] - [address]  [address],

## 2019-08-28 ENCOUNTER — Other Ambulatory Visit: Payer: Self-pay | Admitting: Family Medicine

## 2019-08-28 MED ORDER — AMPHETAMINE-DEXTROAMPHET ER 20 MG PO CP24: 20.0000 mg | ORAL_CAPSULE | Freq: Every day | ORAL | 0 refills | Status: DC

## 2019-08-28 NOTE — Telephone Encounter (Signed)
1) Medication(s) Requested (by name): amphetamine-dextroamphetamine (ADDERALL XR) 20 MG 24 hr capsule   2) Pharmacy of Choice: [facility] [phone] - [place], [place] - [address] AT [facility] [address]  [address], [place] [address]

## 2019-09-28 ENCOUNTER — Other Ambulatory Visit: Payer: Self-pay | Admitting: Neurology

## 2019-09-28 MED ORDER — AMPHETAMINE-DEXTROAMPHET ER 20 MG PO CP24: 20.0000 mg | ORAL_CAPSULE | Freq: Every day | ORAL | 0 refills | Status: DC

## 2019-09-28 NOTE — Telephone Encounter (Signed)
Pt is needing a refill on his amphetamine-dextroamphetamine (ADDERALL XR) 20 MG 24 hr capsule sent in to the [facility] on [address].

## 2019-10-17 ENCOUNTER — Telehealth: Payer: Self-pay

## 2019-10-17 NOTE — Telephone Encounter (Signed)
I called [employer] to verify that the PA for ocrevus completed in April of 2020 was still valid. Spoke to [employer]. She reports that she ran a claim and it did go through as a paid claim. The approval # is [id number].

## 2019-10-23 NOTE — Telephone Encounter (Addendum)
Last week I reached out to [facility] to check the status of pt's next ocrevus infusion since it has not been scheduled. Geraldine Contras has not responded.  I called [facility]. They are transferring all of their patients to [facility] for infusions beginning in July. Pt should call [phone] to schedule his infusion.  I called pt. No answer, left a message asking him to call me back. If pt calls back please ask him to call [facility] at [phone] to schedule his ocrevus infusion that is due the beginning of July.

## 2019-11-27 ENCOUNTER — Other Ambulatory Visit: Payer: Self-pay | Admitting: Family Medicine

## 2019-11-27 MED ORDER — AMPHETAMINE-DEXTROAMPHET ER 20 MG PO CP24: 20.0000 mg | ORAL_CAPSULE | Freq: Every day | ORAL | 0 refills | Status: DC

## 2019-11-27 NOTE — Telephone Encounter (Signed)
I called pt. No answer, left a message asking him to call me back. If pt calls back please ask him to call [facility] at [phone] to schedule his ocrevus infusion that is due the beginning of July.

## 2019-11-27 NOTE — Telephone Encounter (Addendum)
Pt left VM with our office returning my call.  I called pt. No answer, left a message per DPR advising him to call [facility] at [phone] to schedule his ocrevus infusion and to call me back with any questions or concerns.

## 2019-11-27 NOTE — Telephone Encounter (Signed)
Pt needing refill on his amphetamine-dextroamphetamine (ADDERALL XR) 20 MG 24 hr capsule sent to [facility] on [address]

## 2019-12-21 ENCOUNTER — Telehealth: Payer: Self-pay | Admitting: Neurology

## 2019-12-21 ENCOUNTER — Ambulatory Visit (INDEPENDENT_AMBULATORY_CARE_PROVIDER_SITE_OTHER): Payer: Medicare Other | Admitting: Neurology

## 2019-12-21 ENCOUNTER — Other Ambulatory Visit: Payer: Self-pay

## 2019-12-21 ENCOUNTER — Encounter: Payer: Self-pay | Admitting: Neurology

## 2019-12-21 VITALS — BP 100/70 | HR 64 | Ht 66.0 in | Wt 151.0 lb

## 2019-12-21 DIAGNOSIS — R5383 Other fatigue: Secondary | ICD-10-CM | POA: Diagnosis not present

## 2019-12-21 DIAGNOSIS — G35 Multiple sclerosis: Secondary | ICD-10-CM

## 2019-12-21 DIAGNOSIS — R269 Unspecified abnormalities of gait and mobility: Secondary | ICD-10-CM | POA: Diagnosis not present

## 2019-12-21 DIAGNOSIS — Z79899 Other long term (current) drug therapy: Secondary | ICD-10-CM | POA: Diagnosis not present

## 2019-12-21 DIAGNOSIS — R799 Abnormal finding of blood chemistry, unspecified: Secondary | ICD-10-CM | POA: Diagnosis not present

## 2019-12-21 DIAGNOSIS — R897 Abnormal histological findings in specimens from other organs, systems and tissues: Secondary | ICD-10-CM | POA: Diagnosis not present

## 2019-12-21 MED ORDER — AMPHETAMINE-DEXTROAMPHET ER 20 MG PO CP24: 20.0000 mg | ORAL_CAPSULE | Freq: Every day | ORAL | 0 refills | Status: DC

## 2019-12-21 MED ORDER — BACLOFEN 10 MG PO TABS: 10.0000 mg | ORAL_TABLET | Freq: Four times a day (QID) | ORAL | 11 refills | Status: DC | PRN

## 2019-12-21 NOTE — Telephone Encounter (Signed)
Medicare/medicaid order sent to [facility]. No auth they will reach out to the patient to schedule.  °

## 2019-12-21 NOTE — Progress Notes (Signed)
[facility]  PATIENT: Mike MCLAWHORN DOB: 01/27/1986  REFERRING DOCTOR OR PCP:  Dr. Hyman Hopes SOURCE: Patient, family, records in the EMR, MRI images on PACS  _________________________________   HISTORICAL  CHIEF COMPLAINT:  Chief Complaint  Patient presents with  . Follow-up    RM 12, alone. Last seen 06/22/2019.   . Multiple Sclerosis    On Ocrevus. Last: 05/26/19. He was due in July but has been unable to reach to r/s this. Receives at [employer] [facility]. Provided contact info to pt for him to call and get infusion scheduled. He called while in the office to get infusion r/s    HISTORY OF PRESENT ILLNESS:  Mike Lowery,is a 34 y.o. man with relapsing remitting multiple sclerosis.    Update 12/21/2019: He is on Ocrevus and tolerates it well.   His last infusion was in January 2021 and he is scheduled for next one in a week.    He has not had any exacerbation while on it.     CBC was ok.   IgM and IgA were slightly reduced at last visit  He feels gait is better and he is walking about 1/4 mile without stopping.   He has some stumbling but no falls.   He has noted spasticity mostly in his left leg.   Left leg is weaker.  Marland KitchenHe notes some neck pain.    He denies numbness or tingling.  He has mild urinary hesitancy..   No frank incontinence.    Vision is doing well.  He notes mild fatigue that fluctuates a lot.  This has improved as he became more active.  He sleeps poorly some nights.    He denies depression or anxiety.   He has attention deficit / mental fog, better with Adderall.    He has not had his vaccination but he will consider it.   We discussed best timing is about 4 1/2 months after infusion.  MS HISTORY:  In mid 2015, he had the onset of weakness and numbness in the arms and legs, a little worse on the left.   When symptoms persisted, he presented to [facility] emergency room. MRI was consistent with multiple sclerosis and he was admitted. The MRI of  the brain showed several plaques, mostly in the periventricular white matter. The MRI of the cervical spine showed several large T2 hyperintense foci, with some enhancement. He was given IV Solu-Medrol and his symptoms improved quite a bit. He followed up with Dr. Everlena Cooper and was placed on Plegridy about 8 or 9 months after starting Plegridy, he had another exacerbation and MRI of the brain and cervical spine were performed again 02/21/2015. The MRI of the cervical spine does not show any definite new lesions. However, the MRI of the brain does show several new lesions including for small enhancing foci. He was switched to [facility] November or December 2016.   I have personally reviewed the MRIs of the brain and cervical spine from October 2016. The MRI of the brain shows multiple T2/FLAIR hyperintense foci consistent with MS in the hemispheres and brainstem.   4 of the hemispheric small foci enhanced after gadolinium administration. The MRI of the cervical spine shows multiple hyperintense foci. There was a normal enhancement pattern.   He had a large exacerbation while on Tecfidera with new gait difficulties March 2018.   MRI's show multiple new lesions in the brain with several lesions enhancing including a larger one in the left periventrivcular  white matter.    Also has a new focus adjacent to C4 in the spinal cord.  He was switched to ocrelizumab and had his first infusion at the end of March 2018.     REVIEW OF SYSTEMS: Constitutional: No fevers, chills, sweats, or change in appetite.   He has fatigue and sleepiness. Eyes: No visual changes, double vision, eye pain Ear, nose and throat: No hearing loss, ear pain, nasal congestion, sore throat Cardiovascular: No chest pain, palpitations Respiratory: No shortness of breath at rest or with exertion.   No wheezes GastrointestinaI: No nausea, vomiting, diarrhea, abdominal pain, fecal incontinence Genitourinary: He notes urinary frequency and ED.      Musculoskeletal: No neck pain, back pain Integumentary: No rash, pruritus, skin lesions Neurological: as above Psychiatric: See above Endocrine: No palpitations, diaphoresis, change in appetite, change in weigh or increased thirst Hematologic/Lymphatic: No anemia, purpura, petechiae. Allergic/Immunologic: No itchy/runny eyes, nasal congestion, recent allergic reactions, rashes  ALLERGIES: No Known Allergies  HOME MEDICATIONS:  Current Outpatient Medications:  .  amphetamine-dextroamphetamine (ADDERALL XR) 20 MG 24 hr capsule, Take 1 capsule (20 mg total) by mouth daily., Disp: 30 capsule, Rfl: 0 .  baclofen (LIORESAL) 10 MG tablet, Take 1 tablet (10 mg total) by mouth 4 (four) times daily as needed for muscle spasms., Disp: 120 tablet, Rfl: 11 .  benzonatate (TESSALON) 100 MG capsule, Take 1 capsule (100 mg total) by mouth every 8 (eight) hours., Disp: 21 capsule, Rfl: 0 .  ibuprofen (ADVIL,MOTRIN) 200 MG tablet, Take 400 mg by mouth every 6 (six) hours as needed for moderate pain. , Disp: , Rfl:  .  lidocaine (LMX) 4 % cream, Apply 1 application topically as needed., Disp: 30 g, Rfl: 0 .  ocrelizumab (OCREVUS) 300 MG/10ML injection, Inject 20 mLs (600 mg total) into the vein every 6 (six) months., Disp: 20 mL, Rfl: 0 .  sodium chloride (OCEAN) 0.65 % SOLN nasal spray, Place 1 spray into both nostrils as needed for congestion., Disp: 60 mL, Rfl: 0 .  triamcinolone (NASACORT) 55 MCG/ACT AERO nasal inhaler, Place 2 sprays into the nose daily., Disp: 1 Inhaler, Rfl: 12  PAST MEDICAL HISTORY: Past Medical History:  Diagnosis Date  . Depression    situaltional  . Dyspnea   . Multiple sclerosis (HCC) 12/24/13  . Multiple sclerosis (HCC)   . Vision abnormalities     PAST SURGICAL HISTORY: Past Surgical History:  Procedure Laterality Date  . NO PAST SURGERIES      FAMILY HISTORY: Family History  Problem Relation Age of Onset  . Healthy Mother   . Healthy Father   . Cancer  Maternal Grandmother        unknown   . Ataxia Sister   . Multiple sclerosis Neg Hx     SOCIAL HISTORY:  Social History   Socioeconomic History  . Marital status: Single    Spouse name: Not on file  . Number of children: Not on file  . Years of education: Not on file  . Highest education level: Not on file  Occupational History  . Not on file  Tobacco Use  . Smoking status: Former Smoker    Types: Cigarettes    Quit date: 04/18/2013    Years since quitting: 6.6  . Smokeless tobacco: Former Neurosurgeon    Quit date: 09/08/2014  Substance and Sexual Activity  . Alcohol use: Yes  . Drug use: No    Types: Marijuana    Comment: former  .  Sexual activity: Yes    Partners: Female  Other Topics Concern  . Not on file  Social History Narrative  . Not on file   Social Determinants of Health   Financial Resource Strain:   . Difficulty of Paying Living Expenses:   Food Insecurity:   . Worried About Programme researcher, broadcasting/film/video in the Last Year:   . Barista in the Last Year:   Transportation Needs:   . Freight forwarder (Medical):   Marland Kitchen Lack of Transportation (Non-Medical):   Physical Activity:   . Days of Exercise per Week:   . Minutes of Exercise per Session:   Stress:   . Feeling of Stress :   Social Connections:   . Frequency of Communication with Friends and Family:   . Frequency of Social Gatherings with Friends and Family:   . Attends Religious Services:   . Active Member of Clubs or Organizations:   . Attends Banker Meetings:   Marland Kitchen Marital Status:   Intimate Partner Violence:   . Fear of Current or Ex-Partner:   . Emotionally Abused:   Marland Kitchen Physically Abused:   . Sexually Abused:      PHYSICAL EXAM  Vitals:   12/21/19 0954  BP: 100/70  Pulse: 64  SpO2: 98%  Weight: 151 lb (68.5 kg)  Height: 5\' 6"  (1.676 m)    Body mass index is 24.37 kg/m.   General: The patient is well-developed and well-nourished and in no acute distress   Skin:  Extremities are without rash or edema.  Neurologic Exam  Mental status: The patient is alert and oriented x 3 at the time of the examination. The patient has apparent normal recent and remote memory, with a reduced attention span and concentration ability.   Speech is normal.  Cranial nerves: Extraocular movements are full.  Colors are desaturated OD relative to OS. Facial strength is normal.  Trapezius strength is normal..   No obvious hearing deficits are noted.  Motor:   Muscle bulk is normal.   He has increased muscle tone in the legs.  Strength is 5/5.  Sensory:  He reports normal and symmetric sensation to touch and vibration in the arms or legs..  Coordination: On cerebellar testing finger-nose-finger is normal.  Heel-to-shin is mildly reduced.  Gait and station: Station is normal.   His gait is mildly wide and mildly spastic.  The tandem gait is wide.  Romberg is borderline.   Reflexes: Deep tendon reflexes are symmetric and 3 in arms.  He has increased reflexes in the legs but no ankle clonus.      DIAGNOSTIC DATA (LABS, IMAGING, TESTING) - I reviewed patient records, labs, notes, testing and imaging myself where available.  Lab Results  Component Value Date   WBC 4.0 06/22/2019   HGB 14.3 06/22/2019   HCT 42.2 06/22/2019   MCV 88 06/22/2019   PLT 274 06/22/2019      Component Value Date/Time   NA 145 (H) 06/22/2019 0850   K 4.0 06/22/2019 0850   CL 108 (H) 06/22/2019 0850   CO2 21 06/22/2019 0850   GLUCOSE 85 06/22/2019 0850   GLUCOSE 117 (H) 07/16/2016 1520   BUN 9 06/22/2019 0850   CREATININE 1.10 06/22/2019 0850   CREATININE 0.92 03/27/2015 1130   CALCIUM 8.8 06/22/2019 0850   PROT 6.2 06/22/2019 0850   ALBUMIN 4.2 06/22/2019 0850   AST 14 06/22/2019 0850   ALT 22 06/22/2019 0850   ALKPHOS  82 06/22/2019 0850   BILITOT 0.2 06/22/2019 0850   GFRNONAA 89 06/22/2019 0850   GFRNONAA >89 10/10/2014 1251   GFRAA 103 06/22/2019 0850   GFRAA >89 10/10/2014 1251        ASSESSMENT AND PLAN    1. Multiple sclerosis (HCC)   2. High risk medication use   3. Gait disturbance   4. Other fatigue      1.   Continue ocrelizumab for MS. check IgG/IgM and CD19/20.  Check an MRI to determine if there is any subclinical activity.. If this is occurring we would need to consider a different DMT. 2.   Renew Adderall for attention deficit and fatigue 3.   Continue baclofen for spasticity 4.   Return to see me in 6 months or sooner if there are new or worsening symptoms.  Kyrus Hyde A. Epimenio Foot, MD, PhD 12/21/2019, 10:32 AM Certified in Neurology, Clinical Neurophysiology, Sleep Medicine, Pain Medicine and Neuroimaging  [facility] [address], [address] [place], [place] [address] [phone]

## 2019-12-24 LAB — CD19 AND CD20, FLOW CYTOMETRY

## 2019-12-24 LAB — IGG, IGA, IGM
IgA/Immunoglobulin A, Serum: 80 mg/dL — ABNORMAL LOW (ref 90–386)
IgG (Immunoglobin G), Serum: 766 mg/dL (ref 603–1613)
IgM (Immunoglobulin M), Srm: 28 mg/dL (ref 20–172)

## 2019-12-28 ENCOUNTER — Other Ambulatory Visit (HOSPITAL_COMMUNITY): Payer: Self-pay | Admitting: *Deleted

## 2019-12-28 ENCOUNTER — Other Ambulatory Visit: Payer: Self-pay

## 2019-12-28 ENCOUNTER — Ambulatory Visit
Admission: RE | Admit: 2019-12-28 | Discharge: 2019-12-28 | Disposition: A | Discharge status: Home / Self Care | Payer: Medicare Other | Source: Ambulatory Visit | Attending: Neurology | Admitting: Neurology

## 2019-12-28 ENCOUNTER — Telehealth: Payer: Self-pay | Admitting: Neurology

## 2019-12-28 DIAGNOSIS — G35 Multiple sclerosis: Secondary | ICD-10-CM

## 2019-12-28 MED ORDER — GADOBENATE DIMEGLUMINE 529 MG/ML IV SOLN
14.0000 mL | Freq: Once | INTRAVENOUS | Status: AC | PRN
  Administered 2019-12-28: 14 mL via INTRAVENOUS

## 2019-12-28 NOTE — Telephone Encounter (Signed)
Called and spoke with Laverne. Their fax number is: [phone]. Confirmed his dose is Ocrevus 600mg  IV q 6 months. She will let the pharmacy know. Awaiting MD signature on orders and then will fax.

## 2019-12-28 NOTE — Telephone Encounter (Signed)
Faxed signed orders, received fax confirmaiton

## 2019-12-28 NOTE — Telephone Encounter (Signed)
Lavern from short stay called needing orders for the pt now that [facility] no longer does the Ocrevus infusions. Please advise.

## 2019-12-29 ENCOUNTER — Ambulatory Visit (HOSPITAL_COMMUNITY)
Admission: RE | Admit: 2019-12-29 | Discharge: 2019-12-29 | Disposition: A | Discharge status: Home / Self Care | Payer: Medicare Other | Source: Ambulatory Visit | Attending: Neurology | Admitting: Neurology

## 2019-12-29 DIAGNOSIS — G35 Multiple sclerosis: Secondary | ICD-10-CM | POA: Diagnosis not present

## 2019-12-29 MED ORDER — DIPHENHYDRAMINE HCL 25 MG PO CAPS
50.0000 mg | ORAL_CAPSULE | Freq: Once | ORAL | Status: AC
  Administered 2019-12-29: 50 mg via ORAL

## 2019-12-29 MED ORDER — ACETAMINOPHEN 325 MG PO TABS
ORAL_TABLET | ORAL | Status: AC
  Filled 2019-12-29: qty 2

## 2019-12-29 MED ORDER — ACETAMINOPHEN 325 MG PO TABS
650.0000 mg | ORAL_TABLET | Freq: Once | ORAL | Status: AC
  Administered 2019-12-29: 650 mg via ORAL

## 2019-12-29 MED ORDER — METHYLPREDNISOLONE SODIUM SUCC 125 MG IJ SOLR
INTRAMUSCULAR | Status: AC
  Filled 2019-12-29: qty 2

## 2019-12-29 MED ORDER — FAMOTIDINE IN NACL 20-0.9 MG/50ML-% IV SOLN
INTRAVENOUS | Status: AC
  Filled 2019-12-29: qty 50

## 2019-12-29 MED ORDER — FAMOTIDINE IN NACL 20-0.9 MG/50ML-% IV SOLN
20.0000 mg | Freq: Once | INTRAVENOUS | Status: AC
  Administered 2019-12-29: 20 mg via INTRAVENOUS

## 2019-12-29 MED ORDER — SODIUM CHLORIDE 0.9 % IV SOLN
600.0000 mg | Freq: Once | INTRAVENOUS | Status: AC
  Administered 2019-12-29: 600 mg via INTRAVENOUS
  Filled 2019-12-29: qty 20

## 2019-12-29 MED ORDER — DIPHENHYDRAMINE HCL 25 MG PO CAPS
ORAL_CAPSULE | ORAL | Status: AC
  Filled 2019-12-29: qty 2

## 2019-12-29 MED ORDER — METHYLPREDNISOLONE SODIUM SUCC 125 MG IJ SOLR
125.0000 mg | Freq: Once | INTRAMUSCULAR | Status: AC
  Administered 2019-12-29: 125 mg via INTRAVENOUS

## 2020-01-01 ENCOUNTER — Telehealth: Payer: Self-pay | Admitting: *Deleted

## 2020-01-01 NOTE — Telephone Encounter (Signed)
-----   Message from Asa Lente, MD sent at 12/29/2019  4:40 PM EDT ----- Please let him know that the MRI of the brain showed the old MS lesions but no new lesions compared to the 2019 MRI.

## 2020-01-01 NOTE — Telephone Encounter (Signed)
Tried calling pt, VM full and could not leave message. Asked automated system to send pt SMS notification.

## 2020-01-01 NOTE — Telephone Encounter (Signed)
Took call from phone staff and spoke with pt about MRI results per Dr. Bonnita Hollow note. He verbalized understanding.

## 2020-02-14 ENCOUNTER — Other Ambulatory Visit: Payer: Self-pay | Admitting: Neurology

## 2020-02-14 MED ORDER — AMPHETAMINE-DEXTROAMPHET ER 20 MG PO CP24: 20.0000 mg | ORAL_CAPSULE | Freq: Every day | ORAL | 0 refills | Status: DC

## 2020-02-14 NOTE — Telephone Encounter (Signed)
Pt called needing a refill on his amphetamine-dextroamphetamine (ADDERALL XR) 20 MG 24 hr capsule sent in to the [facility] on [address].

## 2020-02-23 ENCOUNTER — Ambulatory Visit (HOSPITAL_COMMUNITY): Payer: Medicare Other

## 2020-03-11 ENCOUNTER — Other Ambulatory Visit: Payer: Self-pay | Admitting: Neurology

## 2020-03-11 MED ORDER — AMPHETAMINE-DEXTROAMPHET ER 20 MG PO CP24: 20.0000 mg | ORAL_CAPSULE | Freq: Every day | ORAL | 0 refills | Status: DC

## 2020-03-11 NOTE — Telephone Encounter (Signed)
Pt called needing a refill on his amphetamine-dextroamphetamine (ADDERALL XR) 20 MG 24 hr capsule sent in to the [facility] on [address].

## 2020-03-24 ENCOUNTER — Emergency Department (HOSPITAL_COMMUNITY): Payer: Medicare Other

## 2020-03-24 ENCOUNTER — Other Ambulatory Visit: Payer: Self-pay

## 2020-03-24 ENCOUNTER — Emergency Department (HOSPITAL_COMMUNITY)
Admission: EM | Admit: 2020-03-24 | Discharge: 2020-03-24 | Disposition: A | Discharge status: Home / Self Care | Payer: Medicare Other | Attending: Emergency Medicine | Admitting: Emergency Medicine

## 2020-03-24 DIAGNOSIS — Z87891 Personal history of nicotine dependence: Secondary | ICD-10-CM | POA: Insufficient documentation

## 2020-03-24 DIAGNOSIS — M25562 Pain in left knee: Secondary | ICD-10-CM | POA: Diagnosis not present

## 2020-03-24 DIAGNOSIS — W19XXXA Unspecified fall, initial encounter: Secondary | ICD-10-CM | POA: Diagnosis not present

## 2020-03-24 MED ORDER — IBUPROFEN 200 MG PO TABS
600.0000 mg | ORAL_TABLET | Freq: Once | ORAL | Status: AC
  Administered 2020-03-24: 600 mg via ORAL
  Filled 2020-03-24: qty 3

## 2020-03-24 MED ORDER — IBUPROFEN 600 MG PO TABS
600.0000 mg | ORAL_TABLET | Freq: Four times a day (QID) | ORAL | 0 refills | Status: DC | PRN
Start: 2020-03-24 — End: 2020-06-06

## 2020-03-24 NOTE — ED Provider Notes (Signed)
[facility]-EMERGENCY DEPT Provider Note   CSN: [id number] Arrival date & time: 03/24/20  1438     History Chief Complaint  Patient presents with  . Knee Pain  . Fall    [address] Reece Agar Wiler is a 34 y.o. male.  HPI      CIARAN BEGAY is a 34 y.o. male, with a history of MS, presenting to the ED with left knee pain beginning yesterday. He states he was walking when his left knee "gave out," which caused him to fall and land on the left knee against the ground. He currently only complains of pain and soreness to the left anterior knee and states it feels a little loose when he tries to ambulate on it. Denies weakness, numbness, head injury, neck/back pain, other injuries, or any other complaints.   Past Medical History:  Diagnosis Date  . Depression    situaltional  . Dyspnea   . Multiple sclerosis (HCC) 12/24/13  . Multiple sclerosis (HCC)   . Vision abnormalities     Patient Active Problem List   Diagnosis Date Noted  . High risk sexual behavior 12/22/2017  . Immunosuppression (HCC) 12/22/2017  . Screening for human immunodeficiency virus 12/22/2017  . High risk medication use 12/21/2016  . Hepatitis B antibody positive 07/19/2016  . Spasticity 06/17/2016  . Spells 07/22/2015  . Depression with anxiety 07/02/2015  . Other fatigue 05/01/2015  . Numbness 05/01/2015  . Gait disturbance 05/01/2015  . Disturbed cognition 05/01/2015  . Erectile dysfunction 05/01/2015  . Multiple sclerosis exacerbation (HCC) 03/11/2015  . Tobacco abuse 03/06/2015  . Nausea without vomiting 11/08/2014  . 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 Relapsing remitting multiple sclerosis (HCC) 10/01/2014  . Swelling of both hands 05/14/2014  . Multiple sclerosis (HCC) 12/24/2013    Past Surgical History:  Procedure Laterality Date  . NO PAST SURGERIES         Family History  Problem Relation Age of Onset  . Healthy Mother   . Healthy Father   .  Cancer Maternal Grandmother        unknown   . Ataxia Sister   . Multiple sclerosis Neg Hx     Social History   Tobacco Use  . Smoking status: Former Smoker    Types: Cigarettes    Quit date: 04/18/2013    Years since quitting: 6.9  . Smokeless tobacco: Former Neurosurgeon    Quit date: 09/08/2014  Substance Use Topics  . Alcohol use: Yes  . Drug use: No    Types: Marijuana    Comment: former    Home Medications Prior to Admission medications   Medication Sig Start Date End Date Taking? Authorizing Provider  amphetamine-dextroamphetamine (ADDERALL XR) 20 MG 24 hr capsule Take 1 capsule (20 mg total) by mouth daily. 03/11/20   Sater, Pearletha Furl, MD  baclofen (LIORESAL) 10 MG tablet Take 1 tablet (10 mg total) by mouth 4 (four) times daily as needed for muscle spasms. 12/21/19   Sater, Pearletha Furl, MD  benzonatate (TESSALON) 100 MG capsule Take 1 capsule (100 mg total) by mouth every 8 (eight) hours. 07/24/18   Georgetta Haber, NP  ibuprofen (ADVIL) 600 MG tablet Take 1 tablet (600 mg total) by mouth every 6 (six) hours as needed. 03/24/20   Takhia Spoon C, PA-C  ibuprofen (ADVIL,MOTRIN) 200 MG tablet Take 400 mg by mouth every 6 (six) hours as needed for moderate pain.     [provider]  lidocaine (LMX) 4 % cream Apply 1 application topically as needed. 06/22/19   Lomax, Amy, NP  ocrelizumab (OCREVUS) 300 MG/10ML injection Inject 20 mLs (600 mg total) into the vein every 6 (six) months. 04/18/18   Sater, Pearletha Furl, MD  sodium chloride (OCEAN) 0.65 % SOLN nasal spray Place 1 spray into both nostrils as needed for congestion. 07/24/18   Georgetta Haber, NP  triamcinolone (NASACORT) 55 MCG/ACT AERO nasal inhaler Place 2 sprays into the nose daily. 07/24/18   Georgetta Haber, NP    Allergies    Patient has no known allergies.  Review of Systems   Review of Systems  Musculoskeletal: Positive for arthralgias. Negative for back pain, joint swelling and neck pain.  Neurological: Negative for  weakness and numbness.    Physical Exam Updated Vital Signs BP 117/69 (BP Location: Right Arm)   Pulse 72   Temp 97.9 F (36.6 C) (Oral)   Resp 18   Ht 5\' 7"  (1.702 m)   Wt 63.5 kg   SpO2 99%   BMI 21.93 kg/m   Physical Exam Vitals and nursing note reviewed.  Constitutional:      General: He is not in acute distress.    Appearance: He is well-developed. He is not diaphoretic.  HENT:     Head: Normocephalic and atraumatic.  Eyes:     Conjunctiva/sclera: Conjunctivae normal.  Cardiovascular:     Rate and Rhythm: Normal rate and regular rhythm.     Pulses:          Dorsalis pedis pulses are 2+ on the right side and 2+ on the left side.       Posterior tibial pulses are 2+ on the right side and 2+ on the left side.  Pulmonary:     Effort: Pulmonary effort is normal.  Musculoskeletal:     Cervical back: Neck supple.     Comments: Extensive range of motion in the left knee, only limited at the extremes of flexion. Tenderness to the anterior left knee without swelling, deformity, color abnormality, evidence of effusion, or laxity. No tenderness, pain, or other abnormality along the rest of the left lower extremity.  Skin:    General: Skin is warm and dry.     Coloration: Skin is not pale.  Neurological:     Mental Status: He is alert.     Comments: Sensation light touch grossly intact in the left lower extremity. Strength 5/5 in the left knee and ankle. Antalgic gait, likely due to pain.  Psychiatric:        Behavior: Behavior normal.     ED Results / Procedures / Treatments   Labs (all labs ordered are listed, but only abnormal results are displayed) Labs Reviewed - No data to display  EKG None  Radiology DG Knee Complete 4 Views Left  Result Date: 03/24/2020 CLINICAL DATA:  Pt reported left anterior knee pain after a fall yesterday. Pt stated he cannot bear much weight on his left leg since the fall. EXAM: LEFT KNEE - COMPLETE 4+ VIEW COMPARISON:  None.  FINDINGS: No evidence of fracture, dislocation, or joint effusion. No evidence of arthropathy or other focal bone abnormality. Soft tissues are unremarkable. IMPRESSION: Negative. Electronically Signed   By: 13/11/2019 M.D.   On: 03/24/2020 15:38    Procedures Procedures (including critical care time)  Medications Ordered in ED Medications  ibuprofen (ADVIL) tablet 600 mg (600 mg Oral Given 03/24/20 1611)    ED  Course  I have reviewed the triage vital signs and the nursing notes.  Pertinent labs & imaging results that were available during my care of the patient were reviewed by me and considered in my medical decision making (see chart for details).    MDM Rules/Calculators/A&P                          Patient presents with left knee pain.  No evidence of vascular compromise or focal neurologic deficits.  Patient endorses laxity in the knee, however, I was unable to reproduce this on exam. I personally reviewed and interpreted the patient's x-rays. No acute osseous abnormality on x-ray. Patient placed in a knee immobilizer, given crutches, and advised to seek orthopedic follow-up. The patient was given instructions for home care as well as return precautions. Patient voices understanding of these instructions, accepts the plan, and is comfortable with discharge.   Final Clinical Impression(s) / ED Diagnoses Final diagnoses:  Fall, initial encounter  Acute pain of left knee    Rx / DC Orders ED Discharge Orders         Ordered    ibuprofen (ADVIL) 600 MG tablet  Every 6 hours PRN        03/24/20 1606           Anselm Pancoast, PA-C 03/24/20 1655    Rolan Bucco, MD 03/24/20 1853

## 2020-03-24 NOTE — Discharge Instructions (Signed)
You have been seen today for a knee injury. There were no acute abnormalities on the x-rays, including no sign of fracture or dislocation, however, there could be injuries to the soft tissues, such as the ligaments or tendons that are not seen on xrays. There could also be what are called occult fractures that are small fractures not seen on xray. Antiinflammatory medications: Take 600 mg of ibuprofen every 6 hours or 440 mg (over the counter dose) to 500 mg (prescription dose) of naproxen every 12 hours for the next 3 days. After this time, these medications may be used as needed for pain. Take these medications with food to avoid upset stomach. Choose only one of these medications, do not take them together. Acetaminophen (generic for Tylenol): Should you continue to have additional pain while taking the ibuprofen or naproxen, you may add in acetaminophen as needed. Your daily total maximum amount of acetaminophen from all sources should be limited to 4000mg/day for persons without liver problems, or 2000mg/day for those with liver problems. Ice: May apply ice to the area over the next 24 hours for 15 minutes at a time to reduce swelling. Elevation: Keep the extremity elevated as often as possible to reduce pain and inflammation. Support: Wear the knee immobilizer for support and comfort. Wear this until pain resolves. You will be weight-bearing as tolerated, which means you can slowly start to put weight on the extremity and increase amount and frequency as pain allows. Follow up: If symptoms are improving, you may follow up with your primary care provider for any continued management. If symptoms are not starting to improve within a week, you should follow up with the orthopedic specialist within two weeks. Return: Return to the ED for numbness, weakness, increasing pain, overall worsening symptoms, loss of function, or if symptoms are not improving, you have tried to follow up with the orthopedic  specialist, and have been unable to do so.  For prescription assistance, may try using prescription discount sites or apps, such as goodrx.com or Good Rx smart phone app. 

## 2020-03-24 NOTE — ED Triage Notes (Signed)
Patient reports he felt his left knee pop yesterday. He reports he cannot bear much weight on the left leg. Denies any fall.

## 2020-04-17 ENCOUNTER — Other Ambulatory Visit: Payer: Self-pay | Admitting: Neurology

## 2020-04-17 MED ORDER — AMPHETAMINE-DEXTROAMPHET ER 20 MG PO CP24: 20.0000 mg | ORAL_CAPSULE | Freq: Every day | ORAL | 0 refills | Status: DC

## 2020-04-17 NOTE — Telephone Encounter (Signed)
Pt called needing a refill on his amphetamine-dextroamphetamine (ADDERALL XR) 20 MG 24 hr capsule sent in to the [facility] on [address]

## 2020-05-22 ENCOUNTER — Other Ambulatory Visit: Payer: Self-pay | Admitting: Neurology

## 2020-05-22 MED ORDER — AMPHETAMINE-DEXTROAMPHET ER 20 MG PO CP24: 20.0000 mg | ORAL_CAPSULE | Freq: Every day | ORAL | 0 refills | Status: DC

## 2020-05-22 NOTE — Telephone Encounter (Signed)
Pt. is requesting a refill for amphetamine-dextroamphetamine (ADDERALL XR) 20 MG 24 hr capsule.  Pharmacy: [facility] [phone]

## 2020-05-22 NOTE — Addendum Note (Signed)
Addended by: Arther Abbott on: 05/22/2020 08:40 AM   Modules accepted: Orders

## 2020-06-04 ENCOUNTER — Emergency Department (HOSPITAL_COMMUNITY): Payer: Medicare Other

## 2020-06-04 ENCOUNTER — Encounter (HOSPITAL_COMMUNITY): Payer: Self-pay

## 2020-06-04 ENCOUNTER — Inpatient Hospital Stay (HOSPITAL_COMMUNITY)
Admission: EM | Admit: 2020-06-04 | Discharge: 2020-06-06 | DRG: 058 | Disposition: A | Discharge status: Home / Self Care | Payer: Medicare Other | Attending: Internal Medicine | Admitting: Internal Medicine

## 2020-06-04 DIAGNOSIS — Z87891 Personal history of nicotine dependence: Secondary | ICD-10-CM

## 2020-06-04 DIAGNOSIS — R739 Hyperglycemia, unspecified: Secondary | ICD-10-CM | POA: Diagnosis present

## 2020-06-04 DIAGNOSIS — U071 COVID-19: Secondary | ICD-10-CM | POA: Diagnosis present

## 2020-06-04 DIAGNOSIS — Z79899 Other long term (current) drug therapy: Secondary | ICD-10-CM

## 2020-06-04 DIAGNOSIS — G35 Multiple sclerosis: Principal | ICD-10-CM | POA: Diagnosis present

## 2020-06-04 DIAGNOSIS — Z5900 Homelessness unspecified: Secondary | ICD-10-CM

## 2020-06-04 DIAGNOSIS — T380X5A Adverse effect of glucocorticoids and synthetic analogues, initial encounter: Secondary | ICD-10-CM | POA: Diagnosis present

## 2020-06-04 DIAGNOSIS — F32A Depression, unspecified: Secondary | ICD-10-CM | POA: Diagnosis present

## 2020-06-04 LAB — COMPREHENSIVE METABOLIC PANEL
ALT: 24 U/L (ref 0–44)
AST: 24 U/L (ref 15–41)
Albumin: 3.9 g/dL (ref 3.5–5.0)
Alkaline Phosphatase: 70 U/L (ref 38–126)
Anion gap: 10 (ref 5–15)
BUN: 9 mg/dL (ref 6–20)
CO2: 22 mmol/L (ref 22–32)
Calcium: 8.5 mg/dL — ABNORMAL LOW (ref 8.9–10.3)
Chloride: 104 mmol/L (ref 98–111)
Creatinine, Ser: 0.98 mg/dL (ref 0.61–1.24)
GFR, Estimated: >60 mL/min (ref >60)
Glucose, Bld: 93 mg/dL (ref 70–99)
Potassium: 3.9 mmol/L (ref 3.5–5.1)
Sodium: 136 mmol/L (ref 135–145)
Total Bilirubin: 0.7 mg/dL (ref 0.3–1.2)
Total Protein: 6.2 g/dL — ABNORMAL LOW (ref 6.5–8.1)

## 2020-06-04 LAB — CBC WITH DIFFERENTIAL/PLATELET
Abs Immature Granulocytes: 0.03 10*3/uL (ref 0.00–0.07)
Basophils Absolute: 0 10*3/uL (ref 0.0–0.1)
Basophils Relative: 1 %
Eosinophils Absolute: 0.1 10*3/uL (ref 0.0–0.5)
Eosinophils Relative: 2 %
HCT: 42 % (ref 39.0–52.0)
Hemoglobin: 14.3 g/dL (ref 13.0–17.0)
Immature Granulocytes: 1 %
Lymphocytes Relative: 3 %
Lymphs Abs: 0.2 10*3/uL — ABNORMAL LOW (ref 0.7–4.0)
MCH: 30.2 pg (ref 26.0–34.0)
MCHC: 34 g/dL (ref 30.0–36.0)
MCV: 88.6 fL (ref 80.0–100.0)
Monocytes Absolute: 1 10*3/uL (ref 0.1–1.0)
Monocytes Relative: 17 %
Neutro Abs: 4.7 10*3/uL (ref 1.7–7.7)
Neutrophils Relative %: 76 %
Platelets: 326 10*3/uL (ref 150–400)
RBC: 4.74 MIL/uL (ref 4.22–5.81)
RDW: 12.9 % (ref 11.5–15.5)
WBC: 6 10*3/uL (ref 4.0–10.5)
nRBC: 0 % (ref 0.0–0.2)

## 2020-06-04 LAB — RESP PANEL BY RT-PCR (FLU A&B, COVID) ARPGX2
Influenza A by PCR: NEGATIVE
Influenza B by PCR: NEGATIVE
SARS Coronavirus 2 by RT PCR: POSITIVE — AB

## 2020-06-04 LAB — POC SARS CORONAVIRUS 2 AG -  ED: SARS Coronavirus 2 Ag: NEGATIVE

## 2020-06-04 LAB — CBG MONITORING, ED: Glucose-Capillary: 85 mg/dL (ref 70–99)

## 2020-06-04 MED ORDER — ACETAMINOPHEN 500 MG PO TABS
1000.0000 mg | ORAL_TABLET | Freq: Once | ORAL | Status: AC
  Administered 2020-06-04: 1000 mg via ORAL
  Filled 2020-06-04: qty 2

## 2020-06-04 MED ORDER — GUAIFENESIN-DM 100-10 MG/5ML PO SYRP: 10.0000 mL | ORAL_SOLUTION | ORAL | Status: DC | PRN

## 2020-06-04 MED ORDER — IBUPROFEN 800 MG PO TABS: 800.0000 mg | ORAL_TABLET | Freq: Once | ORAL | Status: DC

## 2020-06-04 MED ORDER — ENOXAPARIN SODIUM 40 MG/0.4ML ~~LOC~~ SOLN
40.0000 mg | SUBCUTANEOUS | Status: DC
  Filled 2020-06-04: qty 0.4

## 2020-06-04 MED ORDER — PROCHLORPERAZINE EDISYLATE 10 MG/2ML IJ SOLN
10.0000 mg | Freq: Once | INTRAMUSCULAR | Status: AC
  Administered 2020-06-04: 10 mg via INTRAVENOUS
  Filled 2020-06-04: qty 2

## 2020-06-04 MED ORDER — ACETAMINOPHEN 325 MG PO TABS
650.0000 mg | ORAL_TABLET | Freq: Four times a day (QID) | ORAL | Status: DC | PRN
  Administered 2020-06-04: 650 mg via ORAL
  Filled 2020-06-04: qty 2

## 2020-06-04 MED ORDER — DIPHENHYDRAMINE HCL 50 MG/ML IJ SOLN
12.5000 mg | Freq: Once | INTRAMUSCULAR | Status: AC
  Administered 2020-06-04: 12.5 mg via INTRAVENOUS
  Filled 2020-06-04: qty 1

## 2020-06-04 MED ORDER — SODIUM CHLORIDE 0.9 % IV SOLN
500.0000 mg | INTRAVENOUS | Status: DC
  Administered 2020-06-04 – 2020-06-05 (×2): 500 mg via INTRAVENOUS
  Filled 2020-06-04 (×4): qty 4

## 2020-06-04 MED ORDER — SODIUM CHLORIDE 0.9 % IV BOLUS
1000.0000 mL | Freq: Once | INTRAVENOUS | Status: AC
  Administered 2020-06-04: 1000 mL via INTRAVENOUS

## 2020-06-04 MED ORDER — GADOBUTROL 1 MMOL/ML IV SOLN
6.0000 mL | Freq: Once | INTRAVENOUS | Status: AC | PRN
  Administered 2020-06-04: 6 mL via INTRAVENOUS

## 2020-06-04 NOTE — ED Notes (Addendum)
Message sent to pharmacy regarding missing solu-medrol

## 2020-06-04 NOTE — Progress Notes (Signed)
Neurology Progress Note   S:// Pt states he doesn't feel any improvement. He states his weakness is worsening in Left Leg.  He c/o headache. He states he gets Ocrevus IV infusion for his MS. Last given in August '21. Pt is not on any oral medication for MS.   O:// Current vital signs: BP 117/79   Pulse 86   Temp (!) 102.3 F (39.1 C) (Oral)   Resp 18   SpO2 98%  Vital signs in last 24 hours: Temp:  [102.3 F (39.1 C)] 102.3 F (39.1 C) (01/18 0015) Pulse Rate:  [74-99] 86 (01/18 0730) Resp:  [15-23] 18 (01/18 0730) BP: (98-117)/(50-79) 117/79 (01/18 0730) SpO2:  [95 %-98 %] 98 % (01/18 0730) GENERAL: Awake, alert in NAD HEENT: - Normocephalic and atraumatic LUNGS - Symmetrical chest rise, No labored breathing noted CV - no JVD, No Peripheral Edema ABDOMEN - Soft,  nondistended  Ext: warm, well perfused, intact peripheral pulses, no Peripheral edema NEURO:  Mental Status: AA&Ox3 Oriented to self, age, place,and situation, Good Attention and Concentration Language: speech is fluent.  Pt is able to name simple objects, repetition intact,  fluency, and comprehension intact. Cranial Nerves: PERRL, EOMI, visual fields full, no facial asymmetry, facial sensation less on Lt side, hearing intact, tongue/uvula/soft palate midline, normal sternocleidomastoid and trapezius muscle strength. No evidence of tongue atrophy or fibrillations. Motor: No Drift in Upper Extremities, No drift in Rt leg can only lift few inches above bed. Not able to Lift Lt leg against gravity but able to move little bit.   Strength 5/5 in Rt UE, 4/5 in Lt UE, Strength in Rt LE  5/5, Lt LE 2/5 Reflexes- UE - 2+ , LE-  2+, Planters- Down going b/l Tone: is normal and bulk is normal Sensation- Less on Left leg, arm and left side of face. Symmetric on neck.  Coordination: FTN intact bilaterally, no ataxia Gait- deferred   Medications No current facility-administered medications for this encounter.  Current  Outpatient Medications:  .  amphetamine-dextroamphetamine (ADDERALL XR) 20 MG 24 hr capsule, Take 1 capsule (20 mg total) by mouth daily., Disp: 30 capsule, Rfl: 0 .  baclofen (LIORESAL) 10 MG tablet, Take 1 tablet (10 mg total) by mouth 4 (four) times daily as needed for muscle spasms., Disp: 120 tablet, Rfl: 11 .  ibuprofen (ADVIL,MOTRIN) 200 MG tablet, Take 400 mg by mouth every 6 (six) hours as needed for moderate pain. , Disp: , Rfl:  .  benzonatate (TESSALON) 100 MG capsule, Take 1 capsule (100 mg total) by mouth every 8 (eight) hours. (Patient not taking: Reported on 06/04/2020), Disp: 21 capsule, Rfl: 0 .  ibuprofen (ADVIL) 600 MG tablet, Take 1 tablet (600 mg total) by mouth every 6 (six) hours as needed. (Patient not taking: Reported on 06/04/2020), Disp: 30 tablet, Rfl: 0 .  lidocaine (LMX) 4 % cream, Apply 1 application topically as needed. (Patient not taking: Reported on 06/04/2020), Disp: 30 g, Rfl: 0 .  ocrelizumab (OCREVUS) 300 MG/10ML injection, Inject 20 mLs (600 mg total) into the vein every 6 (six) months. (Patient not taking: Reported on 06/04/2020), Disp: 20 mL, Rfl: 0 .  sodium chloride (OCEAN) 0.65 % SOLN nasal spray, Place 1 spray into both nostrils as needed for congestion. (Patient not taking: Reported on 06/04/2020), Disp: 60 mL, Rfl: 0 .  triamcinolone (NASACORT) 55 MCG/ACT AERO nasal inhaler, Place 2 sprays into the nose daily. (Patient not taking: Reported on 06/04/2020), Disp: 1 Inhaler, Rfl: 12 Labs  CBC    Component Value Date/Time   WBC 6.0 06/04/2020 0102   RBC 4.74 06/04/2020 0102   HGB 14.3 06/04/2020 0102   HGB 14.3 06/22/2019 0850   HCT 42.0 06/04/2020 0102   HCT 42.2 06/22/2019 0850   PLT 326 06/04/2020 0102   PLT 274 06/22/2019 0850   MCV 88.6 06/04/2020 0102   MCV 88 06/22/2019 0850   MCH 30.2 06/04/2020 0102   MCHC 34.0 06/04/2020 0102   RDW 12.9 06/04/2020 0102   RDW 12.5 06/22/2019 0850   LYMPHSABS 0.2 (L) 06/04/2020 0102   LYMPHSABS 2.2 06/22/2019  0850   MONOABS 1.0 06/04/2020 0102   EOSABS 0.1 06/04/2020 0102   EOSABS 0.1 06/22/2019 0850   BASOSABS 0.0 06/04/2020 0102   BASOSABS 0.0 06/22/2019 0850    CMP     Component Value Date/Time   NA 136 06/04/2020 0102   NA 145 (H) 06/22/2019 0850   K 3.9 06/04/2020 0102   CL 104 06/04/2020 0102   CO2 22 06/04/2020 0102   GLUCOSE 93 06/04/2020 0102   BUN 9 06/04/2020 0102   BUN 9 06/22/2019 0850   CREATININE 0.98 06/04/2020 0102   CREATININE 0.92 03/27/2015 1130   CALCIUM 8.5 (L) 06/04/2020 0102   PROT 6.2 (L) 06/04/2020 0102   PROT 6.2 06/22/2019 0850   ALBUMIN 3.9 06/04/2020 0102   ALBUMIN 4.2 06/22/2019 0850   AST 24 06/04/2020 0102   ALT 24 06/04/2020 0102   ALKPHOS 70 06/04/2020 0102   BILITOT 0.7 06/04/2020 0102   BILITOT 0.2 06/22/2019 0850   GFRNONAA >60 06/04/2020 0102   GFRNONAA >89 10/10/2014 1251   GFRAA 103 06/22/2019 0850   GFRAA >89 10/10/2014 1251    glycosylated hemoglobin  Lipid Panel  No results found for: CHOL, TRIG, HDL, CHOLHDL, VLDL, LDLCALC, LDLDIRECT   Imaging I have reviewed images in epic and the results pertinent to this consultation are:  MRI examination of the brain (12/28/19)  This MRI of the brain with and without contrast shows the following: 1.  Multiple T2/FLAIR hyperintense foci in the brainstem, left cerebellar hemisphere, left thalamus and in the periventricular, juxtacorticalanddeepwhite matter both hemispheres in a pattern and configuration consistent with chronic demyelinating plaque associated with multiple sclerosis.  None of the foci appear to be acute and they do not enhance.  Compared to the MRI from 01/01/2018, there are no new lesions. 2.   Normal enhancement pattern and no acute findings  MRI Cervical spine (01/01/18)   There are multiple T2 hyperintense foci within the spinal cord in a pattern and configuration consistent with chronic demyelinating plaque associated with multiple sclerosis.  None of the foci appears to  be acute.  When compared to the MRI dated 07/16/2016, there are no definite new lesions. 2.   There are no significant degenerative changes.  No nerve root compression or spinal stenosis. 3.   There is a normal enhancement pattern.  Assessment: Pt is a 35 year old with PMH of Depression, homelessness, multiple sclerosis (on Ocrevus IV infusions, last in August 2021) presented with weakness of left  lower extremity and paresthesia in all extremities which feels like his typical flare. Pt is also found to be COVID positive. This morning,  Patient complained of worsening of leg weakness and headache. On examination, Pt also has decreased sensations on Left side of body. Pt is febrile with temp of 102.3, sating well at room air. BP stable.  Last MRI brain in 12/2019 consistent with chronic  demyelinating disease.  MRI brain, cervical and thoracic spine ordered yesterday. Pt's symptoms are likely due to MS flair/exacerbation. I suspect that it worsened due to fever and COVID infection but will confirm with MRI if its an actual flair or exacerbation due to recent illness.  Impression: MS flair COVID positive Antibodies to JC virus Homelessness  Recommendations:  -F/U MRI Brain, cervical and thoracic spine -Start IV Methylprednisolone  1000 mg Daily for 3 days.    Dr. Karsten Ro MD PGY1 [facility] Neurology

## 2020-06-04 NOTE — ED Provider Notes (Signed)
6:39 AM Patient here  COVID pos. BL U/L E > LLE Hx of MS ? Flair  Feels like prior Taking meds as prescribed Followed by Dr. Epimenio Foot Awaiting MRI. Consult neuro after If N or Pos 1 g solumedrol if pos + admit   Case discussed with Dr. Amada Jupiter he states that the patient will need admission for MS flare.  Because of the medication he takes he can have negative MRI even if he has active exacerbation.  Case also discussed with the internal medicine resident service who admit the patient.  He is stable throughout his ED visit.   Arthor Captain, PA-C 06/04/20 1433    Terald Sleeper, MD 06/04/20 [phone]

## 2020-06-04 NOTE — ED Triage Notes (Signed)
Assume care from EMS, EMS reports  Weakness/ pain related to MS flare up started around 830 pm today   138/78. HR 104 O2 96% RA  RR 18

## 2020-06-04 NOTE — ED Provider Notes (Signed)
[facility] EMERGENCY DEPARTMENT Provider Note   CSN: [id number] Arrival date & time: 06/04/20  0011     History Chief Complaint  Patient presents with  . Weakness    Cornellius BENJERMIN KORBER is a 35 y.o. male with a hx of multiple sclerosis & depression who presents to the ED via EMS with complaints of weakness that began @ 20:30 this evening. Patient reports he developed generalized weakness/body aches & paresthesias to the extremities x 4 that is most prominent in the left lower extremity. Sxs spare the trunk. Also has headache, gradual onset, steady progression, generalized that is quite uncomfortable right now. Has noted some subjective fever, chills, sore throat, and mild cough at home today. Weakness/numbness feel like prior MS flares, has been compliant with his medications. Denies dyspnea, chest pain, visual disturbance, dizziness, slurred speech, vomiting, or syncope. Has not received covid 19 vaccine.   HPI     Past Medical History:  Diagnosis Date  . Depression    situaltional  . Dyspnea   . Multiple sclerosis (HCC) 12/24/13  . Multiple sclerosis (HCC)   . Vision abnormalities     Patient Active Problem List   Diagnosis Date Noted  . High risk sexual behavior 12/22/2017  . Immunosuppression (HCC) 12/22/2017  . Screening for human immunodeficiency virus 12/22/2017  . High risk medication use 12/21/2016  . Hepatitis B antibody positive 07/19/2016  . Spasticity 06/17/2016  . Spells 07/22/2015  . Depression with anxiety 07/02/2015  . Other fatigue 05/01/2015  . Numbness 05/01/2015  . Gait disturbance 05/01/2015  . Disturbed cognition 05/01/2015  . Erectile dysfunction 05/01/2015  . Multiple sclerosis exacerbation (HCC) 03/11/2015  . Tobacco abuse 03/06/2015  . Nausea without vomiting 11/08/2014  . 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 Relapsing remitting multiple sclerosis (HCC) 10/01/2014  . Swelling of both hands 05/14/2014  .  Multiple sclerosis (HCC) 12/24/2013    Past Surgical History:  Procedure Laterality Date  . NO PAST SURGERIES         Family History  Problem Relation Age of Onset  . Healthy Mother   . Healthy Father   . Cancer Maternal Grandmother        unknown   . Ataxia Sister   . Multiple sclerosis Neg Hx     Social History   Tobacco Use  . Smoking status: Former Smoker    Types: Cigarettes    Quit date: 04/18/2013    Years since quitting: 7.1  . Smokeless tobacco: Former Neurosurgeon    Quit date: 09/08/2014  Substance Use Topics  . Alcohol use: Yes  . Drug use: No    Types: Marijuana    Comment: former    Home Medications Prior to Admission medications   Medication Sig Start Date End Date Taking? Authorizing Provider  amphetamine-dextroamphetamine (ADDERALL XR) 20 MG 24 hr capsule Take 1 capsule (20 mg total) by mouth daily. 05/22/20   Sater, Pearletha Furl, MD  baclofen (LIORESAL) 10 MG tablet Take 1 tablet (10 mg total) by mouth 4 (four) times daily as needed for muscle spasms. 12/21/19   Sater, Pearletha Furl, MD  benzonatate (TESSALON) 100 MG capsule Take 1 capsule (100 mg total) by mouth every 8 (eight) hours. 07/24/18   Georgetta Haber, NP  ibuprofen (ADVIL) 600 MG tablet Take 1 tablet (600 mg total) by mouth every 6 (six) hours as needed. 03/24/20   Joy, Shawn C, PA-C  ibuprofen (ADVIL,MOTRIN) 200 MG tablet Take 400 mg  by mouth every 6 (six) hours as needed for moderate pain.     [provider]  lidocaine (LMX) 4 % cream Apply 1 application topically as needed. 06/22/19   Lomax, Amy, NP  ocrelizumab (OCREVUS) 300 MG/10ML injection Inject 20 mLs (600 mg total) into the vein every 6 (six) months. 04/18/18   Sater, Pearletha Furlichard A, MD  sodium chloride (OCEAN) 0.65 % SOLN nasal spray Place 1 spray into both nostrils as needed for congestion. 07/24/18   Georgetta HaberBurky, Natalie B, NP  triamcinolone (NASACORT) 55 MCG/ACT AERO nasal inhaler Place 2 sprays into the nose daily. 07/24/18   Georgetta HaberBurky, Natalie B, NP     Allergies    Patient has no known allergies.  Review of Systems   Review of Systems  Constitutional: Positive for chills and fever.  Eyes: Negative for visual disturbance.  Respiratory: Positive for cough. Negative for shortness of breath.   Cardiovascular: Negative for chest pain.  Gastrointestinal: Negative for blood in stool, diarrhea and vomiting.  Genitourinary: Negative for dysuria.  Musculoskeletal: Positive for arthralgias and myalgias.  Skin: Negative for rash.  Neurological: Positive for weakness, numbness and headaches. Negative for dizziness, syncope, facial asymmetry and speech difficulty.  All other systems reviewed and are negative.   Physical Exam Updated Vital Signs BP (!) 104/50 (BP Location: Right Arm)   Pulse 99   Temp (!) 102.3 F (39.1 C) (Oral)   Resp 15   SpO2 98%   Physical Exam Vitals and nursing note reviewed.  Constitutional:      General: He is not in acute distress.    Appearance: He is well-developed. He is not toxic-appearing.  HENT:     Head: Normocephalic and atraumatic.     Right Ear: Ear canal normal. Tympanic membrane is not perforated, erythematous, retracted or bulging.     Left Ear: Ear canal normal. Tympanic membrane is not perforated, erythematous, retracted or bulging.     Ears:     Comments: No mastoid erythema/swellng/tenderness.     Nose:     Right Sinus: No maxillary sinus tenderness or frontal sinus tenderness.     Left Sinus: No maxillary sinus tenderness or frontal sinus tenderness.     Mouth/Throat:     Pharynx: Oropharynx is clear. Uvula midline. No oropharyngeal exudate or posterior oropharyngeal erythema.     Comments: Posterior oropharynx is symmetric appearing. Patient tolerating own secretions without difficulty. No trismus. No drooling. No hot potato voice. No swelling beneath the tongue, submandibular compartment is soft.  Eyes:     General:        Right eye: No discharge.        Left eye: No discharge.      Conjunctiva/sclera: Conjunctivae normal.  Cardiovascular:     Rate and Rhythm: Regular rhythm. Tachycardia present.  Pulmonary:     Effort: Pulmonary effort is normal. No respiratory distress.     Breath sounds: Normal breath sounds. No wheezing, rhonchi or rales.  Abdominal:     General: There is no distension.     Palpations: Abdomen is soft.     Tenderness: There is no abdominal tenderness. There is no guarding or rebound.  Musculoskeletal:     Cervical back: Neck supple. No rigidity.     Right lower leg: No edema.     Left lower leg: No edema.  Lymphadenopathy:     Cervical: No cervical adenopathy.  Skin:    General: Skin is warm and dry.     Findings: No  rash.  Neurological:     Mental Status: He is alert.     Comments: PERRL. CN III-XII grossly intact. Patient reports decreased sensation to the extremities x 4 most notably decreased in the LLE. He has 4/5 to the bilateral upper extremities and to the RLE, LLE unable to lift off of the bed against gravity, minimal strength with plantar/dorsiflexion.   Psychiatric:        Behavior: Behavior normal.     ED Results / Procedures / Treatments   Labs (all labs ordered are listed, but only abnormal results are displayed) Labs Reviewed  COMPREHENSIVE METABOLIC PANEL - Abnormal; Notable for the following components:      Result Value   Calcium 8.5 (*)    Total Protein 6.2 (*)    All other components within normal limits  CBC WITH DIFFERENTIAL/PLATELET - Abnormal; Notable for the following components:   Lymphs Abs 0.2 (*)    All other components within normal limits  RESP PANEL BY RT-PCR (FLU A&B, COVID) ARPGX2  URINALYSIS, ROUTINE W REFLEX MICROSCOPIC  POC SARS CORONAVIRUS 2 AG -  ED    EKG [facility]  Radiology DG Chest Portable 1 View  Result Date: 06/04/2020 CLINICAL DATA:  Cough EXAM: PORTABLE CHEST 1 VIEW COMPARISON:  [facility]. FINDINGS: The heart size and mediastinal contours are within normal limits. Both lungs are clear.  The visualized skeletal structures are unremarkable. IMPRESSION: No active disease. Electronically Signed   By: Katherine Mantle M.D.   On: 06/04/2020 00:58    Procedures Procedures (including critical care time)  Medications Ordered in ED Medications  prochlorperazine (COMPAZINE) injection 10 mg (has no administration in time range)  diphenhydrAMINE (BENADRYL) injection 12.5 mg (has no administration in time range)  acetaminophen (TYLENOL) tablet 1,000 mg (has no administration in time range)    ED Course  I have reviewed the triage vital signs and the nursing notes.  Pertinent labs & imaging results that were available during my care of the patient were reviewed by me and considered in my medical decision making (see chart for details).    MDM Rules/Calculators/A&P                         Patient presents to the ED with complaints of numbness and weakness that is most prominent in the left lower extremity beginning at 8:30 PM he also has a headache as well as body aches, subjective fever, and chills.  On arrival patient is febrile to 102.3, vitals otherwise without significant abnormality.  He does have notable left lower extremity weakness more so than his generalized weakness as well as subjective decrease sensation to the extremities x4 most notable in the left lower extremity.  Plan to discuss with neurology.  00:24: CONSULT: Discussed with neurologist Dr. Thomasena Edis, no code stroke- recommends MRI brain/C spine/T spine w/wo contrast, no need for L spine MRI or CT imaging at this time. If MRI consistent w/ acute MS recommends 1 g solumedrol (hold off on this until MRI results) & admit to medicine- re-discuss w/ neurology once MRIs resulted. Appreciate recommendations.   Migraine cocktail ordered for symptomatic care as well as Tylenol for fever.  Additional history obtained:  Additional history obtained from chart review & nursing note review.  Lab Tests:  I Ordered, reviewed, and  interpreted labs, which included:  Rapid COVID testing is negative.  PCR ordered. CBC/CMP: Grossly unremarkable.  ED Course:  03:45: Discussed delay in PCR testing for COVID  w/ nursing staff- this has been obtained.  03:50: RE-EVAL: Headache improved 04:54: COVID test is positive. 05:00: Discussed w/ MRI given duration of wait for patient's imaging- patient is next, currently have another patient in process.   06:30: Patient care signed out to PA Harris @ change of shift pending MRIs & disposition.   Findings and plan of care discussed with supervising physician Dr. Pilar Plate who is in agreement.   Portions of this note were generated with Scientist, clinical (histocompatibility and immunogenetics). Dictation errors may occur despite best attempts at proofreading.  Final Clinical Impression(s) / ED Diagnoses Final diagnoses:  COVID-19    Rx / DC Orders ED Discharge Orders    [facility]       Cherly Anderson, PA-C 06/04/20 0300    Sabas Sous, MD 06/04/20 [phone]

## 2020-06-04 NOTE — ED Notes (Signed)
RN attempted report to floor x1-Monique,RN

## 2020-06-04 NOTE — ED Notes (Signed)
Patient would like a callback with an update if possible, [name], [phone]

## 2020-06-04 NOTE — ED Notes (Signed)
Pt transported to MRI 

## 2020-06-04 NOTE — H&P (Addendum)
Date: 06/04/2020               Patient Name:  Mike Lowery MRN: [id number]  DOB: May 23, 1985 Age / Sex: 35 y.o., male   PCP: Patient, No Pcp Per         Medical Service: Internal Medicine Teaching Service         Attending Physician: Dr. Mikey Bussing, Marthenia Rolling, DO    First Contact: Dr. Claudette Laws Pager: [phone]  Second Contact: Dr. Sande Brothers Pager: [phone]       After Hours (After 5p/  First Contact Pager: [phone]  weekends / holidays): Second Contact Pager: [phone]   Chief Complaint: Left side weakness and numbness, fever, bodyache  History of Present Illness:  35 y.o. male with multiple sclerosis, depression, who presented with generalized weakness, numbness of left side of his body. He states that he felt weak all over but more on his left leg and was not able to stand or walk. He mentions that this is similar to his prior MS flares. His symptoms started last night. He mentions that he follows with neurologist outpatient and reports that he gets infusion every 6 months. Beside having numbness and left side weakness, he also noticed to have some fever and chills last night and endorses mild cough and was slightly short of breath. He has some headache with intensity of 6/10. No nause or vomiting and no vision changes.  He is not vaccinated against COVID-19. Per other providers note, Mr. Cadden reported to be un-housed but, he tells me that he lives with his father in a house and denies housing issue. He mentions that his father has been asymptomatic but he one of the people who was around him told him today that he has COVID. Patient denies chest pain, abdominal pain, diarrhea.  ED course: MRI w/o evidence of stroke. Neurology was consulted in ED for possible MS flare. Patient was also found to be COVID positive with nl respiratory status and nl O2 sat. CXR unremarkable. IMTS consulted for admission of MS flare. .   Meds:  Current Meds  Medication Sig  . amphetamine-dextroamphetamine  (ADDERALL XR) 20 MG 24 hr capsule Take 1 capsule (20 mg total) by mouth daily.  . baclofen (LIORESAL) 10 MG tablet Take 1 tablet (10 mg total) by mouth 4 (four) times daily as needed for muscle spasms.  Marland Kitchen ibuprofen (ADVIL,MOTRIN) 200 MG tablet Take 400 mg by mouth every 6 (six) hours as needed for moderate pain.      Allergies: Allergies as of 06/04/2020  . (No Known Allergies)   Past Medical History:  Diagnosis Date  . Depression    situaltional  . Dyspnea   . Multiple sclerosis (HCC) 12/24/13  . Multiple sclerosis (HCC)   . Vision abnormalities     Family History:  Family History  Problem Relation Age of Onset  . Healthy Mother   . Healthy Father   . Cancer Maternal Grandmother        unknown   . Ataxia Sister   . Multiple sclerosis Neg Hx      Social History:  Reported to be un-housed in prior notes, he tells me that he lives with his father in a house though and denies housing issue.  Former smoker. He quit smoking before He reports current alcohol use every few days. He denies Hx of withdrawal. Last drink was 4 days ago. Denies illicit use drugs.   Review of Systems: A complete ROS was  negative except as per HPI.   Physical Exam: Blood pressure 109/64, pulse 88, temperature 100.1 F (37.8 C), temperature source Oral, resp. rate (!) 21, SpO2 97 %.  Constitutional: Well-developed and well-nourished. No acute distress, he is tired and reports to be weak now.  HENT:  Head: Normocephalic and atraumatic.  Eyes: Conjunctivae are normal, EOM nl Cardiovascular: RRR, nl S1S2, no murmur,  no LEE Respiratory: Effort normal and breath sounds normal. No respiratory distress. No wheezes.  GI: Soft. Bowel sounds are normal. No distension. There is no tenderness.  Neurological: Is alert and oriented x 3, decreased sensation at left side of face, left arm and left leg. Motor strength: 4/5 in LLE, 5/5 in rest of extremities.  No facial weakness or asymmetry. No muscle spasm on  exam. Skin: Not diaphoretic. No erythema.  Psychiatric: Normal mood and affect. Behavior is normal. Judgment and thought content normal.   EKG: Not obtained  CXR: personally reviewed my interpretation is no acute cardiopulmonary disease  Assessment & Plan by Problem: Active Problems:   Multiple sclerosis exacerbation (HCC)   COVID-19 virus infection  MS exacerbation: Has been symptomatic for <24 h for now. (His symptoms started at 8:30 PM last night). He report weakness and numbness of left side mostly at left leg.   On exam, he has decreased sensation of left side of face, left arm and left leg. Motor strength is slightly decreased at left arm and more at left leg Motor 4/5 at left leg.) Ocrelizumab listed at home meds, last dose Aug 2021.  -Neurology consulted in ED and saw the patient. Has been symptomatic for <24 h. No new demyelination on MRI but this can be due negative despite a flare, being on immunomodulator per neurologist. Starting steroid Tx, per neuro. Last cervical 2019 and there is no record of thoracic imaging.  -Pending Cervical and thorasic MRI w and w/o contrast per neurology -Pt received Methylprednosolone 500 mg daily for 3 days per Dr. Amada Jupiter -CBG  monitoring while on high dose steroid -PT/OT eval and Tx. -Appreciate neurology follow up  COVID-19 infection: Patient reports mild cough, fever, chills, sore throat, and mild cough for past 1 day.  He is unvaccinated against COVID. Febrile on 102.3 on arrival otherwise had nl vital sign. No shortness of breath. Saturating nl at RA with normal respiratory status. CXR unremarkable.  -Airborne precausions -No indication for Remdesivir -He is on Methylprednisolone for MS flare. Will continue -Will check inflamatory markers  Diet: Regular IV fluid: s/u 1 li NS VTE ppx: Lovenox Code status: Full Dispo: Admit patient to Inpatient with expected length of stay greater than 2 midnights.  SignedChevis Pretty, MD 06/04/2020, 3:56 PM  Pager: [phone] After 5pm on weekdays and 1pm on weekends: On Call pager: [phone]

## 2020-06-04 NOTE — Hospital Course (Addendum)
Admitted 06/04/2020  Allergies: Patient has no known allergies. Pertinent Hx: depression, homelessness, multiple sclerosis   35 y.o. male p/w left side weakness and paresthesias, fever, chills, mild cough  * MS flare: Neurology is on board. Giving IV methylprednisolone 500 mg x 3 days.  *COVID-19 infection: Fever, mild cough. Supportive care. Checking inflammatory markers. On RA.   Consults: neurology  Meds: Methylprednisolone VTE ppx: Lovenox IVF: s/p 1l NS bolus Diet: Regular   HPI: 35 y.o. male with multiple sclerosis, depression, homelessness, who presented with generalized weakness, paresthesias mostly at left leg and was not able to walk. He states that this is similar to his prior MS flares. He has had some mild headache that got worse and is now with intensity of 6/10. No nause or vomiting Patient also noted fever, chills, body aches and mild cough yesterday evening. He endorses being slightly short of breath yesterday. He is not vaccinated against COVID. Although reported to be un-housed in notes, he tells me that he lives with his father in a house and denies housing issue. He mentions that his father has been asymptomatic.  ED course: He found to be COVID positive with nl respiratory status and nl O2 sat. CXR unremarkable. Neurology was consulted in ED and patient is admitted for MS flare.  Social Hx: Reported to be un-housed in notes, he tells me that he lives with his father in a house though and denies housing issue.  Former smoker. He quit smoking before He reports current alcohol use every few days. He denies Hx of withdrawal. Last drink was 4 days ago. Denies illicit use drugs.  MS exacerbation: Has been symptomatic for <24 h. (His symptoms started at 8:30 PM last night) previously on Natalizumab and patient reports compliance but apparently, pharmacist could not find record of prescription.  -Neurology consulted in ED and saw the patient. Has been symptomatic for <24 h.  No new demyelination on MRI but this can be falsely negative per neurologist and will need steroid Tx, also given poor f/u, admitted for treatment. Last cervical 2019 and there is no record of thoracic imaging. -Pending Cervical and thorasic MRI w and w/o contrast per neurology -Pt received Methylprednosolone 500 mg daily for 3 days per neurology.  -CBG  monitoring while on high dose steroid  COVID-19 infection: Patient reports mild cough, fever, chills, sore throat, and mild cough for past 1 day.  He is unvaccinated against COVID. Febrile on 102.3 on arrival otherwise had nl vital sign. No shortness of breath. Saturating nl at RA with normal respiratory status. CXR unremarkable.  -Airborne precausions -No indication for Remdesivir -He is on Methylprednisolone for MS flare. Will continue -Will check inflamatory markers

## 2020-06-04 NOTE — Consult Note (Addendum)
Neurology Consult H&P  CC: MS Flare  History is obtained from: Patient, ED staff, chart  HPI: Mike Lowery is a 35 y.o. male  PMHx depression, homelessness, multiple sclerosis (may be on natalizumab) developed generalized weakness, body aches, paresthesias in all extremities however most prominently in the left lower extremity and sparing his trunk. He states the weakness and paresthesias feel like his typical flares. He has associated holocephalic headache with gradual onset which progressed ~7/10.    Reports being  compliant with his medications.  Reports chills, subjective fever, sore throat, with a mild cough which he noticed yesterday.  Denies dyspnea, chest pain, changes in vision/hearing, dizziness, slurred speech, vomiting, or syncope.   No covid 19 vaccine.     ROS: A complete ROS was performed and is negative except as noted in the HPI.   Past Medical History:  Diagnosis Date  . Depression    situaltional  . Dyspnea   . Multiple sclerosis (HCC) 12/24/13  . Multiple sclerosis (HCC)   . Vision abnormalities      Family History  Problem Relation Age of Onset  . Healthy Mother   . Healthy Father   . Cancer Maternal Grandmother        unknown   . Ataxia Sister   . Multiple sclerosis Neg Hx     Social History:  reports that he quit smoking about 7 years ago. His smoking use included cigarettes. He quit smokeless tobacco use about 5 years ago. He reports current alcohol use. He reports that he does not use drugs.   Prior to Admission medications   Medication Sig Start Date End Date Taking? Authorizing Provider  amphetamine-dextroamphetamine (ADDERALL XR) 20 MG 24 hr capsule Take 1 capsule (20 mg total) by mouth daily. 05/22/20   Sater, Pearletha Furl, MD  baclofen (LIORESAL) 10 MG tablet Take 1 tablet (10 mg total) by mouth 4 (four) times daily as needed for muscle spasms. 12/21/19   Sater, Pearletha Furl, MD  benzonatate (TESSALON) 100 MG capsule Take 1 capsule (100 mg total) by  mouth every 8 (eight) hours. 07/24/18   Georgetta Haber, NP  ibuprofen (ADVIL) 600 MG tablet Take 1 tablet (600 mg total) by mouth every 6 (six) hours as needed. 03/24/20   Joy, Shawn C, PA-C  ibuprofen (ADVIL,MOTRIN) 200 MG tablet Take 400 mg by mouth every 6 (six) hours as needed for moderate pain.     [provider]  lidocaine (LMX) 4 % cream Apply 1 application topically as needed. 06/22/19   Lomax, Amy, NP  ocrelizumab (OCREVUS) 300 MG/10ML injection Inject 20 mLs (600 mg total) into the vein every 6 (six) months. 04/18/18   Sater, Pearletha Furl, MD  sodium chloride (OCEAN) 0.65 % SOLN nasal spray Place 1 spray into both nostrils as needed for congestion. 07/24/18   Georgetta Haber, NP  triamcinolone (NASACORT) 55 MCG/ACT AERO nasal inhaler Place 2 sprays into the nose daily. 07/24/18   Georgetta Haber, NP    Exam: Current vital signs: BP (!) 110/58   Pulse 99   Temp (!) 102.3 F (39.1 C) (Oral)   Resp (!) 22   SpO2 97%   Physical Exam  Constitutional: Appears well-developed and well-nourished.  Psych: Affect appropriate to situation Eyes: No scleral injection HENT: No OP obstrucion Head: Normocephalic.  Cardiovascular: Normal rate and regular rhythm.  Respiratory: Effort normal, symmetric excursions bilaterally, no audible wheezing. GI: Soft.  No distension. There is no tenderness.  Skin: WDI  Neuro: Mental Status: Patient is awake, alert, oriented to person, place, month, year, and situation. Patient is able to give a clear and coherent history. No signs of aphasia or neglect. Cranial Nerves: II: Visual Fields are full. Pupils are equal, round, and reactive to light. III,IV, VI: EOMI without ptosis or diploplia.  V: Facial sensation is symmetric to temperature VII: Facial movement is symmetric.  VIII: hearing is intact to voice X: Uvula elevates symmetrically XI: Shoulder shrug is symmetric. XII: tongue is midline without atrophy or fasciculations.  Motor: Tone is  normal. Bulk is normal. 5/5 strength was present in all four extremities. Sensory: Sensation is symmetric to light touch and temperature in the arms and legs. Deep Tendon Reflexes: 2+ and symmetric in the biceps and patellae. Plantars: Toes are downgoing bilaterally. Cerebellar: FNF and HKS are intact bilaterally.  I have reviewed labs in epic and the pertinent results are: JC Ab (+) - 2016  I have reviewed the images obtained: MRI brain showed few scattered foci of non-enhancing increased T2/FLAIR signal periventricular, juxtacortical and deep white matter both hemispheres, left thalamus and brainstem (pons, cerebral peduncle...)   Assessment: Mike Lowery is a 35 y.o. male PMHx depression, homelessness, multiple sclerosis (previously on natalizumab states he is taking however pharmacist could not find record of prescription) with paresthesias in all extremities mostly left lower extremity which he states feels like his typical flares.   The patient is homeless and may not have follow up and therefore would warrant admission and possible treatment.  Impression:  Multiple sclerosis exacerbation - Thus far, symptoms <24 hours and no new demyelinating lesions on MRI. Antibodies to JC virus. COVID (+) Homelessness.  Plan: - MRI wo/w contrast cervical and thoracic spines - Last cervical 2019 and there is no record of thoracic imaging. Addendum: - The patient received methylprednosolone 1,000mg  in emergency room and therefore continue treatment methylprednosolone 1,000mg  daily for 4 additional days.  Electronically signed by:  Marisue Humble, MD Page: [phone] 06/04/2020, 2:25 AM

## 2020-06-04 NOTE — ED Notes (Signed)
Dinner tray at bedside

## 2020-06-04 NOTE — ED Notes (Signed)
Please call mother Lucie Leather @ [phone]-- for a status update

## 2020-06-05 DIAGNOSIS — G35 Multiple sclerosis: Principal | ICD-10-CM

## 2020-06-05 LAB — HEMOGLOBIN A1C
Hgb A1c MFr Bld: 4.9 % (ref 4.8–5.6)
Mean Plasma Glucose: 93.93 mg/dL

## 2020-06-05 LAB — COMPREHENSIVE METABOLIC PANEL
ALT: 38 U/L (ref 0–44)
AST: 29 U/L (ref 15–41)
Albumin: 4 g/dL (ref 3.5–5.0)
Alkaline Phosphatase: 69 U/L (ref 38–126)
Anion gap: 10 (ref 5–15)
BUN: 8 mg/dL (ref 6–20)
CO2: 24 mmol/L (ref 22–32)
Calcium: 9.1 mg/dL (ref 8.9–10.3)
Chloride: 103 mmol/L (ref 98–111)
Creatinine, Ser: 0.94 mg/dL (ref 0.61–1.24)
GFR, Estimated: >60 mL/min (ref >60)
Glucose, Bld: 138 mg/dL — ABNORMAL HIGH (ref 70–99)
Potassium: 4.4 mmol/L (ref 3.5–5.1)
Sodium: 137 mmol/L (ref 135–145)
Total Bilirubin: 0.2 mg/dL — ABNORMAL LOW (ref 0.3–1.2)
Total Protein: 6.6 g/dL (ref 6.5–8.1)

## 2020-06-05 LAB — CBC WITH DIFFERENTIAL/PLATELET
Abs Immature Granulocytes: 0.02 10*3/uL (ref 0.00–0.07)
Basophils Absolute: 0 10*3/uL (ref 0.0–0.1)
Basophils Relative: 0 %
Eosinophils Absolute: 0 10*3/uL (ref 0.0–0.5)
Eosinophils Relative: 0 %
HCT: 43.9 % (ref 39.0–52.0)
Hemoglobin: 15 g/dL (ref 13.0–17.0)
Immature Granulocytes: 1 %
Lymphocytes Relative: 10 %
Lymphs Abs: 0.4 10*3/uL — ABNORMAL LOW (ref 0.7–4.0)
MCH: 30.2 pg (ref 26.0–34.0)
MCHC: 34.2 g/dL (ref 30.0–36.0)
MCV: 88.5 fL (ref 80.0–100.0)
Monocytes Absolute: 0.1 10*3/uL (ref 0.1–1.0)
Monocytes Relative: 2 %
Neutro Abs: 3.2 10*3/uL (ref 1.7–7.7)
Neutrophils Relative %: 87 %
Platelets: 284 10*3/uL (ref 150–400)
RBC: 4.96 MIL/uL (ref 4.22–5.81)
RDW: 13 % (ref 11.5–15.5)
WBC: 3.6 10*3/uL — ABNORMAL LOW (ref 4.0–10.5)
nRBC: 0 % (ref 0.0–0.2)

## 2020-06-05 LAB — GLUCOSE, CAPILLARY
Glucose-Capillary: 158 mg/dL — ABNORMAL HIGH (ref 70–99)
Glucose-Capillary: 139 mg/dL — ABNORMAL HIGH (ref 70–99)
Glucose-Capillary: 92 mg/dL (ref 70–99)
Glucose-Capillary: 154 mg/dL — ABNORMAL HIGH (ref 70–99)
Glucose-Capillary: 123 mg/dL — ABNORMAL HIGH (ref 70–99)

## 2020-06-05 LAB — HIV ANTIBODY (ROUTINE TESTING W REFLEX): HIV Screen 4th Generation wRfx: NONREACTIVE

## 2020-06-05 LAB — D-DIMER, QUANTITATIVE: D-Dimer, Quant: 0.29 ug/mL-FEU (ref 0.00–0.50)

## 2020-06-05 LAB — FERRITIN: Ferritin: 123 ng/mL (ref 24–336)

## 2020-06-05 LAB — C-REACTIVE PROTEIN: CRP: <0.5 mg/dL (ref <1.0)

## 2020-06-05 MED ORDER — INSULIN ASPART 100 UNIT/ML ~~LOC~~ SOLN
0.0000 [IU] | Freq: Three times a day (TID) | SUBCUTANEOUS | Status: DC
  Administered 2020-06-05: 2 [IU] via SUBCUTANEOUS

## 2020-06-05 NOTE — Plan of Care (Signed)
  Problem: Education: Goal: Knowledge of risk factors and measures for prevention of condition will improve Outcome: Progressing   Problem: Coping: Goal: Psychosocial and spiritual needs will be supported Outcome: Progressing   Problem: Respiratory: Goal: Will maintain a patent airway Outcome: Progressing Goal: Complications related to the disease process, condition or treatment will be avoided or minimized Outcome: Progressing   

## 2020-06-05 NOTE — Evaluation (Signed)
Physical Therapy Evaluation Patient Details Name: Mike Lowery MRN: [id number] DOB: 30-Oct-1985 Today's Date: 06/05/2020   History of Present Illness  Pt is a 35 year old with PMH of Depression and MS who presented with weakness of left  lower extremity and paresthesia in all extremities which feels like his typical flare. Pt found to be COVID positive. MRI Brain, Cervical spine and thoracic spine revealed no enhancing lesions. Pt admitted with MS exacerbation, incidental COVID +  Clinical Impression  Pt admitted with above diagnosis. Pt presenting with mild deficits in mobility, L LE strength, and L LE sensation.  Reports this is similar to prior MS flairs.  He remains safe to continue ambulation in room independently but will benefit from PT to address gait abnormalities, endurance, and L LE weakness.  Provided with theraband - pt familiar with theraband use.  Pt with no respiratory symptoms and all VSS on RA.  No therapy needs at discharge. Pt currently with functional limitations due to the deficits listed below (see PT Problem List). Pt will benefit from skilled PT to increase their independence and safety with mobility to allow discharge to the venue listed below.       Follow Up Recommendations No PT follow up    Equipment Recommendations  None recommended by PT    Recommendations for Other Services       Precautions / Restrictions Precautions Precautions: None      Mobility  Bed Mobility Overal bed mobility: Independent             General bed mobility comments: Pt has been ambulating in room independently    Transfers Overall transfer level: Needs assistance   Transfers: Sit to/from Stand Sit to Stand: Supervision         General transfer comment: had supervision but has been unplugging telemetry and ambulating to bathroom independently  Ambulation/Gait Ambulation/Gait assistance: Supervision Gait Distance (Feet): 300 Feet Assistive device: None Gait  Pattern/deviations: Step-through pattern;Decreased stance time - left;Decreased weight shift to left;Decreased dorsiflexion - left Gait velocity: decreased   General Gait Details: Pt able to ambulate safely but does demonstrate decreased strength in L side with above gait deficits and also L hip circumduction.  Stairs            Wheelchair Mobility    Modified Rankin (Stroke Patients Only)       Balance Overall balance assessment: Independent                                           Pertinent Vitals/Pain Pain Assessment: No/denies pain    Home Living Family/patient expects to be discharged to:: Private residence Living Arrangements: Parent Available Help at Discharge: Family;Available PRN/intermittently Type of Home: House Home Access: Stairs to enter Entrance Stairs-Rails: Right;Left;Can reach both Entrance Stairs-Number of Steps: 7 Home Layout: One level Home Equipment: None      Prior Function Level of Independence: Independent         Comments: Ambulatory and working out/exercising at home     Hand Dominance        Extremity/Trunk Assessment   Upper Extremity Assessment Upper Extremity Assessment: Defer to OT evaluation    Lower Extremity Assessment Lower Extremity Assessment: LLE deficits/detail;RLE deficits/detail RLE Deficits / Details: ROM WFL; MMT 5/5 LLE Deficits / Details: ROM WFL; MMT 4/5 throughout LLE Sensation: decreased light touch (throughout extremity)  Cervical / Trunk Assessment Cervical / Trunk Assessment: Normal  Communication   Communication: No difficulties  Cognition Arousal/Alertness: Awake/alert Behavior During Therapy: WFL for tasks assessed/performed Overall Cognitive Status: Within Functional Limits for tasks assessed                                        General Comments General comments (skin integrity, edema, etc.): Pt on RA with all VSS.  Provided with theraband for  strengthening (pt familiar with use of theraband).  Pt agreeable to PT while hospitalized to address weaknesses, but also discussed continued ambulation independently.    Exercises     Assessment/Plan    PT Assessment Patient needs continued PT services  PT Problem List Decreased strength;Decreased mobility;Decreased activity tolerance       PT Treatment Interventions Therapeutic activities;Gait training;Therapeutic exercise;Patient/family education;Stair training;Balance training;Functional mobility training    PT Goals (Current goals can be found in the Care Plan section)  Acute Rehab PT Goals Patient Stated Goal: return home PT Goal Formulation: With patient Time For Goal Achievement: 06/19/20 Potential to Achieve Goals: Good    Frequency Min 3X/week   Barriers to discharge        Co-evaluation               AM-PAC PT "6 Clicks" Mobility  Outcome Measure Help needed turning from your back to your side while in a flat bed without using bedrails?: None Help needed moving from lying on your back to sitting on the side of a flat bed without using bedrails?: None Help needed moving to and from a bed to a chair (including a wheelchair)?: None Help needed standing up from a chair using your arms (e.g., wheelchair or bedside chair)?: None Help needed to walk in hospital room?: None Help needed climbing 3-5 steps with a railing? : A Little 6 Click Score: 23    End of Session   Activity Tolerance: Patient tolerated treatment well Patient left: in bed;with call bell/phone within reach Nurse Communication: Mobility status PT Visit Diagnosis: Other abnormalities of gait and mobility (R26.89);Muscle weakness (generalized) (M62.81)    Time: 5625-6389 PT Time Calculation (min) (ACUTE ONLY): 15 min   Charges:   PT Evaluation $PT Eval Low Complexity: 1 Low          Aleasha Fregeau, PT Acute Rehab Services Pager [phone] [facility] [phone]    Rayetta Humphrey 06/05/2020, 1:30 PM

## 2020-06-05 NOTE — Progress Notes (Signed)
HD#1 Subjective:   No acute events overnight.  During evaluation at bedside this morning, the patient reports he is feeling stronger on his L side compared to yesterday. States that at his baseline he is slightly weaker on his left side, but still not quite at his baseline. When asked about his vaccine status, he states he is not vaccinated, does not provide reason when asked.  Objective:   Vital signs in last 24 hours: Vitals:   06/04/20 2024 06/04/20 2232 06/04/20 2343 06/05/20 0411  BP: 112/68 (!) 97/57 109/64 116/66  Pulse: 92 78 72 84  Resp: 20 15 18 19   Temp: 99.9 F (37.7 C) 99.4 F (37.4 C) 99.9 F (37.7 C) 98.4 F (36.9 C)  TempSrc: Oral Oral Axillary Oral  SpO2: 97% 97% 96% 97%   Supplemental O2: None  Physical Exam Constitutional: well-appearing man sitting on edge of bed, in no acute distress Cardiovascular: regular rate and rhythm, no m/r/g Pulmonary/Chest: normal work of breathing on room air, lungs clear to auscultation bilaterally MSK: normal bulk and tone Neurological: alert & oriented x 3, 5/5 strength in RUE, RLE. 4+/5 strength with L tricep, 5/5 strength with R bicep, 5/5 bilateral grip strength; 4/5 strength with L hip flexion, 5/5 strength with L hip extension. Repetitive spasms during extension at the bilateral knees. Mildly decreased sensation in the LUE, trunk, and LLE  Pertinent Labs: CBC Latest Ref Rng & Units 06/05/2020 06/04/2020 06/22/2019  WBC 4.0 - 10.5 K/uL 3.6(L) 6.0 4.0  Hemoglobin 13.0 - 17.0 g/dL 08/20/2019 87.6 81.1  Hematocrit 39.0 - 52.0 % 43.9 42.0 42.2  Platelets 150 - 400 K/uL 284 326 274    CMP Latest Ref Rng & Units 06/05/2020 06/04/2020 06/22/2019  Glucose 70 - 99 mg/dL 08/20/2019) 93 85  BUN 6 - 20 mg/dL 8 9 9   Creatinine 0.61 - 1.24 mg/dL 620(B 5.59  Sodium 135 - 145 mmol/L 137 136 145(H)  Potassium 3.5 - 5.1 mmol/L 4.4 3.9 4.0  Chloride 98 - 111 mmol/L 103 104 108(H)  CO2 22 - 32 mmol/L 24 22 21   Calcium 8.9 - 10.3 mg/dL 9.1  7.41) 8.8  Total Protein 6.5 - 8.1 g/dL 6.6 6.2(L) 6.2  Total Bilirubin 0.3 - 1.2 mg/dL 6.38) 0.7 0.2  Alkaline Phos 38 - 126 U/L 69 70 82  AST 15 - 41 U/L 29 24 14   ALT 0 - 44 U/L 38 24 22    Imaging:  06/04/20 MR THORACIC SPINE W WO CONTRAST  CLINICAL DATA:  Multiple sclerosis, new event. Weakness. Paresthesias. EXAM: MRI HEAD WITHOUT AND WITH CONTRAST MRI CERVICAL AND THORACIC SPINE WITHOUT AND WITH CONTRAST TECHNIQUE: Multiplanar, multiecho pulse sequences of the brain and surrounding structures were obtained without and with intravenous contrast. Multiplanar and multiecho pulse sequences of the cervical spine, to include the craniocervical junction and cervicothoracic junction, and the thoracic spine, were obtained without and with intravenous contrast. CONTRAST:  54mL GADAVIST GADOBUTROL 1 MMOL/ML IV SOLN COMPARISON:  MRI head and cervical spine 12/28/2019. No prior MRI of the thoracic spine. FINDINGS: MRI HEAD FINDINGS Brain: There are numerous periventricular, juxta cortical and subcortical T2/FLAIR hyperintensities within the supratentorial brain, which are not substantially changed relative to the prior. There also similar T2/FLAIR hyperintensities involving the left superior and bilateral middle cerebellar peduncles. No new lesions identified. No enhancing lesions or other abnormal enhancement. No hydrocephalus. No acute infarct. No extra-axial fluid collection. Vascular: Major arterial flow voids are maintained at the skull base.  Skull and upper cervical spine: Sinuses/orbits: Mucosal thickening of bilateral inferior maxillary sinuses. Other: No mastoid effusions. MRI CERVICAL SPINE FINDINGS Alignment: Straightening of the normal cervical lordosis without substantial sagittal subluxation. Vertebrae: Vertebral body heights are maintained. No evidence of acute fracture, discitis/osteomyelitis, or suspicious bone lesion. Cord: There are numerous T2/STIR hyperintense cord lesions in the cervical  spine. This includes a dorsal short-segment lesion at C2, predominantly left a centric lesion centered at C2-C3, dorsal lesion centered at C3, dorsal and right eccentric lesions spanning from C4 to C6, and left eccentric lesion at the cervicothoracic junction. Although the extent of cord signal abnormality limits evaluation, no definite new lesion identified. Postcontrast imaging is degraded by motion without evidence of enhancing cord lesion. Vertebral arteries, paraspinal tissues: Visualized vertebral flow voids are maintained. Disc levels: No significant canal stenosis. Mild multilevel uncovertebral hypertrophy with mild multilevel foraminal stenosis. MRI THORACIC SPINE FINDINGS Alignment:  Normal Vertebrae: Vertebral body heights are maintained. No evidence of acute fracture, discitis/osteomyelitis, or suspicious bone lesion. Cord: Multifocal short-segment cord signal abnormality throughout the thoracic cord. This includes a left eccentric lesion at the cervicothoracic junction, predominantly central/dorsal lesions centered at T2/T3, T4/T5, and T5/T6, dorsal and left eccentric lesion at T7-T8, nearly holo cord lesion centered at T9, and left dorsal lesion at T10-T11. No substantial cord expansion. No evidence of enhancing lesion. Paraspinal and other soft tissues: Partially imaged T2 hyperintense renal lesions, incompletely characterized but most likely cysts. Disc levels: No substantial focal degenerative change. No significant canal or foraminal stenosis. IMPRESSION: MRI head: 1. Similar infratentorial and supratentorial T2/FLAIR hyperintense white matter lesions, compatible with the sequela of the patient's reported multiple sclerosis. 2. No enhancing lesions to suggest active demyelination. 3. No acute abnormality. MRI cervical spine: 1. Similar extensive cord signal abnormality throughout the cervical spine without definite new cord lesion, compatible with prior demyelination. 2. No abnormal cord  enhancement to suggest active demyelination. MRI thoracic spine: 1. Extensive cord signal abnormality throughout the thoracic spine, compatible with demyelination. No priors available for comparison. 2. No abnormal cord enhancement to suggest active demyelination. Electronically Signed   By: Feliberto Harts MD   On: 06/04/2020 11:23    Assessment/Plan:   Active Problems:   Multiple sclerosis exacerbation (HCC)   COVID-19 virus infection   Patient Summary: Mike Lowery is a 35 y.o. man with a past medical history of multiple sclerosis (on Ocrevus IV infusions, last in August 2021) and depression who presented with left-sided weakness and numbness and was admitted for suspected MS flair/exacerbation in the setting of COVID-19 infection.  This is hospital day 1.   Multiple sclerosis exacerbation in setting of COVID-19 infection Patient with improvement in L-sided weakness and sensory deficit after initiation of IV steroid therapy yesterday. MRI brain, cervical spine, and thoracic spine without enhancing lesions, however per neurology, reasonable to treat patient with short course of steroids since flares can present without imaging findings on certain immunomodulating therapies. - Neurology consulted, appreciate recs - IV methylprednisolone 500 mg daily day 2 of 3 (Day 1: 06/04/20) - PT/OT evaluation - CBG monitoring with SSI as below  COVID-19 infection Patient reports persistent mild cough, no shortness of breath. Afebrile overnight. VSS, maintaining O2 sat on room air. Inflammatory markers not elevated (CRP <0.5, D-dimer 0.29, ferritin 123). - Airborne precausions - No indication for Remdesivir - Methylprednisolone for MS flare as above - Will check inflamatory markers - Today is day 3 of symptoms. Patient will need to isolate for at least  5 days total.  Steroid-induced hyperglycemia Hgb A1c 4.9%. AM CBGs 130s.  - SSI sensitive while on high-dose steroids  Diet: Normal IVF:  None VTE: Enoxaparin Code: Full PT/OT recs: Pending TOC recs: Patient lives at home with his parents. He does not have PCP, so CM will arrange for appointment at the [facility] clinic at discharge.   Dispo: Anticipated discharge to Home in 1 days pending completion of IV steroid treatment.    Please contact the on call pager after 5 pm and on weekends at [phone].  Alphonzo Severance, MD PGY-1 Internal Medicine Teaching Service Pager: [phone] 06/05/2020

## 2020-06-05 NOTE — Progress Notes (Signed)
Neurology Progress Note   S:// Pt states his weakness has improved a lot. He denies any headache today.    O://  Pt is making improvement. Left leg weakness improved significantly. Current vital signs: BP 109/66 (BP Location: Right Arm)   Pulse 71   Temp 98.8 F (37.1 C) (Oral)   Resp 18   SpO2 97%  Vital signs in last 24 hours: Temp:  [98.4 F (36.9 C)-102 F (38.9 C)] 98.8 F (37.1 C) (01/19 0814) Pulse Rate:  [71-92] 71 (01/19 0814) Resp:  [13-21] 18 (01/19 0814) BP: (97-125)/(54-82) 109/66 (01/19 0814) SpO2:  [95 %-98 %] 97 % (01/19 0814) GENERAL: Awake, alert in NAD HEENT: - Normocephalic and atraumatic LUNGS - Symmetrical chest rise, No labored breathing noted CV - no JVD, No Peripheral Edema ABDOMEN - Soft,  nondistended  Ext: warm, well perfused, intact peripheral pulses, no Peripheral edema NEURO:  Mental Status: AA&Ox4, Oriented to self, age, place, date, month , year and situation,  Good Attention and Concentration Language: speech is fluent.  Pt is able to name simple objects, repetition intact,  fluency, and comprehension intact. Cranial Nerves: PERRL, EOMI, visual fields full, no facial asymmetry, facial sensation intact, hearing intact, tongue/uvula/soft palate midline, normal sternocleidomastoid and trapezius muscle strength. No evidence of tongue atrophy or fibrillations Motor: No Drift in Upper Extremities, No Drift in LE's. Strength 5/5 in Rt UE, 5/5 in Lt UE, Strength in Rt LE  5/5, Lt LE 4/5.  Reflexes- UE -2+  , LE-  2+, Planters- Down going  Tone: is normal and bulk is normal Sensation- Decreased on Left LE and Left UE. Coordination: FTN intact bilaterally, no ataxia in BLE Gait- deferred   Medications  Current Facility-Administered Medications:  .  acetaminophen (TYLENOL) tablet 650 mg, 650 mg, Oral, Q6H PRN, Masoudi, Elhamalsadat, MD, 650 mg at 06/04/20 1702 .  enoxaparin (LOVENOX) injection 40 mg, 40 mg, Subcutaneous, Q24H, Masoudi,  Elhamalsadat, MD .  guaiFENesin-dextromethorphan (ROBITUSSIN DM) 100-10 MG/5ML syrup 10 mL, 10 mL, Oral, Q4H PRN, Masoudi, Elhamalsadat, MD .  insulin aspart (novoLOG) injection 0-9 Units, 0-9 Units, Subcutaneous, TID WC, Alphonzo Severance, MD .  methylPREDNISolone sodium succinate (SOLU-MEDROL) 500 mg in sodium chloride 0.9 % 50 mL IVPB, 500 mg, Intravenous, Q24H, Masoudi, Elhamalsadat, MD, Stopped at 06/04/20 2332 Labs CBC    Component Value Date/Time   WBC 3.6 (L) 06/05/2020 0809   RBC 4.96 06/05/2020 0809   HGB 15.0 06/05/2020 0809   HGB 14.3 06/22/2019 0850   HCT 43.9 06/05/2020 0809   HCT 42.2 06/22/2019 0850   PLT 284 06/05/2020 0809   PLT 274 06/22/2019 0850   MCV 88.5 06/05/2020 0809   MCV 88 06/22/2019 0850   MCH 30.2 06/05/2020 0809   MCHC 34.2 06/05/2020 0809   RDW 13.0 06/05/2020 0809   RDW 12.5 06/22/2019 0850   LYMPHSABS 0.4 (L) 06/05/2020 0809   LYMPHSABS 2.2 06/22/2019 0850   MONOABS 0.1 06/05/2020 0809   EOSABS 0.0 06/05/2020 0809   EOSABS 0.1 06/22/2019 0850   BASOSABS 0.0 06/05/2020 0809   BASOSABS 0.0 06/22/2019 0850    CMP     Component Value Date/Time   NA 137 06/05/2020 0809   NA 145 (H) 06/22/2019 0850   K 4.4 06/05/2020 0809   CL 103 06/05/2020 0809   CO2 24 06/05/2020 0809   GLUCOSE 138 (H) 06/05/2020 0809   BUN 8 06/05/2020 0809   BUN 9 06/22/2019 0850   CREATININE 0.94 06/05/2020 0809   CREATININE  0.92 03/27/2015 1130   CALCIUM 9.1 06/05/2020 0809   PROT 6.6 06/05/2020 0809   PROT 6.2 06/22/2019 0850   ALBUMIN 4.0 06/05/2020 0809   ALBUMIN 4.2 06/22/2019 0850   AST 29 06/05/2020 0809   ALT 38 06/05/2020 0809   ALKPHOS 69 06/05/2020 0809   BILITOT 0.2 (L) 06/05/2020 0809   BILITOT 0.2 06/22/2019 0850   GFRNONAA >60 06/05/2020 0809   GFRNONAA >89 10/10/2014 1251   GFRAA 103 06/22/2019 0850   GFRAA >89 10/10/2014 1251    glycosylated hemoglobin  Lipid Panel  No results found for: CHOL, TRIG, HDL, CHOLHDL, VLDL, LDLCALC,  LDLDIRECT   Imaging I have reviewed images in epic and the results pertinent to this consultation are:  MRI examination of the brain  1. Similar infratentorial and supratentorial T2/FLAIR hyperintense white matter lesions, compatible with the sequela of the patient's reported multiple sclerosis. 2. No enhancing lesions to suggest active demyelination. 3. No acute abnormality.  MRI cervical spine:  1. Similar extensive cord signal abnormality throughout the cervical spine without definite new cord lesion, compatible with prior demyelination. 2. No abnormal cord enhancement to suggest active demyelination.  MRI thoracic spine:  1. Extensive cord signal abnormality throughout the thoracic spine, compatible with demyelination. No priors available for comparison. 2. No abnormal cord enhancement to suggest active demyelination.  Assessment: Pt is a 35 year old with PMH of Depression, homelessness, multiple sclerosis (on Ocrevus IV infusions, last in August 2021) presented with weakness of left  lower extremity and paresthesia in all extremities which feels like his typical flare. COVID positive. MRI Brain, Cervical spine and thoracic spine revealed no enhancing lesions. Today on examination,  Pt's  weakness has improved significantly with mild residual weakness in Left leg and decreased sensation on Left LE and Lt UE. Afebrile today. Pt was started on Methyl prednisone 500 mg yesterday for a total of 3 days. Pt's symptoms likely due to MS exacerbation due to COVID infection. As the Pt is making improvement, We'll continue same treatment.   Impression: MS Exacerbation COVID infection  A/b's to JC virus Homelessness  Recommendations: -Continue Methylprednisolone 500 mg IV Daily for total of 3 days.  -OT/PT Eval and treat.   Dr. Karsten Ro MD PGY1 [facility]

## 2020-06-05 NOTE — TOC Initial Note (Signed)
Transition of Care [facility]) - Initial/Assessment Note    Patient Details  Name: Mike Lowery MRN: [id number] Date of Birth: 14-Sep-1985  Transition of Care [facility]) CM/SW Contact:    Kermit Balo, RN Phone Number: 06/05/2020, 3:37 PM  Clinical Narrative:                 Pt is from home with his parents. He denies any issues with home medications or transportation.  Pt doesn't have PCP. CM will obtain him an appointment at the [facility] at d/c and place information on his AVS.  TOC following for further d/c needs.  Expected Discharge Plan: Home/Self Care Barriers to Discharge: Continued Medical Work up   Patient Goals and CMS Choice     Choice offered to / list presented to : Patient  Expected Discharge Plan and Services Expected Discharge Plan: Home/Self Care   Discharge Planning Services: CM Consult   Living arrangements for the past 2 months: Single Family Home                                      Prior Living Arrangements/Services Living arrangements for the past 2 months: Single Family Home Lives with:: Parents Patient language and need for interpreter reviewed:: Yes Do you feel safe going back to the place where you live?: Yes      Need for Family Participation in Patient Care: Yes (Comment) Care giver support system in place?: Yes (comment)   Criminal Activity/Legal Involvement Pertinent to Current Situation/Hospitalization: No - Comment as needed  Activities of Daily Living Home Assistive Devices/Equipment: None ADL Screening (condition at time of admission) Patient's cognitive ability adequate to safely complete daily activities?: Yes Is the patient deaf or have difficulty hearing?: No Does the patient have difficulty concentrating, remembering, or making decisions?: No Patient able to express need for assistance with ADLs?: Yes Does the patient have difficulty dressing or bathing?: No Independently performs ADLs?: Yes (appropriate for  developmental age) Does the patient have difficulty walking or climbing stairs?: No Weakness of Legs: None Weakness of Arms/Hands: None  Permission Sought/Granted                  Emotional Assessment   Attitude/Demeanor/Rapport: Engaged Affect (typically observed): Accepting Orientation: : Oriented to Self,Oriented to Place,Oriented to  Time,Oriented to Situation   Psych Involvement: No (comment)  Admission diagnosis:  Multiple sclerosis (HCC) [G35] Multiple sclerosis exacerbation (HCC) [G35] COVID-19 [U07.1] Patient Active Problem List   Diagnosis Date Noted  . COVID-19 virus infection 06/04/2020  . High risk sexual behavior 12/22/2017  . Immunosuppression (HCC) 12/22/2017  . Screening for human immunodeficiency virus 12/22/2017  . High risk medication use 12/21/2016  . Hepatitis B antibody positive 07/19/2016  . Spasticity 06/17/2016  . Spells 07/22/2015  . Depression with anxiety 07/02/2015  . Other fatigue 05/01/2015  . Numbness 05/01/2015  . Gait disturbance 05/01/2015  . Disturbed cognition 05/01/2015  . Erectile dysfunction 05/01/2015  . Multiple sclerosis exacerbation (HCC) 03/11/2015  . Tobacco abuse 03/06/2015  . Nausea without vomiting 11/08/2014  . 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 Relapsing remitting multiple sclerosis (HCC) 10/01/2014  . Swelling of both hands 05/14/2014  . Multiple sclerosis (HCC) 12/24/2013   PCP:  Patient, No Pcp Per Pharmacy:   [facility] [phone] - [place], [place] - [address] [place] [address] Phone: [phone] Fax: [phone]  [facility] (prev. [facility]) [place] - [address] [place] [address] Phone: [phone] Fax: [phone]     Social Determinants of Health (SDOH) Interventions    Readmission Risk Interventions No flowsheet data found.

## 2020-06-05 NOTE — Evaluation (Signed)
Occupational Therapy Evaluation Patient Details Name: Mike Lowery MRN: [id number] DOB: May 31, 1985 Today's Date: 06/05/2020    History of Present Illness Pt is a 35 year old with PMH of Depression and MS who presented with weakness of left  lower extremity and paresthesia in all extremities which feels like his typical flare. Pt found to be COVID positive. MRI Brain, Cervical spine and thoracic spine revealed no enhancing lesions. Pt admitted with MS exacerbation, incidental COVID +   Clinical Impression   Patient evaluated by Occupational Therapy with no further acute OT needs identified. All education has been completed and the patient has no further questions. Pt is able to perform ADLs mod I and demonstrates good safety awareness.  He reports he lives with his father and was independent with ADLs PTA. See below for any follow-up Occupational Therapy or equipment needs. OT is signing off. Thank you for this referral.      Follow Up Recommendations  No OT follow up    Equipment Recommendations  None recommended by OT    Recommendations for Other Services       Precautions / Restrictions Precautions Precautions: None      Mobility Bed Mobility Overal bed mobility: Independent             General bed mobility comments: Pt has been ambulating in room independently    Transfers Overall transfer level: Modified independent   Transfers: Sit to/from Stand Sit to Stand: Supervision         General transfer comment: had supervision but has been unplugging telemetry and ambulating to bathroom independently    Balance Overall balance assessment: Mild deficits observed, not formally tested                                         ADL either performed or assessed with clinical judgement   ADL Overall ADL's : Modified independent                                       General ADL Comments: Pt has been ambulating in room mod I.  He  was able to perform simulated showering in standing mod I without LOB.  He does support self with UE for single leg stance to simulate bathing feet     Vision Baseline Vision/History: No visual deficits Patient Visual Report: No change from baseline Vision Assessment?: No apparent visual deficits     Perception Perception Perception Tested?: Yes   Praxis Praxis Praxis tested?: Within functional limits    Pertinent Vitals/Pain Pain Assessment: No/denies pain     Hand Dominance Right   Extremity/Trunk Assessment Upper Extremity Assessment Upper Extremity Assessment: Overall WFL for tasks assessed;LUE deficits/detail LUE Deficits / Details: reports mild tingling Lt hand which is new, but does not impact his coordination   Lower Extremity Assessment Lower Extremity Assessment: Defer to PT evaluation RLE Deficits / Details: ROM WFL; MMT 5/5 LLE Deficits / Details: ROM WFL; MMT 4/5 throughout LLE Sensation: decreased light touch (throughout extremity)   Cervical / Trunk Assessment Cervical / Trunk Assessment: Normal   Communication Communication Communication: No difficulties   Cognition Arousal/Alertness: Awake/alert Behavior During Therapy: WFL for tasks assessed/performed Overall Cognitive Status: Within Functional Limits for tasks assessed  General Comments  VSS on RA    Exercises     Shoulder Instructions      Home Living Family/patient expects to be discharged to:: Private residence Living Arrangements: Parent Available Help at Discharge: Family;Available PRN/intermittently Type of Home: House Home Access: Stairs to enter [employer] of Steps: 7 Entrance Stairs-Rails: Right;Left;Can reach both Home Layout: One level     Bathroom Shower/Tub: Producer, television/film/video: Standard     Home Equipment: None   Additional Comments: Pt reports he lives with his father      Prior  Functioning/Environment Level of Independence: Independent        Comments: Pt reports he drives, does not work.  enjoys lifting weights        OT Problem List: Impaired balance (sitting and/or standing)      OT Treatment/Interventions:      OT Goals(Current goals can be found in the care plan section) Acute Rehab OT Goals Patient Stated Goal: to get better and go home (Simultaneous filing. User may not have seen previous data.) OT Goal Formulation: All assessment and education complete, DC therapy  OT Frequency:     Barriers to D/C:            Co-evaluation              AM-PAC OT "6 Clicks" Daily Activity     Outcome Measure Help from another person eating meals?: None Help from another person taking care of personal grooming?: None Help from another person toileting, which includes using toliet, bedpan, or urinal?: None Help from another person bathing (including washing, rinsing, drying)?: None Help from another person to put on and taking off regular upper body clothing?: None Help from another person to put on and taking off regular lower body clothing?: None 6 Click Score: 24   End of Session Nurse Communication: Mobility status  Activity Tolerance: Patient tolerated treatment well Patient left: in bed;with call bell/phone within reach  OT Visit Diagnosis: Unsteadiness on feet (R26.81)                Time: 8938-1017 OT Time Calculation (min): 9 min Charges:  OT General Charges $OT Visit: 1 Visit OT Evaluation $OT Eval Low Complexity: 1 Low  Eber Jones., OTR/L Acute Rehabilitation Services Pager [phone] Office [phone]   Jeani Hawking M 06/05/2020, 1:35 PM

## 2020-06-06 LAB — CBC WITH DIFFERENTIAL/PLATELET
Abs Immature Granulocytes: 0.03 10*3/uL (ref 0.00–0.07)
Basophils Absolute: 0 10*3/uL (ref 0.0–0.1)
Basophils Relative: 0 %
Eosinophils Absolute: 0 10*3/uL (ref 0.0–0.5)
Eosinophils Relative: 0 %
HCT: 43.9 % (ref 39.0–52.0)
Hemoglobin: 14.4 g/dL (ref 13.0–17.0)
Immature Granulocytes: 0 %
Lymphocytes Relative: 9 %
Lymphs Abs: 0.6 10*3/uL — ABNORMAL LOW (ref 0.7–4.0)
MCH: 29.1 pg (ref 26.0–34.0)
MCHC: 32.8 g/dL (ref 30.0–36.0)
MCV: 88.9 fL (ref 80.0–100.0)
Monocytes Absolute: 0.2 10*3/uL (ref 0.1–1.0)
Monocytes Relative: 3 %
Neutro Abs: 5.8 10*3/uL (ref 1.7–7.7)
Neutrophils Relative %: 88 %
Platelets: 306 10*3/uL (ref 150–400)
RBC: 4.94 MIL/uL (ref 4.22–5.81)
RDW: 12.8 % (ref 11.5–15.5)
WBC: 6.7 10*3/uL (ref 4.0–10.5)
nRBC: 0 % (ref 0.0–0.2)

## 2020-06-06 LAB — GLUCOSE, CAPILLARY
Glucose-Capillary: 96 mg/dL (ref 70–99)
Glucose-Capillary: 105 mg/dL — ABNORMAL HIGH (ref 70–99)

## 2020-06-06 LAB — COMPREHENSIVE METABOLIC PANEL
ALT: 34 U/L (ref 0–44)
AST: 26 U/L (ref 15–41)
Albumin: 4 g/dL (ref 3.5–5.0)
Alkaline Phosphatase: 66 U/L (ref 38–126)
Anion gap: 11 (ref 5–15)
BUN: 11 mg/dL (ref 6–20)
CO2: 25 mmol/L (ref 22–32)
Calcium: 9.1 mg/dL (ref 8.9–10.3)
Chloride: 104 mmol/L (ref 98–111)
Creatinine, Ser: 1.01 mg/dL (ref 0.61–1.24)
GFR, Estimated: >60 mL/min (ref >60)
Glucose, Bld: 136 mg/dL — ABNORMAL HIGH (ref 70–99)
Potassium: 4.4 mmol/L (ref 3.5–5.1)
Sodium: 140 mmol/L (ref 135–145)
Total Bilirubin: 0.4 mg/dL (ref 0.3–1.2)
Total Protein: 6.4 g/dL — ABNORMAL LOW (ref 6.5–8.1)

## 2020-06-06 LAB — D-DIMER, QUANTITATIVE: D-Dimer, Quant: <0.27 ug/mL-FEU (ref 0.00–0.50)

## 2020-06-06 LAB — FERRITIN: Ferritin: 122 ng/mL (ref 24–336)

## 2020-06-06 LAB — C-REACTIVE PROTEIN: CRP: 0.8 mg/dL (ref <1.0)

## 2020-06-06 MED ORDER — SODIUM CHLORIDE 0.9 % IV SOLN
500.0000 mg | INTRAVENOUS | Status: AC
  Administered 2020-06-06: 500 mg via INTRAVENOUS
  Filled 2020-06-06: qty 4

## 2020-06-06 NOTE — Discharge Instructions (Signed)
[name],  It was a pleasure taking care of you. You were admitted to the hospital for an exacerbation of your multiple sclerosis which was likely precipitated by your COVID-19 infection. You were evaluated by neurology and treated with with 3 days of intravenous steroids.  Recommend you reach out to your neurologist to schedule a hospital follow-up either virtual before your isolation ends or in-person after your isolation ends.  As for your COVID-19 infection, because you have been minimally symptomatic, you did not receive treatment in the hospital. We recommend you continue to isolate yourself from others for a total of at least 5 days (through tomorrow, 1/21). Ideally, you would isolate or at the very least wear a mask and maintain distance from others for a total of 10 days (1/22-26).   Also recommend you get vaccinated against COVID 3 months from now. You will have some degree of natural immunity from having COVID, however that will wear off after ~3 months, and you can still get significant benefit from the vaccine, especially given your MS.  COVID-19 Vaccine: We highly recommend getting vaccinated against COVID-19.  - The vaccines are SAFE.They went through all the required stages of clinical trials, and extensive testing and monitoring have shown that these vaccines are safe and effective. - The vaccines are EFFECTIVE. They lower your chances of getting and spreading the virus. They are proven to reduce your chance of severe illness, hospitalization, and death from COVID-19. - Getting vaccinated protects you, your loved ones, and people at risk for severe illness from COVID-19. - You can get vaccinated at any major pharmacy. If you are interested in setting up an appointment to get vaccinated through [facility], you can call the [facility] COVID-19 hotline at [phone].

## 2020-06-06 NOTE — Plan of Care (Signed)
  Problem: Education: Goal: Knowledge of risk factors and measures for prevention of condition will improve Outcome: Progressing   Problem: Coping: Goal: Psychosocial and spiritual needs will be supported Outcome: Progressing   Problem: Respiratory: Goal: Will maintain a patent airway Outcome: Progressing   

## 2020-06-06 NOTE — Discharge Summary (Signed)
Name: Mike Lowery MRN: [id number] DOB: [birth year] 35 y.o. PCP: Patient, No Pcp Per  Date of Admission: 06/04/2020 12:11 AM Date of Discharge: 06/06/2020 Attending Physician: Gust Rung, DO  Discharge Diagnosis:  1. Multiple sclerosis exacerbation 2. COVID-19 infection  Discharge Medications: Allergies as of 06/06/2020   No Known Allergies     Medication List    STOP taking these medications   benzonatate 100 MG capsule Commonly known as: TESSALON   lidocaine 4 % cream Commonly known as: LMX   sodium chloride 0.65 % Soln nasal spray Commonly known as: OCEAN   triamcinolone 55 MCG/ACT Aero nasal inhaler Commonly known as: NASACORT     TAKE these medications   amphetamine-dextroamphetamine 20 MG 24 hr capsule Commonly known as: Adderall XR Take 1 capsule (20 mg total) by mouth daily.   baclofen 10 MG tablet Commonly known as: LIORESAL Take 1 tablet (10 mg total) by mouth 4 (four) times daily as needed for muscle spasms.   ibuprofen 200 MG tablet Commonly known as: ADVIL Take 400 mg by mouth every 6 (six) hours as needed for moderate pain. What changed: Another medication with the same name was removed. Continue taking this medication, and follow the directions you see here.   ocrelizumab 300 MG/10ML injection Commonly known as: Ocrevus Inject 20 mLs (600 mg total) into the vein every 6 (six) months.       Disposition and follow-up:   Mike Lowery was discharged from [facility] in Good condition.  At the hospital follow up visit please address:  1.    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  Labs / imaging  needed at time of follow-up: none  3.  Pending labs/ test needing follow-up: none  Follow-up Appointments:  Follow-up Information    [facility] Follow up on 06/13/2020.   Specialty: Family Medicine Why: Your appointment is at 10:00 am. this appointment may be virtual.  Contact information: [address] [place] [address] [phone]              Hospital Course by problem list:  Mike Lowery is a 35 y.o. man with a past medical history of multiple sclerosis (on Ocrevus IV infusions,last in August 2021) and depression who presented with left-sided weakness and numbness and was admitted for suspected MS flair/exacerbation in the setting of COVID-19 infection.  Multiple sclerosis exacerbation in setting of COVID-19 infection MRI brain, cervical spine, and thoracic spine without enhancing lesions, however per neurology, reasonable to treat patient with short course of steroids since flares can present without imaging findings on certain immunomodulating therapies. He was treated with IV methylprednisolone 500 mg daily for 3 days with significant improvement in his symptoms. Also evaluated by PT who did not recommend additional follow-up.  COVID-19 infection Patient presented with a 1-day history of subjective fever and chills, mild cough, and mild shortness of breath. Febrile on admission with Tmax 102.3 however vital signs otherwise stable. Fever curve subsequently downtrended. Inflammatory markers not elevated. No indication for remdesivir or steroids. Discharged home with instructions for isolation.   Steroid-induced hyperglycemia Hgb A1c 4.9%. Placed on SSI sensitive during treatment with high-dose steroids, only required 2 u Novolog  during admission.    Discharge Vitals:   BP (!) 91/55 (BP Location: Right Arm)   Pulse 65   Temp 98.3 F (36.8 C) (Axillary)   Resp 20   SpO2 93%   Pertinent Labs, Studies, and Procedures:   06/04/20  MR THORACIC SPINE W WO CONTRAST  CLINICAL DATA:  Multiple sclerosis, new event. Weakness. Paresthesias. EXAM: MRI HEAD WITHOUT AND WITH CONTRAST MRI CERVICAL AND THORACIC SPINE WITHOUT AND WITH CONTRAST TECHNIQUE: Multiplanar, multiecho pulse sequences of the brain and surrounding structures were obtained without and with intravenous contrast. Multiplanar and multiecho pulse sequences of the cervical spine, to include the craniocervical junction and cervicothoracic junction, and the thoracic spine, were obtained without and with intravenous contrast. CONTRAST:  89mL GADAVIST GADOBUTROL 1 MMOL/ML IV SOLN COMPARISON:  MRI head and cervical spine 12/28/2019. No prior MRI of the thoracic spine. FINDINGS: MRI HEAD FINDINGS Brain: There are numerous periventricular, juxta cortical and subcortical T2/FLAIR hyperintensities within the supratentorial brain, which are not substantially changed relative to the prior. There also similar T2/FLAIR hyperintensities involving the left superior and bilateral middle cerebellar peduncles. No new lesions identified. No enhancing lesions or other abnormal enhancement. No hydrocephalus. No acute infarct. No extra-axial fluid collection. Vascular: Major arterial flow voids are maintained at the skull base. Skull and upper cervical spine: Sinuses/orbits: Mucosal thickening of bilateral inferior maxillary sinuses. Other: No mastoid effusions. MRI CERVICAL SPINE FINDINGS Alignment: Straightening of the normal cervical lordosis without substantial sagittal subluxation. Vertebrae: Vertebral body heights are maintained. No evidence of acute fracture, discitis/osteomyelitis, or suspicious bone lesion. Cord: There are numerous T2/STIR hyperintense cord lesions in the cervical spine. This includes a dorsal short-segment lesion at C2, predominantly left a centric lesion centered at C2-C3, dorsal lesion centered at C3, dorsal and right eccentric lesions spanning from C4 to C6, and left eccentric  lesion at the cervicothoracic junction. Although the extent of cord signal abnormality limits evaluation, no definite new lesion identified. Postcontrast imaging is degraded by motion without evidence of enhancing cord lesion. Vertebral arteries, paraspinal tissues: Visualized vertebral flow voids are maintained. Disc levels: No significant canal stenosis. Mild multilevel uncovertebral hypertrophy with mild multilevel foraminal stenosis. MRI THORACIC SPINE FINDINGS Alignment:  Normal Vertebrae: Vertebral body heights are maintained. No evidence of acute fracture, discitis/osteomyelitis, or suspicious bone lesion. Cord: Multifocal short-segment cord signal abnormality throughout the thoracic cord. This includes a left eccentric lesion at the cervicothoracic junction, predominantly central/dorsal lesions centered at T2/T3, T4/T5, and T5/T6, dorsal and left eccentric lesion at T7-T8, nearly holo cord lesion centered at T9, and left dorsal lesion at T10-T11. No substantial cord expansion. No evidence of enhancing lesion. Paraspinal and other soft tissues: Partially imaged T2 hyperintense renal lesions, incompletely characterized but most likely cysts. Disc levels: No substantial focal degenerative change. No significant canal or foraminal stenosis. IMPRESSION: MRI head: 1. Similar infratentorial and supratentorial T2/FLAIR hyperintense white matter lesions, compatible with the sequela of the patient's reported multiple sclerosis. 2. No enhancing lesions to suggest active demyelination. 3. No acute abnormality. MRI cervical spine: 1. Similar extensive cord signal abnormality throughout the cervical spine without definite new cord lesion, compatible with prior demyelination. 2. No abnormal cord enhancement to suggest active demyelination. MRI thoracic spine: 1. Extensive cord signal abnormality throughout the thoracic spine, compatible with demyelination. No priors available for comparison. 2. No abnormal cord enhancement  to suggest active demyelination. Electronically Signed   By: Feliberto Harts MD   On: 06/04/2020 11:23   06/04/20 DG Chest Portable  1 View   CLINICAL DATA:  Cough EXAM: PORTABLE CHEST 1 VIEW COMPARISON:  None. FINDINGS: The heart size and mediastinal contours are within normal limits. Both lungs are clear. The visualized skeletal structures are unremarkable. IMPRESSION: No active disease. Electronically Signed   By: Katherine Mantle M.D.   On: 06/04/2020 00:58    Discharge Instructions: Discharge Instructions    Call MD for:  difficulty breathing, headache or visual disturbances   Complete by: As directed    Call MD for:  extreme fatigue   Complete by: As directed    Call MD for:  hives   Complete by: As directed    Call MD for:  persistant dizziness or light-headedness   Complete by: As directed    Call MD for:  persistant nausea and vomiting   Complete by: As directed    Call MD for:  severe uncontrolled pain   Complete by: As directed    Call MD for:  temperature >100.4   Complete by: As directed    Increase activity slowly   Complete by: As directed       Mr. Lewman,  It was a pleasure taking care of you. You were admitted to the hospital for an exacerbation of your multiple sclerosis which was likely precipitated by your COVID-19 infection. You were evaluated by neurology and treated with with 3 days of intravenous steroids.  Recommend you reach out to your neurologist to schedule a hospital follow-up either virtual before your isolation ends or in-person after your isolation ends.  As for your COVID-19 infection, because you have been minimally symptomatic, you did not receive treatment in the hospital. We recommend you continue to isolate yourself from others for a total of at least 5 days (through tomorrow, 1/21). Ideally, you would isolate or at the very least wear a mask and maintain distance from others for a total of 10 days (1/22-26).   Also recommend you get  vaccinated against COVID 3 months from now. You will have some degree of natural immunity from having COVID, however that will wear off after ~3 months, and you can still get significant benefit from the vaccine, especially given your MS.  COVID-19 Vaccine: We highly recommend getting vaccinated against COVID-19.  - The vaccines are SAFE.They went through all the required stages of clinical trials, and extensive testing and monitoring have shown that these vaccines are safe and effective. - The vaccines are EFFECTIVE. They lower your chances of getting and spreading the virus. They are proven to reduce your chance of severe illness, hospitalization, and death from COVID-19. - Getting vaccinated protects you, your loved ones, and people at risk for severe illness from COVID-19. - You can get vaccinated at any major pharmacy. If you are interested in setting up an appointment to get vaccinated through [facility], you can call the [facility] COVID-19 hotline at [phone].   Signed: Alphonzo Severance, MD 06/06/2020, 1:49 PM   Pager: [phone]

## 2020-06-06 NOTE — Progress Notes (Signed)
HD#2 Subjective:   No acute events overnight.  During evaluation at bedside this morning, the patient reports he feels about the same as yesterday, however during physical exam noted that he is stronger. Discussed our recommendation for him to continue his isolation for at least 5 days total including this hospital stay, ideally 10 days. Also recommended vaccination against COVID ~3 months. He states he is not interested in vaccination.  Objective:   Vital signs in last 24 hours: Vitals:   06/05/20 1227 06/05/20 1644 06/05/20 2051 06/06/20 0550  BP: (!) 109/57 (!) 106/55 115/67 (!) 91/55  Pulse: 72 71 66 65  Resp: 20 17 (!) 22 20  Temp: 98.9 F (37.2 C) 98.2 F (36.8 C) 98.3 F (36.8 C) 98.3 F (36.8 C)  TempSrc: Oral Oral Axillary Axillary  SpO2: 96% 96% 93% 93%   Supplemental O2: None  Physical Exam Constitutional: well-appearing man sitting on edge of bed, in no acute distress Cardiovascular: regular rate and rhythm, no m/r/g Pulmonary/Chest: normal work of breathing on room air, lungs clear to auscultation bilaterally Neurological: alert & oriented x 3, 5/5 strength in RUE, RLE. 5/5 strength with L tricep, 5/5 strength with R bicep, 5/5 bilateral grip strength; 4+/5 strength with L hip flexion, 5/5 strength with L hip extension. Very mildly decreased sensation in the LUE, trunk, and LLE  Pertinent Labs: CBC Latest Ref Rng & Units 06/06/2020 06/05/2020 06/04/2020  WBC 4.0 - 10.5 K/uL 6.7 3.6(L) 6.0  Hemoglobin 13.0 - 17.0 g/dL 69.4 85.4 62.7  Hematocrit 39.0 - 52.0 % 43.9 43.9 42.0  Platelets 150 - 400 K/uL 306 284 326    CMP Latest Ref Rng & Units 06/06/2020 06/05/2020 06/04/2020  Glucose 70 - 99 mg/dL 035(K) 093(G) 93  BUN 6 - 20 mg/dL 11 8 9   Creatinine 0.61 - 1.24 mg/dL 1.82 9.93  Sodium 135 - 145 mmol/L 140 137 136  Potassium 3.5 - 5.1 mmol/L 4.4 4.4 3.9  Chloride 98 - 111 mmol/L 104 103 104  CO2 22 - 32 mmol/L 25 24 22   Calcium 8.9 - 10.3 mg/dL 9.1 9.1  7.16)  Total Protein 6.5 - 8.1 g/dL 6.4(L) 6.6 6.2(L)  Total Bilirubin 0.3 - 1.2 mg/dL 0.4 ) 0.7  Alkaline Phos 38 - 126 U/L 66 69 70  AST 15 - 41 U/L 26 29 24   ALT 0 - 44 U/L 34 38 24    Imaging: None   Assessment/Plan:   Active Problems:   Multiple sclerosis exacerbation (HCC)   COVID-19 virus infection   Patient Summary: Mike Lowery is a 35 y.o. man with a past medical history of multiple sclerosis (on Ocrevus IV infusions, last in August 2021) and depression who presented with left-sided weakness and numbness and was admitted for suspected MS flair/exacerbation in the setting of COVID-19 infection.  This is hospital day 2.   Multiple sclerosis exacerbation in setting of COVID-19 infection Patient with continued improvement in L-sided weakness and sensory deficit. Plan for discharge home after 3rd steroid dose today.  - Neurology consulted, appreciate recs - IV methylprednisolone 500 mg daily day 3 of 3 (Day 1: 06/04/20) - PT/OT evaluation: no follow-up - CBG monitoring with SSI as below  COVID-19 infection Patient reports persistent mild cough, no shortness of breath. Afebrile overnight. VSS, maintaining O2 sat on room air. Inflammatory markers not elevated. - Airborne precausions - No indication for Remdesivir - Methylprednisolone for MS flare as above - Today is day 4 of symptoms.  Patient will need to isolate for at least 5 days total, ideally 10. - Recommend COVID vaccination in 3 months  Steroid-induced hyperglycemia Hgb A1c 4.9%. AM CBGs 130s.  - SSI sensitive while on high-dose steroids  Diet: Normal IVF: None VTE: Enoxaparin Code: Full PT/OT recs: No PT follow-up TOC recs: Patient lives at home with his parents. He does not have PCP, so CM will arrange for appointment at the [facility] clinic at discharge.   Dispo: Anticipated discharge to Home in 0 days pending completion of IV steroid treatment.    Please contact the on call pager after 5 pm and  on weekends at [phone].  Alphonzo Severance, MD PGY-1 Internal Medicine Teaching Service Pager: [phone] 06/06/2020

## 2020-06-13 ENCOUNTER — Encounter: Payer: Medicare Other | Admitting: Nurse Practitioner

## 2020-06-13 NOTE — Progress Notes (Deleted)
Virtual Visit via Telephone Note  I connected with Mike Lowery on 06/13/20 at 10:00 AM EST by telephone and verified that I am speaking with the correct person using two identifiers.  Location: Patient: *** Provider: ***   I discussed the limitations, risks, security and privacy concerns of performing an evaluation and management service by telephone and the availability of in person appointments. I also discussed with the patient that there may be a patient responsible charge related to this service. The patient expressed understanding and agreed to proceed.   History of Present Illness:    Observations/Objective:   Assessment and Plan:   Follow Up Instructions:    I discussed the assessment and treatment plan with the patient. The patient was provided an opportunity to ask questions and all were answered. The patient agreed with the plan and demonstrated an understanding of the instructions.   The patient was advised to call back or seek an in-person evaluation if the symptoms worsen or if the condition fails to improve as anticipated.  I provided *** minutes of non-face-to-face time during this encounter.   Ivonne Andrew, NP

## 2020-06-17 NOTE — Progress Notes (Signed)
Erroneous encounter

## 2020-06-24 ENCOUNTER — Encounter: Payer: Self-pay | Admitting: Family Medicine

## 2020-06-24 ENCOUNTER — Ambulatory Visit (INDEPENDENT_AMBULATORY_CARE_PROVIDER_SITE_OTHER): Payer: Medicare Other | Admitting: Family Medicine

## 2020-06-24 ENCOUNTER — Other Ambulatory Visit (HOSPITAL_COMMUNITY): Payer: Self-pay | Admitting: Neurology

## 2020-06-24 VITALS — BP 105/61 | HR 70 | Ht 67.0 in | Wt 149.2 lb

## 2020-06-24 DIAGNOSIS — R269 Unspecified abnormalities of gait and mobility: Secondary | ICD-10-CM | POA: Diagnosis not present

## 2020-06-24 DIAGNOSIS — G35 Multiple sclerosis: Secondary | ICD-10-CM

## 2020-06-24 DIAGNOSIS — R4189 Other symptoms and signs involving cognitive functions and awareness: Secondary | ICD-10-CM | POA: Diagnosis not present

## 2020-06-24 DIAGNOSIS — M25562 Pain in left knee: Secondary | ICD-10-CM

## 2020-06-24 DIAGNOSIS — G8929 Other chronic pain: Secondary | ICD-10-CM

## 2020-06-24 DIAGNOSIS — Z79899 Other long term (current) drug therapy: Secondary | ICD-10-CM

## 2020-06-24 DIAGNOSIS — R252 Cramp and spasm: Secondary | ICD-10-CM

## 2020-06-24 DIAGNOSIS — R5383 Other fatigue: Secondary | ICD-10-CM

## 2020-06-24 MED ORDER — MELOXICAM 7.5 MG PO TABS: 7.5000 mg | ORAL_TABLET | Freq: Every day | ORAL | 1 refills | Status: DC

## 2020-06-24 NOTE — Progress Notes (Signed)
PATIENT: Mike Lowery DOB: 25-Jan-1986  REASON FOR VISIT: follow up HISTORY FROM: patient  Chief Complaint  Patient presents with  . Follow-up    Room 2 - alone. MS - He has continued Ocrevus. He has noticed increase stiffness in his left knee that is causing him gait difficulty. He is unable to fully bend it without pain.      HISTORY OF PRESENT ILLNESS: Today 06/24/20  He returns for RRMS follow up. He continues to tolerate Ocrevus infusions, last in 12/2019. Next infusion scheduled for tomorrow with short stay. MRI 12/2019 stable. Labs have been stable.   He was admitted to [facility] on 06/04/20 following concerns of MS exacerbation in the setting of positive COVID-19 infection. He reported generalized weakness and left sided numbness x 1 day. Imaging was unremarkable with no new lesions noted. He was treated with IV steroids, 500mg  QD x 3 days with significant improvement in symptoms. PT eval but no follow up recommended. He has felt well, since, with the exception of knee pain.   He was seen in the ER 03/24/20 for left knee pain following a fall. He felt that his knee just gave out on him and he landed on left knee. Exam unremarkable. Pain started about a year ago and waxes and wanes but seems worse since fall. He has a hard time fully extending left leg. Some trouble with flexion. No falls. Stiffness worse after being still for extended periods. No history of gastric bleeds/ulcers and no kidney disease.   Otherwise, no changes in MS symptoms. He feels he is doing really good from a MS perspective. He continues Adderall that helps with attention and fatigue. Baclofen continues to help with spasticity. No changes in vision, bowel or bladder habits. Mood and sleep stable.    12/21/2019 RS:  Mike Lowery,is a 35 y.o. man with relapsing remitting multiple sclerosis.    Update 12/21/2019: He is on Ocrevus and tolerates it well.   His last infusion was in January 2021 and he is scheduled for next  one in a week.    He has not had any exacerbation while on it.     CBC was ok.   IgM and IgA were slightly reduced at last visit  He feels gait is better and he is walking about 1/4 mile without stopping.   He has some stumbling but no falls.   He has noted spasticity mostly in his left leg.   Left leg is weaker.  February 2021He notes some neck pain.    He denies numbness or tingling.  He has mild urinary hesitancy..   No frank incontinence.    Vision is doing well.  He notes mild fatigue that fluctuates a lot.  This has improved as he became more active.  He sleeps poorly some nights.    He denies depression or anxiety.   He has attention deficit / mental fog, better with Adderall.    He has not had his vaccination but he will consider it.   We discussed best timing is about 4 1/2 months after infusion.   06/22/2019 ALL:  Mike Lowery is a 35 y.o. male here today for follow up for MS. He continues Ocrevus (started in 07/2016) and is tolerating well. Last infusion was in December, 2020. He feels MS is stable. No exacerbating symptoms. He continues Adderall XR 20mg  daily for fatigue. Baclofen helps with stiffness and spasticity but he rarely takes it. Last MRI cervical spine and brain  stable in 12/2017. He reports a fall yesterday where he tripped carrying the trash out. He denies weakness or changes in gait. He did scrape his toe but no other injuries. No vision changes. No changes with bowel or bladder.    HISTORY: (copied from Dr Bonnita HollowSater's note on 12/27/2018)  Mike Lowery,is a 35 y.o. man with multiple sclerosis.    Update 12/27/2018: He is on Ocrevus and tolerates it well.   His last infusion was in July.   He has not had any exacerbation while on it.     CBC was ok.   IgG and IgM were normal though IgA was slightly reduced.      He feels gait is about the same with some stumbles but no falls.   He notes mild left leg weakness.  He does not note much spasticity.  Spasticity was worse last year.  He  notes a cramp in his neck at times.  When pain is more severe he takes hydrocodone.  He denies numbness or tingling.   He has had a little urinary leakage after urinating and notes some hesitancy.   No frank incontinence.    Vision is doing well.  He notes mild fatigue that fluctuates a lot.    He sleeps poorly some nights.    He denies depression or anxiety.   He has attention deficit / mental fog, better with Adderall.    Update 06/28/2018: He is on Ocrevus and he tolerates it well.    His last infusion was in December 2019.    He has tolerated it well.   He denies any exacerbations.    He stumbles now and then but gait has done well fo the most part.    He needs to be careful going down stairs but is fine going up.     The legs are strong.   He denies numbness or tingling in his legs.   Sometimes he has hand tingling.   Vision is doing the same with color desaturation on the right.     Bladder is doing well.   His fatigue is mild.    He has gone back to exercising.    MRI's of the brain and cervical spine 01/01/2018 did not show any new lesions compared to the 07/16/2016 scans.    IgG/IgM 06/23/2017 and CBC/D were fine.   He feels cognition is stable.     Update 12/22/2017: He is on ocrelizumab and is tolerating it well.    His last infusion was in March 2019.      No exacerbatins or new symptoms.   His last brain MRI was March 2018 and it had shown progression compared to the 2016 MRI.  Some of the lesions were enhancing.  He also had a new cervical spinal cord lesion C3-C4 without enhancement.   Blood work at the last visit showed normal IgG/IgM.  He feels his balance is off.   Gait is spastic but still much better than 2 years ago.    He has some spasticity but no longer gets the phasic spasms.    He has started drinking again and continues to use marijuana.    We discussed that those may worsen his balance and increase risk of falls.   Bladder function is doing well.    Ed is less of a problem.    He  has had unprotected sex with multiple partners.  He notes fatigue but less than last year.  MJ worsens his  fatigue.   Adderall has helped.        Update 06/23/2017: He feels gait is much better.   His balance is still poor but he does not need a cane.    He denies numbness or weakness now and no longer gets spasms   He denies any problems with bladder.    Vision is worse out of the right eye than the left.      He feels fatigue is helped a lot by Adderall.    He feels focus and attention are also better on Adderall.   Mood is doing better this year than last year. He is sleeping well at night.   From 12/21/2016: MS:   He had his 2 split doses of ocrelizumab on 08/05/2016 and 08/19/2016 and his second dose  03/17/2017.   He is doing much better. He tolerates the infusions well.    He switched to the ocrelizumab due to large exacerbation in early March 2018. MRI showed multiple enhancing lesions of the brain and 1 new lesion at C4.   He tolerated the ocrelizumab well. He feels much better since the infusion has not had any new symptoms and notes that some of the old symptoms improved   Spasms/spells:    He has tonic spasticity in his legs but no longer has the phasic spasticity. He has no more spells of drawing up in the arm or leg.    Gait/strength/sensation: He feels he is walking better than he did earlier this year when he had an exacerbation. Gait was even worse last year and a 2016 due to his initial exacerbation.    He also notes painful dysesthesias involving the legs and left > right arms..   This has also improved over the past few months.   Gabapentin 3 x 300 mg daily helps  Baclofen is helping the tonic spasticity.    .    Bladder:    He has mild urinary frequency and urgency. There is no incontinence. His ED is better  Fatigue/sleep:    He notes fatigue that is cognitive more than physical. Adderall has helped. He also feels it has helped his focus. He does not take every day.    Mood:  Mood is much better this year than last year and he no longer takes Cymbalta.  Cognition:   He is doing better with improved attention and focus. He still has some word finding difficulties but these are not as bad as they were last year.  Adderall helps his cognitive skills and attention and helps his fatigue and sleepiness a lot.  Tolerates the current dose well.  MS HISTORY:  In mid 2015, he had the onset of weakness and numbness in the arms and legs, a little worse on the left.   When symptoms persisted, he presented to [facility] emergency room. MRI was consistent with multiple sclerosis and he was admitted. The MRI of the brain showed several plaques, mostly in the periventricular white matter. The MRI of the cervical spine showed several large T2 hyperintense foci, with some enhancement. He was given IV Solu-Medrol and his symptoms improved quite a bit. He followed up with Dr. Everlena Cooper and was placed on Plegridy about 8 or 9 months after starting Plegridy, he had another exacerbation and MRI of the brain and cervical spine were performed again 02/21/2015. The MRI of the cervical spine does not show any definite new lesions. However, the MRI of the brain does show several new lesions  including for small enhancing foci. He was switched to [facility] November or December 2016.   I have personally reviewed the MRIs of the brain and cervical spine from October 2016. The MRI of the brain shows multiple T2/FLAIR hyperintense foci consistent with MS in the hemispheres and brainstem.   4 of the hemispheric small foci enhanced after gadolinium administration. The MRI of the cervical spine shows multiple hyperintense foci. There was a normal enhancement pattern.   He had a large exacerbation while on Tecfidera with new gait difficulties March 2018.   MRI's show multiple new lesions in the brain with several lesions enhancing including a larger one in the left periventrivcular white matter.    Also has a  new focus adjacent to C4 in the spinal cord.  He was switched to ocrelizumab and had his first infusion at the end of March 2018    REVIEW OF SYSTEMS: Out of a complete 14 system review of symptoms, the patient complains only of the following symptoms, left knee pain, fatigue and all other reviewed systems are negative.   ALLERGIES: No Known Allergies  HOME MEDICATIONS: Outpatient Medications Prior to Visit  Medication Sig Dispense Refill  . amphetamine-dextroamphetamine (ADDERALL XR) 20 MG 24 hr capsule Take 1 capsule (20 mg total) by mouth daily. 30 capsule 0  . baclofen (LIORESAL) 10 MG tablet Take 1 tablet (10 mg total) by mouth 4 (four) times daily as needed for muscle spasms. 120 tablet 11  . ibuprofen (ADVIL,MOTRIN) 200 MG tablet Take 400 mg by mouth every 6 (six) hours as needed for moderate pain.     Marland Kitchen ocrelizumab (OCREVUS) 300 MG/10ML injection Inject 20 mLs (600 mg total) into the vein every 6 (six) months. 20 mL 0   No facility-administered medications prior to visit.    PAST MEDICAL HISTORY: Past Medical History:  Diagnosis Date  . Depression    situaltional  . Dyspnea   . Multiple sclerosis (HCC) 12/24/13  . Multiple sclerosis (HCC)   . Vision abnormalities     PAST SURGICAL HISTORY: Past Surgical History:  Procedure Laterality Date  . NO PAST SURGERIES      FAMILY HISTORY: Family History  Problem Relation Age of Onset  . Healthy Mother   . Healthy Father   . Cancer Maternal Grandmother        unknown   . Ataxia Sister   . Multiple sclerosis Neg Hx     SOCIAL HISTORY: Social History   Socioeconomic History  . Marital status: Single    Spouse name: Not on file  . Number of children: Not on file  . Years of education: Not on file  . Highest education level: Not on file  Occupational History  . Not on file  Tobacco Use  . Smoking status: Former Smoker    Types: Cigarettes    Quit date: 04/18/2013    Years since quitting: 7.1  . Smokeless  tobacco: Former Neurosurgeon    Quit date: 09/08/2014  Substance and Sexual Activity  . Alcohol use: Yes  . Drug use: No    Types: Marijuana    Comment: former  . Sexual activity: Yes    Partners: Female  Other Topics Concern  . Not on file  Social History Narrative  . Not on file   Social Determinants of Health   Financial Resource Strain: Not on file  Food Insecurity: Not on file  Transportation Needs: Not on file  Physical Activity: Not on file  Stress: Not  on file  Social Connections: Not on file  Intimate Partner Violence: Not on file      PHYSICAL EXAM  Vitals:   06/24/20 0923  BP: 105/61  Pulse: 70  Weight: 149 lb 3.2 oz (67.7 kg)  Height: 5\' 7"  (1.702 m)   Body mass index is 23.37 kg/m.  Generalized: Well developed, in no acute distress  Cardiology: normal rate and rhythm, no murmur noted Respiratory: clear to auscultation bilaterally  Neurological examination  Mentation: Alert oriented to time, place, history taking. Follows all commands speech and language fluent Cranial nerve II-XII: Pupils were equal round reactive to light. Extraocular movements were full, visual field were full on confrontational test. Facial sensation and strength were normal. Head turning and shoulder shrug  were normal and symmetric. Motor: The motor testing reveals 5 over 5 strength of all 4 extremities. Good symmetric motor tone is noted throughout. ROM intact but pain reported with full extension, No obvious edema noted, no pain with palpation of knee joint.  Sensory: Sensory testing is intact to soft touch on all 4 extremities. No evidence of extinction is noted.  Coordination: Cerebellar testing reveals good finger-nose-finger and heel-to-shin bilaterally.  Gait and station: Gait is stable without assistive device, slight left limp, tandem not attempted today due to knee pain    DIAGNOSTIC DATA (LABS, IMAGING, TESTING) - I reviewed patient records, labs, notes, testing and imaging  myself where available.  No flowsheet data found.   Lab Results  Component Value Date   WBC 6.7 06/06/2020   HGB 14.4 06/06/2020   HCT 43.9 06/06/2020   MCV 88.9 06/06/2020   PLT 306 06/06/2020      Component Value Date/Time   NA 140 06/06/2020 0113   NA 145 (H) 06/22/2019 0850   K 4.4 06/06/2020 0113   CL 104 06/06/2020 0113   CO2 25 06/06/2020 0113   GLUCOSE 136 (H) 06/06/2020 0113   BUN 11 06/06/2020 0113   BUN 9 06/22/2019 0850   CREATININE 1.01 06/06/2020 0113   CREATININE 0.92 03/27/2015 1130   CALCIUM 9.1 06/06/2020 0113   PROT 6.4 (L) 06/06/2020 0113   PROT 6.2 06/22/2019 0850   ALBUMIN 4.0 06/06/2020 0113   ALBUMIN 4.2 06/22/2019 0850   AST 26 06/06/2020 0113   ALT 34 06/06/2020 0113   ALKPHOS 66 06/06/2020 0113   BILITOT 0.4 06/06/2020 0113   BILITOT 0.2 06/22/2019 0850   GFRNONAA >60 06/06/2020 0113   GFRNONAA >89 10/10/2014 1251   GFRAA 103 06/22/2019 0850   GFRAA >89 10/10/2014 1251   No results found for: CHOL, HDL, LDLCALC, LDLDIRECT, TRIG, CHOLHDL Lab Results  Component Value Date   HGBA1C 4.9 06/05/2020   No results found for: VITAMINB12 No results found for: TSH     ASSESSMENT AND PLAN 35 y.o. year old male  has a past medical history of Depression, Dyspnea, Multiple sclerosis (HCC) (12/24/13), Multiple sclerosis (HCC), and Vision abnormalities. here with     ICD-10-CM   1. Relapsing remitting multiple sclerosis (HCC)  G35 Ambulatory referral to [facility]  2. High risk medication use  Z79.899   3. Gait disturbance  R26.9   4. Disturbed cognition  R41.89   5. Other fatigue  R53.83   6. Spasticity  R25.2   7. Chronic pain of left knee  M25.562    G89.29     Mr Landess is doing very well on Ocrevus. No new or worsening symptoms. We will continue current treatment regimen. He will  continue to monitor left knee pain. I have called in meloxicam 7.5mg  for antiinflammatory and pain relief. He will take daily with food for two weeks, then as  needed. Referral placed to PCP. Labs from admission reviewed today. He will return in 6 months for follow up. He verbalizes understanding and agreement with this plan.    Orders Placed This Encounter  Procedures  . Ambulatory referral to [facility]    Referral Priority:   Routine    Referral Type:   Consultation    Referral Reason:   Specialty Services Required    Requested Specialty:   Family Medicine    Number of Visits Requested:   1     Meds ordered this encounter  Medications  . meloxicam (MOBIC) 7.5 MG tablet    Sig: Take 1 tablet (7.5 mg total) by mouth daily.    Dispense:  90 tablet    Refill:  1    Order Specific Question:   Supervising Provider    Answer:   Anson Fret [id number]      I spent 30 minutes with the patient. 50% of this time was spent counseling and educating patient on plan of care and medications.     Shawnie Dapper, FNP-C 06/24/2020, 10:36 AM [facility] [address] [place], [place] [address] [phone]

## 2020-06-24 NOTE — Progress Notes (Signed)
I have read the note, and I agree with the clinical assessment and plan.  Boone Gear A. Treena Cosman, MD, PhD, FAAN Certified in Neurology, Clinical Neurophysiology, Sleep Medicine, Pain Medicine and Neuroimaging  [facility] [address], [address] [place], [place] [address] [phone]  

## 2020-06-24 NOTE — Patient Instructions (Addendum)
Below is our plan:  We will continue Ocrevus infusions every 6 months. I will call in meloxicam 7.52m daily for knee pain. Take this with food every day for two weeks then as needed. I have placed a referral to primary care as well. You should hear something back within 2 weeks, let me know if you do not.   Please make sure you are staying well hydrated. I recommend 50-60 ounces daily. Well balanced diet and regular exercise encouraged.    Please continue follow up with care team as directed.   Follow up with Dr SFelecia Shellingin 6 months   You may receive a survey regarding today's visit. I encourage you to leave honest feed back as I do use this information to improve patient care. Thank you for seeing me today!    Chronic Knee Pain, Adult Knee pain that lasts longer than 3 months is called chronic knee pain. You may have pain in one or both knees. Symptoms of chronic knee pain may also include swelling and stiffness. The most common cause is age-related wear and tear (osteoarthritis) of your knee joint. Many conditions can cause chronic knee pain. Treatment depends on the cause. The main treatments are physical therapy and weight loss. It may also be treated with medicines, injections, a knee sleeve or brace, and by using crutches. Rest, ice, pressure (compression), and elevation, also known as RICE therapy, may also be recommended. Follow these instructions at home: If you have a knee sleeve or brace:  Wear the knee sleeve or brace as told by your doctor. Take it off only as told by your doctor.  Loosen it if your toes: ? Tingle. ? Become numb. ? Turn cold and blue.  Keep it clean.  If the sleeve or brace is not waterproof: ? Do not let it get wet. ? Ask your doctor if you may take it off when you take a bath or shower. If not, cover it with a watertight covering.   Managing pain, stiffness, and swelling  If told, put heat on your knee. Do this as often as told by your doctor. Use the  heat source that your doctor recommends, such as a moist heat pack or a heating pad. ? If you have a removable knee sleeve or brace, take it off as told by your doctor. ? Place a towel between your skin and the heat source. ? Leave the heat on for 20-30 minutes. ? Take off the heat if your skin turns bright red. This is very important. If you cannot feel pain, heat, or cold, you have a greater risk of getting burned.  If told, put ice on your knee. To do this: ? If you have a removable knee sleeve or brace, take it off as told by your doctor. ? Put ice in a plastic bag. ? Place a towel between your skin and the bag. ? Leave the ice on for 20 minutes, 2-3 times a day. ? Take off the ice if your skin turns bright red. This is very important. If you cannot feel pain, heat, or cold, you have a greater risk of damage to the area.  Move your toes often.  Raise the injured area above the level of your heart while you are sitting or lying down.      Activity  Avoid activities where both feet leave the ground at the same time (high-impact activities). Examples are running, jumping rope, and doing jumping jacks.  Follow the exercise plan  that your doctor makes for you. Your doctor may suggest that you: ? Avoid activities that make knee pain worse. You may need to change the exercises that you do, the sports that you participate in, or your job duties. ? Wear shoes with cushioned soles. ? Avoid sports that require running and sudden changes in direction. ? Do exercises or physical therapy. This is planned to match your needs and your abilities. ? Do exercises that increase your balance and strength, such as tai chi and yoga.  Do not use your injured knee to support your body weight until your doctor says that you can. Use crutches as told by your doctor.  Return to your normal activities when your doctor says that it is safe. General instructions  Take over-the-counter and prescription  medicines only as told by your doctor.  If you are overweight, work with your doctor and a food expert (dietitian) to set goals to lose weight. Being overweight can make your knee hurt more.  Do not smoke or use any products that contain nicotine or tobacco. If you need help quitting, ask your doctor.  Keep all follow-up visits. Contact a doctor if:  You have knee pain that is not getting better or gets worse.  You are not able to do your exercises due to knee pain. Get help right away if:  Your knee swells and the swelling gets worse.  You cannot move your knee.  You have very bad knee pain. Summary  Knee pain that lasts more than 3 months is called chronic knee pain.  The main treatments for chronic knee pain are physical therapy and weight loss. You may also need to take medicines, wear a knee sleeve or brace, use crutches, and put ice or heat on your knee.  Lose weight if you are overweight. Work with your doctor and a food expert (dietitian) to help you set goals to lose weight. Being overweight can make your knee hurt more.  Follow the exercise plan that your doctor makes for you. This information is not intended to replace advice given to you by your health care provider. Make sure you discuss any questions you have with your health care provider. Document Revised: 10/18/2019 Document Reviewed: 10/18/2019 Elsevier Patient Education  2021 [employer].    Multiple Sclerosis Multiple sclerosis (MS) is a disease of the brain, spinal cord, and optic nerves (central nervous system). It causes the body's disease-fighting (immune) system to destroy the protective covering (myelin sheath) around nerves in the brain. When this happens, signals (nerve impulses) going to and from the brain and spinal cord do not get sent properly or may not get sent at all. There are several types of MS:  Relapsing-remitting MS. This is the most common type. This causes sudden attacks of symptoms.  After an attack, you may recover completely until the next attack, or some symptoms may remain permanently.  Secondary progressive MS. This usually develops after the onset of relapsing-remitting MS. Similar to relapsing-remitting MS, this type also causes sudden attacks of symptoms. Attacks may be less frequent, but symptoms slowly get worse (progress) over time.  Primary progressive MS. This causes symptoms that steadily progress over time. This type of MS does not cause sudden attacks of symptoms. The age of onset of MS varies, but it often develops between 33-54 years of age. MS is a lifelong (chronic) condition. There is no cure, but treatment can help slow down the progression of the disease. What are the causes?  The cause of this condition is not known. What increases the risk? You are more likely to develop this condition if:  You are a woman.  You have a relative with MS. However, the condition is not passed from parent to child (inherited).  You have a lack (deficiency) of vitamin D.  You smoke. MS is more common in the [place] than in the [place]. What are the signs or symptoms? Relapsing-remitting and secondary progressive MS cause symptoms to occur in episodes or attacks that may last weeks to months. There may be long periods between attacks in which there are almost no symptoms. Primary progressive MS causes symptoms to steadily progress after they develop. Symptoms of MS vary because of the many different ways it affects the central nervous system. The main symptoms include:  Vision problems and eye pain.  Numbness and weakness.  Inability to move your arms, hands, feet, or legs (paralysis).  Balance problems.  Shaking that you cannot control (tremors).  Muscle spasms.  Problems with thinking (cognitive changes). MS can also cause symptoms that are associated with the disease, but are not always the direct result of an MS attack. They  may include:  Inability to control urination or bowel movements (incontinence).  Headaches.  Fatigue.  Inability to tolerate heat.  Emotional changes.  Depression.  Pain. How is this diagnosed? This condition is diagnosed based on:  Your symptoms.  A neurological exam. This involves checking central nervous system function, such as nerve function, reflexes, and coordination.  MRIs of the brain and spinal cord.  Lab tests, including a lumbar puncture that tests the fluid that surrounds the brain and spinal cord (cerebrospinal fluid).  Tests to measure the electrical activity of the brain in response to stimulation (evoked potentials). How is this treated? There is no cure for MS, but medicines can help decrease the number and frequency of attacks and help relieve nuisance symptoms. Treatment options may include:  Medicines that reduce the frequency of attacks. These medicines may be given by injection, by mouth (orally), or through an IV.  Medicines that reduce inflammation (steroids). These may provide short-term relief of symptoms.  Medicines to help control pain, depression, fatigue, or incontinence.  Nutritional counseling. Vitamin D supplements, if you have a deficiency.  Using devices to help you move around (assistive devices), such as braces, a cane, or a walker.  Physical therapy to strengthen and stretch your muscles.  Occupational therapy to help you with everyday tasks.  Alternative or complementary treatments such as exercise, massage, or acupuncture.   Follow these instructions at home:  Take over-the-counter and prescription medicines only as told by your health care provider.  Do not drive or use heavy machinery while taking prescription pain medicine.  Use assistive devices as recommended by your physical therapist or your health care provider.  Exercise as directed by your health care provider.  Eating healthy can help manage MS  symptoms.  Return to your normal activities as told by your health care provider. Ask your health care provider what activities are safe for you.  Reach out for support. Share your feelings with friends, family, or a support group.  Keep all follow-up visits as told by your health care provider and therapists. This is important. Where to find more information  National Multiple Sclerosis Society: https://www.nationalmssociety.[employer] [facility] St[employer]: http://hendricks-barton.net/  [employer]: [employer].dk Contact a health care provider if:  You  feel depressed.  You develop new pain or numbness.  You have tremors.  You have problems with sexual function. Get help right away if:  You develop paralysis.  You develop numbness.  You have problems with your bladder or bowel function.  You develop double vision.  You lose vision in one or both eyes.  You develop suicidal thoughts.  You develop severe confusion. If you ever feel like you may hurt yourself or others, or have thoughts about taking your own life, get help right away. You can go to your nearest emergency department or call:  Your local emergency services (911 in the [place]  A suicide crisis helpline, such as the [facility] at 442-792-6085. This is open 24 hours a day. Summary  Multiple sclerosis (MS) is a disease of the central nervous system that causes the body's immune system to destroy the protective covering (myelin sheath) around nerves in the brain.  There are 3 types of MS: relapsing-remitting, secondary progressive, and primary progressive. Relapsing-remitting and secondary progressive MS cause symptoms to occur in episodes or attacks that may last weeks to months. Primary progressive MS causes symptoms to steadily progress after they develop.  There is no cure for MS, but medicines  can help decrease the number and frequency of attacks and help relieve nuisance symptoms. Treatment may also include physical or occupational therapy.  If you develop numbness, paralysis, vision problems, or other neurological symptoms, get help right away. This information is not intended to replace advice given to you by your health care provider. Make sure you discuss any questions you have with your health care provider. Document Revised: 02/13/2020 Document Reviewed: 02/13/2020 Elsevier Patient Education  2021 [employer].

## 2020-06-25 ENCOUNTER — Other Ambulatory Visit: Payer: Self-pay

## 2020-06-25 ENCOUNTER — Ambulatory Visit (HOSPITAL_COMMUNITY)
Admission: RE | Admit: 2020-06-25 | Discharge: 2020-06-25 | Disposition: A | Discharge status: Home / Self Care | Payer: Medicare Other | Source: Ambulatory Visit | Attending: Neurology | Admitting: Neurology

## 2020-06-25 DIAGNOSIS — G35 Multiple sclerosis: Secondary | ICD-10-CM | POA: Insufficient documentation

## 2020-06-25 MED ORDER — SODIUM CHLORIDE 0.9 % IV SOLN
600.0000 mg | INTRAVENOUS | Status: DC
  Administered 2020-06-25: 600 mg via INTRAVENOUS
  Filled 2020-06-25: qty 20

## 2020-06-25 MED ORDER — FAMOTIDINE IN NACL 20-0.9 MG/50ML-% IV SOLN
20.0000 mg | Freq: Once | INTRAVENOUS | Status: AC
  Administered 2020-06-25: 20 mg via INTRAVENOUS

## 2020-06-25 MED ORDER — ACETAMINOPHEN 325 MG PO TABS
650.0000 mg | ORAL_TABLET | Freq: Once | ORAL | Status: AC
  Administered 2020-06-25: 650 mg via ORAL

## 2020-06-25 MED ORDER — METHYLPREDNISOLONE SODIUM SUCC 125 MG IJ SOLR
INTRAMUSCULAR | Status: AC
  Filled 2020-06-25: qty 2

## 2020-06-25 MED ORDER — DIPHENHYDRAMINE HCL 50 MG/ML IJ SOLN: 50.0000 mg | Freq: Once | INTRAMUSCULAR | Status: DC

## 2020-06-25 MED ORDER — ACETAMINOPHEN 325 MG PO TABS
ORAL_TABLET | ORAL | Status: AC
  Filled 2020-06-25: qty 2

## 2020-06-25 MED ORDER — DIPHENHYDRAMINE HCL 25 MG PO CAPS
ORAL_CAPSULE | ORAL | Status: AC
  Administered 2020-06-25: 50 mg
  Filled 2020-06-25: qty 2

## 2020-06-25 MED ORDER — FAMOTIDINE IN NACL 20-0.9 MG/50ML-% IV SOLN
INTRAVENOUS | Status: AC
  Filled 2020-06-25: qty 50

## 2020-06-25 MED ORDER — METHYLPREDNISOLONE SODIUM SUCC 125 MG IJ SOLR
125.0000 mg | Freq: Once | INTRAMUSCULAR | Status: AC
  Administered 2020-06-25: 125 mg via INTRAVENOUS

## 2020-06-25 NOTE — Progress Notes (Signed)
[place] [facility] of infusion clinic faxed and called office for new updated orders. Office stated they would send.

## 2020-06-26 ENCOUNTER — Other Ambulatory Visit: Payer: Self-pay | Admitting: Family Medicine

## 2020-06-26 NOTE — Telephone Encounter (Signed)
Pt request refill amphetamine-dextroamphetamine (ADDERALL XR) 20 MG 24 hr capsule at [facility] [phone]

## 2020-06-27 MED ORDER — AMPHETAMINE-DEXTROAMPHET ER 20 MG PO CP24: 20.0000 mg | ORAL_CAPSULE | Freq: Every day | ORAL | 0 refills | Status: DC

## 2020-07-30 ENCOUNTER — Other Ambulatory Visit: Payer: Self-pay | Admitting: Family Medicine

## 2020-07-30 MED ORDER — AMPHETAMINE-DEXTROAMPHET ER 20 MG PO CP24: 20.0000 mg | ORAL_CAPSULE | Freq: Every day | ORAL | 0 refills | Status: DC

## 2020-07-30 NOTE — Telephone Encounter (Signed)
Pt request refill amphetamine-dextroamphetamine (ADDERALL XR) 20 MG 24 hr capsule at [id number] °

## 2020-08-01 ENCOUNTER — Telehealth: Payer: Self-pay | Admitting: Family Medicine

## 2020-08-01 NOTE — Telephone Encounter (Signed)
Called patient and discussed NP recommendations. Answered his questions to his stated satisfaction. Advised he call before 2 weeks for any concerns, questions at all. Patient verbalized understanding, appreciation.'

## 2020-08-01 NOTE — Telephone Encounter (Signed)
Patient called back, pain intermittent but more so at night. It is a harsh pain, has increased, laying on his back is more comfortable than on his side. Tylenol helps a little. He denies any new numbness or tingling.  Advised will send to NP and call him back. Patient verbalized understanding, appreciation.

## 2020-08-01 NOTE — Telephone Encounter (Signed)
Pt is asking for a call to discuss pain in tailbone for about 2 weeks, pt states pain is worsening.  Please call

## 2020-08-01 NOTE — Telephone Encounter (Signed)
Called patient LVM requesting call back for further information.

## 2020-08-01 NOTE — Telephone Encounter (Signed)
I am reassured that it seems intermittent but need to monitor closely. He can increase meloxicam dose to see if this helps. He has 7.5mg  prescribed daily. If he is tolerating it well I would have him increase to 15mg  daily for two weeks then decrease dose back to 7.5mg . Take with food. If any concerns of dysuria, fever, bowel/bladder incontinence, weakness (especially unilateral weakness) or numbness, he needs to let know immediately.

## 2020-09-03 ENCOUNTER — Other Ambulatory Visit: Payer: Self-pay | Admitting: Family Medicine

## 2020-09-03 MED ORDER — AMPHETAMINE-DEXTROAMPHET ER 20 MG PO CP24: 20.0000 mg | ORAL_CAPSULE | Freq: Every day | ORAL | 0 refills | Status: DC

## 2020-09-03 NOTE — Telephone Encounter (Signed)
Pt request refill amphetamine-dextroamphetamine (ADDERALL XR) 20 MG 24 hr capsule at [facility] [phone]

## 2020-09-03 NOTE — Telephone Encounter (Signed)
[place] controlled drug registry was checked, last filled 07/30/20. Last seen with L[facility] 06/24/20 advised to FU in 6 mo. Has upcoming appt with Sater 12/23/20.Marland Kitchen refill is appropriate on my end, Rx is pending for your approval, please advise.

## 2020-10-22 ENCOUNTER — Other Ambulatory Visit: Payer: Self-pay | Admitting: Family Medicine

## 2020-10-22 MED ORDER — AMPHETAMINE-DEXTROAMPHET ER 20 MG PO CP24: 20.0000 mg | ORAL_CAPSULE | Freq: Every day | ORAL | 0 refills | Status: DC

## 2020-10-22 NOTE — Telephone Encounter (Signed)
Pt is needing a refill on his amphetamine-dextroamphetamine (ADDERALL XR) 20 MG 24 hr capsule sent in to the [facility] on [address]

## 2020-12-23 ENCOUNTER — Ambulatory Visit (INDEPENDENT_AMBULATORY_CARE_PROVIDER_SITE_OTHER): Payer: Medicare Other | Admitting: Neurology

## 2020-12-23 ENCOUNTER — Other Ambulatory Visit: Payer: Self-pay

## 2020-12-23 ENCOUNTER — Other Ambulatory Visit (HOSPITAL_COMMUNITY): Payer: Self-pay | Admitting: *Deleted

## 2020-12-23 ENCOUNTER — Encounter: Payer: Self-pay | Admitting: Neurology

## 2020-12-23 VITALS — BP 115/68 | HR 64 | Ht 66.0 in | Wt 146.0 lb

## 2020-12-23 DIAGNOSIS — Z79899 Other long term (current) drug therapy: Secondary | ICD-10-CM

## 2020-12-23 DIAGNOSIS — R269 Unspecified abnormalities of gait and mobility: Secondary | ICD-10-CM

## 2020-12-23 DIAGNOSIS — R4189 Other symptoms and signs involving cognitive functions and awareness: Secondary | ICD-10-CM

## 2020-12-23 DIAGNOSIS — G35 Multiple sclerosis: Secondary | ICD-10-CM

## 2020-12-23 DIAGNOSIS — D849 Immunodeficiency, unspecified: Secondary | ICD-10-CM | POA: Diagnosis not present

## 2020-12-23 DIAGNOSIS — R5383 Other fatigue: Secondary | ICD-10-CM

## 2020-12-23 MED ORDER — DALFAMPRIDINE ER 10 MG PO TB12: ORAL_TABLET | ORAL | 11 refills | Status: DC

## 2020-12-23 MED ORDER — ETODOLAC 400 MG PO TABS: 400.0000 mg | ORAL_TABLET | Freq: Two times a day (BID) | ORAL | 5 refills | Status: DC

## 2020-12-23 MED ORDER — BACLOFEN 20 MG PO TABS: 20.0000 mg | ORAL_TABLET | Freq: Three times a day (TID) | ORAL | 11 refills | Status: DC

## 2020-12-23 MED ORDER — AMPHETAMINE-DEXTROAMPHET ER 20 MG PO CP24: 20.0000 mg | ORAL_CAPSULE | Freq: Every day | ORAL | 0 refills | Status: DC

## 2020-12-23 NOTE — Progress Notes (Signed)
[facility]  PATIENT: Mike Lowery DOB: 06-07-1985  REFERRING DOCTOR OR PCP:  Dr. Hyman Hopes SOURCE: Patient, family, records in the EMR, MRI images on PACS  _________________________________   HISTORICAL  CHIEF COMPLAINT:  Chief Complaint  Patient presents with   Follow-up    Pt with mom, rm 2 pt states about 1.5 mth ago developed neck pain. He states that its constant pain in neck, sharp like sensation. Mom states she feels his balance and gait has worsened. ? If there is anything to help with discomfort. He is due for adderall as well. On ocrevus- last infusion 06/25/2020 next infusion due 12/24/20 at [facility] day center    HISTORY OF PRESENT ILLNESS:  Mike Lowery,is a 35 y.o. man with relapsing remitting multiple sclerosis.    Update 12/21/2019: He is on Ocrevus and tolerates it well.   His lasnext t infusion ill be 12/24/2020.       He has not had any exacerbation while on it.     CBC was ok.   IgM and IgA were slightly reduced at last visit  His mom notes his gait is worse- gradually.   He has some stumbling but no falls.   He has noted spasticity mostly in his left leg.   Left leg is weaker and feels heavy.   He has pain,  numbness and tingling in the left leg > right.   .He take baclofen 10 mg po  He has mild urinary hesitancy..   No frank incontinence.    Vision is doing well.  He notes mild fatigue that fluctuates a lot.  He avoids going outside in the heat.   He sleeps poorly some nights.    He has mild  depression but no anxiety.   He has attention deficit / mental fog, and it is helped by Adderall.    He had Covid-19 in January 2022 and felt wiped out.  Breathing was fine.     MS HISTORY:  In mid 2015, he had the onset of weakness and numbness in the arms and legs, a little worse on the left.   When symptoms persisted, he presented to [facility] emergency room. MRI was consistent with multiple sclerosis and he was admitted. The MRI of the brain showed several  plaques, mostly in the periventricular white matter. The MRI of the cervical spine showed several large T2 hyperintense foci, with some enhancement. He was given IV Solu-Medrol and his symptoms improved quite a bit. He followed up with Dr. Everlena Cooper and was placed on Plegridy about 8 or 9 months after starting Plegridy, he had another exacerbation and MRI of the brain and cervical spine were performed again 02/21/2015. The MRI of the cervical spine does not show any definite new lesions. However, the MRI of the brain does show several new lesions including for small enhancing foci. He was switched to [facility] November or December 2016.   I have personally reviewed the MRIs of the brain and cervical spine from October 2016. The MRI of the brain shows multiple T2/FLAIR hyperintense foci consistent with MS in the hemispheres and brainstem.   4 of the hemispheric small foci enhanced after gadolinium administration. The MRI of the cervical spine shows multiple hyperintense foci. There was a normal enhancement pattern.   He had a large exacerbation while on Tecfidera with new gait difficulties March 2018.   MRI's show multiple new lesions in the brain with several lesions enhancing including a larger one in the  left periventrivcular white matter.    Also has a new focus adjacent to C4 in the spinal cord.  He was switched to ocrelizumab and had his first infusion at the end of March 2018.     MRI images MRI of the brain 06/04/2020 showed T2/flair hyperintense foci in the brainstem, left cerebellar hemisphere, thalamus and the hemispheres.  None of the foci enhanced.  No changes compared to the 12/28/2019 MRI.  MRI of the cervical spine 06/04/2020 and 07/16/2016 showed multiple T2 hyperintense foci within the spinal cord.  They are somewhat confluent and primarily located posteriorly adjacent to C2, posteriorly and more to the left adjacent to C3-C4, posteriorly more to the right adjacent to C4-C5, posteriorly adjacent to  C5-C6, more to the left adjacent to C6-C7 and posteriorly adjacent to C7-T1, adjacent to T1.  No enhancing lesions.  No change between 2018 and 2022.  MRI of the thoracic spine 06/04/2020 showed multiple T2 hyperintense foci within the spinal cord, no comparison films.  Number the foci appear to be acute.  They do not enhance.  MRI of the brain 12/28/2019 showed multiple T2/FLAIR hyperintense foci in the brainstem, left cerebellar hemisphere, left thalamus and in the periventricular, juxtacortical and deep white matter both hemispheres in a pattern and configuration consistent with chronic demyelinating plaque associated with multiple sclerosis.  None of the foci appear to be acute and they do not enhance.  Compared to the MRI from 01/01/2018, there are no new lesions.  Normal enhancement pattern and no acute findings REVIEW OF SYSTEMS: Constitutional: No fevers, chills, sweats, or change in appetite.   He has fatigue and sleepiness. Eyes: No visual changes, double vision, eye pain Ear, nose and throat: No hearing loss, ear pain, nasal congestion, sore throat Cardiovascular: No chest pain, palpitations Respiratory:  No shortness of breath at rest or with exertion.   No wheezes GastrointestinaI: No nausea, vomiting, diarrhea, abdominal pain, fecal incontinence Genitourinary:  He notes urinary frequency and ED.    Musculoskeletal:  No neck pain, back pain Integumentary: No rash, pruritus, skin lesions Neurological: as above Psychiatric: See above Endocrine: No palpitations, diaphoresis, change in appetite, change in weigh or increased thirst Hematologic/Lymphatic:  No anemia, purpura, petechiae. Allergic/Immunologic: No itchy/runny eyes, nasal congestion, recent allergic reactions, rashes  ALLERGIES: No Known Allergies  HOME MEDICATIONS:  Current Outpatient Medications:    dalfampridine 10 MG TB12, One po q12 hours, Disp: 60 tablet, Rfl: 11   etodolac (LODINE) 400 MG tablet, Take 1 tablet (400  mg total) by mouth 2 (two) times daily., Disp: 60 tablet, Rfl: 5   ibuprofen (ADVIL,MOTRIN) 200 MG tablet, Take 400 mg by mouth every 6 (six) hours as needed for moderate pain. , Disp: , Rfl:    ocrelizumab (OCREVUS) 300 MG/10ML injection, Inject 20 mLs (600 mg total) into the vein every 6 (six) months., Disp: 20 mL, Rfl: 0   amphetamine-dextroamphetamine (ADDERALL XR) 20 MG 24 hr capsule, Take 1 capsule (20 mg total) by mouth daily., Disp: 30 capsule, Rfl: 0   baclofen (LIORESAL) 20 MG tablet, Take 1 tablet (20 mg total) by mouth 3 (three) times daily., Disp: 90 tablet, Rfl: 11  PAST MEDICAL HISTORY: Past Medical History:  Diagnosis Date   Depression    situaltional   Dyspnea    Multiple sclerosis (HCC) 12/24/13   Multiple sclerosis (HCC)    Vision abnormalities     PAST SURGICAL HISTORY: Past Surgical History:  Procedure Laterality Date   NO PAST SURGERIES  FAMILY HISTORY: Family History  Problem Relation Age of Onset   Healthy Mother    Healthy Father    Cancer Maternal Grandmother        unknown    Ataxia Sister    Multiple sclerosis Neg Hx     SOCIAL HISTORY:  Social History   Socioeconomic History   Marital status: Single    Spouse name: Not on file   Number of children: Not on file   Years of education: Not on file   Highest education level: Not on file  Occupational History   Not on file  Tobacco Use   Smoking status: Former    Types: Cigarettes    Quit date: 04/18/2013    Years since quitting: 7.6   Smokeless tobacco: Former    Quit date: 09/08/2014  Substance and Sexual Activity   Alcohol use: Yes   Drug use: No    Types: Marijuana    Comment: former   Sexual activity: Yes    Partners: Female  Other Topics Concern   Not on file  Social History Narrative   Not on file   Social Determinants of Health   Financial Resource Strain: Not on file  Food Insecurity: Not on file  Transportation Needs: Not on file  Physical Activity: Not on file   Stress: Not on file  Social Connections: Not on file  Intimate Partner Violence: Not on file     PHYSICAL EXAM  Vitals:   12/23/20 0914  BP: 115/68  Pulse: 64  Weight: 146 lb (66.2 kg)  Height: 5\' 6"  (1.676 m)    Body mass index is 23.57 kg/m.   General: The patient is well-developed and well-nourished and in no acute distress   Skin: Extremities are without rash or edema.  Neurologic Exam  Mental status: The patient is alert and oriented x 3 at the time of the examination. The patient has apparent normal recent and remote memory, with a reduced attention span and concentration ability.   Speech is normal.  Cranial nerves: Extraocular movements are full.  Colors are desaturated OD relative to OS. Facial strength is normal.  Trapezius strength is normal..   No obvious hearing deficits are noted.  Motor:   Muscle bulk is normal.   He has increased muscle tone in the legs.  Strength is 5/5.  Sensory:  He reports normal and symmetric sensation to touch and vibration in the arms or legs..  Coordination: On cerebellar testing finger-nose-finger is normal.  Heel-to-shin is mildly reduced.  Gait and station: Station is normal.   The gait is wide and spastic (left) after walking 50 feet he had a slight foot drop on the left.  The tandem gait is wide.  Romberg is borderline.   Reflexes: Deep tendon reflexes are symmetric and 3 in arms.  He has increased reflexes in the legs but no ankle clonus.    25 foot timed walk 11.2 seconds (average of 2 trials 11.0, 11.4)     DIAGNOSTIC DATA (LABS, IMAGING, TESTING) - I reviewed patient records, labs, notes, testing and imaging myself where available.  Lab Results  Component Value Date   WBC 6.7 06/06/2020   HGB 14.4 06/06/2020   HCT 43.9 06/06/2020   MCV 88.9 06/06/2020   PLT 306 06/06/2020      Component Value Date/Time   NA 140 06/06/2020 0113   NA 145 (H) 06/22/2019 0850   K 4.4 06/06/2020 0113   CL 104 06/06/2020 0113  CO2 25 06/06/2020 0113   GLUCOSE 136 (H) 06/06/2020 0113   BUN 11 06/06/2020 0113   BUN 9 06/22/2019 0850   CREATININE 1.01 06/06/2020 0113   CREATININE 0.92 03/27/2015 1130   CALCIUM 9.1 06/06/2020 0113   PROT 6.4 (L) 06/06/2020 0113   PROT 6.2 06/22/2019 0850   ALBUMIN 4.0 06/06/2020 0113   ALBUMIN 4.2 06/22/2019 0850   AST 26 06/06/2020 0113   ALT 34 06/06/2020 0113   ALKPHOS 66 06/06/2020 0113   BILITOT 0.4 06/06/2020 0113   BILITOT 0.2 06/22/2019 0850   GFRNONAA >60 06/06/2020 0113   GFRNONAA >89 10/10/2014 1251   GFRAA 103 06/22/2019 0850   GFRAA >89 10/10/2014 1251       ASSESSMENT AND PLAN    1. Multiple sclerosis (HCC)   2. Disturbed cognition   3. High risk medication use   4. Immunosuppression (HCC)   5. Gait disturbance   6. Other fatigue       1.   Continue ocrelizumab for MS. check IgG/IgM and CD19/20.  Check an MRI to determine if there is any subclinical activity.. If this is occurring we would need to consider a different DMT. 2.   Renew Adderall for attention deficit and fatigue 3.   Increase baclofen for spasticity up to 20 mg 3 times daily 4.   Dalfampridine ER 10 mg p.o. twice daily for gait.   Return to see me in 6 months or sooner if there are new or worsening symptoms.  Rital Cavey A. Epimenio Foot, MD, PhD 12/23/2020, 10:11 AM Certified in Neurology, Clinical Neurophysiology, Sleep Medicine, Pain Medicine and Neuroimaging  [facility] [address] [place], [place] [address] [phone]

## 2020-12-24 ENCOUNTER — Encounter (HOSPITAL_COMMUNITY)
Admission: RE | Admit: 2020-12-24 | Discharge: 2020-12-24 | Disposition: A | Discharge status: Home / Self Care | Payer: Medicare Other | Source: Ambulatory Visit | Attending: Neurology | Admitting: Neurology

## 2020-12-24 DIAGNOSIS — G35 Multiple sclerosis: Secondary | ICD-10-CM | POA: Insufficient documentation

## 2020-12-24 LAB — CBC WITH DIFFERENTIAL/PLATELET
Basophils Absolute: 0.1 10*3/uL (ref 0.0–0.2)
Basos: 1 %
EOS (ABSOLUTE): 0.2 10*3/uL (ref 0.0–0.4)
Eos: 5 %
Hematocrit: 44.9 % (ref 37.5–51.0)
Hemoglobin: 15.2 g/dL (ref 13.0–17.7)
Immature Grans (Abs): 0 10*3/uL (ref 0.0–0.1)
Immature Granulocytes: 0 %
Lymphocytes Absolute: 2.3 10*3/uL (ref 0.7–3.1)
Lymphs: 50 %
MCH: 29.4 pg (ref 26.6–33.0)
MCHC: 33.9 g/dL (ref 31.5–35.7)
MCV: 87 fL (ref 79–97)
Monocytes Absolute: 0.4 10*3/uL (ref 0.1–0.9)
Monocytes: 8 %
Neutrophils Absolute: 1.7 10*3/uL (ref 1.4–7.0)
Neutrophils: 36 %
Platelets: 308 10*3/uL (ref 150–450)
RBC: 5.17 x10E6/uL (ref 4.14–5.80)
RDW: 11.7 % (ref 11.6–15.4)
WBC: 4.6 10*3/uL (ref 3.4–10.8)

## 2020-12-24 LAB — COMPREHENSIVE METABOLIC PANEL
ALT: 13 IU/L (ref 0–44)
AST: 16 IU/L (ref 0–40)
Albumin/Globulin Ratio: 2.3 — ABNORMAL HIGH (ref 1.2–2.2)
Albumin: 4.9 g/dL (ref 4.0–5.0)
Alkaline Phosphatase: 86 IU/L (ref 44–121)
BUN/Creatinine Ratio: 12 (ref 9–20)
BUN: 12 mg/dL (ref 6–20)
Bilirubin Total: 0.4 mg/dL (ref 0.0–1.2)
CO2: 23 mmol/L (ref 20–29)
Calcium: 10 mg/dL (ref 8.7–10.2)
Chloride: 103 mmol/L (ref 96–106)
Creatinine, Ser: 1.04 mg/dL (ref 0.76–1.27)
Globulin, Total: 2.1 g/dL (ref 1.5–4.5)
Glucose: 81 mg/dL (ref 65–99)
Potassium: 4.6 mmol/L (ref 3.5–5.2)
Sodium: 143 mmol/L (ref 134–144)
Total Protein: 7 g/dL (ref 6.0–8.5)
eGFR: 97 mL/min/{1.73_m2} (ref >59)

## 2020-12-24 LAB — IGG, IGA, IGM
IgA/Immunoglobulin A, Serum: 84 mg/dL — ABNORMAL LOW (ref 90–386)
IgG (Immunoglobin G), Serum: 851 mg/dL (ref 603–1613)
IgM (Immunoglobulin M), Srm: 22 mg/dL (ref 20–172)

## 2020-12-24 MED ORDER — ACETAMINOPHEN 325 MG PO TABS: 650.0000 mg | ORAL_TABLET | Freq: Once | ORAL | Status: DC

## 2020-12-24 MED ORDER — FAMOTIDINE IN NACL 20-0.9 MG/50ML-% IV SOLN
20.0000 mg | Freq: Once | INTRAVENOUS | Status: AC
  Administered 2020-12-24: 20 mg via INTRAVENOUS

## 2020-12-24 MED ORDER — DIPHENHYDRAMINE HCL 25 MG PO CAPS: 50.0000 mg | ORAL_CAPSULE | Freq: Once | ORAL | Status: DC

## 2020-12-24 MED ORDER — METHYLPREDNISOLONE SODIUM SUCC 125 MG IJ SOLR: 125.0000 mg | Freq: Once | INTRAMUSCULAR | Status: DC

## 2020-12-24 MED ORDER — METHYLPREDNISOLONE SODIUM SUCC 125 MG IJ SOLR: 100.0000 mg | Freq: Once | INTRAMUSCULAR | Status: DC

## 2020-12-24 MED ORDER — FAMOTIDINE IN NACL 20-0.9 MG/50ML-% IV SOLN
INTRAVENOUS | Status: AC
  Filled 2020-12-24: qty 50

## 2020-12-24 MED ORDER — ACETAMINOPHEN 325 MG PO TABS
650.0000 mg | ORAL_TABLET | Freq: Once | ORAL | Status: AC
  Administered 2020-12-24: 650 mg via ORAL

## 2020-12-24 MED ORDER — SODIUM CHLORIDE 0.9 % IV SOLN
600.0000 mg | Freq: Once | INTRAVENOUS | Status: AC
  Administered 2020-12-24: 600 mg via INTRAVENOUS
  Filled 2020-12-24: qty 20

## 2020-12-24 MED ORDER — DIPHENHYDRAMINE HCL 25 MG PO CAPS
50.0000 mg | ORAL_CAPSULE | Freq: Once | ORAL | Status: AC
  Administered 2020-12-24: 50 mg via ORAL

## 2020-12-24 MED ORDER — SODIUM CHLORIDE 0.9 % IV SOLN: 600.0000 mg | Freq: Once | INTRAVENOUS | Status: DC

## 2020-12-24 MED ORDER — METHYLPREDNISOLONE SODIUM SUCC 125 MG IJ SOLR
INTRAMUSCULAR | Status: AC
  Administered 2020-12-24: 125 mg
  Filled 2020-12-24: qty 2

## 2020-12-24 MED ORDER — DIPHENHYDRAMINE HCL 25 MG PO CAPS
ORAL_CAPSULE | ORAL | Status: AC
  Filled 2020-12-24: qty 2

## 2020-12-24 MED ORDER — FAMOTIDINE IN NACL 20-0.9 MG/50ML-% IV SOLN: 20.0000 mg | Freq: Once | INTRAVENOUS | Status: DC

## 2020-12-24 MED ORDER — ACETAMINOPHEN 325 MG PO TABS
ORAL_TABLET | ORAL | Status: AC
  Filled 2020-12-24: qty 2

## 2021-01-12 ENCOUNTER — Other Ambulatory Visit: Payer: Self-pay | Admitting: Neurology

## 2021-02-04 ENCOUNTER — Ambulatory Visit (HOSPITAL_COMMUNITY): Payer: Medicare Other

## 2021-02-06 ENCOUNTER — Other Ambulatory Visit: Payer: Self-pay | Admitting: Neurology

## 2021-02-06 ENCOUNTER — Telehealth: Payer: Self-pay | Admitting: Neurology

## 2021-02-06 MED ORDER — AMPHETAMINE-DEXTROAMPHET ER 20 MG PO CP24: 20.0000 mg | ORAL_CAPSULE | Freq: Every day | ORAL | 0 refills | Status: DC

## 2021-02-06 NOTE — Telephone Encounter (Signed)
Pt is needing a refill request for his amphetamine-dextroamphetamine (ADDERALL XR) 20 MG 24 hr capsule sent in to the [facility] on [address]

## 2021-02-06 NOTE — Telephone Encounter (Signed)
Pt is needing a refill request on his amphetamine-dextroamphetamine (ADDERALL XR) 20 MG 24 hr capsule sent to the [facility] on [address]

## 2021-04-23 ENCOUNTER — Other Ambulatory Visit: Payer: Self-pay | Admitting: Neurology

## 2021-04-23 MED ORDER — AMPHETAMINE-DEXTROAMPHET ER 20 MG PO CP24
20.0000 mg | ORAL_CAPSULE | Freq: Every day | ORAL | 0 refills | Status: DC
Start: 2021-04-23 — End: 2021-06-10

## 2021-04-23 NOTE — Telephone Encounter (Signed)
Pt requesting refill for amphetamine-dextroamphetamine (ADDERALL XR) 20 MG 24 hr capsule. Pharmacy [facility] [phone] - [place], [place] - [address]

## 2021-06-10 ENCOUNTER — Other Ambulatory Visit: Payer: Self-pay | Admitting: Neurology

## 2021-06-10 MED ORDER — AMPHETAMINE-DEXTROAMPHET ER 20 MG PO CP24: 20.0000 mg | ORAL_CAPSULE | Freq: Every day | ORAL | 0 refills | Status: DC

## 2021-06-10 NOTE — Telephone Encounter (Signed)
Pt is requesting a refill for amphetamine-dextroamphetamine (ADDERALL XR) 20 MG 24 hr capsule.  Pharmacy: [facility] [phone]

## 2021-06-23 ENCOUNTER — Other Ambulatory Visit (HOSPITAL_COMMUNITY): Payer: Self-pay | Admitting: *Deleted

## 2021-06-24 ENCOUNTER — Inpatient Hospital Stay (HOSPITAL_COMMUNITY): Admission: RE | Admit: 2021-06-24 | Payer: Medicare Other | Source: Ambulatory Visit

## 2021-06-25 NOTE — Patient Instructions (Addendum)
Below is our plan:  We will continue Ocrevus infusions every 6 months. Continue the Baclofen 20 mg three times a day, restart the Dalfampridine 10 mg, start the Etodolac 400 mg twice a day. Lets do this along with physical therapy in hopes that this will help with pain and spasticity overall.  We encourage you to update your eye exam.  We discussed getting you started with psychologist to initiate some therapy for depression.   Please make sure you are staying well hydrated. I recommend 50-60 ounces daily. Well balanced diet and regular exercise encouraged. Consistent sleep schedule with 6-8 hours recommended.   Please continue follow up with care team as directed.   Follow up with Dr. Epimenio Foot in 6 months  You may receive a survey regarding today's visit. I encourage you to leave honest feed back as I do use this information to improve patient care. Thank you for seeing me today!

## 2021-06-25 NOTE — Progress Notes (Signed)
PATIENT: Mike Lowery DOB: 1986-04-21  REASON FOR VISIT: follow up HISTORY FROM: patient  Chief Complaint  Patient presents with   Follow-up    Pt alone, rm 2. Having difficulty with being able to sleep. When he lays down he states certain positions it feels like his top half of body is locking up. He states this has becoming more frequent and has lasted as long as 60 sec at a time. Doesn't feel like baclofen helps when this happens.  Pt is on ocrevus- last visit in aug. Next infusion due 07/03/2021 at [facility]. He describes having aches in tail bone area.    Other    He stopped taking the dalfampridine. He took it for 2.5 months consistently. But then stopped because didn't felt like he was seeing improvement.       HISTORY OF PRESENT ILLNESS: 06/30/21 ALL: He returns for RRMS follow up. He continues to tolerate Ocrevus infusions, last in 12/2019.Next infusion due 07/03/2021 with short stay. MRIs 05/2020 stable. Labs have been stable.   He reports that he's not doing well and feels his spasticity is worse. He states that his pain is bad too, he describes it as an aching pain all over. Dalfampridine added at last visit with Dr Epimenio Foot, 12/2020. He states he took it for 2.5 months but stopped because he felt it wasn't helping. He also endorses that it has been a while since he's seen physical therapy. He did not get etodolac previously called in by Dr Epimenio Foot. Baclofen was increased to 20mg  TID during his last visit with Sater. He reports he takes all three tablets at bedtime.   He has been having difficulty sleeping. He reports falling asleep has been an issue and he states his mind just keeps going. He also reports that when he lays down his left side lock up almost daily. He has a previous history of depression. He would like to see psychology. He denies thoughts of hurting himself or others. He continues Adderall that helps with attention and fatigue. May skip dose on weekends or  when not working.   He endorses that his vision has been noticeably more blurry and he has to wear his glasses more frequently. He reports it's been a while since he's seen his eye doctor.   No changes in bowel or bladder habits.    12/20/2020 RS:  Mike Lowery,is a 36 y.o. man with relapsing remitting multiple sclerosis.     Update 12/21/2019: He is on Ocrevus and tolerates it well.   His lasnext t infusion ill be 12/24/2020.       He has not had any exacerbation while on it.     CBC was ok.   IgM and IgA were slightly reduced at last visit   His mom notes his gait is worse- gradually.   He has some stumbling but no falls.   He has noted spasticity mostly in his left leg.   Left leg is weaker and feels heavy.   He has pain,  numbness and tingling in the left leg > right.   .He take baclofen 10 mg po  He has mild urinary hesitancy..   No frank incontinence.    Vision is doing well.   He notes mild fatigue that fluctuates a lot.  He avoids going outside in the heat.   He sleeps poorly some nights.    He has mild  depression but no anxiety.   He has attention  deficit / mental fog, and it is helped by Adderall.     He had Covid-19 in January 2022 and felt wiped out.  Breathing was fine.      MS HISTORY:  In mid 2015, he had the onset of weakness and numbness in the arms and legs, a little worse on the left.   When symptoms persisted, he presented to [facility] emergency room. MRI was consistent with multiple sclerosis and he was admitted. The MRI of the brain showed several plaques, mostly in the periventricular white matter. The MRI of the cervical spine showed several large T2 hyperintense foci, with some enhancement. He was given IV Solu-Medrol and his symptoms improved quite a bit. He followed up with Dr. Everlena Cooper and was placed on Plegridy about 8 or 9 months after starting Plegridy, he had another exacerbation and MRI of the brain and cervical spine were performed again 02/21/2015. The MRI of the  cervical spine does not show any definite new lesions. However, the MRI of the brain does show several new lesions including for small enhancing foci. He was switched to [facility] November or December 2016.   I have personally reviewed the MRIs of the brain and cervical spine from October 2016. The MRI of the brain shows multiple T2/FLAIR hyperintense foci consistent with MS in the hemispheres and brainstem.   4 of the hemispheric small foci enhanced after gadolinium administration. The MRI of the cervical spine shows multiple hyperintense foci. There was a normal enhancement pattern.   He had a large exacerbation while on Tecfidera with new gait difficulties March 2018.   MRI's show multiple new lesions in the brain with several lesions enhancing including a larger one in the left periventrivcular white matter.    Also has a new focus adjacent to C4 in the spinal cord.  He was switched to ocrelizumab and had his first infusion at the end of March 2018.      MRI images MRI of the brain 06/04/2020 showed T2/flair hyperintense foci in the brainstem, left cerebellar hemisphere, thalamus and the hemispheres.  None of the foci enhanced.  No changes compared to the 12/28/2019 MRI.   MRI of the cervical spine 06/04/2020 and 07/16/2016 showed multiple T2 hyperintense foci within the spinal cord.  They are somewhat confluent and primarily located posteriorly adjacent to C2, posteriorly and more to the left adjacent to C3-C4, posteriorly more to the right adjacent to C4-C5, posteriorly adjacent to C5-C6, more to the left adjacent to C6-C7 and posteriorly adjacent to C7-T1, adjacent to T1.  No enhancing lesions.  No change between 2018 and 2022.   MRI of the thoracic spine 06/04/2020 showed multiple T2 hyperintense foci within the spinal cord, no comparison films.  Number the foci appear to be acute.  They do not enhance.   MRI of the brain 12/28/2019 showed multiple T2/FLAIR hyperintense foci in the brainstem, left  cerebellar hemisphere, left thalamus and in the periventricular, juxtacortical and deep white matter both hemispheres in a pattern and configuration consistent with chronic demyelinating plaque associated with multiple sclerosis.  None of the foci appear to be acute and they do not enhance.  Compared to the MRI from 01/01/2018, there are no new lesions.  Normal enhancement pattern and no acute findings   REVIEW OF SYSTEMS: Out of a complete 14 system review of symptoms, the patient complains only of the following symptoms,left side spacticity, pain, fatigue, insomnia, depression, and all other reviewed systems are negative.   ALLERGIES: No  Known Allergies  HOME MEDICATIONS: Outpatient Medications Prior to Visit  Medication Sig Dispense Refill   amphetamine-dextroamphetamine (ADDERALL XR) 20 MG 24 hr capsule Take 1 capsule (20 mg total) by mouth daily. 30 capsule 0   baclofen (LIORESAL) 20 MG tablet Take 1 tablet (20 mg total) by mouth 3 (three) times daily. 90 tablet 11   etodolac (LODINE) 400 MG tablet Take 1 tablet (400 mg total) by mouth 2 (two) times daily. 60 tablet 5   ibuprofen (ADVIL,MOTRIN) 200 MG tablet Take 400 mg by mouth every 6 (six) hours as needed for moderate pain.      ocrelizumab (OCREVUS) 300 MG/10ML injection Inject 20 mLs (600 mg total) into the vein every 6 (six) months. 20 mL 0   dalfampridine 10 MG TB12 One po q12 hours (Patient not taking: Reported on 06/30/2021) 60 tablet 11   No facility-administered medications prior to visit.    PAST MEDICAL HISTORY: Past Medical History:  Diagnosis Date   Depression    situaltional   Dyspnea    Multiple sclerosis (HCC) 12/24/13   Multiple sclerosis (HCC)    Vision abnormalities     PAST SURGICAL HISTORY: Past Surgical History:  Procedure Laterality Date   NO PAST SURGERIES      FAMILY HISTORY: Family History  Problem Relation Age of Onset   Healthy Mother    Healthy Father    Cancer Maternal Grandmother         unknown    Ataxia Sister    Multiple sclerosis Neg Hx     SOCIAL HISTORY: Social History   Socioeconomic History   Marital status: Single    Spouse name: Not on file   Number of children: Not on file   Years of education: Not on file   Highest education level: Not on file  Occupational History   Not on file  Tobacco Use   Smoking status: Former    Types: Cigarettes    Quit date: 04/18/2013    Years since quitting: 8.2   Smokeless tobacco: Former    Quit date: 09/08/2014  Substance and Sexual Activity   Alcohol use: Yes   Drug use: No    Types: Marijuana    Comment: former   Sexual activity: Yes    Partners: Female  Other Topics Concern   Not on file  Social History Narrative   Not on file   Social Determinants of Health   Financial Resource Strain: Not on file  Food Insecurity: Not on file  Transportation Needs: Not on file  Physical Activity: Not on file  Stress: Not on file  Social Connections: Not on file  Intimate Partner Violence: Not on file      PHYSICAL EXAM  Vitals:   06/30/21 0847  BP: 125/71  Pulse: 61  Weight: 64.4 kg  Height: 5\' 6"  (1.676 m)    Body mass index is 22.92 kg/m.  Generalized: Well developed, in no acute distress  Cardiology: normal rate and rhythm, no murmur noted Respiratory: clear to auscultation bilaterally  Neurological examination  Mentation: Alert oriented to time, place, history taking. Follows all commands speech and language fluent Cranial nerve II-XII: Pupils were equal round reactive to light. Extraocular movements were full, visual field were full on confrontational test. Facial sensation and strength were normal. Head turning and shoulder shrug  were normal and symmetric. Motor: The motor testing reveals 5/5 in BUE and 4+/5 in the LLE and 5/5 in RLE. Good symmetric motor tone is noted  throughout. ROM intact but pain reported with full extension, No obvious edema noted, no pain with palpation of knee joint.   Sensory: Sensory testing is intact to soft touch on all 4 extremities. No evidence of extinction is noted.  Coordination: Cerebellar testing reveals good finger-nose-finger and heel-to-shin bilaterally.  Gait and station: Gait is spastic but stable without assistive device, slight left limp, tandem not attempted today.   DIAGNOSTIC DATA (LABS, IMAGING, TESTING) - I reviewed patient records, labs, notes, testing and imaging myself where available.  No flowsheet data found.   Lab Results  Component Value Date   WBC 4.6 12/23/2020   HGB 15.2 12/23/2020   HCT 44.9 12/23/2020   MCV 87 12/23/2020   PLT 308 12/23/2020      Component Value Date/Time   NA 143 12/23/2020 1019   K 4.6 12/23/2020 1019   CL 103 12/23/2020 1019   CO2 23 12/23/2020 1019   GLUCOSE 81 12/23/2020 1019   GLUCOSE 136 (H) 06/06/2020 0113   BUN 12 12/23/2020 1019   CREATININE 1.04 12/23/2020 1019   CREATININE 0.92 03/27/2015 1130   CALCIUM 10.0 12/23/2020 1019   PROT 7.0 12/23/2020 1019   ALBUMIN 4.9 12/23/2020 1019   AST 16 12/23/2020 1019   ALT 13 12/23/2020 1019   ALKPHOS 86 12/23/2020 1019   BILITOT 0.4 12/23/2020 1019   GFRNONAA >60 06/06/2020 0113   GFRNONAA >89 10/10/2014 1251   GFRAA 103 06/22/2019 0850   GFRAA >89 10/10/2014 1251   No results found for: CHOL, HDL, LDLCALC, LDLDIRECT, TRIG, CHOLHDL Lab Results  Component Value Date   HGBA1C 4.9 06/05/2020   No results found for: VITAMINB12 No results found for: TSH     ASSESSMENT AND PLAN 36 y.o. year old male  has a past medical history of Depression, Dyspnea, Multiple sclerosis (HCC) (12/24/13), Multiple sclerosis (HCC), and Vision abnormalities. here with     ICD-10-CM   1. Relapsing remitting multiple sclerosis (HCC)  G35 Ambulatory referral to Physical Therapy    IgG, IgA, IgM    CBC with Differential/Platelets    2. High risk medication use  Z79.899 IgG, IgA, IgM    CBC with Differential/Platelets    3. Gait disturbance  R26.9  Ambulatory referral to Physical Therapy    4. Spasticity  R25.2 Ambulatory referral to Physical Therapy    5. Disturbed cognition  R41.89 Ambulatory referral to Psychology    6. Other fatigue  R53.83     7. Depression, unspecified depression type  F32.A        Mr Zieger is doing well on Ocrevus. His spasticity has remained and he continues to have gait instability and pain. We will continue with Ocrevus every 6 months, continue the Baclofen 20 mg three times a day, restart the Dalfampridine 10 mg BID, and start the Etodolac 400 mg BID. Continue Adderall ER  daily. PDMP appropriate. Last filled 02/06/2021, refill sent 06/10/2021. Referral placed to PT and psychology. I am hopeful that increased activity will help with gait and mood. May consider SSRI in the future if needed. Labs updated today. We will consider a follow up MRI on his next visit. No new or exacerbating symptoms, today. Encouraged to follow up with ophthalmology for eye exam. He will return in 6 months for follow up. He verbalizes understanding and agreement with this plan.    Orders Placed This Encounter  Procedures   IgG, IgA, IgM   CBC with Differential/Platelets   Ambulatory referral to Physical Therapy  Referral Priority:   Routine    Referral Type:   Physical Medicine    Referral Reason:   Specialty Services Required    Requested Specialty:   Physical Therapy    Number of Visits Requested:   1   Ambulatory referral to Psychology    Referral Priority:   Routine    Referral Type:   Psychiatric    Referral Reason:   Specialty Services Required    Requested Specialty:   Psychology    Number of Visits Requested:   1     No orders of the defined types were placed in this encounter.      Shawnie Dapper, FNP-C 06/30/2021, 10:15 AM [facility] [address], [address] [place], [place] [address] [phone]

## 2021-06-30 ENCOUNTER — Other Ambulatory Visit: Payer: Self-pay

## 2021-06-30 ENCOUNTER — Inpatient Hospital Stay (HOSPITAL_BASED_OUTPATIENT_CLINIC_OR_DEPARTMENT_OTHER)
Admission: EM | Admit: 2021-06-30 | Discharge: 2021-07-02 | DRG: 179 | Disposition: A | Discharge status: Home / Self Care | Payer: Medicare Other | Attending: Family Medicine | Admitting: Family Medicine

## 2021-06-30 ENCOUNTER — Encounter (HOSPITAL_BASED_OUTPATIENT_CLINIC_OR_DEPARTMENT_OTHER): Payer: Self-pay | Admitting: Pediatrics

## 2021-06-30 ENCOUNTER — Emergency Department (HOSPITAL_BASED_OUTPATIENT_CLINIC_OR_DEPARTMENT_OTHER): Payer: Medicare Other

## 2021-06-30 ENCOUNTER — Ambulatory Visit (INDEPENDENT_AMBULATORY_CARE_PROVIDER_SITE_OTHER): Payer: Medicare Other | Admitting: Family Medicine

## 2021-06-30 ENCOUNTER — Encounter: Payer: Self-pay | Admitting: Family Medicine

## 2021-06-30 ENCOUNTER — Telehealth: Payer: Self-pay | Admitting: Family Medicine

## 2021-06-30 VITALS — BP 125/71 | HR 61 | Ht 66.0 in | Wt 142.0 lb

## 2021-06-30 DIAGNOSIS — Z87891 Personal history of nicotine dependence: Secondary | ICD-10-CM

## 2021-06-30 DIAGNOSIS — U071 COVID-19: Secondary | ICD-10-CM | POA: Diagnosis not present

## 2021-06-30 DIAGNOSIS — Z79899 Other long term (current) drug therapy: Secondary | ICD-10-CM

## 2021-06-30 DIAGNOSIS — Z28310 Unvaccinated for covid-19: Secondary | ICD-10-CM

## 2021-06-30 DIAGNOSIS — G35D Multiple sclerosis, unspecified: Secondary | ICD-10-CM | POA: Diagnosis present

## 2021-06-30 DIAGNOSIS — R252 Cramp and spasm: Secondary | ICD-10-CM

## 2021-06-30 DIAGNOSIS — G35 Multiple sclerosis: Secondary | ICD-10-CM

## 2021-06-30 DIAGNOSIS — F32A Depression, unspecified: Secondary | ICD-10-CM | POA: Diagnosis present

## 2021-06-30 DIAGNOSIS — R269 Unspecified abnormalities of gait and mobility: Secondary | ICD-10-CM

## 2021-06-30 DIAGNOSIS — Z8616 Personal history of COVID-19: Secondary | ICD-10-CM

## 2021-06-30 DIAGNOSIS — R4189 Other symptoms and signs involving cognitive functions and awareness: Secondary | ICD-10-CM

## 2021-06-30 DIAGNOSIS — R5383 Other fatigue: Secondary | ICD-10-CM

## 2021-06-30 LAB — CBC WITH DIFFERENTIAL/PLATELET
Abs Immature Granulocytes: 0.01 10*3/uL (ref 0.00–0.07)
Basophils Absolute: 0 10*3/uL (ref 0.0–0.1)
Basophils Relative: 1 %
Eosinophils Absolute: 0 10*3/uL (ref 0.0–0.5)
Eosinophils Relative: 1 %
HCT: 41.6 % (ref 39.0–52.0)
Hemoglobin: 13.6 g/dL (ref 13.0–17.0)
Immature Granulocytes: 0 %
Lymphocytes Relative: 5 %
Lymphs Abs: 0.2 10*3/uL — ABNORMAL LOW (ref 0.7–4.0)
MCH: 28.6 pg (ref 26.0–34.0)
MCHC: 32.7 g/dL (ref 30.0–36.0)
MCV: 87.4 fL (ref 80.0–100.0)
Monocytes Absolute: 0.7 10*3/uL (ref 0.1–1.0)
Monocytes Relative: 22 %
Neutro Abs: 2.3 10*3/uL (ref 1.7–7.7)
Neutrophils Relative %: 71 %
Platelets: 241 10*3/uL (ref 150–400)
RBC: 4.76 MIL/uL (ref 4.22–5.81)
RDW: 12.4 % (ref 11.5–15.5)
WBC: 3.2 10*3/uL — ABNORMAL LOW (ref 4.0–10.5)
nRBC: 0 % (ref 0.0–0.2)

## 2021-06-30 LAB — COMPREHENSIVE METABOLIC PANEL
ALT: 36 U/L (ref 0–44)
AST: 21 U/L (ref 15–41)
Albumin: 4.7 g/dL (ref 3.5–5.0)
Alkaline Phosphatase: 56 U/L (ref 38–126)
Anion gap: 8 (ref 5–15)
BUN: 10 mg/dL (ref 6–20)
CO2: 27 mmol/L (ref 22–32)
Calcium: 8.9 mg/dL (ref 8.9–10.3)
Chloride: 105 mmol/L (ref 98–111)
Creatinine, Ser: 1.03 mg/dL (ref 0.61–1.24)
GFR, Estimated: >60 mL/min (ref >60)
Glucose, Bld: 83 mg/dL (ref 70–99)
Potassium: 3.9 mmol/L (ref 3.5–5.1)
Sodium: 140 mmol/L (ref 135–145)
Total Bilirubin: 0.4 mg/dL (ref 0.3–1.2)
Total Protein: 6.7 g/dL (ref 6.5–8.1)

## 2021-06-30 LAB — RESP PANEL BY RT-PCR (FLU A&B, COVID) ARPGX2
Influenza A by PCR: NEGATIVE
Influenza B by PCR: NEGATIVE
SARS Coronavirus 2 by RT PCR: POSITIVE — AB

## 2021-06-30 MED ORDER — ETODOLAC 400 MG PO TABS: 400.0000 mg | ORAL_TABLET | Freq: Two times a day (BID) | ORAL | 5 refills | Status: DC

## 2021-06-30 MED ORDER — ACETAMINOPHEN 325 MG PO TABS
650.0000 mg | ORAL_TABLET | Freq: Once | ORAL | Status: AC | PRN
  Administered 2021-07-01: 650 mg via ORAL
  Filled 2021-06-30: qty 2

## 2021-06-30 MED ORDER — SODIUM CHLORIDE 0.9 % IV BOLUS
1000.0000 mL | Freq: Once | INTRAVENOUS | Status: AC
  Administered 2021-06-30: 1000 mL via INTRAVENOUS

## 2021-06-30 MED ORDER — ACETAMINOPHEN 325 MG PO TABS: 650.0000 mg | ORAL_TABLET | Freq: Once | ORAL | Status: DC

## 2021-06-30 MED ORDER — DALFAMPRIDINE ER 10 MG PO TB12: ORAL_TABLET | ORAL | 5 refills | Status: DC

## 2021-06-30 MED ORDER — NIRMATRELVIR/RITONAVIR (PAXLOVID)TABLET: 3.0000 | ORAL_TABLET | Freq: Two times a day (BID) | ORAL | Status: DC

## 2021-06-30 NOTE — Telephone Encounter (Signed)
Referral sent to [facility] [phone]. °

## 2021-06-30 NOTE — ED Provider Notes (Signed)
[facility] EMERGENCY DEPT Provider Note   CSN: [id number] Arrival date & time: 06/30/21  2021     History  Chief Complaint  Patient presents with   Cough   Fever    Mike Lowery is a 36 y.o. male.  The history is provided by the patient.  Cough Cough characteristics:  Non-productive Sputum characteristics:  Nondescript Severity:  Mild Onset quality:  Gradual Duration:  2 hours Timing:  Intermittent Progression:  Waxing and waning Chronicity:  New Context comment:  Fever this afternoon, hx of MS, weakness chronically in legs and numbness, slighlty worse when fever started. Saw nuerologist toay for routine followup, restarted on meds. No SOB Relieved by:  Nothing Associated symptoms: chills and fever   Associated symptoms: no chest pain, no diaphoresis, no ear fullness, no ear pain, no eye discharge, no headaches, no myalgias, no rash, no rhinorrhea, no shortness of breath, no sinus congestion, no sore throat, no weight loss and no wheezing   Fever Associated symptoms: chills and cough   Associated symptoms: no chest pain, no ear pain, no headaches, no myalgias, no rash, no rhinorrhea and no sore throat       Home Medications Prior to Admission medications   Medication Sig Start Date End Date Taking? Authorizing Provider  amphetamine-dextroamphetamine (ADDERALL XR) 20 MG 24 hr capsule Take 1 capsule (20 mg total) by mouth daily. 06/10/21   Sater, Nanine Means, MD  baclofen (LIORESAL) 20 MG tablet Take 1 tablet (20 mg total) by mouth 3 (three) times daily. 12/23/20   Sater, Nanine Means, MD  dalfampridine 10 MG TB12 One po q12 hours 06/30/21   Lomax, Amy, NP  etodolac (LODINE) 400 MG tablet Take 1 tablet (400 mg total) by mouth 2 (two) times daily. 06/30/21   Lomax, Amy, NP  ibuprofen (ADVIL,MOTRIN) 200 MG tablet Take 400 mg by mouth every 6 (six) hours as needed for moderate pain.     [provider]  ocrelizumab (OCREVUS) 300 MG/10ML injection Inject 20  mLs (600 mg total) into the vein every 6 (six) months. 04/18/18   Sater, Nanine Means, MD      Allergies    Patient has no known allergies.    Review of Systems   Review of Systems  Constitutional:  Positive for chills and fever. Negative for diaphoresis and weight loss.  HENT:  Negative for ear pain, rhinorrhea and sore throat.   Eyes:  Negative for discharge.  Respiratory:  Positive for cough. Negative for shortness of breath and wheezing.   Cardiovascular:  Negative for chest pain.  Musculoskeletal:  Negative for myalgias.  Skin:  Negative for rash.  Neurological:  Negative for headaches.   Physical Exam Updated Vital Signs BP 130/73    Pulse 75    Temp 100 F (37.8 C)    Resp 17    Ht 5\' 6"  (1.676 m)    Wt 64.4 kg    SpO2 100%    BMI 22.92 kg/m  Physical Exam Vitals and nursing note reviewed.  Constitutional:      General: He is not in acute distress.    Appearance: He is well-developed. He is not ill-appearing.  HENT:     Head: Normocephalic and atraumatic.     Nose: Nose normal.     Mouth/Throat:     Mouth: Mucous membranes are moist.  Eyes:     Extraocular Movements: Extraocular movements intact.     Conjunctiva/sclera: Conjunctivae normal.     Pupils: Pupils  are equal, round, and reactive to light.  Cardiovascular:     Rate and Rhythm: Normal rate and regular rhythm.     Pulses: Normal pulses.     Heart sounds: Normal heart sounds. No murmur heard. Pulmonary:     Effort: Pulmonary effort is normal. No respiratory distress.     Breath sounds: Normal breath sounds.  Abdominal:     Palpations: Abdomen is soft.     Tenderness: There is no abdominal tenderness.  Musculoskeletal:        General: No swelling.     Cervical back: Normal range of motion and neck supple.  Skin:    General: Skin is warm and dry.     Capillary Refill: Capillary refill takes less than 2 seconds.  Neurological:     General: No focal deficit present.     Mental Status: He is alert.      Cranial Nerves: No cranial nerve deficit.     Sensory: Sensory deficit present.     Motor: Weakness present.     Comments: Normal strength throughout except for 1 out of 5 plus strength in the left lower extremity, numbness to both hands but otherwise sensations intact, no visual field defect  Psychiatric:        Mood and Affect: Mood normal.    ED Results / Procedures / Treatments   Labs (all labs ordered are listed, but only abnormal results are displayed) Labs Reviewed  RESP PANEL BY RT-PCR (FLU A&B, COVID) [id number] - Abnormal; Notable for the following components:      Result Value   SARS Coronavirus 2 by RT PCR POSITIVE (*)    All other components within normal limits  CBC WITH DIFFERENTIAL/PLATELET  COMPREHENSIVE METABOLIC PANEL    EKG None  Radiology No results found.  Procedures Procedures    Medications Ordered in ED Medications  acetaminophen (TYLENOL) tablet 650 mg (has no administration in time range)  nirmatrelvir/ritonavir EUA (PAXLOVID) 3 tablet (0 tablets Oral Hold 06/30/21 2222)  sodium chloride 0.9 % bolus 1,000 mL (1,000 mLs Intravenous New Bag/Given 06/30/21 2210)    ED Course/ Medical Decision Making/ A&P                           Medical Decision Making Amount and/or Complexity of Data Reviewed Labs: ordered. Radiology: ordered.  Risk OTC drugs. Decision regarding hospitalization.   Mike Lowery is here with cough, fever, left lower leg weakness, bilateral hand numbness/concern for MS flare.  Actually saw his neurologist today for basic outpatient follow-up.  But later in the day developed fever and weakness got worse.  This happened last time he had COVID.  He is not vaccinated.  He is unable to walk due to extreme weakness in the left lower extremity which happens when he gets sick or MS flares.  He is having numbness and tingling in his hands bilaterally.  Patient with congestion, cough, fever.  Vital signs show low-grade fever but otherwise  normal vitals.  No major respiratory symptoms.  He has inability to lift his left leg off the bed some numbness in his hands otherwise neurologically intact.  Concern for MS flare in the setting of infectious process.  COVID test ordered including chest x-ray and basic labs.  Will give fluid bolus.  Review and interpretation of his labs are that patient is positive for COVID.  We will start him on Paxlovid.  We will talk with neurology about  possible steroids for MS flare in the setting of viral illness.  Will admit for further care.  Talked with Dr. Rosette Reveal with neurology.  He recommends treating COVID symptoms first given that his MS symptoms are similar to when he gets illnesses.  If he is not improving then may need to consider MRI to see if there are any active signs of MS and may need steroids at that time.  Will admit to medicine service for further care.  This chart was dictated using voice recognition software.  Despite best efforts to proofread,  errors can occur which can change the documentation meaning.         Final Clinical Impression(s) / ED Diagnoses Final diagnoses:  COVID-19  Multiple sclerosis [facility])    Rx / DC Orders ED Discharge Orders     None         Lennice Sites, DO 06/30/21 2230

## 2021-06-30 NOTE — ED Triage Notes (Signed)
C/O MS flair up, stated both of his hands felt numb when he woke up around 5 pm,  stated around 6 PM, he was unable to move hi sleft leg. Patient is febrile, stated he's been having some cough as  well. Stated he recently seen his neurologist earlier today and everything checked out.

## 2021-07-01 DIAGNOSIS — F32A Depression, unspecified: Secondary | ICD-10-CM | POA: Diagnosis present

## 2021-07-01 DIAGNOSIS — Z8616 Personal history of COVID-19: Secondary | ICD-10-CM | POA: Diagnosis not present

## 2021-07-01 DIAGNOSIS — U071 COVID-19: Principal | ICD-10-CM

## 2021-07-01 DIAGNOSIS — Z28310 Unvaccinated for covid-19: Secondary | ICD-10-CM | POA: Diagnosis not present

## 2021-07-01 DIAGNOSIS — G35 Multiple sclerosis: Secondary | ICD-10-CM

## 2021-07-01 DIAGNOSIS — Z87891 Personal history of nicotine dependence: Secondary | ICD-10-CM | POA: Diagnosis not present

## 2021-07-01 DIAGNOSIS — Z79899 Other long term (current) drug therapy: Secondary | ICD-10-CM | POA: Diagnosis not present

## 2021-07-01 LAB — CBC WITH DIFFERENTIAL/PLATELET
Basophils Absolute: 0.1 10*3/uL (ref 0.0–0.2)
Basos: 1 %
EOS (ABSOLUTE): 0.1 10*3/uL (ref 0.0–0.4)
Eos: 3 %
Hematocrit: 40.2 % (ref 37.5–51.0)
Hemoglobin: 13.6 g/dL (ref 13.0–17.7)
Immature Grans (Abs): 0 10*3/uL (ref 0.0–0.1)
Immature Granulocytes: 0 %
Lymphocytes Absolute: 0.6 10*3/uL — ABNORMAL LOW (ref 0.7–3.1)
Lymphs: 17 %
MCH: 29.1 pg (ref 26.6–33.0)
MCHC: 33.8 g/dL (ref 31.5–35.7)
MCV: 86 fL (ref 79–97)
Monocytes Absolute: 0.6 10*3/uL (ref 0.1–0.9)
Monocytes: 17 %
Neutrophils Absolute: 2.2 10*3/uL (ref 1.4–7.0)
Neutrophils: 62 %
Platelets: 258 10*3/uL (ref 150–450)
RBC: 4.68 x10E6/uL (ref 4.14–5.80)
RDW: 12.6 % (ref 11.6–15.4)
WBC: 3.6 10*3/uL (ref 3.4–10.8)

## 2021-07-01 LAB — IGG, IGA, IGM
IgA/Immunoglobulin A, Serum: 72 mg/dL — ABNORMAL LOW (ref 90–386)
IgG (Immunoglobin G), Serum: 730 mg/dL (ref 603–1613)
IgM (Immunoglobulin M), Srm: 17 mg/dL — ABNORMAL LOW (ref 20–172)

## 2021-07-01 LAB — CBC
HCT: 40.7 % (ref 39.0–52.0)
Hemoglobin: 13.6 g/dL (ref 13.0–17.0)
MCH: 29.4 pg (ref 26.0–34.0)
MCHC: 33.4 g/dL (ref 30.0–36.0)
MCV: 88.1 fL (ref 80.0–100.0)
Platelets: 254 10*3/uL (ref 150–400)
RBC: 4.62 MIL/uL (ref 4.22–5.81)
RDW: 12.7 % (ref 11.5–15.5)
WBC: 2.7 10*3/uL — ABNORMAL LOW (ref 4.0–10.5)
nRBC: 0 % (ref 0.0–0.2)

## 2021-07-01 LAB — D-DIMER, QUANTITATIVE: D-Dimer, Quant: <0.27 ug/mL-FEU (ref 0.00–0.50)

## 2021-07-01 LAB — CREATININE, SERUM
Creatinine, Ser: 0.93 mg/dL (ref 0.61–1.24)
GFR, Estimated: >60 mL/min (ref >60)

## 2021-07-01 LAB — PROCALCITONIN: Procalcitonin: <0.1 ng/mL

## 2021-07-01 LAB — HIV ANTIBODY (ROUTINE TESTING W REFLEX): HIV Screen 4th Generation wRfx: NONREACTIVE

## 2021-07-01 MED ORDER — NIRMATRELVIR/RITONAVIR (PAXLOVID)TABLET
3.0000 | ORAL_TABLET | Freq: Two times a day (BID) | ORAL | Status: DC
  Administered 2021-07-01 – 2021-07-02 (×3): 3 via ORAL
  Filled 2021-07-01 (×2): qty 30

## 2021-07-01 MED ORDER — NIRMATRELVIR/RITONAVIR (PAXLOVID)TABLET: 3.0000 | ORAL_TABLET | Freq: Two times a day (BID) | ORAL | Status: DC

## 2021-07-01 MED ORDER — ACETAMINOPHEN 325 MG PO TABS
650.0000 mg | ORAL_TABLET | Freq: Four times a day (QID) | ORAL | Status: DC | PRN
  Administered 2021-07-02: 650 mg via ORAL
  Filled 2021-07-01: qty 2

## 2021-07-01 MED ORDER — POLYETHYLENE GLYCOL 3350 17 G PO PACK: 17.0000 g | PACK | Freq: Every day | ORAL | Status: DC | PRN

## 2021-07-01 MED ORDER — ENOXAPARIN SODIUM 40 MG/0.4ML IJ SOSY
40.0000 mg | PREFILLED_SYRINGE | INTRAMUSCULAR | Status: DC
  Filled 2021-07-01: qty 0.4

## 2021-07-01 MED ORDER — OCRELIZUMAB 300 MG/10ML IV SOLN: 600.0000 mg | INTRAVENOUS | Status: DC

## 2021-07-01 MED ORDER — DEXAMETHASONE SODIUM PHOSPHATE 10 MG/ML IJ SOLN
10.0000 mg | Freq: Once | INTRAMUSCULAR | Status: AC
  Administered 2021-07-01: 10 mg via INTRAVENOUS
  Filled 2021-07-01: qty 1

## 2021-07-01 MED ORDER — ETODOLAC 200 MG PO CAPS
400.0000 mg | ORAL_CAPSULE | Freq: Two times a day (BID) | ORAL | Status: DC
  Administered 2021-07-02 (×2): 400 mg via ORAL
  Filled 2021-07-01 (×2): qty 2

## 2021-07-01 MED ORDER — ONDANSETRON 4 MG PO TBDP
4.0000 mg | ORAL_TABLET | Freq: Once | ORAL | Status: AC
  Administered 2021-07-01: 4 mg via ORAL
  Filled 2021-07-01: qty 1

## 2021-07-01 MED ORDER — ONDANSETRON HCL 4 MG/2ML IJ SOLN: 4.0000 mg | Freq: Four times a day (QID) | INTRAMUSCULAR | Status: DC | PRN

## 2021-07-01 MED ORDER — AMPHETAMINE-DEXTROAMPHET ER 10 MG PO CP24
20.0000 mg | ORAL_CAPSULE | Freq: Every day | ORAL | Status: DC
Start: 2021-07-01 — End: 2021-07-02
  Filled 2021-07-01: qty 2

## 2021-07-01 MED ORDER — SODIUM CHLORIDE 0.9 % IV SOLN: INTRAVENOUS | Status: DC

## 2021-07-01 MED ORDER — ACETAMINOPHEN 650 MG RE SUPP: 650.0000 mg | Freq: Four times a day (QID) | RECTAL | Status: DC | PRN

## 2021-07-01 MED ORDER — BACLOFEN 20 MG PO TABS
20.0000 mg | ORAL_TABLET | Freq: Three times a day (TID) | ORAL | Status: DC
  Administered 2021-07-01 – 2021-07-02 (×3): 20 mg via ORAL
  Filled 2021-07-01: qty 1
  Filled 2021-07-01: qty 2
  Filled 2021-07-01: qty 1

## 2021-07-01 MED ORDER — ONDANSETRON HCL 4 MG PO TABS: 4.0000 mg | ORAL_TABLET | Freq: Four times a day (QID) | ORAL | Status: DC | PRN

## 2021-07-01 NOTE — ED Notes (Signed)
Called and spoke with Channing Mutters in the [facility] pharmacy. Sharilyn Sites will be called to bring the Paxlovid medication to this facility.

## 2021-07-01 NOTE — ED Notes (Signed)
Handoff report given to Scientist, research (medical) at [employer]

## 2021-07-01 NOTE — ED Notes (Signed)
[facility] at bedside 

## 2021-07-01 NOTE — H&P (Signed)
History and Physical  Mike Lowery [id number] DOB: 19-Apr-1986 DOA: 06/30/2021  PCP: Britt Bottom, MD Patient coming from: Home  I have personally briefly reviewed patient's old medical records in [place]   Chief Complaint: leg weakness  HPI: Mike Lowery is a 36 y.o. male past medical history of multiple sclerosis depression and generalized weakness who comes in for fever and worsening lower extremity weakness and numbness that started the day prior to admission, he relates he saw his neurologist as an outpatient but he did not have any fever when he saw him he has never been vaccinated and started being unable to walk due to extreme weakness in the left lower extremity, he relates this happens when he gets sick or gets an MS flare.  He relates numbness and tingling denies any cough maybe a mild temperature.  In the ED: SARS-CoV-2 PCR was positive influenza was negative has mild leukopenia, chest x-ray showed no infiltrate saturations greater than 93% on room air   Review of Systems: All systems reviewed and apart from history of presenting illness, are negative.  Past Medical History:  Diagnosis Date   Depression    situaltional   Dyspnea    Multiple sclerosis ([place]) 12/24/13   Multiple sclerosis ([place])    Vision abnormalities    Past Surgical History:  Procedure Laterality Date   NO PAST SURGERIES     Social History:  reports that he quit smoking about 8 years ago. His smoking use included cigarettes. He quit smokeless tobacco use about 6 years ago. He reports current alcohol use. He reports that he does not use drugs.   No Known Allergies  Family History  Problem Relation Age of Onset   Healthy Mother    Healthy Father    Cancer Maternal Grandmother        unknown    Ataxia Sister    Multiple sclerosis Neg Hx     Prior to Admission medications   Medication Sig Start Date End Date Taking? Authorizing Provider  amphetamine-dextroamphetamine  (ADDERALL XR) 20 MG 24 hr capsule Take 1 capsule (20 mg total) by mouth daily. 06/10/21  Yes Sater, Nanine Means, MD  baclofen (LIORESAL) 20 MG tablet Take 1 tablet (20 mg total) by mouth 3 (three) times daily. 12/23/20  Yes Sater, Nanine Means, MD  ocrelizumab (OCREVUS) 300 MG/10ML injection Inject 20 mLs (600 mg total) into the vein every 6 (six) months. 04/18/18  Yes Sater, Nanine Means, MD  dalfampridine 10 MG TB12 One po q12 hours Patient not taking: Reported on 07/01/2021 06/30/21   Lomax, Amy, NP  etodolac (LODINE) 400 MG tablet Take 1 tablet (400 mg total) by mouth 2 (two) times daily. 06/30/21   Debbora Presto, NP   Physical Exam: Vitals:   07/01/21 0600 07/01/21 0630 07/01/21 0900 07/01/21 1214  BP: 109/69  119/76 108/74  Pulse: 74 73 67 67  Resp: 14  16 18   Temp:    99.2 F (37.3 C)  TempSrc:    Oral  SpO2: 98% 98% 95% 98%  Weight:      Height:        General exam: Moderately built and nourished patient, lying comfortably supine on the gurney in no obvious distress. Head, eyes and ENT: Nontraumatic and normocephalic. Pupils equally reacting to light and accommodation. Oral mucosa moist. Neck: Supple. No JVD, carotid bruit or thyromegaly. Lymphatics: No lymphadenopathy. Respiratory system: Clear to auscultation. No increased work of breathing. Cardiovascular system:  S1 and S2 heard, RRR. No JVD, murmurs, gallops, clicks or pedal edema. Gastrointestinal system: Abdomen is nondistended, soft and nontender. Normal bowel sounds heard. No organomegaly or masses appreciated. Extremities: Symmetric 5 x 5 power. Peripheral pulses symmetrically felt.  Skin: No rashes or acute findings. Musculoskeletal system: Negative exam. Psychiatry: Pleasant and cooperative.   Labs on Admission:  Basic Metabolic Panel: Recent Labs  Lab 06/30/21 2209  NA 140  K 3.9  CL 105  CO2 27  GLUCOSE 83  BUN 10  CREATININE 1.03  CALCIUM 8.9   Liver Function Tests: Recent Labs  Lab 06/30/21 2209  AST 21  ALT  36  ALKPHOS 56  BILITOT 0.4  PROT 6.7  ALBUMIN 4.7   No results for input(s): LIPASE, AMYLASE in the last 168 hours. No results for input(s): AMMONIA in the last 168 hours. CBC: Recent Labs  Lab 06/30/21 0944 06/30/21 2209  WBC 3.6 3.2*  NEUTROABS 2.2 2.3  HGB 13.6 13.6  HCT 40.2 41.6  MCV 86 87.4  PLT 258 241   Cardiac Enzymes: No results for input(s): CKTOTAL, CKMB, CKMBINDEX, TROPONINI in the last 168 hours.  BNP (last 3 results) No results for input(s): PROBNP in the last 8760 hours. CBG: No results for input(s): GLUCAP in the last 168 hours.  Radiological Exams on Admission: DG Chest Portable 1 View  Result Date: 06/30/2021 CLINICAL DATA:  COVID-19. EXAM: PORTABLE CHEST 1 VIEW COMPARISON:  Chest radiograph dated 06/04/2020. FINDINGS: The heart size and mediastinal contours are within normal limits. Both lungs are clear. The visualized skeletal structures are unremarkable. IMPRESSION: No active disease. Electronically Signed   By: Anner Crete M.D.   On: 06/30/2021 22:27    EKG: Independently reviewed. none  Assessment/Plan Acute COVID-19/ COVID-19 virus infection: We will admit as inpatient, start him on Paxlovid. He is not hypoxic so there is no indication for steroids. We will monitor D-dimer I am sure is going to be elevated but he is partially bedbound and with COVID diabetes poses him to thromboembolic events. Consult physical therapy. He is able to keep himself hydrated he relates he would like to try oral hydration. Monitor his fever curve use Tylenol. Resume his Dulcolax twice a day  Multiple sclerosis ([place]) Resume his home regimen, he is supposed to get on Thursday his Ocrevus I have scheduled this. Continue baclofen 3 times a day.     DVT Prophylaxis: lovenox Code Status: Full  Family Communication: none  Disposition Plan: inpatient   Time spent: 75 min    It is my clinical opinion that admission to INPATIENT is reasonable and necessary  in this 36 y.o. male who is admitted for bilateral lower extremity weakness in the setting of COVID-19 infection with multiple risk factors including MS. Given the aforementioned, the predictability of an adverse outcome is felt to be significant. I expect that the patient will require at least 2 midnights in the hospital to treat this condition.  Charlynne Cousins MD [facility]   07/01/2021, 4:08 PM

## 2021-07-01 NOTE — ED Notes (Signed)
Handoff report given to [facility] 

## 2021-07-02 ENCOUNTER — Telehealth: Payer: Self-pay | Admitting: Family Medicine

## 2021-07-02 ENCOUNTER — Encounter: Payer: Self-pay | Admitting: Family Medicine

## 2021-07-02 LAB — C-REACTIVE PROTEIN: CRP: 0.7 mg/dL (ref <1.0)

## 2021-07-02 LAB — BASIC METABOLIC PANEL
Anion gap: 10 (ref 5–15)
BUN: 8 mg/dL (ref 6–20)
CO2: 23 mmol/L (ref 22–32)
Calcium: 9.2 mg/dL (ref 8.9–10.3)
Chloride: 108 mmol/L (ref 98–111)
Creatinine, Ser: 0.7 mg/dL (ref 0.61–1.24)
GFR, Estimated: >60 mL/min (ref >60)
Glucose, Bld: 123 mg/dL — ABNORMAL HIGH (ref 70–99)
Potassium: 3.8 mmol/L (ref 3.5–5.1)
Sodium: 141 mmol/L (ref 135–145)

## 2021-07-02 LAB — VITAMIN B12: Vitamin B-12: 242 pg/mL (ref 180–914)

## 2021-07-02 LAB — CBC
HCT: 41.1 % (ref 39.0–52.0)
Hemoglobin: 13.8 g/dL (ref 13.0–17.0)
MCH: 29.4 pg (ref 26.0–34.0)
MCHC: 33.6 g/dL (ref 30.0–36.0)
MCV: 87.6 fL (ref 80.0–100.0)
Platelets: 270 10*3/uL (ref 150–400)
RBC: 4.69 MIL/uL (ref 4.22–5.81)
RDW: 12.6 % (ref 11.5–15.5)
WBC: 2.9 10*3/uL — ABNORMAL LOW (ref 4.0–10.5)
nRBC: 0 % (ref 0.0–0.2)

## 2021-07-02 MED ORDER — NIRMATRELVIR/RITONAVIR (PAXLOVID)TABLET: 3.0000 | ORAL_TABLET | Freq: Two times a day (BID) | ORAL | Status: DC

## 2021-07-02 MED ORDER — OCREVUS 300 MG/10ML IV SOLN
INTRAVENOUS | 0 refills | Status: AC
Start: 2021-07-02 — End: ?

## 2021-07-02 NOTE — Telephone Encounter (Signed)
Called and spoke with pt. Relayed he should call infusion suite back and r/s for week of 07/14/21 given Dr. Garth Bigness message below. He verbalized understanding.   Per Dr. Felecia Shelling: "He was hospitalized for COVID and increased neurologic symptoms (probably due to the fever/infection).  He was just discharged.  I would like to hold the Ocrevus for 2 weeks (was due to have it done later this week or early next week). "

## 2021-07-02 NOTE — Assessment & Plan Note (Signed)
Ocrevus held secondary to active infection. Patient to follow-up with neurologist.

## 2021-07-02 NOTE — Hospital Course (Signed)
Mike Lowery is a 36 y.o. male with a history of multiple sclerosis, depression and generalized weakness that presented with worsened leg weakness and found to have a COVID-19 infection. Neurology was consulted and recommended treatment of COVID-19 infection as weakness was likely related to pseudoflare, rather than true MS flare. Patient improved with observation and treatment with Paxlovid.

## 2021-07-02 NOTE — Discharge Summary (Signed)
Physician Discharge Summary   Patient: Mike Lowery MRN: [id number] DOB: Jun 27, 1985  Admit date:     06/30/2021  Discharge date: 07/02/21  Discharge Physician: Jacquelin Hawking, MD   PCP: Asa Lente, MD   Recommendations at discharge:   Hold Ocrevus secondary to active infection. 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 *   Hospital Course: Mike Lowery is a 36 y.o. male with a history of multiple sclerosis, depression and generalized weakness that presented with worsened leg weakness and found to have a COVID-19 infection. Neurology was consulted and recommended treatment of COVID-19 infection as weakness was likely related to pseudoflare, rather than true MS flare. Patient improved with observation and treatment with Paxlovid.  Assessment and Plan: COVID-19 virus infection- (present on admission) Asymptomatic. Discharge with Paxlovid.  Multiple sclerosis (H[facility])- (present on admission) Ocrevus held secondary to active infection. Patient to follow-up with neurologist.         Consultants: None Procedures performed: None  Disposition: Home Diet recommendation:  Regular diet  DISCHARGE MEDICATION: Allergies as of 07/02/2021   No Known Allergies      Medication List     TAKE these medications    amphetamine-dextroamphetamine 20 MG 24 hr capsule Commonly known as: Adderall XR Take 1 capsule (20 mg total) by mouth daily.   baclofen 20 MG tablet Commonly known as: LIORESAL Take 1 tablet (20 mg total) by mouth 3 (three) times daily.   dalfampridine 10 MG Tb12 One po q12 hours   etodolac 400 MG tablet Commonly known as: LODINE Take 1 tablet (400 mg total) by mouth 2 (two) times daily.    nirmatrelvir/ritonavir EUA 20 x 150 MG & 10 x 100MG  Tabs Commonly known as: PAXLOVID Take 3 tablets by mouth 2 (two) times daily.   Ocrevus 300 MG/10ML injection Generic drug: ocrelizumab HOLD WHILE ACTIVE INFECTION. PLEASE CHECK WITH YOUR NEUROLOGIST ON WHEN TO RESTART. What changed:  how much to take how to take this when to take this additional instructions         Discharge Exam: Filed Weights   06/30/21 2048  Weight: 64.4 kg   General exam: Appears calm and comfortable Respiratory system: Respiratory effort normal. Gastrointestinal system: Abdomen is nondistended, soft and nontender. Central nervous system: Alert and oriented. Musculoskeletal: No edema. No calf tenderness    Condition at discharge: stable  The results of significant diagnostics from this hospitalization (including imaging, microbiology, ancillary and laboratory) are listed below for reference.   Imaging Studies: DG Chest Portable 1 View  Result Date: 06/30/2021 CLINICAL DATA:  COVID-19. EXAM: PORTABLE CHEST 1 VIEW COMPARISON:  Chest radiograph dated 06/04/2020. FINDINGS: The heart size and mediastinal contours are within normal limits. Both lungs are clear. The visualized skeletal structures are unremarkable. IMPRESSION: No active disease. Electronically Signed   By: 06/06/2020 M.D.   On: 06/30/2021 22:27    Microbiology: Results for orders placed or performed during the hospital encounter of 06/30/21  Resp Panel by RT-PCR (Flu A&B, Covid) Nasopharyngeal Swab     Status: Abnormal   Collection Time: 06/30/21  8:53 PM   Specimen: Nasopharyngeal Swab; Nasopharyngeal(NP) swabs in vial transport medium  Result Value Ref Range Status   SARS Coronavirus 2 by RT PCR POSITIVE (A) NEGATIVE Final  Comment: (NOTE) SARS-CoV-2 target nucleic acids are DETECTED.  The SARS-CoV-2 RNA is generally detectable in upper respiratory specimens during the acute phase of infection. Positive results  are indicative of the presence of the identified virus, but do not rule out bacterial infection or co-infection with other pathogens not detected by the test. Clinical correlation with patient history and other diagnostic information is necessary to determine patient infection status. The expected result is Negative.  Fact Sheet for Patients: BloggerCourse.com  Fact Sheet for Healthcare Providers: SeriousBroker.it  This test is not yet approved or cleared by the [place] FDA and  has been authorized for detection and/or diagnosis of SARS-CoV-2 by FDA under an Emergency Use Authorization (EUA).  This EUA will remain in effect (meaning this test can be used) for the duration of  the COVID-19 declaration under Section 564(b)(1) of the A ct, 21 U.S.C. section 360bbb-3(b)(1), unless the authorization is terminated or revoked sooner.     Influenza A by PCR NEGATIVE NEGATIVE Final   Influenza B by PCR NEGATIVE NEGATIVE Final    Comment: (NOTE) The Xpert Xpress SARS-CoV-2/FLU/RSV plus assay is intended as an aid in the diagnosis of influenza from Nasopharyngeal swab specimens and should not be used as a sole basis for treatment. Nasal washings and aspirates are unacceptable for Xpert Xpress SARS-CoV-2/FLU/RSV testing.  Fact Sheet for Patients: BloggerCourse.com  Fact Sheet for Healthcare Providers: SeriousBroker.it  This test is not yet approved or cleared by the [place] FDA and has been authorized for detection and/or diagnosis of SARS-CoV-2 by FDA under an Emergency Use Authorization (EUA). This EUA will remain in effect (meaning this test can be used) for the duration of the COVID-19 declaration under Section 564(b)(1) of the Act, 21 U.S.C. section 360bbb-3(b)(1), unless the authorization is terminated or revoked.  Performed at [facility], [address] [place], [place] [address]     Labs: CBC: Recent Labs  Lab 06/30/21 [phone] 06/30/21 2209 07/01/21 1636 07/02/21 0540  WBC 3.6 3.2* 2.7* 2.9*  NEUTROABS 2.2 2.3  --   --   HGB 13.6 13.6 13.6 13.8  HCT 40.2 41.6 40.7 41.1  MCV 86 87.4 88.1 87.6  PLT 258 241 254 270   Basic Metabolic Panel: Recent Labs  Lab 06/30/21 2209 07/01/21 1636 07/02/21 0540  NA 140  --  141  K 3.9  --  3.8  CL 105  --  108  CO2 27  --  23  GLUCOSE 83  --  123*  BUN 10  --  8  CREATININE 1.03 0.93 0.70  CALCIUM 8.9  --  9.2   Liver Function Tests: Recent Labs  Lab 06/30/21 2209  AST 21  ALT 36  ALKPHOS 56  BILITOT 0.4  PROT 6.7  ALBUMIN 4.7     Signed: Jacquelin Hawking, MD [facility] 07/02/2021

## 2021-07-02 NOTE — Assessment & Plan Note (Signed)
Asymptomatic. Discharge with Paxlovid.

## 2021-07-02 NOTE — Discharge Instructions (Addendum)
Mike Lowery,  You were in the hospital with COVID-19 and some weakness. Your weakness appears to be related to your infection and has improved. Please follow-up with your neurologist regarding your Ocrevus. Please continue Paxlovid. Please stay isolated until 06/10/2021.

## 2021-07-02 NOTE — Telephone Encounter (Signed)
Pt would like a call from to discuss if ok to get infusion on tomorrow morning. Because tested positive for Covid recommended by infusion to contact neurologist.

## 2021-07-03 ENCOUNTER — Inpatient Hospital Stay (HOSPITAL_COMMUNITY): Admission: RE | Admit: 2021-07-03 | Payer: Medicare Other | Source: Ambulatory Visit

## 2021-07-28 ENCOUNTER — Encounter (HOSPITAL_COMMUNITY)
Admission: RE | Admit: 2021-07-28 | Discharge: 2021-07-28 | Disposition: A | Discharge status: Home / Self Care | Payer: Medicare Other | Source: Ambulatory Visit | Attending: Neurology | Admitting: Neurology

## 2021-07-28 ENCOUNTER — Other Ambulatory Visit: Payer: Self-pay | Admitting: *Deleted

## 2021-07-28 ENCOUNTER — Other Ambulatory Visit: Payer: Self-pay

## 2021-07-28 DIAGNOSIS — G35 Multiple sclerosis: Secondary | ICD-10-CM | POA: Insufficient documentation

## 2021-07-28 MED ORDER — METHYLPREDNISOLONE SODIUM SUCC 125 MG IJ SOLR: 125.0000 mg | Freq: Once | INTRAMUSCULAR | Status: AC

## 2021-07-28 MED ORDER — ACETAMINOPHEN 325 MG PO TABS: 650.0000 mg | ORAL_TABLET | Freq: Once | ORAL | Status: AC

## 2021-07-28 MED ORDER — METHYLPREDNISOLONE SODIUM SUCC 125 MG IJ SOLR
INTRAMUSCULAR | Status: AC
  Administered 2021-07-28: 125 mg via INTRAVENOUS
  Filled 2021-07-28: qty 2

## 2021-07-28 MED ORDER — ACETAMINOPHEN 325 MG PO TABS
ORAL_TABLET | ORAL | Status: AC
  Administered 2021-07-28: 650 mg via ORAL
  Filled 2021-07-28: qty 2

## 2021-07-28 MED ORDER — DIPHENHYDRAMINE HCL 50 MG/ML IJ SOLN
INTRAMUSCULAR | Status: AC
  Administered 2021-07-28: 50 mg via INTRAVENOUS
  Filled 2021-07-28: qty 1

## 2021-07-28 MED ORDER — SODIUM CHLORIDE 0.9 % IV SOLN
600.0000 mg | INTRAVENOUS | Status: AC
  Administered 2021-07-28: 600 mg via INTRAVENOUS
  Filled 2021-07-28 (×2): qty 20

## 2021-07-28 MED ORDER — FAMOTIDINE IN NACL 20-0.9 MG/50ML-% IV SOLN
INTRAVENOUS | Status: AC
  Administered 2021-07-28: 20 mg via INTRAVENOUS
  Filled 2021-07-28: qty 50

## 2021-07-28 MED ORDER — DIPHENHYDRAMINE HCL 50 MG/ML IJ SOLN: 50.0000 mg | Freq: Once | INTRAMUSCULAR | Status: AC

## 2021-07-28 MED ORDER — FAMOTIDINE IN NACL 20-0.9 MG/50ML-% IV SOLN: 20.0000 mg | Freq: Once | INTRAVENOUS | Status: AC

## 2021-09-08 ENCOUNTER — Ambulatory Visit (HOSPITAL_COMMUNITY): Payer: Medicare Other

## 2021-10-23 ENCOUNTER — Other Ambulatory Visit: Payer: Self-pay | Admitting: Family Medicine

## 2021-10-23 MED ORDER — AMPHETAMINE-DEXTROAMPHET ER 20 MG PO CP24: 20.0000 mg | ORAL_CAPSULE | Freq: Every day | ORAL | 0 refills | Status: DC

## 2021-10-23 NOTE — Telephone Encounter (Signed)
Pt request refill for amphetamine-dextroamphetamine (ADDERALL XR) 20 MG 24 hr capsule at [facility] [phone]

## 2021-12-09 ENCOUNTER — Telehealth: Payer: Self-pay | Admitting: *Deleted

## 2021-12-09 NOTE — Telephone Encounter (Signed)
Called [employer] at [phone]. Spoke w/ Charna Elizabeth. Showing pt does not have active coverage as of 05/18/2020 with them. Pt ID: [id number]. Ref# for call: [id number].   Appears pt has UHC/Medicare per epic. Called UHC/Medicare, optum spec at [phone]. Spoke w/ Kelby Fam. Pt ID: [id number]. Auth approved 01/19/2022-01/20/2023. Auth# [id number].   Jcode for Ocrevus: [id number] Started on therapy: 08/05/2016 Tried/failed: Edwin Dada  [facility]) [address] [place],  [place]  [address] Main: [phone] Fax: [phone] [id number] Tax ID: [id number]

## 2021-12-23 ENCOUNTER — Ambulatory Visit (HOSPITAL_COMMUNITY): Payer: Medicare Other

## 2021-12-29 ENCOUNTER — Ambulatory Visit (INDEPENDENT_AMBULATORY_CARE_PROVIDER_SITE_OTHER): Payer: Medicare Other | Admitting: Neurology

## 2021-12-29 ENCOUNTER — Encounter: Payer: Self-pay | Admitting: Neurology

## 2021-12-29 VITALS — BP 113/69 | HR 56 | Ht 66.0 in | Wt 145.0 lb

## 2021-12-29 DIAGNOSIS — G35 Multiple sclerosis: Secondary | ICD-10-CM

## 2021-12-29 DIAGNOSIS — Z79899 Other long term (current) drug therapy: Secondary | ICD-10-CM

## 2021-12-29 DIAGNOSIS — R269 Unspecified abnormalities of gait and mobility: Secondary | ICD-10-CM

## 2021-12-29 DIAGNOSIS — R252 Cramp and spasm: Secondary | ICD-10-CM | POA: Diagnosis not present

## 2021-12-29 DIAGNOSIS — R5383 Other fatigue: Secondary | ICD-10-CM

## 2021-12-29 DIAGNOSIS — R3911 Hesitancy of micturition: Secondary | ICD-10-CM | POA: Diagnosis not present

## 2021-12-29 MED ORDER — AMPHETAMINE-DEXTROAMPHET ER 20 MG PO CP24: 20.0000 mg | ORAL_CAPSULE | Freq: Every day | ORAL | 0 refills | Status: DC

## 2021-12-29 MED ORDER — GABAPENTIN 600 MG PO TABS: ORAL_TABLET | ORAL | 3 refills | Status: DC

## 2021-12-29 NOTE — Progress Notes (Signed)
[facility]  PATIENT: CURVIN HUNGER DOB: 07/03/1985  REFERRING DOCTOR OR PCP:  Dr. Hyman Hopes SOURCE: Patient, family, records in the EMR, MRI images on PACS  _________________________________   HISTORICAL  CHIEF COMPLAINT:  Chief Complaint  Patient presents with   Follow-up    Rm 18, alone. Here for 6 month MS f/u, on Ocrevus and tolerating well. Last infusion date: 06/2021 and Next infusion date:TBD. Pt reports doing well.     HISTORY OF PRESENT ILLNESS:  Colburn Colocho,is a 36 y.o. man with relapsing remitting multiple sclerosis.    Update 8/14/20231: He is on Ocrevus and tolerates it well.   His last infusion was 06/2021  Next infusion is in 1-2 weeks.     He has not had any exacerbation while on it.     CBC was ok.   IgM and IgA were slightly reduced at last visit  His gait is about the same as last year.   He falls and trips frequently.   He does not use a cane.       He has noted spasticity mostly in his left leg.   Left leg is weaker and feels heavy.Arms are strong.     He has pain,  numbness and tingling in the left leg.   Marland KitchenHe take baclofen 10 mg po  He has mild urinary hesitancy that is stable.    Vision is doing well.  He notes mild fatigue that fluctuates a lot.  He avoids going outside in the heat.   He sleeps poorly some nights - sleep mainenance worse than onset.    He has mild  depression but no anxiety.   He has attention deficit / mental fog, and it is helped by Adderall     He had Covid-19 in January 2022 and felt wiped out.  Breathing was fine.     MS HISTORY:  In July/Aug 2015, he had the onset of weakness and numbness in the arms and legs, a little worse on the left.   When symptoms persisted, he presented to [facility] emergency room. MRI was consistent with multiple sclerosis and he was admitted. The MRI of the brain showed several plaques, mostly in the periventricular white matter. The MRI of the cervical spine showed several large T2  hyperintense foci, with some enhancement. He was given IV Solu-Medrol and his symptoms improved quite a bit. He followed up with Dr. Everlena Cooper and was placed on Plegridy about 8 or 9 months after starting Plegridy, he had another exacerbation and MRI of the brain and cervical spine were performed again 02/21/2015. The MRI of the cervical spine does not show any definite new lesions. However, the MRI of the brain does show several new lesions including for small enhancing foci. He was switched to [facility] November or December 2016.   I have personally reviewed the MRIs of the brain and cervical spine from October 2016. The MRI of the brain shows multiple T2/FLAIR hyperintense foci consistent with MS in the hemispheres and brainstem.   4 of the hemispheric small foci enhanced after gadolinium administration. The MRI of the cervical spine shows multiple hyperintense foci. There was a normal enhancement pattern.   He had a large exacerbation while on Tecfidera with new gait difficulties March 2018.   MRI's show multiple new lesions in the brain with several lesions enhancing including a larger one in the left periventrivcular white matter.    Also has a new focus adjacent to C4 in  the spinal cord.  He was switched to ocrelizumab and had his first infusion at the end of March 2018.     MRI images MRI of the brain 06/04/2020 showed T2/flair hyperintense foci in the brainstem, left cerebellar hemisphere, thalamus and the hemispheres.  None of the foci enhanced.  No changes compared to the 12/28/2019 MRI.  MRI of the cervical spine 06/04/2020 and 07/16/2016 showed multiple T2 hyperintense foci within the spinal cord.  They are somewhat confluent and primarily located posteriorly adjacent to C2, posteriorly and more to the left adjacent to C3-C4, posteriorly more to the right adjacent to C4-C5, posteriorly adjacent to C5-C6, more to the left adjacent to C6-C7 and posteriorly adjacent to C7-T1, adjacent to T1.  No enhancing  lesions.  No change between 2018 and 2022.  MRI of the thoracic spine 06/04/2020 showed multiple T2 hyperintense foci within the spinal cord, no comparison films.  Number the foci appear to be acute.  They do not enhance.  MRI of the brain 12/28/2019 showed multiple T2/FLAIR hyperintense foci in the brainstem, left cerebellar hemisphere, left thalamus and in the periventricular, juxtacortical and deep white matter both hemispheres in a pattern and configuration consistent with chronic demyelinating plaque associated with multiple sclerosis.  None of the foci appear to be acute and they do not enhance.  Compared to the MRI from 01/01/2018, there are no new lesions.  Normal enhancement pattern and no acute findings REVIEW OF SYSTEMS: Constitutional: No fevers, chills, sweats, or change in appetite.   He has fatigue and sleepiness. Eyes: No visual changes, double vision, eye pain Ear, nose and throat: No hearing loss, ear pain, nasal congestion, sore throat Cardiovascular: No chest pain, palpitations Respiratory:  No shortness of breath at rest or with exertion.   No wheezes GastrointestinaI: No nausea, vomiting, diarrhea, abdominal pain, fecal incontinence Genitourinary:  He notes urinary frequency and ED.    Musculoskeletal:  No neck pain, back pain Integumentary: No rash, pruritus, skin lesions Neurological: as above Psychiatric: See above Endocrine: No palpitations, diaphoresis, change in appetite, change in weigh or increased thirst Hematologic/Lymphatic:  No anemia, purpura, petechiae. Allergic/Immunologic: No itchy/runny eyes, nasal congestion, recent allergic reactions, rashes  ALLERGIES: No Known Allergies  HOME MEDICATIONS:  Current Outpatient Medications:    baclofen (LIORESAL) 20 MG tablet, Take 1 tablet (20 mg total) by mouth 3 (three) times daily., Disp: 90 tablet, Rfl: 11   dalfampridine 10 MG TB12, One po q12 hours, Disp: 60 tablet, Rfl: 5   etodolac (LODINE) 400 MG tablet,  Take 1 tablet (400 mg total) by mouth 2 (two) times daily., Disp: 60 tablet, Rfl: 5   gabapentin (NEURONTIN) 600 MG tablet, 1/2 to 1 po qHS, Disp: 90 tablet, Rfl: 3   nirmatrelvir/ritonavir EUA (PAXLOVID) 20 x 150 MG & 10 x 100MG  TABS, Take 3 tablets by mouth 2 (two) times daily., Disp: , Rfl:    ocrelizumab (OCREVUS) 300 MG/10ML injection, HOLD WHILE ACTIVE INFECTION. PLEASE CHECK WITH YOUR NEUROLOGIST ON WHEN TO RESTART., Disp: 20 mL, Rfl: 0   amphetamine-dextroamphetamine (ADDERALL XR) 20 MG 24 hr capsule, Take 1 capsule (20 mg total) by mouth daily., Disp: 30 capsule, Rfl: 0  PAST MEDICAL HISTORY: Past Medical History:  Diagnosis Date   Depression    situaltional   Dyspnea    Multiple sclerosis (HCC) 12/24/13   Multiple sclerosis (HCC)    Vision abnormalities     PAST SURGICAL HISTORY: Past Surgical History:  Procedure Laterality Date   NO PAST  SURGERIES      FAMILY HISTORY: Family History  Problem Relation Age of Onset   Healthy Mother    Healthy Father    Cancer Maternal Grandmother        unknown    Ataxia Sister    Multiple sclerosis Neg Hx     SOCIAL HISTORY:  Social History   Socioeconomic History   Marital status: Single    Spouse name: Not on file   Number of children: Not on file   Years of education: Not on file   Highest education level: Not on file  Occupational History   Not on file  Tobacco Use   Smoking status: Former    Types: Cigarettes    Quit date: 04/18/2013    Years since quitting: 8.7   Smokeless tobacco: Former    Quit date: 09/08/2014  Substance and Sexual Activity   Alcohol use: Yes   Drug use: No    Types: Marijuana    Comment: former   Sexual activity: Yes    Partners: Female  Other Topics Concern   Not on file  Social History Narrative   Not on file   Social Determinants of Health   Financial Resource Strain: Not on file  Food Insecurity: Not on file  Transportation Needs: Not on file  Physical Activity: Not on file   Stress: Not on file  Social Connections: Not on file  Intimate Partner Violence: Not on file     PHYSICAL EXAM  Vitals:   12/29/21 0959  BP: 113/69  Pulse: (!) 56  Weight: 145 lb (65.8 kg)  Height: 5\' 6"  (1.676 m)    Body mass index is 23.4 kg/m.   General: The patient is well-developed and well-nourished and in no acute distress   Skin: Extremities are without rash or edema.  Neurologic Exam  Mental status: The patient is alert and oriented x 3 at the time of the examination. The patient has apparent normal recent and remote memory, with a reduced attention span and concentration ability.   Speech is normal.  Cranial nerves: Extraocular movements are full.  Colors are desaturated OD relative to OS. Facial strength is normal.  Trapezius strength is normal..   No obvious hearing deficits are noted.  Motor:   Muscle bulk is normal.   He has increased muscle tone in the legs.  Strength is 5/5.  Sensory:  He reports normal and symmetric sensation to touch and vibration in the arms or legs..  Coordination: On cerebellar testing finger-nose-finger is normal.  Heel-to-shin is mildly reduced.  Gait and station: Station is normal.   The gait is wide and spastic (left) after walking 50 feet he had a slight foot drop on the left.  The tandem gait is wide.  Romberg is borderline.   Reflexes: Deep tendon reflexes are symmetric and 3 in arms.  He has increased reflexes in the legs but no ankle clonus.    25 foot timed walk 11.2 seconds (average of 2 trials 11.0, 11.4)     DIAGNOSTIC DATA (LABS, IMAGING, TESTING) - I reviewed patient records, labs, notes, testing and imaging myself where available.  Lab Results  Component Value Date   WBC 2.9 (L) 07/02/2021   HGB 13.8 07/02/2021   HCT 41.1 07/02/2021   MCV 87.6 07/02/2021   PLT 270 07/02/2021      Component Value Date/Time   NA 141 07/02/2021 0540   NA 143 12/23/2020 1019   K 3.8 07/02/2021 0540  CL 108 07/02/2021  0540   CO2 23 07/02/2021 0540   GLUCOSE 123 (H) 07/02/2021 0540   BUN 8 07/02/2021 0540   BUN 12 12/23/2020 1019   CREATININE 0.70 07/02/2021 0540   CREATININE 0.92 03/27/2015 1130   CALCIUM 9.2 07/02/2021 0540   PROT 6.7 06/30/2021 2209   PROT 7.0 12/23/2020 1019   ALBUMIN 4.7 06/30/2021 2209   ALBUMIN 4.9 12/23/2020 1019   AST 21 06/30/2021 2209   ALT 36 06/30/2021 2209   ALKPHOS 56 06/30/2021 2209   BILITOT 0.4 06/30/2021 2209   BILITOT 0.4 12/23/2020 1019   GFRNONAA >60 07/02/2021 0540   GFRNONAA >89 10/10/2014 1251   GFRAA 103 06/22/2019 0850   GFRAA >89 10/10/2014 1251       ASSESSMENT AND PLAN    1. Multiple sclerosis (HCC)   2. Gait disturbance   3. High risk medication use   4. Spasticity   5. Other fatigue   6. Urinary hesitancy        1.   Continue ocrelizumab for MS. check IgG/IgM and CD19/20.  Check an MRI to determine if there is any subclinical activity.. If this is occurring we would need to consider a different DMT. 2.   Renew Adderall for attention deficit and fatigue 3.    Continue baclofen for spasticity up to 20 mg 3 times daily 4.   Continue dalfampridine ER 10 mg p.o. twice daily for gait.  We will refer to PT.  He would benefit from using a cane. Return to see me in 6 months or sooner if there are new or worsening symptoms.  Chanti Golubski A. Epimenio Foot, MD, PhD 12/29/2021, 10:38 AM Certified in Neurology, Clinical Neurophysiology, Sleep Medicine, Pain Medicine and Neuroimaging  [facility] [address] [place], [place] [address] [phone]

## 2021-12-30 ENCOUNTER — Telehealth: Payer: Self-pay | Admitting: Neurology

## 2021-12-30 LAB — IGG, IGA, IGM
IgA/Immunoglobulin A, Serum: 64 mg/dL — ABNORMAL LOW (ref 90–386)
IgG (Immunoglobin G), Serum: 732 mg/dL (ref 603–1613)
IgM (Immunoglobulin M), Srm: 14 mg/dL — ABNORMAL LOW (ref 20–172)

## 2021-12-30 LAB — CBC WITH DIFFERENTIAL/PLATELET
Basophils Absolute: 0 10*3/uL (ref 0.0–0.2)
Basos: 1 %
EOS (ABSOLUTE): 0.2 10*3/uL (ref 0.0–0.4)
Eos: 4 %
Hematocrit: 38.3 % (ref 37.5–51.0)
Hemoglobin: 13.1 g/dL (ref 13.0–17.7)
Immature Grans (Abs): 0 10*3/uL (ref 0.0–0.1)
Immature Granulocytes: 0 %
Lymphocytes Absolute: 1.7 10*3/uL (ref 0.7–3.1)
Lymphs: 49 %
MCH: 29.8 pg (ref 26.6–33.0)
MCHC: 34.2 g/dL (ref 31.5–35.7)
MCV: 87 fL (ref 79–97)
Monocytes Absolute: 0.4 10*3/uL (ref 0.1–0.9)
Monocytes: 11 %
Neutrophils Absolute: 1.2 10*3/uL — ABNORMAL LOW (ref 1.4–7.0)
Neutrophils: 35 %
Platelets: 301 10*3/uL (ref 150–450)
RBC: 4.4 x10E6/uL (ref 4.14–5.80)
RDW: 12.2 % (ref 11.6–15.4)
WBC: 3.4 10*3/uL (ref 3.4–10.8)

## 2021-12-30 LAB — COMPREHENSIVE METABOLIC PANEL
ALT: 16 IU/L (ref 0–44)
AST: 18 IU/L (ref 0–40)
Albumin/Globulin Ratio: 2.8 — ABNORMAL HIGH (ref 1.2–2.2)
Albumin: 4.7 g/dL (ref 4.1–5.1)
Alkaline Phosphatase: 76 IU/L (ref 44–121)
BUN/Creatinine Ratio: 9 (ref 9–20)
BUN: 9 mg/dL (ref 6–20)
Bilirubin Total: 0.6 mg/dL (ref 0.0–1.2)
CO2: 24 mmol/L (ref 20–29)
Calcium: 9 mg/dL (ref 8.7–10.2)
Chloride: 104 mmol/L (ref 96–106)
Creatinine, Ser: 1 mg/dL (ref 0.76–1.27)
Globulin, Total: 1.7 g/dL (ref 1.5–4.5)
Glucose: 80 mg/dL (ref 70–99)
Potassium: 5 mmol/L (ref 3.5–5.2)
Sodium: 142 mmol/L (ref 134–144)
Total Protein: 6.4 g/dL (ref 6.0–8.5)
eGFR: 101 mL/min/{1.73_m2} (ref >59)

## 2021-12-30 NOTE — Telephone Encounter (Signed)
[employer] medicare/medicaid order sent to [facility], [facility] they will reach out to the patient to schedule.  ?

## 2022-01-09 ENCOUNTER — Ambulatory Visit
Admission: RE | Admit: 2022-01-09 | Discharge: 2022-01-09 | Disposition: A | Discharge status: Home / Self Care | Payer: Medicare Other | Source: Ambulatory Visit | Attending: Neurology | Admitting: Neurology

## 2022-01-09 DIAGNOSIS — Z79899 Other long term (current) drug therapy: Secondary | ICD-10-CM

## 2022-01-09 DIAGNOSIS — G35 Multiple sclerosis: Secondary | ICD-10-CM | POA: Diagnosis not present

## 2022-01-09 MED ORDER — GADOBENATE DIMEGLUMINE 529 MG/ML IV SOLN
13.0000 mL | Freq: Once | INTRAVENOUS | Status: AC | PRN
  Administered 2022-01-09: 13 mL via INTRAVENOUS

## 2022-01-26 ENCOUNTER — Encounter (HOSPITAL_COMMUNITY)
Admission: RE | Admit: 2022-01-26 | Discharge: 2022-01-26 | Disposition: A | Discharge status: Home / Self Care | Payer: Medicare Other | Source: Ambulatory Visit | Attending: Neurology | Admitting: Neurology

## 2022-01-26 ENCOUNTER — Other Ambulatory Visit: Payer: Self-pay

## 2022-01-26 DIAGNOSIS — G35 Multiple sclerosis: Secondary | ICD-10-CM | POA: Insufficient documentation

## 2022-01-26 MED ORDER — FAMOTIDINE IN NACL 20-0.9 MG/50ML-% IV SOLN
INTRAVENOUS | Status: AC
  Administered 2022-01-26: 20 mg via INTRAVENOUS
  Filled 2022-01-26: qty 50

## 2022-01-26 MED ORDER — SODIUM CHLORIDE 0.9 % IV SOLN
600.0000 mg | INTRAVENOUS | Status: AC
  Administered 2022-01-26: 600 mg via INTRAVENOUS
  Filled 2022-01-26: qty 20

## 2022-01-26 MED ORDER — FAMOTIDINE IN NACL 20-0.9 MG/50ML-% IV SOLN: 20.0000 mg | Freq: Once | INTRAVENOUS | Status: AC

## 2022-01-26 MED ORDER — DIPHENHYDRAMINE HCL 50 MG/ML IJ SOLN
INTRAMUSCULAR | Status: AC
  Administered 2022-01-26: 50 mg via INTRAVENOUS
  Filled 2022-01-26: qty 1

## 2022-01-26 MED ORDER — METHYLPREDNISOLONE SODIUM SUCC 125 MG IJ SOLR
INTRAMUSCULAR | Status: AC
  Administered 2022-01-26: 125 mg via INTRAVENOUS
  Filled 2022-01-26: qty 2

## 2022-01-26 MED ORDER — METHYLPREDNISOLONE SODIUM SUCC 125 MG IJ SOLR: 125.0000 mg | Freq: Once | INTRAMUSCULAR | Status: AC

## 2022-01-26 MED ORDER — ACETAMINOPHEN 325 MG PO TABS: 650.0000 mg | ORAL_TABLET | Freq: Once | ORAL | Status: AC

## 2022-01-26 MED ORDER — DIPHENHYDRAMINE HCL 50 MG/ML IJ SOLN: 50.0000 mg | Freq: Once | INTRAMUSCULAR | Status: AC

## 2022-01-26 MED ORDER — ACETAMINOPHEN 325 MG PO TABS
ORAL_TABLET | ORAL | Status: AC
  Administered 2022-01-26: 650 mg via ORAL
  Filled 2022-01-26: qty 2

## 2022-01-30 ENCOUNTER — Inpatient Hospital Stay (HOSPITAL_COMMUNITY): Admission: RE | Admit: 2022-01-30 | Payer: Medicare Other | Source: Ambulatory Visit

## 2022-02-20 ENCOUNTER — Emergency Department (HOSPITAL_COMMUNITY)
Admission: EM | Admit: 2022-02-20 | Discharge: 2022-02-22 | Disposition: A | Discharge status: Home / Self Care | Payer: Medicare Other | Attending: Emergency Medicine | Admitting: Emergency Medicine

## 2022-02-20 ENCOUNTER — Encounter (HOSPITAL_COMMUNITY): Payer: Self-pay

## 2022-02-20 DIAGNOSIS — F332 Major depressive disorder, recurrent severe without psychotic features: Secondary | ICD-10-CM | POA: Diagnosis not present

## 2022-02-20 DIAGNOSIS — F19180 Other psychoactive substance abuse with psychoactive substance-induced anxiety disorder: Secondary | ICD-10-CM | POA: Diagnosis not present

## 2022-02-20 DIAGNOSIS — F19980 Other psychoactive substance use, unspecified with psychoactive substance-induced anxiety disorder: Secondary | ICD-10-CM

## 2022-02-20 DIAGNOSIS — F1290 Cannabis use, unspecified, uncomplicated: Secondary | ICD-10-CM | POA: Insufficient documentation

## 2022-02-20 DIAGNOSIS — R4182 Altered mental status, unspecified: Secondary | ICD-10-CM | POA: Diagnosis not present

## 2022-02-20 DIAGNOSIS — Z79899 Other long term (current) drug therapy: Secondary | ICD-10-CM | POA: Insufficient documentation

## 2022-02-20 DIAGNOSIS — T50904A Poisoning by unspecified drugs, medicaments and biological substances, undetermined, initial encounter: Secondary | ICD-10-CM | POA: Diagnosis not present

## 2022-02-20 DIAGNOSIS — R410 Disorientation, unspecified: Secondary | ICD-10-CM | POA: Insufficient documentation

## 2022-02-20 DIAGNOSIS — R6889 Other general symptoms and signs: Secondary | ICD-10-CM | POA: Diagnosis not present

## 2022-02-20 DIAGNOSIS — T50994A Poisoning by other drugs, medicaments and biological substances, undetermined, initial encounter: Secondary | ICD-10-CM | POA: Diagnosis not present

## 2022-02-20 DIAGNOSIS — Z743 Need for continuous supervision: Secondary | ICD-10-CM | POA: Diagnosis not present

## 2022-02-20 DIAGNOSIS — T50901A Poisoning by unspecified drugs, medicaments and biological substances, accidental (unintentional), initial encounter: Secondary | ICD-10-CM | POA: Insufficient documentation

## 2022-02-20 NOTE — ED Triage Notes (Signed)
Ambulatory to ED after taking muscles relaxers and smoking weed. Has no medical complaints, just says he "feels weird". Pt a/o x 4 on arrival.

## 2022-02-20 NOTE — ED Notes (Addendum)
Pt refusing to go to lobby, stating "I came by the ambulance, I should get a room". This RN explained that there is no room available and that he needed to go to the lobby. Pt reluctant to get into wheelchair. Pt attempted to throw himself on the floor out of wheelchair. Pt placed back into wheelchair and taken to lobby. Pt stating "I can't be out there with them. I need a room". Instructed pt that he needs to stay in the wheelchair in the lobby. If he wants to lay down, he is free to lay down on one of the couches. Pt on the phone with someone saying "is this a money thing?"

## 2022-02-21 ENCOUNTER — Emergency Department (HOSPITAL_COMMUNITY): Payer: Medicare Other

## 2022-02-21 DIAGNOSIS — F332 Major depressive disorder, recurrent severe without psychotic features: Secondary | ICD-10-CM | POA: Insufficient documentation

## 2022-02-21 DIAGNOSIS — T50904A Poisoning by unspecified drugs, medicaments and biological substances, undetermined, initial encounter: Secondary | ICD-10-CM

## 2022-02-21 DIAGNOSIS — T50994A Poisoning by other drugs, medicaments and biological substances, undetermined, initial encounter: Secondary | ICD-10-CM | POA: Diagnosis not present

## 2022-02-21 LAB — I-STAT CHEM 8, ED
BUN: 11 mg/dL (ref 6–20)
Calcium, Ion: 1.16 mmol/L (ref 1.15–1.40)
Chloride: 100 mmol/L (ref 98–111)
Creatinine, Ser: 0.8 mg/dL (ref 0.61–1.24)
Glucose, Bld: 90 mg/dL (ref 70–99)
HCT: 38 % — ABNORMAL LOW (ref 39.0–52.0)
Hemoglobin: 12.9 g/dL — ABNORMAL LOW (ref 13.0–17.0)
Potassium: 3.5 mmol/L (ref 3.5–5.1)
Sodium: 136 mmol/L (ref 135–145)
TCO2: 25 mmol/L (ref 22–32)

## 2022-02-21 LAB — CBC WITH DIFFERENTIAL/PLATELET
Abs Immature Granulocytes: 0.01 10*3/uL (ref 0.00–0.07)
Basophils Absolute: 0 10*3/uL (ref 0.0–0.1)
Basophils Relative: 1 %
Eosinophils Absolute: 0 10*3/uL (ref 0.0–0.5)
Eosinophils Relative: 0 %
HCT: 38.3 % — ABNORMAL LOW (ref 39.0–52.0)
Hemoglobin: 12.9 g/dL — ABNORMAL LOW (ref 13.0–17.0)
Immature Granulocytes: 0 %
Lymphocytes Relative: 15 %
Lymphs Abs: 0.9 10*3/uL (ref 0.7–4.0)
MCH: 30.3 pg (ref 26.0–34.0)
MCHC: 33.7 g/dL (ref 30.0–36.0)
MCV: 89.9 fL (ref 80.0–100.0)
Monocytes Absolute: 0.5 10*3/uL (ref 0.1–1.0)
Monocytes Relative: 8 %
Neutro Abs: 4.3 10*3/uL (ref 1.7–7.7)
Neutrophils Relative %: 76 %
Platelets: 255 10*3/uL (ref 150–400)
RBC: 4.26 MIL/uL (ref 4.22–5.81)
RDW: 12.4 % (ref 11.5–15.5)
WBC: 5.6 10*3/uL (ref 4.0–10.5)
nRBC: 0 % (ref 0.0–0.2)

## 2022-02-21 LAB — COMPREHENSIVE METABOLIC PANEL
ALT: 23 U/L (ref 0–44)
AST: 25 U/L (ref 15–41)
Albumin: 4.5 g/dL (ref 3.5–5.0)
Alkaline Phosphatase: 61 U/L (ref 38–126)
Anion gap: 6 (ref 5–15)
BUN: 12 mg/dL (ref 6–20)
CO2: 24 mmol/L (ref 22–32)
Calcium: 8.6 mg/dL — ABNORMAL LOW (ref 8.9–10.3)
Chloride: 104 mmol/L (ref 98–111)
Creatinine, Ser: 0.8 mg/dL (ref 0.61–1.24)
GFR, Estimated: >60 mL/min (ref >60)
Glucose, Bld: 94 mg/dL (ref 70–99)
Potassium: 3.6 mmol/L (ref 3.5–5.1)
Sodium: 134 mmol/L — ABNORMAL LOW (ref 135–145)
Total Bilirubin: 0.5 mg/dL (ref 0.3–1.2)
Total Protein: 6.6 g/dL (ref 6.5–8.1)

## 2022-02-21 LAB — ACETAMINOPHEN LEVEL: Acetaminophen (Tylenol), Serum: <10 ug/mL — ABNORMAL LOW (ref 10–30)

## 2022-02-21 LAB — RAPID URINE DRUG SCREEN, HOSP PERFORMED
Amphetamines: NOT DETECTED
Barbiturates: NOT DETECTED
Benzodiazepines: NOT DETECTED
Cocaine: NOT DETECTED
Opiates: NOT DETECTED
Tetrahydrocannabinol: POSITIVE — AB

## 2022-02-21 LAB — SALICYLATE LEVEL: Salicylate Lvl: <7 mg/dL — ABNORMAL LOW (ref 7.0–30.0)

## 2022-02-21 LAB — ETHANOL: Alcohol, Ethyl (B): <10 mg/dL (ref <10)

## 2022-02-21 MED ORDER — ONDANSETRON HCL 4 MG/2ML IJ SOLN
4.0000 mg | Freq: Once | INTRAMUSCULAR | Status: AC
  Administered 2022-02-21: 4 mg via INTRAVENOUS
  Filled 2022-02-21: qty 2

## 2022-02-21 MED ORDER — DULOXETINE HCL 20 MG PO CPEP
20.0000 mg | ORAL_CAPSULE | Freq: Two times a day (BID) | ORAL | Status: DC
  Administered 2022-02-21: 20 mg via ORAL
  Filled 2022-02-21 (×3): qty 1

## 2022-02-21 MED ORDER — LORAZEPAM 1 MG PO TABS
1.0000 mg | ORAL_TABLET | Freq: Once | ORAL | Status: AC
  Administered 2022-02-21: 1 mg via ORAL
  Filled 2022-02-21: qty 1

## 2022-02-21 NOTE — ED Notes (Signed)
Urinal at bedside for U/A collection per MD order. [employer]

## 2022-02-21 NOTE — ED Provider Notes (Signed)
Care assumed from Ralene Bathe MD at shift change, please see their note for full detail, but in brief Mike Lowery is a 36 y.o. male presents with over ingestion of baclofen with suspected suicidal intent.  Plan: Po challenge and TTS consult  Physical Exam  BP 124/73   Pulse 70   Temp 98 F (36.7 C) (Oral)   Resp 16   SpO2 97%   Physical Exam  Procedures  Procedures  ED Course / MDM    Medical Decision Making Amount and/or Complexity of Data Reviewed Labs: ordered. Radiology: ordered.  Risk Prescription drug management.   RN notes that family is requesting updates.  I spoke with patient's girlfriend who began to express concern about his mental state.  Patient was crying heavily upon entering the room.  While speaking with girlfriend, TTS entered the room and I exited to allow him to speak with patient.  Per TTS consult, Lindon Romp, NP, he recommends inpatient management for patient.  Patient was very tearful and although denies suicide attempt, became very distressed at the thought of discharge.  States that he does not feel safe to go home.  Ativan ordered per Gwenlyn Found, NP.  I went back to room to finish discussion with patient's girlfriend.  She states that patient has been having a harder time lately.  He used to have be on depressive medications and then discontinued as he was feeling better.  She is unsure of what triggered this episode but she denies previous history of suicidal attempt.  She is reassured that patient will be managed inpatient.  I had nursing tech offer patient snacks in which she was able to tolerate without vomiting or difficulty.       Tonye Pearson, [place] 02/21/22 1036    Quintella Reichert, MD 02/22/22 [phone]

## 2022-02-21 NOTE — Progress Notes (Signed)
Dorian Pod with [facility] hospital contacted CSW in reference to review patient for placement. CSW provided the nurse's contact information for further review.  Glennie Isle, MSW, [facility] Phone: [phone] Disposition/TOC

## 2022-02-21 NOTE — Progress Notes (Signed)
Per Emerson Monte, patient meets criteria for inpatient treatment. There are no available beds at [facility] today. CSW faxed referrals to the following facilities for review:  [facility] [address], [place] [address] [phone] --  [address] [place] [address] [phone] --  [place] [address] [place] [address] [phone] --  [place]  Pending - Request Sent N/A [address] [place] [address] [phone] --  [place] [address] [place] [address] [phone] --  [facility]  Pending - Request Sent N/A [address] [place] [address] [place] [address], [place] [address] [phone] --  [facility]  Pending - Request Sent N/A [address] [place] [address] [phone] --  [facility]  Pending - Request Sent N/A [address] [place] [address] [phone] --  [facility]  Pending - Request Sent N/A [place], [place] [address] [phone] --  [facility]  Pending - Request Sent N/A [address] [place] [address] [phone] --  [facility]  Pending - Request Sent N/A [address], [place] [address] [phone] --  [facility]  Pending - Request Sent N/A [address] [place] [address] [phone] --   TTS will continue to seek bed placement.  Glennie Isle, MSW,  [facility] [place] Phone: [phone] Disposition/TOC

## 2022-02-21 NOTE — Consult Note (Signed)
BH ED ASSESSMENT   Reason for Consult:  OD Referring Physician:  Tilden Fossa, MD Patient Identification: Mike Lowery MRN:  [id number] ED Chief Complaint: MDD (major depressive disorder), recurrent episode, severe (HCC)  Diagnosis:  Principal Problem:   MDD (major depressive disorder), recurrent episode, severe (HCC) Active Problems:   Overdose of undetermined intent   ED Assessment Time Calculation: No data recorded  Subjective:   Mike Lowery is a 36 y.o. male patient with a history or multiple sclerosis, depression, and anxiety who presented to [facility] on 02/20/2022 after overdosing on baclofen 20 mg #5 tablets. Patient denied this was a suicide attempt, but was very tearful and TTS was consulted.  HPI:   Patient reports that he has been feeling more depressed and anxious.  He states that he has only slept approximately 2 hours a day over the last 3 days.  He contributes this to muscle cramps related to multiple sclerosis.  He states that he took extra baclofen to try and relieve the cramping and pain.  When asked if this was a suicide attempt, patient initially states "I do not know."  He becomes extremely tearful and states "I was not trying to kill myself. I do not want to die."  Patient denies a history of suicide attempts.  Patient becomes visibly anxious and tearful anytime thoughts of death or suicide are mentioned, to the point that the assessment has to be paused for the patient to calm down.  It is difficult to obtain a thorough history due to his periods of tearfulness and anxiety.  Patient's girlfriend was at bedside with the patient's expressed permission, states that she does not feel the patient was attempting to kill himself.  She also reports that he has not been sleeping.  She states that he smoked marijuana for the first time in a while and she feels this has contributed to his worsening anxiety and depression.  She reports that they have been together for 5 years.   States that they live in separate houses but spend most of their time together.  Patient reports marijuana use.  States that he did briefly stop and started back yesterday.  He denies use of alcohol, cocaine, methamphetamine, and other illicit substances.  Patient becomes irritable when he is asked about substance use.  Provider explains that these are questions asked of all patients to help [place] provide appropriate care.  Patient reports a history of depression and states that he has previously taking antidepressants.  However, he does not recall the name of the medication.  He states he stopped taking it because he thought he was doing better.  On evaluation, patient is alert and oriented x 4.  Eye contact is minimal.  He is cooperative.  His speech is clear and coherent, decreased in volume.  Mood is depressed, anxious, worthless, hopeless, and irritable.  Affect is congruent with mood and tearful.  Thought process is coherent and linear.  Thought content is logical.  He denies auditory and visual hallucinations.  No indication that he is responding to internal stimuli.  No delusions elicited during this assessment.  Patient denies suicidal ideations.  He denies that the overdose was a suicide attempt.  However, this is not congruent with his affect.  Suspect that the patient may be minimizing.  He denies homicidal ideations.  He denies substance use outside of marijuana.  UDS positive for THC.  BAL less than 10.  When discussing the possibility of discharge, the patient again  becomes extremely tearful and anxious.  He states "please don't send me home.  I feel like I am going crazy."  States that he is unable to contract for safety if discharged home.  He is amenable to inpatient treatment.  Head CT:IMPRESSION: Sequelae of multiple sclerosis as demonstrated on prior MRIs is largely occult by noncontrast CT. No acute intracranial abnormality.  Past Psychiatric History: Depression and anxiety  Risk  to Self or Others: Is the patient at risk to self? Yes Has the patient been a risk to self in the past 6 months? No Has the patient been a risk to self within the distant past? No Is the patient a risk to others? No Has the patient been a risk to others in the past 6 months? No Has the patient been a risk to others within the distant past? No  [place] Scale:  Flowsheet Row IV Medication 240 from 01/26/[facility] INF[facility]ION 120 from 07/28/2021 in [facility] ED to Hosp-Admission (Discharged) from 06/04/2020 in [facility]  C-SSRS RISK CATEGORY No Risk No Risk No Risk       AIMS:  , , ,  ,   ASAM:    Substance Abuse:     Past Medical History:  Past Medical History:  Diagnosis Date   Depression    situaltional   Dyspnea    Multiple sclerosis (HCC) 12/24/13   Multiple sclerosis (HCC)    Vision abnormalities     Past Surgical History:  Procedure Laterality Date   NO PAST SURGERIES     Family History:  Family History  Problem Relation Age of Onset   Healthy Mother    Healthy Father    Cancer Maternal Grandmother        unknown    Ataxia Sister    Multiple sclerosis Neg Hx     Social History:  Social History   Substance and Sexual Activity  Alcohol Use Yes     Social History   Substance and Sexual Activity  Drug Use No   Types: Marijuana   Comment: former    Social History   Socioeconomic History   Marital status: Single    Spouse name: Not on file   Number of children: Not on file   Years of education: Not on file   Highest education level: Not on file  Occupational History   Not on file  Tobacco Use   Smoking status: Former    Types: Cigarettes    Quit date: 04/18/2013    Years since quitting: 8.8   Smokeless tobacco: Former    Quit date: 09/08/2014  Substance and Sexual Activity   Alcohol use: Yes   Drug use: No    Types: Marijuana    Comment: former    Sexual activity: Yes    Partners: Female  Other Topics Concern   Not on file  Social History Narrative   Not on file   Social Determinants of Health   Financial Resource Strain: Not on file  Food Insecurity: Not on file  Transportation Needs: Not on file  Physical Activity: Not on file  Stress: Not on file  Social Connections: Not on file   Additional Social History:    Allergies:  No Known Allergies  Labs:  Results for orders placed or performed during the hospital encounter of 02/20/22 (from the past 48 hour(s))  Comprehensive metabolic panel     Status:  Abnormal   Collection Time: 02/21/22  2:12 AM  Result Value Ref Range   Sodium 134 (L) 135 - 145 mmol/L   Potassium 3.6 3.5 - 5.1 mmol/L   Chloride 104 98 - 111 mmol/L   CO2 24 22 - 32 mmol/L   Glucose, Bld 94 70 - 99 mg/dL    Comment: Glucose reference range applies only to samples taken after fasting for at least 8 hours.   BUN 12 6 - 20 mg/dL   Creatinine, Ser 0.80 0.61 - 1.24 mg/dL   Calcium 8.6 (L) 8.9 - 10.3 mg/dL   Total Protein 6.6 6.5 - 8.1 g/dL   Albumin 4.5 3.5 - 5.0 g/dL   AST 25 15 - 41 U/L   ALT 23 0 - 44 U/L   Alkaline Phosphatase 61 38 - 126 U/L   Total Bilirubin 0.5 0.3 - 1.2 mg/dL   GFR, Estimated >60 >60 mL/min    Comment: (NOTE) Calculated using the CKD-EPI Creatinine Equation (2021)    Anion gap 6 5 - 15    Comment: Performed at [facility], [place] [address] [place], [place] [address]  Ethanol     Status: None   Collection Time: 02/21/22  2:12 AM  Result Value Ref Range   Alcohol, Ethyl (B) <10 <10 mg/dL    Comment: (NOTE) Lowest detectable limit for serum alcohol is 10 mg/dL.  For medical purposes only. Performed at [facility], [place] [address], [place]  [address]   CBC with Differential     Status: Abnormal   Collection Time: 02/21/22  2:12 AM  Result Value Ref Range   WBC 5.6 4.0 - 10.5 K/uL   RBC 4.26 4.22 - 5.81 MIL/uL    Hemoglobin 12.9 (L) 13.0 - 17.0 g/dL   HCT 38.3 (L) 39.0 - 52.0 %   MCV 89.9 80.0 - 100.0 fL   MCH 30.3 26.0 - 34.0 pg   MCHC 33.7 30.0 - 36.0 g/dL   RDW 12.4 11.5 - 15.5 %   Platelets 255 150 - 400 K/uL   nRBC 0.0 0.0 - 0.2 %   Neutrophils Relative % 76 %   Neutro Abs 4.3 1.7 - 7.7 K/uL   Lymphocytes Relative 15 %   Lymphs Abs 0.9 0.7 - 4.0 K/uL   Monocytes Relative 8 %   Monocytes Absolute 0.5 0.1 - 1.0 K/uL   Eosinophils Relative 0 %   Eosinophils Absolute 0.0 0.0 - 0.5 K/uL   Basophils Relative 1 %   Basophils Absolute 0.0 0.0 - 0.1 K/uL   Immature Granulocytes 0 %   Abs Immature Granulocytes 0.01 0.00 - 0.07 K/uL    Comment: Performed at [facility], [place] [address] [place], [place] [address]  Acetaminophen level     Status: Abnormal   Collection Time: 02/21/22  2:12 AM  Result Value Ref Range   Acetaminophen (Tylenol), Serum <10 (L) 10 - 30 ug/mL    Comment: (NOTE) Therapeutic concentrations vary significantly. A range of 10-30 ug/mL  may be an effective concentration for many patients. However, some  are best treated at concentrations outside of this range. Acetaminophen concentrations >150 ug/mL at 4 hours after ingestion  and >50 ug/mL at 12 hours after ingestion are often associated with  toxic reactions.  Performed at [facility], [place] [address], [place], [place] [address]   Salicylate level     Status: Abnormal   Collection Time: 02/21/22  2:12 AM  Result Value Ref Range   Salicylate Lvl <7.0 (L) 7.0 - 30.0 mg/dL    Comment: Performed at [facility], [address] [place], [place] [address]  I-stat chem 8, ED     Status: Abnormal   Collection Time: 02/21/22  2:21 AM  Result Value Ref Range   Sodium 136 135 - 145 mmol/L   Potassium 3.5 3.5 - 5.1 mmol/L   Chloride 100 98 - 111 mmol/L   BUN 11 6 - 20 mg/dL   Creatinine, Ser 1.91 0.61 - 1.24 mg/dL   Glucose, Bld 90 70 - 99 mg/dL    Comment: Glucose  reference range applies only to samples taken after fasting for at least 8 hours.   Calcium, Ion 1.16 1.15 - 1.40 mmol/L   TCO2 25 22 - 32 mmol/L   Hemoglobin 12.9 (L) 13.0 - 17.0 g/dL   HCT 47.8 (L) 29.5 - 62.1 %  Urine rapid drug screen (hosp performed)     Status: Abnormal   Collection Time: 02/21/22  2:49 AM  Result Value Ref Range   Opiates NONE DETECTED NONE DETECTED   Cocaine NONE DETECTED NONE DETECTED   Benzodiazepines NONE DETECTED NONE DETECTED   Amphetamines NONE DETECTED NONE DETECTED   Tetrahydrocannabinol POSITIVE (A) NONE DETECTED   Barbiturates NONE DETECTED NONE DETECTED    Comment: (NOTE) DRUG SCREEN FOR MEDICAL PURPOSES ONLY.  IF CONFIRMATION IS NEEDED FOR ANY PURPOSE, NOTIFY LAB WITHIN 5 DAYS.  LOWEST DETECTABLE LIMITS FOR URINE DRUG SCREEN Drug Class                     Cutoff (ng/mL) Amphetamine and metabolites    1000 Barbiturate and metabolites    200 Benzodiazepine                 200 Tricyclics and metabolites     300 Opiates and metabolites        300 Cocaine and metabolites        300 THC                            50 Performed at [facility], [address] [place], [place] [address]     Current Facility-Administered Medications  Medication Dose Route Frequency Provider Last Rate Last Admin   DULoxetine (CYMBALTA) DR capsule 20 mg  20 mg Oral BID Jackelyn Poling, NP       Current Outpatient Medications  Medication Sig Dispense Refill   amphetamine-dextroamphetamine (ADDERALL XR) 20 MG 24 hr capsule Take 1 capsule (20 mg total) by mouth daily. 30 capsule 0   baclofen (LIORESAL) 20 MG tablet Take 1 tablet (20 mg total) by mouth 3 (three) times daily. 90 tablet 11   gabapentin (NEURONTIN) 600 MG tablet 1/2 to 1 po qHS (Patient taking differently: Take 600 mg by mouth at bedtime.) 90 tablet 3   dalfampridine 10 MG TB12 One po q12 hours (Patient not taking: Reported on 02/21/2022) 60 tablet 5   etodolac (LODINE) 400 MG tablet  Take 1 tablet (400 mg total) by mouth 2 (two) times daily. (Patient not taking: Reported on 02/21/2022) 60 tablet 5   nirmatrelvir/ritonavir EUA (PAXLOVID) 20 x 150 MG & 10 x  TABS Take 3 tablets by mouth 2 (two) times daily. (Patient not taking: Reported on 02/21/2022)     ocrelizumab (OCREVUS) 300 MG/10ML injection HOLD WHILE ACTIVE INFECTION. PLEASE CHECK WITH YOUR NEUROLOGIST  ON WHEN TO RESTART. (Patient not taking: Reported on 02/21/2022) 20 mL 0    Psychiatric Specialty Exam: Presentation  General Appearance:  Casual  Eye Contact: Minimal  Speech: Clear and Coherent  Speech Volume: Decreased  Handedness:No data recorded  Mood and Affect  Mood: Anxious; Depressed; Hopeless; Worthless; Irritable  Affect: Congruent; Tearful; Depressed   Thought Process  Thought Processes: Coherent  Descriptions of Associations:Intact  Orientation:Full (Time, Place and Person)  Thought Content:Logical  History of Schizophrenia/Schizoaffective disorder:No data recorded Duration of Psychotic Symptoms:No data recorded Hallucinations:Hallucinations: None  Ideas of Reference:None  Suicidal Thoughts:Suicidal Thoughts: No  Homicidal Thoughts:Homicidal Thoughts: No   Sensorium  Memory: Immediate Good; Recent Good; Remote Good  Judgment: Impaired  Insight: Lacking   Executive Functions  Concentration: Fair  Attention Span: Fair  Recall: Good  Fund of Knowledge: Good  Language: Fair   Psychomotor Activity  Psychomotor Activity: Psychomotor Activity: Normal   Assets  Assets: Desire for Improvement; Financial Resources/Insurance; Housing    Sleep  Sleep: Sleep: Poor   Physical Exam: Physical Exam Vitals and nursing note reviewed.  Constitutional:      General: He is not in acute distress.    Appearance: He is not ill-appearing, toxic-appearing or diaphoretic.  HENT:     Head: Normocephalic.     Right Ear: External ear normal.     Left Ear:  External ear normal.  Eyes:     General:        Right eye: No discharge.        Left eye: No discharge.  Pulmonary:     Effort: Pulmonary effort is normal. No respiratory distress.  Musculoskeletal:     Cervical back: Normal range of motion.  Neurological:     Mental Status: He is alert and oriented to person, place, and time.  Psychiatric:        Mood and Affect: Mood is anxious and depressed. Affect is tearful.        Behavior: Behavior is cooperative.        Thought Content: Thought content is not paranoid or delusional. Thought content does not include homicidal or suicidal ideation.    Review of Systems  Respiratory:  Negative for cough and shortness of breath.   Cardiovascular:  Negative for chest pain.  Gastrointestinal:  Negative for diarrhea, nausea and vomiting.  Psychiatric/Behavioral:  Positive for depression and substance abuse. Negative for hallucinations. The patient is nervous/anxious and has insomnia.     Blood pressure 117/71, pulse (!) 59, temperature 98 F (36.7 C), temperature source Oral, resp. rate 16, SpO2 98 %. There is no height or weight on file to calculate BMI.  Medical Decision Making: On evaluation, patient is alert and oriented x 4.  Eye contact is minimal.  He is cooperative.  His speech is clear and coherent, decreased in volume.  Mood is depressed, anxious, worthless, hopeless, and irritable.  Affect is congruent with mood and tearful.  Thought process is coherent and linear.  Thought content is logical.  He denies auditory and visual hallucinations.  No indication that he is responding to internal stimuli.  No delusions elicited during this assessment.  Patient denies suicidal ideations.  He denies that the overdose was a suicide attempt.  However, this is not congruent with his affect.  Suspect that the patient may be minimizing.  He denies homicidal ideations.  He denies substance use outside of marijuana.  UDS positive for THC.  BAL less than  10.  When discussing the  possibility of discharge, the patient again becomes extremely tearful and anxious.  He states "please don't send me home.  I feel like I am going crazy."  States that he is unable to contract for safety if discharged home.  He is amenable to inpatient treatment.  Placed order for Ativan oral 1 mg x 1 dose for anxiety  Start duloxetine 20 mg twice daily for depression.  Duloxetine may also have benefits in the treatment of multiple sclerosis symptoms.   Disposition: Recommend psychiatric Inpatient admission when medically cleared.  Jackelyn Poling, NP 02/21/2022 11:16 AM

## 2022-02-21 NOTE — ED Notes (Signed)
Girlfriend Blanchard Kelch [phone]

## 2022-02-21 NOTE — ED Provider Notes (Signed)
[facility] DEPT Provider Note   CSN: [id number] Arrival date & time: 02/20/22  2336     History  Chief Complaint  Patient presents with   Ingestion    Mike Lowery is a 36 y.o. male.  The history is provided by the patient and medical records.  Ingestion  Mike Lowery is a 36 y.o. male who presents to the Emergency Department complaining of overdose.  Level 5 caveat due to confusion.  History is provided by EMS.  Per report he presents for evaluation following overdose on muscle relaxers.  Per report this was possibly baclofen, 5 20 mg tablets.  Patient reported feeling weird at time of ED arrival.  Has a history of MS.     Home Medications Prior to Admission medications   Medication Sig Start Date End Date Taking? Authorizing Provider  amphetamine-dextroamphetamine (ADDERALL XR) 20 MG 24 hr capsule Take 1 capsule (20 mg total) by mouth daily. 12/29/21  Yes Sater, Nanine Means, MD  baclofen (LIORESAL) 20 MG tablet Take 1 tablet (20 mg total) by mouth 3 (three) times daily. 12/23/20  Yes Sater, Nanine Means, MD  gabapentin (NEURONTIN) 600 MG tablet 1/2 to 1 po qHS Patient taking differently: Take 600 mg by mouth at bedtime. 12/29/21  Yes Sater, Nanine Means, MD  dalfampridine 10 MG TB12 One po q12 hours Patient not taking: Reported on 02/21/2022 06/30/21   Lomax, Amy, NP  etodolac (LODINE) 400 MG tablet Take 1 tablet (400 mg total) by mouth 2 (two) times daily. Patient not taking: Reported on 02/21/2022 06/30/21   Debbora Presto, NP  nirmatrelvir/ritonavir EUA (PAXLOVID) 20 x 150 MG & 10 x 100MG  TABS Take 3 tablets by mouth 2 (two) times daily. Patient not taking: Reported on 02/21/2022 07/02/21   Mariel Aloe, MD  ocrelizumab (OCREVUS) 300 MG/10ML injection HOLD WHILE ACTIVE INFECTION. PLEASE CHECK WITH YOUR NEUROLOGIST ON WHEN TO RESTART. Patient not taking: Reported on 02/21/2022 07/02/21   Mariel Aloe, MD      Allergies    Patient has no known  allergies.    Review of Systems   Review of Systems  Unable to perform ROS: Mental status change    Physical Exam Updated Vital Signs BP 124/73   Pulse 70   Temp 98 F (36.7 C) (Oral)   Resp 16   SpO2 97%  Physical Exam Vitals and nursing note reviewed.  Constitutional:      Appearance: He is well-developed.     Comments: Lethargic  HENT:     Head: Normocephalic and atraumatic.  Cardiovascular:     Rate and Rhythm: Normal rate and regular rhythm.     Heart sounds: No murmur heard. Pulmonary:     Effort: Pulmonary effort is normal. No respiratory distress.     Breath sounds: Normal breath sounds.  Abdominal:     Palpations: Abdomen is soft.     Tenderness: There is no abdominal tenderness. There is no guarding or rebound.  Musculoskeletal:        General: No tenderness.  Skin:    General: Skin is warm and dry.  Neurological:     Comments: Pupils dilated and reactive bilaterally.  Responds and localizes to painful stimuli.  Spontaneous movement of the right upper and right lower extremity.  Psychiatric:     Comments: Unable to assess     ED Results / Procedures / Treatments   Labs (all labs ordered are listed, but only abnormal results are  displayed) Labs Reviewed  COMPREHENSIVE METABOLIC PANEL - Abnormal; Notable for the following components:      Result Value   Sodium 134 (*)    Calcium 8.6 (*)    All other components within normal limits  CBC WITH DIFFERENTIAL/PLATELET - Abnormal; Notable for the following components:   Hemoglobin 12.9 (*)    HCT 38.3 (*)    All other components within normal limits  RAPID URINE DRUG SCREEN, HOSP PERFORMED - Abnormal; Notable for the following components:   Tetrahydrocannabinol POSITIVE (*)    All other components within normal limits  ACETAMINOPHEN LEVEL - Abnormal; Notable for the following components:   Acetaminophen (Tylenol), Serum <10 (*)    All other components within normal limits  SALICYLATE LEVEL - Abnormal;  Notable for the following components:   Salicylate Lvl <7.1 (*)    All other components within normal limits  I-STAT CHEM 8, ED - Abnormal; Notable for the following components:   Hemoglobin 12.9 (*)    HCT 38.0 (*)    All other components within normal limits  ETHANOL  CBG MONITORING, ED    EKG None  Radiology CT Head Wo Contrast  Result Date: 02/21/2022 CLINICAL DATA:  36 year old male with altered mental status after drug use. History of multiple sclerosis. EXAM: CT HEAD WITHOUT CONTRAST TECHNIQUE: Contiguous axial images were obtained from the base of the skull through the vertex without intravenous contrast. RADIATION DOSE REDUCTION: This exam was performed according to the departmental dose-optimization program which includes automated exposure control, adjustment of the mA and/or kV according to patient size and/or use of iterative reconstruction technique. COMPARISON:  Brain MRI 01/09/2022 and earlier. FINDINGS: Brain: There is a spiraling hypodense CT artifact to the right of midline throughout these images incidentally noted. Cerebral volume is stable since last year. Widely scattered cerebral white matter and occasional deep gray nuclei and brainstem lesions are largely occult by CT. No midline shift, ventriculomegaly, mass effect, evidence of mass lesion, intracranial hemorrhage or evidence of cortically based acute infarction. Vascular: No suspicious intracranial vascular hyperdensity. Skull: Negative. Sinuses/Orbits: Visualized paranasal sinuses and mastoids are stable and well aerated. Other: Visualized orbits and scalp soft tissues are within normal limits. IMPRESSION: Sequelae of multiple sclerosis as demonstrated on prior MRIs is largely occult by noncontrast CT. No acute intracranial abnormality. Electronically Signed   By: Genevie Ann M.D.   On: 02/21/2022 05:44    Procedures Procedures    Medications Ordered in ED Medications  ondansetron (ZOFRAN) injection 4 mg (4 mg  Intravenous Given 02/21/22 0626)    ED Course/ Medical Decision Making/ A&P                           Medical Decision Making Amount and/or Complexity of Data Reviewed Labs: ordered. Radiology: ordered.  Risk Prescription drug management.   Patient with history of MS here for evaluation following overdose per report.  Patient unresponsive at time of ED evaluation.  Per report he took baclofen, is prescribed this but took more than his usual dose.  Per poison control recommendations he was observed for several hours in the emergency department.  He did initially have some emesis during his ED stay, resolved after ondansetron administration.  On repeat evaluation patient is awake, alert and tearful.  He has 5 out of 5 grip strength bilaterally.  He does have some chronic left lower extremity weakness that he states is due to his MS.  CT head  is negative for acute abnormality.  When patient awakens he is tearful and states he does not want to die.  He denies any SI or attempt at self-harm but cannot state why he took the baclofen.  Given patient's affect TTS consult placed.  Patient care transferred pending TTS consult.        Final Clinical Impression(s) / ED Diagnoses Final diagnoses:  None    Rx / DC Orders ED Discharge Orders     None         Quintella Reichert, MD 02/21/22 [phone]

## 2022-02-21 NOTE — ED Notes (Signed)
Patient to room  27.  Patient drowsy.  Patient sleeping, breathes equal and unlabored.

## 2022-02-22 DIAGNOSIS — F332 Major depressive disorder, recurrent severe without psychotic features: Secondary | ICD-10-CM

## 2022-02-22 DIAGNOSIS — F19980 Other psychoactive substance use, unspecified with psychoactive substance-induced anxiety disorder: Secondary | ICD-10-CM

## 2022-02-22 DIAGNOSIS — T50904A Poisoning by unspecified drugs, medicaments and biological substances, undetermined, initial encounter: Secondary | ICD-10-CM

## 2022-02-22 DIAGNOSIS — T50994A Poisoning by other drugs, medicaments and biological substances, undetermined, initial encounter: Secondary | ICD-10-CM | POA: Diagnosis not present

## 2022-02-22 DIAGNOSIS — F1290 Cannabis use, unspecified, uncomplicated: Secondary | ICD-10-CM

## 2022-02-22 NOTE — Discharge Summary (Signed)
[facility] Psych ED Discharge  02/22/2022 10:43 AM Mike Lowery  MRN:  [id number]  Principal Problem: Substance-induced anxiety disorder [facility]) Discharge Diagnoses: Principal Problem:   Substance-induced anxiety disorder (HCC) Active Problems:   Overdose of undetermined intent   Marijuana use  Clinical Impression:  Final diagnoses:  Overdose of undetermined intent, initial encounter  Marijuana use  Substance-induced anxiety disorder [facility])    ED Assessment Time Calculation: Start Time: 1000 Stop Time: 1020 Total Time in Minutes (Assessment Completion): 20  Subjective:   Mike Lowery is a 36 y.o. male patient with a history or multiple sclerosis, depression, and anxiety who presented to [facility] on 02/20/2022 after overdosing on baclofen 20 mg #5 tablets. Patient denied this was a suicide attempt, but was very tearful and TTS was consulted.   Patient adamantly denies that the OD was a suicide attempt. States he took extra for relief of muscle cramps. States "If I wanted to kill myself I would have taken the entire bottle and not just 5 pills."Patient reports a history of daily marijuana use (multiple times daily). States that he did briefly stop and started back on Friday. He reports that he had a similar episode in the past while using marijuana.  He denies use of alcohol, cocaine, methamphetamine, and other illicit substances.  He reports some discord with his girlfriend and states that he feels that this also contributed to his anxiety and tearfulness yesterday.  Patient reports that he slept really well yesterday and through the night.  Discussed the benefits of duloxetine with patient in regards to depression/anxiety and multiple sclerosis symptoms.  Patient states that he does not want to take any additional medications.  On evaluation today, the patient is sitting up in bed.  He is alert and oriented x 4.  He is calm and cooperative.  He is neatly groomed.  Eye contact is good.  Speech is  clear and coherent.  He reports his mood is euthymic.  Affect appears much brighter today.  He is smiling and laughing appropriately.  Thought process is coherent, goal-directed, and linear.  Thought content is logical.  He denies auditory and visual hallucinations.  No indication that he is responding to internal stimuli.  No delusions elicited during this assessment.  He denies suicidal ideations.  He denies homicidal ideations.  He denies substance abuse outside of marijuana use.  Patient states that he can contract for safety if discharged home.  Patient's girlfriend, Lazarus Gowda [phone], denies any concerns for the patient's safety.  States she does not feel that this was a suicide attempt.  Denies hearing the patient making any suicidal statements.  Past Psychiatric History: Depression and anxiety  Past Medical History:  Past Medical History:  Diagnosis Date   Depression    situaltional   Dyspnea    Multiple sclerosis (HCC) 12/24/13   Multiple sclerosis (HCC)    Vision abnormalities     Past Surgical History:  Procedure Laterality Date   NO PAST SURGERIES     Family History:  Family History  Problem Relation Age of Onset   Healthy Mother    Healthy Father    Cancer Maternal Grandmother        unknown    Ataxia Sister    Multiple sclerosis Neg Hx     Social History:  Social History   Substance and Sexual Activity  Alcohol Use Yes     Social History   Substance and Sexual Activity  Drug Use No   Types:  Marijuana   Comment: former    Social History   Socioeconomic History   Marital status: Single    Spouse name: Not on file   Number of children: Not on file   Years of education: Not on file   Highest education level: Not on file  Occupational History   Not on file  Tobacco Use   Smoking status: Former    Types: Cigarettes    Quit date: 04/18/2013    Years since quitting: 8.8   Smokeless tobacco: Former    Quit date: 09/08/2014  Substance and Sexual  Activity   Alcohol use: Yes   Drug use: No    Types: Marijuana    Comment: former   Sexual activity: Yes    Partners: Female  Other Topics Concern   Not on file  Social History Narrative   Not on file   Social Determinants of Health   Financial Resource Strain: Not on file  Food Insecurity: Not on file  Transportation Needs: Not on file  Physical Activity: Not on file  Stress: Not on file  Social Connections: Not on file    Tobacco Cessation:  A prescription for an FDA-approved tobacco cessation medication was offered at discharge and the patient refused  Current Medications: Current Facility-Administered Medications  Medication Dose Route Frequency Provider Last Rate Last Admin   DULoxetine (CYMBALTA) DR capsule 20 mg  20 mg Oral BID Nira Conn A, NP   20 mg at 02/21/22 1828   Current Outpatient Medications  Medication Sig Dispense Refill   amphetamine-dextroamphetamine (ADDERALL XR) 20 MG 24 hr capsule Take 1 capsule (20 mg total) by mouth daily. 30 capsule 0   baclofen (LIORESAL) 20 MG tablet Take 1 tablet (20 mg total) by mouth 3 (three) times daily. 90 tablet 11   gabapentin (NEURONTIN) 600 MG tablet 1/2 to 1 po qHS (Patient taking differently: Take 600 mg by mouth at bedtime.) 90 tablet 3   dalfampridine 10 MG TB12 One po q12 hours (Patient not taking: Reported on 02/21/2022) 60 tablet 5   etodolac (LODINE) 400 MG tablet Take 1 tablet (400 mg total) by mouth 2 (two) times daily. (Patient not taking: Reported on 02/21/2022) 60 tablet 5   nirmatrelvir/ritonavir EUA (PAXLOVID) 20 x 150 MG & 10 x 100MG  TABS Take 3 tablets by mouth 2 (two) times daily. (Patient not taking: Reported on 02/21/2022)     ocrelizumab (OCREVUS) 300 MG/10ML injection HOLD WHILE ACTIVE INFECTION. PLEASE CHECK WITH YOUR NEUROLOGIST ON WHEN TO RESTART. (Patient not taking: Reported on 02/21/2022) 20 mL 0   PTA Medications: (Not in a hospital admission)   04/23/2022 Scale:  Flowsheet Row IV Medication  240 from 01/26/2022 in [facility] IN[facility]USION CENTER INFUSION 120 from 07/28/2021 in [place] [facility] INFUSION CENTER ED to Hosp-Admission (Discharged) from 06/04/2020 in [place] 5W Medical Specialty PCU  C-SSRS RISK CATEGORY No Risk No Risk No Risk       Psychiatric Specialty Exam: Presentation  General Appearance:  Casual  Eye Contact: Good  Speech: Clear and Coherent; Normal Rate  Speech Volume: Normal  Handedness:No data recorded  Mood and Affect  Mood: Euthymic  Affect: Congruent   Thought Process  Thought Processes: Coherent; Goal Directed; Linear  Descriptions of Associations:Intact  Orientation:Full (Time, Place and Person)  Thought Content:Logical  History of Schizophrenia/Schizoaffective disorder:No data recorded Duration of Psychotic Symptoms:No data recorded Hallucinations:Hallucinations: None  Ideas of Reference:None  Suicidal Thoughts:Suicidal Thoughts: No  Homicidal Thoughts:Homicidal Thoughts:  No   Sensorium  Memory: Immediate Good; Recent Good; Remote Good  Judgment: Good  Insight: Good   Executive Functions  Concentration: Good  Attention Span: Good  Recall: Good  Fund of Knowledge: Good  Language: Good   Psychomotor Activity  Psychomotor Activity: Psychomotor Activity: Normal   Assets  Assets: Communication Skills; Desire for Improvement; Financial Resources/Insurance; Housing; Social Support   Sleep  Sleep: Sleep: Good    Physical Exam: Physical Exam Constitutional:      General: He is not in acute distress.    Appearance: He is not ill-appearing, toxic-appearing or diaphoretic.  HENT:     Right Ear: External ear normal.     Left Ear: External ear normal.  Eyes:     General:        Right eye: No discharge.        Left eye: No discharge.  Cardiovascular:     Rate and Rhythm: Normal rate.  Pulmonary:     Effort: Pulmonary effort is normal. No respiratory distress.   Musculoskeletal:        General: Normal range of motion.     Cervical back: Normal range of motion.  Neurological:     Mental Status: He is alert and oriented to person, place, and time.  Psychiatric:        Thought Content: Thought content is not paranoid or delusional. Thought content does not include homicidal or suicidal ideation.    Review of Systems  Constitutional:  Negative for chills, diaphoresis, fever, malaise/fatigue and weight loss.  Respiratory:  Negative for cough and shortness of breath.   Cardiovascular:  Negative for chest pain.  Gastrointestinal:  Negative for diarrhea, nausea and vomiting.  Neurological:  Negative for dizziness and seizures.  Psychiatric/Behavioral:  Positive for substance abuse. Negative for depression, hallucinations, memory loss and suicidal ideas. The patient has insomnia. The patient is not nervous/anxious.    Blood pressure 123/74, pulse 65, temperature 97.6 F (36.4 C), temperature source Oral, resp. rate 18, SpO2 96 %. There is no height or weight on file to calculate BMI.   Demographic Factors:  Male and Living alone  Loss Factors: NA  Historical Factors: Family history of mental illness or substance abuse  Risk Reduction Factors:   Sense of responsibility to family, Religious beliefs about death, Employed, and Positive social support  Continued Clinical Symptoms:  Previous Psychiatric Diagnoses and Treatments Medical Diagnoses and Treatments/Surgeries  Cognitive Features That Contribute To Risk:  None    Suicide Risk:  Minimal: No identifiable suicidal ideation.  Patients presenting with no risk factors but with morbid ruminations; may be classified as minimal risk based on the severity of the depressive symptoms   Follow-up Information     [facility] Follow up.   Specialty: Urgent Care Why: Behavioral Health Urgent Care is open 24 hours/7 days a week for mental health urgent care  needs. Contact information: [address] [place] [address] [phone]        Asa Lente, MD Follow up.   Specialty: Neurology Why: Keep appointments Contact information: [address] [place] [address] [phone]                 Medical Decision Making: At time of discharge, patient denies SI, HI, AVH and is able to contract for safety. He demonstrated no overt evidence of psychosis or mania. Prior to discharge, Yareth verbalized that he understood warning signs, triggers, and symptoms of worsening mental health  and how to access emergency mental health care if they felt it was needed. Patient was instructed to call 911, go to the behavioral health urgent care, or return to the emergency room if they experienced any concerning symptoms after discharge.  Patient was also provided resources for suicide hotlines.  Patient voiced understanding and agreed to this.    Disposition: No evidence of imminent risk to self or others at present.   Patient does not meet criteria for psychiatric inpatient admission. Supportive therapy provided about ongoing stressors. Discussed crisis plan, support from social network, calling 911, coming to the Emergency Department, and calling Suicide Hotline.     Discharge Instructions       Discharge recommendations:  Patient is to take medications as prescribed. Please see information for follow-up appointment with psychiatry and therapy. Please follow up with your primary care provider for all medical related needs.   Therapy: We recommend that patient participate in individual therapy to address mental health concerns.  Medications: The parent/guardian is to contact a medical professional and/or outpatient provider to address any new side effects that develop. Parent/guardian should update outpatient providers of any new medications and/or medication changes.   Safety:  The patient should abstain from use of  illicit substances/drugs and abuse of any medications. If symptoms worsen or do not continue to improve or if the patient becomes actively suicidal or homicidal then it is recommended that the patient return to the closest hospital emergency department, the [facility], or call 911 for further evaluation and treatment. National Suicide Prevention Lifeline 1-800-SUICIDE or 306-577-5324.  About 988 988 offers 24/7 access to trained crisis counselors who can help people experiencing mental health-related distress. People can call or text 988 or chat 988lifeline.org for themselves or if they are worried about a loved one who may need crisis support.  Crisis Mobile: [facility]:                     [phone] (for crisis response 24 hours a day) [place]:                                            [phone]       Rozetta Nunnery, NP 02/22/2022, 10:43 AM

## 2022-02-22 NOTE — ED Provider Notes (Signed)
Emergency Medicine Observation Re-evaluation Note  Mike Lowery is a 36 y.o. male, seen on rounds today.  Pt initially presented to the ED for complaints of Ingestion Currently, the patient is sleeping.  Physical Exam  BP 123/74 (BP Location: Right Arm)   Pulse 65   Temp 97.6 F (36.4 C) (Oral)   Resp 18   SpO2 96%  Physical Exam General: No acute distress Cardiac: Well-perfused Lungs: Nonlabored Psych: Cooperative  ED Course / MDM  EKG:   I have reviewed the labs performed to date as well as medications administered while in observation.  Recent changes in the last 24 hours include psychiatric assessment.  Plan  Current plan is for inpatient psychiatric admission.  10:45 AM.  Informed by behavioral health that they have psychiatrically cleared patient.  They have put resources to the AVS for patient.    Hayden Rasmussen, MD 02/22/22 1743

## 2022-02-22 NOTE — Discharge Instructions (Addendum)
Discharge recommendations:  Patient is to take medications as prescribed. Please see information for follow-up appointment with psychiatry and therapy. Please follow up with your primary care provider for all medical related needs.   Therapy: We recommend that patient participate in individual therapy to address mental health concerns.  Medications: The parent/guardian is to contact a medical professional and/or outpatient provider to address any new side effects that develop. Parent/guardian should update outpatient providers of any new medications and/or medication changes.   Safety:  The patient should abstain from use of illicit substances/drugs and abuse of any medications. If symptoms worsen or do not continue to improve or if the patient becomes actively suicidal or homicidal then it is recommended that the patient return to the closest hospital emergency department, the [facility], or call 911 for further evaluation and treatment. National Suicide Prevention Lifeline 1-800-SUICIDE or 908-192-0456.  About 988 988 offers 24/7 access to trained crisis counselors who can help people experiencing mental health-related distress. People can call or text 988 or chat 988lifeline.org for themselves or if they are worried about a loved one who may need crisis support.  Crisis Mobile: [facility]:                     2203080253 (for crisis response 24 hours a day) [facility] Hotline:                                            301-285-8594    Outpatient Psychiatry and Counseling   [facility]: Mobile Crisis Management 24 hours:  [phone]   [facility] of the [employer] sliding scale fee and walk in schedule: M-F 8am-12pm/1pm-3pm [address]  [place], [place] [address] [phone]   [facility] [address] [place], [place] [address] [phone]   [facility] (Formerly known as [facility] [employer])- new patient walk-in appointments available Monday - Friday 8am -3pm.          [address] [place], [place] [address] [phone] or crisis line- [phone]   [facility] Services/ Intensive Outpatient Therapy Program [address] [place], [place] [address] [phone]   [facility]                                                 Crisis Services                                                             [phone] [address]  [place], [place] [address]                                                 [facility] [phone]. [place], [place] [address]     [employer] of Care                                                                                                             [address]  [place], [place] [address]                                                           [phone]   [place], [place], [place] [address] [phone]   [facility]                                   [address]                            [place], [place] [address]                                               [place], [place] [address]                                                [phone]  [facility] [place] [address]   [facility]                                               [address], [place]                                                     [place], [place], [place] [address] [place]                                               [address]                                                [place], [place] [address]                                               [phone]

## 2022-02-23 DIAGNOSIS — H5213 Myopia, bilateral: Secondary | ICD-10-CM | POA: Diagnosis not present

## 2022-02-23 DIAGNOSIS — H40033 Anatomical narrow angle, bilateral: Secondary | ICD-10-CM | POA: Diagnosis not present

## 2022-02-23 NOTE — ED Notes (Signed)
Opened chart at pts request to print work note. 

## 2022-03-02 ENCOUNTER — Telehealth: Payer: Self-pay

## 2022-03-02 NOTE — Telephone Encounter (Signed)
        Patient  visited [place] on 10/8   Telephone encounter attempt :  1st  A HIPAA compliant voice message was left requesting a return call.  Instructed patient to call back    Tiaja Hagan [facility] Care Guide, [place], Care Management  [phone] [address], , [place] [address] Phone: [phone] Email: [email or web address]       

## 2022-03-03 ENCOUNTER — Telehealth: Payer: Self-pay

## 2022-03-03 NOTE — Telephone Encounter (Signed)
        Patient  visited [place] on 10/8    Telephone encounter attempt :  2nd  A HIPAA compliant voice message was left requesting a return call.  Instructed patient to call back    [place], [place] Management  [phone] [address], [place], [place] [address] Phone: [phone] Email: [email or web address]

## 2022-03-19 ENCOUNTER — Encounter: Payer: Self-pay | Admitting: Neurology

## 2022-03-19 ENCOUNTER — Ambulatory Visit (INDEPENDENT_AMBULATORY_CARE_PROVIDER_SITE_OTHER): Payer: Medicare Other | Admitting: Neurology

## 2022-03-19 VITALS — BP 113/65 | Ht 66.0 in | Wt 143.5 lb

## 2022-03-19 DIAGNOSIS — R252 Cramp and spasm: Secondary | ICD-10-CM | POA: Diagnosis not present

## 2022-03-19 DIAGNOSIS — D849 Immunodeficiency, unspecified: Secondary | ICD-10-CM

## 2022-03-19 DIAGNOSIS — G35 Multiple sclerosis: Secondary | ICD-10-CM | POA: Diagnosis not present

## 2022-03-19 DIAGNOSIS — R269 Unspecified abnormalities of gait and mobility: Secondary | ICD-10-CM

## 2022-03-19 DIAGNOSIS — F19980 Other psychoactive substance use, unspecified with psychoactive substance-induced anxiety disorder: Secondary | ICD-10-CM

## 2022-03-19 DIAGNOSIS — Z79899 Other long term (current) drug therapy: Secondary | ICD-10-CM

## 2022-03-19 MED ORDER — BACLOFEN 20 MG PO TABS: 20.0000 mg | ORAL_TABLET | Freq: Three times a day (TID) | ORAL | 11 refills | Status: DC

## 2022-03-19 MED ORDER — AMPHETAMINE-DEXTROAMPHET ER 20 MG PO CP24: 20.0000 mg | ORAL_CAPSULE | Freq: Every day | ORAL | 0 refills | Status: DC

## 2022-03-19 MED ORDER — GABAPENTIN 600 MG PO TABS: ORAL_TABLET | ORAL | 11 refills | Status: DC

## 2022-03-19 NOTE — Progress Notes (Signed)
[facility]  PATIENT: Mike Lowery DOB: 16-Mar-1986  REFERRING DOCTOR OR PCP:  Dr. Doreene Burke SOURCE: Patient, family, records in the EMR, MRI images on PACS  _________________________________   HISTORICAL  CHIEF COMPLAINT:  Chief Complaint  Patient presents with   Follow-up    Pt in room #2 with is mother. Pt here today for f/u on his MS.    HISTORY OF PRESENT ILLNESS:  Mike Lowery,is a 36 y.o. man with relapsing remitting multiple sclerosis.    Update 03/19/2022: He is on Ocrevus and tolerates it well.   His last infusion was 12/2021    He has not had any exacerbation while on it.      IgM and IgA were reduced at last visit.  IgM was 14.  IgG was normal.  MRI of the brain 01/09/2022 showed no new lesions.  Last month he had an episode with a panic attack.  He had more stress and smoked pot leading to severe paranoia - he felt he was melting away.  He also took multiple baclofen (5 x 20 in one hour due to spasms).   He has had  2 other similar episodes (this was 3rd time he had a lot of paranoia - also smoked with those).     His gait is about the same as last year.   He falls and trips frequently.   He does not use a cane.     He is uncertain if dalfampridine has helped has not taken regularly.  He has noted spasticity mostly in his left leg.   Left leg is weaker and feels heavy.Arms are strong.     He has pain,  numbness and tingling in the left leg.   Marland KitchenHe take baclofen 10 mg po  He has mild urinary hesitancy that is stable.    Vision is doing well.  He notes mild fatigue that fluctuates a lot.  He avoids going outside in the heat.   Due to dysesthesias he sometimes sleeps poorly- sleep mainenance worse than onset.    He has mild  depression but no anxiety.   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   MS HISTORY:  In July/Aug 2015, he had the onset of weakness and  numbness in the arms and legs, a little worse on the left.   When symptoms persisted, he presented to [facility] emergency room. MRI was consistent with multiple sclerosis and he was admitted. The MRI of the brain showed several plaques, mostly in the periventricular white matter. The MRI of the cervical spine showed several large T2 hyperintense foci, with some enhancement. He was given IV Solu-Medrol and his symptoms improved quite a bit. He followed up with Dr. Tomi Likens and was placed on Plegridy about 8 or 9 months after starting Plegridy, he had another exacerbation and MRI of the brain and cervical spine were performed again 02/21/2015. The MRI of the cervical spine does not show any definite new lesions. However, the MRI of the brain does show several new lesions including for small enhancing foci. He was switched to [facility] November or December 2016.   I have personally reviewed the MRIs of the brain and cervical spine from October 2016. The MRI of the brain shows multiple T2/FLAIR hyperintense foci consistent with MS in the hemispheres and brainstem.   4 of the hemispheric  small foci enhanced after gadolinium administration. The MRI of the cervical spine shows multiple hyperintense foci. There was a normal enhancement pattern.   He had a large exacerbation while on Tecfidera with new gait difficulties March 2018.   MRI's show multiple new lesions in the brain with several lesions enhancing including a larger one in the left periventrivcular white matter.    Also has a new focus adjacent to C4 in the spinal cord.  He was switched to ocrelizumab and had his first infusion at the end of March 2018.     MRI images MRI of the brain 06/04/2020 showed T2/flair hyperintense foci in the brainstem, left cerebellar hemisphere, thalamus and the hemispheres.  None of the foci enhanced.  No changes compared to the 12/28/2019 MRI.  MRI of the cervical spine 06/04/2020 and 07/16/2016 showed multiple T2 hyperintense foci  within the spinal cord.  They are somewhat confluent and primarily located posteriorly adjacent to C2, posteriorly and more to the left adjacent to C3-C4, posteriorly more to the right adjacent to C4-C5, posteriorly adjacent to C5-C6, more to the left adjacent to C6-C7 and posteriorly adjacent to C7-T1, adjacent to T1.  No enhancing lesions.  No change between 2018 and 2022.  MRI of the thoracic spine 06/04/2020 showed multiple T2 hyperintense foci within the spinal cord, no comparison films.  Number the foci appear to be acute.  They do not enhance.  MRI of the brain 12/28/2019 showed multiple T2/FLAIR hyperintense foci in the brainstem, left cerebellar hemisphere, left thalamus and in the periventricular, juxtacortical and deep white matter both hemispheres in a pattern and configuration consistent with chronic demyelinating plaque associated with multiple sclerosis.  None of the foci appear to be acute and they do not enhance.  Compared to the MRI from 01/01/2018, there are no new lesions.  Normal enhancement pattern and no acute findings  MRI of the brain 01/10/2022 shows T2/FLAIR hyperintense foci in the brainstem, cerebellum, left thalamus and hemispheres in a pattern and configuration consistent with chronic demyelinating plaque associated with multiple sclerosis.  None of the foci appear to be acute.  They do not enhance.  Compared to the MRI from 06/04/2020, there are no new lesions.   REVIEW OF SYSTEMS: Constitutional: No fevers, chills, sweats, or change in appetite.   He has fatigue and sleepiness. Eyes: No visual changes, double vision, eye pain Ear, nose and throat: No hearing loss, ear pain, nasal congestion, sore throat Cardiovascular: No chest pain, palpitations Respiratory:  No shortness of breath at rest or with exertion.   No wheezes GastrointestinaI: No nausea, vomiting, diarrhea, abdominal pain, fecal incontinence Genitourinary:  He notes urinary frequency and ED.    Musculoskeletal:   No neck pain, back pain Integumentary: No rash, pruritus, skin lesions Neurological: as above Psychiatric: See above Endocrine: No palpitations, diaphoresis, change in appetite, change in weigh or increased thirst Hematologic/Lymphatic:  No anemia, purpura, petechiae. Allergic/Immunologic: No itchy/runny eyes, nasal congestion, recent allergic reactions, rashes  ALLERGIES: No Known Allergies  HOME MEDICATIONS:  Current Outpatient Medications:    nirmatrelvir/ritonavir EUA (PAXLOVID) 20 x 150 MG & 10 x 100MG  TABS, Take 3 tablets by mouth 2 (two) times daily., Disp: , Rfl:    ocrelizumab (OCREVUS) 300 MG/10ML injection, HOLD WHILE ACTIVE INFECTION. PLEASE CHECK WITH YOUR NEUROLOGIST ON WHEN TO RESTART., Disp: 20 mL, Rfl: 0   amphetamine-dextroamphetamine (ADDERALL XR) 20 MG 24 hr capsule, Take 1 capsule (20 mg total) by mouth daily., Disp: 30 capsule, Rfl: 0  baclofen (LIORESAL) 20 MG tablet, Take 1 tablet (20 mg total) by mouth 3 (three) times daily., Disp: 90 tablet, Rfl: 11   gabapentin (NEURONTIN) 600 MG tablet, 1/2 to 1 po tid, Disp: 90 tablet, Rfl: 11  PAST MEDICAL HISTORY: Past Medical History:  Diagnosis Date   Depression    situaltional   Dyspnea    Multiple sclerosis ([place]) 12/24/13   Multiple sclerosis ([place])    Vision abnormalities     PAST SURGICAL HISTORY: Past Surgical History:  Procedure Laterality Date   NO PAST SURGERIES      FAMILY HISTORY: Family History  Problem Relation Age of Onset   Healthy Mother    Healthy Father    Cancer Maternal Grandmother        unknown    Ataxia Sister    Multiple sclerosis Neg Hx     SOCIAL HISTORY:  Social History   Socioeconomic History   Marital status: Single    Spouse name: Not on file   Number of children: Not on file   Years of education: Not on file   Highest education level: Not on file  Occupational History   Not on file  Tobacco Use   Smoking status: Former    Types: Cigarettes    Quit date:  04/18/2013    Years since quitting: 8.9   Smokeless tobacco: Former    Quit date: 09/08/2014  Substance and Sexual Activity   Alcohol use: Yes   Drug use: No    Types: Marijuana    Comment: former   Sexual activity: Yes    Partners: Female  Other Topics Concern   Not on file  Social History Narrative   Not on file   Social Determinants of Health   Financial Resource Strain: Not on file  Food Insecurity: Not on file  Transportation Needs: Not on file  Physical Activity: Not on file  Stress: Not on file  Social Connections: Not on file  Intimate Partner Violence: Not on file     PHYSICAL EXAM  Vitals:   03/19/22 1603  BP: 113/65  Weight: 143 lb 8 oz (65.1 kg)  Height: 5\' 6"  (1.676 m)     Body mass index is 23.16 kg/m.   General: The patient is well-developed and well-nourished and in no acute distress   Skin: Extremities are without rash or edema.  Neurologic Exam  Mental status: The patient is alert and oriented x 3 at the time of the examination. The patient has apparent normal recent and remote memory, with a reduced attention span and concentration ability.   Speech is normal.  Cranial nerves: Extraocular movements are full.  Colors are desaturated OD relative to OS. Facial strength is normal.  Trapezius strength is normal..   No obvious hearing deficits are noted.  Motor:   Muscle bulk is normal.   He has increased muscle tone in the legs.  Strength is 5/5.  Sensory:  He reports normal and symmetric sensation to touch and vibration in the arms or legs..  Coordination: On cerebellar testing finger-nose-finger is normal.  Heel-to-shin is mildly reduced.  Gait and station: Station is normal.   The gait is wide and spastic (left) after walking 50 feet he had a slight foot drop on the left.  The tandem gait is wide.  Romberg is borderline.   Reflexes: Deep tendon reflexes are symmetric and 3 in arms.  He has increased reflexes in the legs but no ankle clonus.  DIAGNOSTIC DATA (LABS, IMAGING, TESTING) - I reviewed patient records, labs, notes, testing and imaging myself where available.  Lab Results  Component Value Date   WBC 5.6 02/21/2022   HGB 12.9 (L) 02/21/2022   HCT 38.0 (L) 02/21/2022   MCV 89.9 02/21/2022   PLT 255 02/21/2022      Component Value Date/Time   NA 136 02/21/2022 0221   NA 142 12/29/2021 1104   K 3.5 02/21/2022 0221   CL 100 02/21/2022 0221   CO2 24 02/21/2022 0212   GLUCOSE 90 02/21/2022 0221   BUN 11 02/21/2022 0221   BUN 9 12/29/2021 1104   CREATININE 0.80 02/21/2022 0221   CREATININE 0.92 03/27/2015 1130   CALCIUM 8.6 (L) 02/21/2022 0212   PROT 6.6 02/21/2022 0212   PROT 6.4 12/29/2021 1104   ALBUMIN 4.5 02/21/2022 0212   ALBUMIN 4.7 12/29/2021 1104   AST 25 02/21/2022 0212   ALT 23 02/21/2022 0212   ALKPHOS 61 02/21/2022 0212   BILITOT 0.5 02/21/2022 0212   BILITOT 0.6 12/29/2021 1104   GFRNONAA >60 02/21/2022 0212   GFRNONAA >89 10/10/2014 1251   GFRAA 103 06/22/2019 0850   GFRAA >89 10/10/2014 1251       ASSESSMENT AND PLAN    1. Multiple sclerosis ([place])   2. High risk medication use   3. Gait disturbance   4. Spasticity   5. Immunosuppression ([place])   6. Substance-induced anxiety disorder ([place])      1.   Continue ocrelizumab for MS. check IgG/IgM and CD19/20.  . 2.   Renew Adderall for attention deficit and fatigue 3.    Continue baclofen for spasticity up to 20 mg 3 times daily 4.    He needs doctors note for Oct 10 day after he was in the ED due to lethargy Return to see me in 6 months or sooner if there are new or worsening symptoms.  Asberry Lascola A. Felecia Shelling, MD, PhD [id number], [id number] PM Certified in Neurology, Clinical Neurophysiology, Sleep Medicine, Pain Medicine and Neuroimaging  [facility] [address] [place], [place] [address] [phone]

## 2022-03-20 LAB — CBC WITH DIFFERENTIAL/PLATELET
Basophils Absolute: 0.1 10*3/uL (ref 0.0–0.2)
Basos: 1 %
EOS (ABSOLUTE): 0.2 10*3/uL (ref 0.0–0.4)
Eos: 4 %
Hematocrit: 38.7 % (ref 37.5–51.0)
Hemoglobin: 13.1 g/dL (ref 13.0–17.7)
Immature Grans (Abs): 0 10*3/uL (ref 0.0–0.1)
Immature Granulocytes: 0 %
Lymphocytes Absolute: 2.1 10*3/uL (ref 0.7–3.1)
Lymphs: 37 %
MCH: 29.1 pg (ref 26.6–33.0)
MCHC: 33.9 g/dL (ref 31.5–35.7)
MCV: 86 fL (ref 79–97)
Monocytes Absolute: 0.7 10*3/uL (ref 0.1–0.9)
Monocytes: 12 %
Neutrophils Absolute: 2.6 10*3/uL (ref 1.4–7.0)
Neutrophils: 46 %
Platelets: 353 10*3/uL (ref 150–450)
RBC: 4.5 x10E6/uL (ref 4.14–5.80)
RDW: 11.7 % (ref 11.6–15.4)
WBC: 5.6 10*3/uL (ref 3.4–10.8)

## 2022-03-20 LAB — IGG, IGA, IGM
IgA/Immunoglobulin A, Serum: 68 mg/dL — ABNORMAL LOW (ref 90–386)
IgG (Immunoglobin G), Serum: 658 mg/dL (ref 603–1613)
IgM (Immunoglobulin M), Srm: 15 mg/dL — ABNORMAL LOW (ref 20–172)

## 2022-03-24 ENCOUNTER — Ambulatory Visit: Payer: Medicare Other | Admitting: Neurology

## 2022-03-26 ENCOUNTER — Encounter (HOSPITAL_COMMUNITY): Payer: Self-pay | Admitting: Emergency Medicine

## 2022-03-26 ENCOUNTER — Ambulatory Visit (HOSPITAL_COMMUNITY)
Admission: EM | Admit: 2022-03-26 | Discharge: 2022-03-26 | Disposition: A | Discharge status: Home / Self Care | Payer: Medicare Other | Attending: Nurse Practitioner | Admitting: Nurse Practitioner

## 2022-03-26 DIAGNOSIS — H6692 Otitis media, unspecified, left ear: Secondary | ICD-10-CM | POA: Diagnosis not present

## 2022-03-26 DIAGNOSIS — H5213 Myopia, bilateral: Secondary | ICD-10-CM | POA: Diagnosis not present

## 2022-03-26 DIAGNOSIS — H6122 Impacted cerumen, left ear: Secondary | ICD-10-CM | POA: Diagnosis not present

## 2022-03-26 MED ORDER — AMOXICILLIN 875 MG PO TABS: 875.0000 mg | ORAL_TABLET | Freq: Two times a day (BID) | ORAL | 0 refills | Status: DC

## 2022-03-26 MED ORDER — NEOMYCIN-POLYMYXIN-DEXAMETH 3.5-10000-0.1 OP SUSP: OPHTHALMIC | 0 refills | Status: DC

## 2022-03-26 NOTE — Discharge Instructions (Signed)
Take medications as prescribed  Do not stop taking the antibiotic even if you start to feel better. Do not put anything inside the ear except the ear drops  Try to avoid getting water in the ear when showering  You may take over-the-counter tylenol or ibuprofen as needed for pain   Go the the ED immediately if:  You have swelling, redness, or pain around your ear. You get a stiff neck. You cannot move part of your face (paralysis). You notice that the bone behind your ear hurts when you touch it. You get a very bad headache.

## 2022-03-26 NOTE — ED Triage Notes (Signed)
Pt c/o left ear pain for couple days.  

## 2022-03-26 NOTE — ED Provider Notes (Signed)
[facility]    CSN: [id number] Arrival date & time: 03/26/22  1222      History   Chief Complaint Chief Complaint  Patient presents with   Otalgia    HPI SANDON YOHO is a 36 y.o. male.   Subjective:   ANTAVIUS SPERBECK is a 36 y.o. male who presents for possible ear infection. Symptoms include left ear pain. Onset of symptoms was 2 days ago and has been gradually worsening since that time. He also reports a mild headache and decreased hearing out the ear. He denies any congestion, fever, lightheadedness, sinus pressure, sneezing, or sore throat. He is drinking plenty of fluids. He tried rinsing his ear out which made it worse.   The following portions of the patient's history were reviewed and updated as appropriate: allergies, current medications, past family history, past medical history, past social history, past surgical history, and problem list.         Past Medical History:  Diagnosis Date   Depression    situaltional   Dyspnea    Multiple sclerosis (HCC) 12/24/13   Multiple sclerosis (H[facility])    Vision abnormalities     Patient Active Problem List   Diagnosis Date Noted   Substance-induced anxiety disorder (HCC) 02/22/2022   Marijuana use 02/22/2022   MDD (major depressive disorder), recurrent episode, severe (HCC) 02/21/2022   Overdose of undetermined intent 02/21/2022   Urinary hesitancy 12/29/2021   Acute COVID-19 06/30/2021   COVID-19 virus infection 06/04/2020   High risk sexual behavior 12/22/2017   Immunosuppression ([facility]) 12/22/2017   Screening for human immunodeficiency virus 12/22/2017   High risk medication use 12/21/2016   Hepatitis B antibody positive 07/19/2016   Spasticity 06/17/2016   Spells 07/22/2015   Depression with anxiety 07/02/2015   Other fatigue 05/01/2015   Numbness 05/01/2015   Gait disturbance 05/01/2015   Disturbed cognition 05/01/2015   Erectile dysfunction 05/01/2015   Multiple sclerosis exacerbation (HCC)  03/11/2015   Tobacco abuse 03/06/2015   Nausea without vomiting 11/08/2014   Depression due to multiple sclerosis (HCC) 11/08/2014   Relapsing remitting multiple sclerosis (HCC) 10/01/2014   Swelling of both hands 05/14/2014   Multiple sclerosis (HCC) 12/24/2013    Past Surgical History:  Procedure Laterality Date   NO PAST SURGERIES         Home Medications    Prior to Admission medications   Medication Sig Start Date End Date Taking? Authorizing Provider  amoxicillin (AMOXIL) 875 MG tablet Take 1 tablet (875 mg total) by mouth 2 (two) times daily. 03/26/22  Yes Lurline Idol, FNP  neomycin-polymyxin b-dexamethasone (MAXITROL) 3.5-10000-0.1 SUSP Place 1 drop in left ear four times a day for 7 days 03/26/22  Yes Lurline Idol, FNP  amphetamine-dextroamphetamine (ADDERALL XR) 20 MG 24 hr capsule Take 1 capsule (20 mg total) by mouth daily. 03/19/22   Sater, Pearletha Furl, MD  baclofen (LIORESAL) 20 MG tablet Take 1 tablet (20 mg total) by mouth 3 (three) times daily. 03/19/22   Sater, Pearletha Furl, MD  gabapentin (NEURONTIN) 600 MG tablet 1/2 to 1 po tid 03/19/22   Sater, Pearletha Furl, MD  ocrelizumab (OCREVUS) 300 MG/10ML injection HOLD WHILE ACTIVE INFECTION. PLEASE CHECK WITH YOUR NEUROLOGIST ON WHEN TO RESTART. 07/02/21   Narda Bonds, MD    Family History Family History  Problem Relation Age of Onset   Healthy Mother    Healthy Father    Cancer Maternal Grandmother  unknown    Ataxia Sister    Multiple sclerosis Neg Hx     Social History Social History   Tobacco Use   Smoking status: Former    Types: Cigarettes    Quit date: 04/18/2013    Years since quitting: 8.9   Smokeless tobacco: Former    Quit date: 09/08/2014  Substance Use Topics   Alcohol use: Yes   Drug use: No    Types: Marijuana    Comment: former     Allergies   Patient has no known allergies.   Review of Systems Review of Systems  Constitutional:  Negative for fever.  HENT:  Positive  for ear pain. Negative for congestion, ear discharge, rhinorrhea and sore throat.   Respiratory:  Negative for cough.   Gastrointestinal:  Negative for nausea and vomiting.  Musculoskeletal:  Negative for myalgias.  Neurological:  Positive for headaches. Negative for dizziness and light-headedness.  All other systems reviewed and are negative.    Physical Exam Triage Vital Signs ED Triage Vitals [03/26/22 1354]  Enc Vitals Group     BP 125/79     Pulse Rate 87     Resp 17     Temp 98.3 F (36.8 C)     Temp src      SpO2 98 %     Weight      Height      Head Circumference      Peak Flow      Pain Score 8     Pain Loc      Pain Edu?      Excl. in GC?    No data found.  Updated Vital Signs BP 125/79 (BP Location: Left Arm)   Pulse 87   Temp 98.3 F (36.8 C)   Resp 17   SpO2 98%   Visual Acuity Right Eye Distance:   Left Eye Distance:   Bilateral Distance:    Right Eye Near:   Left Eye Near:    Bilateral Near:     Physical Exam Vitals reviewed.  Constitutional:      Appearance: Normal appearance.  HENT:     Head: Normocephalic.     Right Ear: Hearing and external ear normal. Tenderness present. No swelling. There is impacted cerumen.     Left Ear: Tympanic membrane, ear canal and external ear normal. There is no impacted cerumen.     Nose: Nose normal.     Mouth/Throat:     Mouth: Mucous membranes are moist.  Eyes:     Conjunctiva/sclera: Conjunctivae normal.  Cardiovascular:     Rate and Rhythm: Normal rate.  Pulmonary:     Effort: Pulmonary effort is normal.  Musculoskeletal:        General: Normal range of motion.     Cervical back: Normal range of motion and neck supple.  Lymphadenopathy:     Cervical: No cervical adenopathy.  Skin:    General: Skin is warm and dry.  Neurological:     General: No focal deficit present.     Mental Status: He is alert and oriented to person, place, and time.      UC Treatments / Results  Labs (all labs  ordered are listed, but only abnormal results are displayed) Labs Reviewed - No data to display  EKG   Radiology No results found.  Procedures Ear Cerumen Removal  Date/Time: 03/26/2022 3:10 PM  Performed by: Lurline Idol, FNP Authorized by: Lurline Idol, FNP   Consent:  Consent obtained:  Verbal   Consent given by:  Patient   Risks, benefits, and alternatives were discussed: yes     Risks discussed:  Bleeding, infection, pain, dizziness, incomplete removal and TM perforation   Alternatives discussed:  No treatment Universal protocol:    Patient identity confirmed:  Verbally with patient and arm band Procedure details:    Location:  L ear   Procedure type: irrigation     Procedure outcomes: cerumen removed   Post-procedure details:    Inspection:  Ear canal clear, TM intact and no bleeding   Hearing quality:  Improved   Procedure completion:  Tolerated well, no immediate complications Comments:     Post-procedure assessment shows clear ear canal with no bleeding or swelling. Erythematous TM noted.   (including critical care time)  Medications Ordered in UC Medications - No data to display  Initial Impression / Assessment and Plan / UC Course  I have reviewed the triage vital signs and the nursing notes.  Pertinent labs & imaging results that were available during my care of the patient were reviewed by me and considered in my medical decision making (see chart for details).    36 yo male presents with left cerumen impaction and left otitis media. No focal deficits noted on exam. No mastoid process tenderness. Hearing grossly intact. Patient underwent irrigation of left ear without difficulty. Course of amoxicillin and maxitrol drops prescribed. Supportive care measures and indications for follow-up discussed with patient.   Today's evaluation has revealed no signs of a dangerous process. Discussed diagnosis with patient and/or guardian. Patient and/or  guardian aware of their diagnosis, possible red flag symptoms to watch out for and need for close follow up. Patient and/or guardian understands verbal and written discharge instructions. Patient and/or guardian comfortable with plan and disposition.  Patient and/or guardian has a clear mental status at this time, good insight into illness (after discussion and teaching) and has clear judgment to make decisions regarding their care  Documentation was completed with the aid of voice recognition software. Transcription may contain typographical errors. Final Clinical Impressions(s) / UC Diagnoses   Final diagnoses:  Left otitis media, unspecified otitis media type     Discharge Instructions      Take medications as prescribed  Do not stop taking the antibiotic even if you start to feel better. Do not put anything inside the ear except the ear drops  Try to avoid getting water in the ear when showering  You may take over-the-counter tylenol or ibuprofen as needed for pain   Go the the ED immediately if:  You have swelling, redness, or pain around your ear. You get a stiff neck. You cannot move part of your face (paralysis). You notice that the bone behind your ear hurts when you touch it. You get a very bad headache.     ED Prescriptions     Medication Sig Dispense Auth. Provider   amoxicillin (AMOXIL) 875 MG tablet Take 1 tablet (875 mg total) by mouth 2 (two) times daily. 14 tablet Eiko Mcgowen, [place], FNP   neomycin-polymyxin b-dexamethasone (MAXITROL) 3.5-10000-0.1 SUSP Place 1 drop in left ear four times a day for 7 days 5 mL Lurline Idol, FNP      PDMP not reviewed this encounter.   Lurline Idol, [place] 03/26/22 1514

## 2022-04-14 ENCOUNTER — Observation Stay (HOSPITAL_COMMUNITY)
Admission: EM | Admit: 2022-04-14 | Discharge: 2022-04-16 | Disposition: A | Discharge status: Home / Self Care | Payer: Medicare Other | Attending: Internal Medicine | Admitting: Internal Medicine

## 2022-04-14 ENCOUNTER — Observation Stay (HOSPITAL_COMMUNITY): Payer: Medicare Other

## 2022-04-14 ENCOUNTER — Telehealth: Payer: Self-pay | Admitting: Neurology

## 2022-04-14 ENCOUNTER — Encounter (HOSPITAL_COMMUNITY): Payer: Self-pay

## 2022-04-14 DIAGNOSIS — R531 Weakness: Secondary | ICD-10-CM

## 2022-04-14 DIAGNOSIS — D72819 Decreased white blood cell count, unspecified: Secondary | ICD-10-CM | POA: Insufficient documentation

## 2022-04-14 DIAGNOSIS — J101 Influenza due to other identified influenza virus with other respiratory manifestations: Secondary | ICD-10-CM | POA: Diagnosis not present

## 2022-04-14 DIAGNOSIS — F332 Major depressive disorder, recurrent severe without psychotic features: Secondary | ICD-10-CM | POA: Diagnosis not present

## 2022-04-14 DIAGNOSIS — Z1152 Encounter for screening for COVID-19: Secondary | ICD-10-CM | POA: Diagnosis not present

## 2022-04-14 DIAGNOSIS — Z87891 Personal history of nicotine dependence: Secondary | ICD-10-CM | POA: Diagnosis not present

## 2022-04-14 DIAGNOSIS — G35 Multiple sclerosis: Secondary | ICD-10-CM

## 2022-04-14 DIAGNOSIS — Z79899 Other long term (current) drug therapy: Secondary | ICD-10-CM | POA: Diagnosis not present

## 2022-04-14 DIAGNOSIS — M50222 Other cervical disc displacement at C5-C6 level: Secondary | ICD-10-CM | POA: Diagnosis not present

## 2022-04-14 DIAGNOSIS — G9389 Other specified disorders of brain: Secondary | ICD-10-CM | POA: Diagnosis not present

## 2022-04-14 DIAGNOSIS — G8194 Hemiplegia, unspecified affecting left nondominant side: Secondary | ICD-10-CM | POA: Diagnosis not present

## 2022-04-14 DIAGNOSIS — T68XXXA Hypothermia, initial encounter: Secondary | ICD-10-CM | POA: Diagnosis not present

## 2022-04-14 DIAGNOSIS — G35D Multiple sclerosis, unspecified: Secondary | ICD-10-CM | POA: Diagnosis present

## 2022-04-14 DIAGNOSIS — G8192 Hemiplegia, unspecified affecting left dominant side: Secondary | ICD-10-CM | POA: Diagnosis not present

## 2022-04-14 DIAGNOSIS — Z8709 Personal history of other diseases of the respiratory system: Secondary | ICD-10-CM | POA: Diagnosis not present

## 2022-04-14 DIAGNOSIS — Z720 Tobacco use: Secondary | ICD-10-CM | POA: Diagnosis present

## 2022-04-14 DIAGNOSIS — I69354 Hemiplegia and hemiparesis following cerebral infarction affecting left non-dominant side: Secondary | ICD-10-CM | POA: Diagnosis not present

## 2022-04-14 DIAGNOSIS — R9431 Abnormal electrocardiogram [ECG] [EKG]: Secondary | ICD-10-CM | POA: Diagnosis not present

## 2022-04-14 DIAGNOSIS — R58 Hemorrhage, not elsewhere classified: Secondary | ICD-10-CM | POA: Diagnosis not present

## 2022-04-14 DIAGNOSIS — R0602 Shortness of breath: Secondary | ICD-10-CM | POA: Diagnosis not present

## 2022-04-14 DIAGNOSIS — M50223 Other cervical disc displacement at C6-C7 level: Secondary | ICD-10-CM | POA: Diagnosis not present

## 2022-04-14 DIAGNOSIS — R269 Unspecified abnormalities of gait and mobility: Secondary | ICD-10-CM | POA: Diagnosis not present

## 2022-04-14 DIAGNOSIS — G35A Relapsing-remitting multiple sclerosis: Secondary | ICD-10-CM | POA: Diagnosis present

## 2022-04-14 LAB — CBC
HCT: 40.6 % (ref 39.0–52.0)
Hemoglobin: 13.3 g/dL (ref 13.0–17.0)
MCH: 29.8 pg (ref 26.0–34.0)
MCHC: 32.8 g/dL (ref 30.0–36.0)
MCV: 90.8 fL (ref 80.0–100.0)
Platelets: 239 10*3/uL (ref 150–400)
RBC: 4.47 MIL/uL (ref 4.22–5.81)
RDW: 12.9 % (ref 11.5–15.5)
WBC: 3.8 10*3/uL — ABNORMAL LOW (ref 4.0–10.5)
nRBC: 0 % (ref 0.0–0.2)

## 2022-04-14 LAB — LACTIC ACID, PLASMA: Lactic Acid, Venous: 1.8 mmol/L (ref 0.5–1.9)

## 2022-04-14 LAB — BASIC METABOLIC PANEL
Anion gap: 8 (ref 5–15)
BUN: 9 mg/dL (ref 6–20)
CO2: 25 mmol/L (ref 22–32)
Calcium: 8.5 mg/dL — ABNORMAL LOW (ref 8.9–10.3)
Chloride: 105 mmol/L (ref 98–111)
Creatinine, Ser: 0.82 mg/dL (ref 0.61–1.24)
GFR, Estimated: >60 mL/min (ref >60)
Glucose, Bld: 88 mg/dL (ref 70–99)
Potassium: 4 mmol/L (ref 3.5–5.1)
Sodium: 138 mmol/L (ref 135–145)

## 2022-04-14 LAB — RESP PANEL BY RT-PCR (FLU A&B, COVID) ARPGX2
Influenza A by PCR: NEGATIVE
Influenza B by PCR: POSITIVE — AB
SARS Coronavirus 2 by RT PCR: NEGATIVE

## 2022-04-14 LAB — SARS CORONAVIRUS 2 BY RT PCR: SARS Coronavirus 2 by RT PCR: NEGATIVE

## 2022-04-14 LAB — CBG MONITORING, ED: Glucose-Capillary: 100 mg/dL — ABNORMAL HIGH (ref 70–99)

## 2022-04-14 MED ORDER — ONDANSETRON HCL 4 MG PO TABS: 4.0000 mg | ORAL_TABLET | Freq: Four times a day (QID) | ORAL | Status: DC | PRN

## 2022-04-14 MED ORDER — ACETAMINOPHEN 325 MG PO TABS: 650.0000 mg | ORAL_TABLET | Freq: Four times a day (QID) | ORAL | Status: DC | PRN

## 2022-04-14 MED ORDER — BENZONATATE 100 MG PO CAPS: 200.0000 mg | ORAL_CAPSULE | Freq: Three times a day (TID) | ORAL | Status: DC | PRN

## 2022-04-14 MED ORDER — GABAPENTIN 300 MG PO CAPS
300.0000 mg | ORAL_CAPSULE | Freq: Three times a day (TID) | ORAL | Status: DC
  Administered 2022-04-14 – 2022-04-16 (×5): 300 mg via ORAL
  Filled 2022-04-14 (×5): qty 1

## 2022-04-14 MED ORDER — LORATADINE 10 MG PO TABS
10.0000 mg | ORAL_TABLET | Freq: Every day | ORAL | Status: DC
  Administered 2022-04-14 – 2022-04-16 (×3): 10 mg via ORAL
  Filled 2022-04-14 (×3): qty 1

## 2022-04-14 MED ORDER — OXYCODONE HCL 5 MG PO TABS
5.0000 mg | ORAL_TABLET | ORAL | Status: DC | PRN
  Administered 2022-04-14 – 2022-04-15 (×3): 5 mg via ORAL
  Filled 2022-04-14 (×3): qty 1

## 2022-04-14 MED ORDER — MAGNESIUM CITRATE PO SOLN: 1.0000 | Freq: Once | ORAL | Status: DC | PRN

## 2022-04-14 MED ORDER — SORBITOL 70 % SOLN: 30.0000 mL | Freq: Every day | Status: DC | PRN

## 2022-04-14 MED ORDER — ONDANSETRON HCL 4 MG/2ML IJ SOLN: 4.0000 mg | Freq: Four times a day (QID) | INTRAMUSCULAR | Status: DC | PRN

## 2022-04-14 MED ORDER — IPRATROPIUM-ALBUTEROL 0.5-2.5 (3) MG/3ML IN SOLN
3.0000 mL | Freq: Three times a day (TID) | RESPIRATORY_TRACT | Status: DC
  Administered 2022-04-14 – 2022-04-15 (×3): 3 mL via RESPIRATORY_TRACT
  Filled 2022-04-14 (×3): qty 3

## 2022-04-14 MED ORDER — SODIUM CHLORIDE 0.9 % IV SOLN: INTRAVENOUS | Status: DC

## 2022-04-14 MED ORDER — METOPROLOL TARTRATE 5 MG/5ML IV SOLN: 5.0000 mg | Freq: Four times a day (QID) | INTRAVENOUS | Status: DC | PRN

## 2022-04-14 MED ORDER — ENOXAPARIN SODIUM 40 MG/0.4ML IJ SOSY
40.0000 mg | PREFILLED_SYRINGE | INTRAMUSCULAR | Status: DC
  Filled 2022-04-14 (×2): qty 0.4

## 2022-04-14 MED ORDER — POLYETHYLENE GLYCOL 3350 17 G PO PACK: 17.0000 g | PACK | Freq: Every day | ORAL | Status: DC | PRN

## 2022-04-14 MED ORDER — AMPHETAMINE-DEXTROAMPHET ER 10 MG PO CP24
20.0000 mg | ORAL_CAPSULE | Freq: Every day | ORAL | Status: DC
  Filled 2022-04-14: qty 2
  Filled 2022-04-14: qty 1

## 2022-04-14 MED ORDER — SODIUM CHLORIDE 0.9 % IV BOLUS
1000.0000 mL | Freq: Once | INTRAVENOUS | Status: AC
  Administered 2022-04-14: 1000 mL via INTRAVENOUS

## 2022-04-14 MED ORDER — MORPHINE SULFATE (PF) 2 MG/ML IV SOLN: 1.0000 mg | INTRAVENOUS | Status: DC | PRN

## 2022-04-14 MED ORDER — GADOBUTROL 1 MMOL/ML IV SOLN
6.0000 mL | Freq: Once | INTRAVENOUS | Status: AC | PRN
  Administered 2022-04-14: 6 mL via INTRAVENOUS

## 2022-04-14 MED ORDER — OSELTAMIVIR PHOSPHATE 75 MG PO CAPS
75.0000 mg | ORAL_CAPSULE | Freq: Two times a day (BID) | ORAL | Status: DC
  Administered 2022-04-14 – 2022-04-16 (×4): 75 mg via ORAL
  Filled 2022-04-14 (×4): qty 1

## 2022-04-14 MED ORDER — SODIUM CHLORIDE 0.9% FLUSH
3.0000 mL | Freq: Two times a day (BID) | INTRAVENOUS | Status: DC
  Administered 2022-04-14 – 2022-04-16 (×3): 3 mL via INTRAVENOUS

## 2022-04-14 MED ORDER — GUAIFENESIN ER 600 MG PO TB12
1200.0000 mg | ORAL_TABLET | Freq: Two times a day (BID) | ORAL | Status: DC
  Administered 2022-04-14 – 2022-04-16 (×4): 1200 mg via ORAL
  Filled 2022-04-14 (×4): qty 2

## 2022-04-14 MED ORDER — OSELTAMIVIR PHOSPHATE 75 MG PO CAPS
75.0000 mg | ORAL_CAPSULE | Freq: Once | ORAL | Status: AC
  Administered 2022-04-14: 75 mg via ORAL
  Filled 2022-04-14: qty 1

## 2022-04-14 MED ORDER — ACETAMINOPHEN 500 MG PO TABS: 1000.0000 mg | ORAL_TABLET | Freq: Four times a day (QID) | ORAL | Status: DC | PRN

## 2022-04-14 MED ORDER — PANTOPRAZOLE SODIUM 40 MG PO TBEC
40.0000 mg | DELAYED_RELEASE_TABLET | Freq: Every day | ORAL | Status: DC
  Administered 2022-04-15 – 2022-04-16 (×2): 40 mg via ORAL
  Filled 2022-04-14 (×2): qty 1

## 2022-04-14 MED ORDER — BACLOFEN 20 MG PO TABS
20.0000 mg | ORAL_TABLET | Freq: Three times a day (TID) | ORAL | Status: DC
  Administered 2022-04-14 – 2022-04-16 (×5): 20 mg via ORAL
  Filled 2022-04-14 (×2): qty 2
  Filled 2022-04-14 (×3): qty 1

## 2022-04-14 MED ORDER — ACETAMINOPHEN 500 MG PO TABS
1000.0000 mg | ORAL_TABLET | Freq: Once | ORAL | Status: AC
  Administered 2022-04-14: 1000 mg via ORAL

## 2022-04-14 MED ORDER — FLUTICASONE PROPIONATE 50 MCG/ACT NA SUSP
2.0000 | Freq: Every day | NASAL | Status: DC
  Administered 2022-04-15 – 2022-04-16 (×2): 2 via NASAL
  Filled 2022-04-14: qty 16

## 2022-04-14 NOTE — Telephone Encounter (Signed)
Pt mother is calling. Stated she wants to talk to Dr. Epimenio Foot because pt has been at emergency room since 9 pm last night. Stated pt can't walk because his MS is acting up. She is requesting a call-back from nurse.

## 2022-04-14 NOTE — Plan of Care (Signed)
Patient with known multiple sclerosis briefly discussed with me.  He is having recurrent left-sided weakness in the setting of influenza be similar to prior MS flares  Recommend MRI brain and C-spine with and without contrast.  If there is evidence of acute demyelination (contrast-enhancing lesions), would need to consider pulse dose steroids for MS flare.  However if the studies are negative, pseudo flare in the setting of influenza would be the diagnosis and he would need primary treatment of his infection.  Neurology will follow-up MRI brain and C-spine, but please do not hesitate to reach out if other questions or concerns arise as this is a curbside recommendation based on limited review of the chart and brief information provided by ED and admitting physician  Brooke Dare MD-PhD [facility] [phone] Available 7 AM to 7 PM, outside these hours please contact Neurologist on call listed on AMION

## 2022-04-14 NOTE — ED Provider Notes (Signed)
[facility]-EMERGENCY DEPT Provider Note   CSN: [id number] Arrival date & time: 04/14/22  0057     History  Chief Complaint  Patient presents with   Weakness    Ewing KYION GAUTIER is a 36 y.o. male.  The history is provided by the patient and medical records. No language interpreter was used.  Weakness    Patient is a 35 year old male with history of MS presenting to ED for CC weakness. Patient initially presented with cough, fever, other URI symptoms and tested positive for influenza virus. Patient states that he often has MS flare-ups when he is ill. Patient reports weakness/numbness, especially in the lower extremities, as of last night. Endorses nausea, vomiting. Denies paresthesias, mental status changes, vision changes, extremity pain. No aggravating/alleviating factors noted.   Home Medications Prior to Admission medications   Medication Sig Start Date End Date Taking? Authorizing Provider  amoxicillin (AMOXIL) 875 MG tablet Take 1 tablet (875 mg total) by mouth 2 (two) times daily. 03/26/22   Lurline Idol, FNP  amphetamine-dextroamphetamine (ADDERALL XR) 20 MG 24 hr capsule Take 1 capsule (20 mg total) by mouth daily. 03/19/22   Sater, Pearletha Furl, MD  baclofen (LIORESAL) 20 MG tablet Take 1 tablet (20 mg total) by mouth 3 (three) times daily. 03/19/22   Sater, Pearletha Furl, MD  gabapentin (NEURONTIN) 600 MG tablet 1/2 to 1 po tid 03/19/22   Sater, Pearletha Furl, MD  neomycin-polymyxin b-dexamethasone (MAXITROL) 3.5-10000-0.1 SUSP Place 1 drop in left ear four times a day for 7 days 03/26/22   Lurline Idol, FNP  ocrelizumab (OCREVUS) 300 MG/10ML injection HOLD WHILE ACTIVE INFECTION. PLEASE CHECK WITH YOUR NEUROLOGIST ON WHEN TO RESTART. 07/02/21   Narda Bonds, MD      Allergies    Patient has no known allergies.    Review of Systems   Review of Systems  Neurological:  Positive for weakness.  All other systems reviewed and are negative.   Physical  Exam Updated Vital Signs BP 114/61 (BP Location: Right Arm)   Pulse 88   Temp 99.5 F (37.5 C) (Oral)   Resp 18   SpO2 96%  Physical Exam Vitals and nursing note reviewed.  Constitutional:      General: He is not in acute distress.    Appearance: He is well-developed.  HENT:     Head: Atraumatic.  Eyes:     Conjunctiva/sclera: Conjunctivae normal.  Cardiovascular:     Rate and Rhythm: Normal rate and regular rhythm.     Pulses: Normal pulses.     Heart sounds: Normal heart sounds.  Pulmonary:     Effort: Pulmonary effort is normal.     Breath sounds: Normal breath sounds. No wheezing, rhonchi or rales.  Abdominal:     Palpations: Abdomen is soft.     Tenderness: There is no abdominal tenderness.  Musculoskeletal:     Cervical back: Neck supple.  Skin:    Findings: No rash.  Neurological:     Mental Status: He is alert and oriented to person, place, and time.     Comments: 5/5 RUE, 4/5 LUE, 4/5 RLE, 3/5 LLE. Diminished sensation LUE/LLE compared to RUE/RLE.  Hyperreflexic Patella DTR on the left compare to the right.      ED Results / Procedures / Treatments   Labs (all labs ordered are listed, but only abnormal results are displayed) Labs Reviewed  RESP PANEL BY RT-PCR (FLU A&B, COVID) ARPGX2 - Abnormal; Notable for the  following components:      Result Value   Influenza B by PCR POSITIVE (*)    All other components within normal limits  SARS CORONAVIRUS 2 BY RT PCR  BASIC METABOLIC PANEL  CBC  URINALYSIS, ROUTINE W REFLEX MICROSCOPIC  LACTIC ACID, PLASMA  LACTIC ACID, PLASMA  CBG MONITORING, ED    EKG None  Radiology No results found.  Procedures Procedures    Medications Ordered in ED Medications  sodium chloride 0.9 % bolus 1,000 mL (1,000 mLs Intravenous New Bag/Given 04/14/22 1509)  oseltamivir (TAMIFLU) capsule 75 mg (75 mg Oral Given 04/14/22 1503)    ED Course/ Medical Decision Making/ A&P Clinical Course as of 04/14/22 1518  Tue Apr 14, 2022  1510 L arm/leg weakness Hx of MS.  Consult out to neurology - DR. S Bhegat.   MRI vs emperic steroids.  [WF]    Clinical Course User Index [WF] Gailen Shelter, [place]                           Medical Decision Making Amount and/or Complexity of Data Reviewed Labs: ordered.  Risk Prescription drug management.   BP 114/61 (BP Location: Right Arm)   Pulse 88   Temp 99.5 F (37.5 C) (Oral)   Resp 18   SpO2 96%   32:58 PM 36 year old male significant history of MS on Ocrevus  presenting with cold symptoms.   Patient report for the past 2 days he has had congestion cough headache generalized body aches fever chills and states it felt like he is having COVID infection which he has had in the past.  Furthermore, he is complaining of progressive worsening numbness and weakness to the left side of his body felt similar to prior MS flare.  He tries over-the-counter medication without adequate relief.  He does not endorse any nausea vomiting diarrhea denies any vision changes.  He reported he is having trouble ambulating due to his numbness and weakness and states he has been hospitalized for this in the past.  On exam this is a normal appearing male laying in bed appears to be in no acute discomfort.  Heart with normal rate rate and rhythm, lungs clear on auscultation, abdomen soft nontender.  Subjective decrease sensation noted to left side of body compared to right.  4/5 strength to left upper extremity, 5 out of 5 strength to right upper extremity, 3 out of 5 strength to left lower extremity, 4/5 right lower extremity.  Patient is mildly hyperreflexive to left patella compared to right.  No obvious foot drops.  Intact pedal pulses.  -Labs ordered, independently viewed and interpreted by me.  Labs remarkable for positive Flu B. -The patient was maintained on a cardiac monitor.  I personally viewed and interpreted the cardiac monitored which showed an underlying rhythm of: NSR -This patient  presents to the ED for concern of cold sxs, this involves an extensive number of treatment options, and is a complaint that carries with it a high risk of complications and morbidity.  The differential diagnosis includes covid, flu, rsv, pneumonia, viral illness -Co morbidities that complicate the patient evaluation includes MS -Treatment includes tamiflu -Reevaluation of the patient after these medicines showed that the patient stayed the same -PCP office notes or outside notes reviewed -Discussion with neurology Dr. Iver Nestle who is currently occupied by will return call and give recommendation in regard to further management of MS sxs -Escalation to admission/observation considered:  patient will benefit from hospital admission due to recrudescence of MS sxs due to underlying flu infection  2:59 PM Patient here with worsening left-sided weakness and numbness which is likely a recrudescence of his MS in the setting of influenza B infection.  Since he is unable to ambulate, he would benefit from hospital admission for further management.  He would likely benefit from steroid treatment.  Will consult on-call neurologist for recommendation.   3:18 PM Patient signed out to oncoming provider who will consult medicine for admission.        Final Clinical Impression(s) / ED Diagnoses Final diagnoses:  Influenza B  Left hemiparesis [facility])    Rx / DC Orders ED Discharge Orders     None         Fayrene Helper, PA-C 04/14/22 1519    Alvira Monday, MD 04/17/22 [phone]

## 2022-04-14 NOTE — H&P (Signed)
History and Physical    Mike Lowery UXL:[id number] DOB: 11/30/1985 DOA: 04/14/2022  PCP: Asa Lente, MD  Patient coming from: Home  I have personally briefly reviewed patient's old medical records [facility]  Chief Complaint: Left-sided weakness/upper respiratory symptoms  HPI: Mike Lowery is a 36 y.o. male with medical history significant of MS who presents to the ED with a 1 day history of increasing left-sided weakness and numbness in the setting of upper respiratory symptoms of cough, fever, congestion, myalgias.  Patient does endorse sore throat 1 day prior to onset of symptoms.  Patient presented to the ED due to concerns for MS flare.  Patient denies any hematemesis, hematochezia, melena.  No chest pain.  No significant shortness of breath.  Some abdominal discomfort.  No diarrhea, no constipation.  Patient does endorse some generalized weakness as well.  ED Course: Patient seen in the ED SARS coronavirus 19 negative.  Influenza A negative.  Influenza B positive.  Basic metabolic profile with a calcium of 8.5 otherwise unremarkable.  Lactic acid level of 1.8.  CBC with a white count of 3.8 otherwise within normal limits.  Urinalysis not done.  Chest x-ray is not done.  EKG pending.  ED PA stated spoke with neurology who recommended MRI brain MRI C-spine to see for an acute demyelinating process is noted and recommended admission for supportive care.  Review of Systems: As per HPI otherwise all other systems reviewed and are negative.  Past Medical History:  Diagnosis Date   Depression    situaltional   Dyspnea    Multiple sclerosis (HCC) 12/24/13   Multiple sclerosis (HCC)    Vision abnormalities     Past Surgical History:  Procedure Laterality Date   NO PAST SURGERIES      Social History  reports that he quit smoking about 8 years ago. His smoking use included cigarettes. He quit smokeless tobacco use about 7 years ago. He reports current alcohol use. He  reports that he does not use drugs.  No Known Allergies  Family History  Problem Relation Age of Onset   Healthy Mother    Healthy Father    Cancer Maternal Grandmother        unknown    Ataxia Sister    Multiple sclerosis Neg Hx    Mother alive age 67 and healthy.  Father alive age 70 and healthy.  Prior to Admission medications   Medication Sig Start Date End Date Taking? Authorizing Provider  amoxicillin (AMOXIL) 875 MG tablet Take 1 tablet (875 mg total) by mouth 2 (two) times daily. 03/26/22   Lurline Idol, FNP  amphetamine-dextroamphetamine (ADDERALL XR) 20 MG 24 hr capsule Take 1 capsule (20 mg total) by mouth daily. 03/19/22   Sater, Pearletha Furl, MD  baclofen (LIORESAL) 20 MG tablet Take 1 tablet (20 mg total) by mouth 3 (three) times daily. 03/19/22   Sater, Pearletha Furl, MD  gabapentin (NEURONTIN) 600 MG tablet 1/2 to 1 po tid 03/19/22   Sater, Pearletha Furl, MD  neomycin-polymyxin b-dexamethasone (MAXITROL) 3.5-10000-0.1 SUSP Place 1 drop in left ear four times a day for 7 days 03/26/22   Lurline Idol, FNP  ocrelizumab (OCREVUS) 300 MG/10ML injection HOLD WHILE ACTIVE INFECTION. PLEASE CHECK WITH YOUR NEUROLOGIST ON WHEN TO RESTART. 07/02/21   Narda Bonds, MD    Physical Exam: Vitals:   04/14/22 0731 04/14/22 0846 04/14/22 1349 04/14/22 1445  BP: 114/61  108/62   Pulse: 88  75  91  Resp: 18  16   Temp: (!) 101.6 F (38.7 C) 99.5 F (37.5 C) 98.4 F (36.9 C)   TempSrc: Oral Oral Oral   SpO2: 96%  99% 100%    Constitutional: NAD, calm, comfortable Vitals:   04/14/22 0731 04/14/22 0846 04/14/22 1349 04/14/22 1445  BP: 114/61  108/62   Pulse: 88  75 91  Resp: 18  16   Temp: (!) 101.6 F (38.7 C) 99.5 F (37.5 C) 98.4 F (36.9 C)   TempSrc: Oral Oral Oral   SpO2: 96%  99% 100%   Eyes: PERRL, lids and conjunctivae normal ENMT: Mucous membranes are dry. Posterior pharynx clear of any exudate or lesions.Normal dentition.  Neck: normal, supple, no masses, no  thyromegaly Respiratory: clear to auscultation bilaterally, no wheezing, no crackles. Normal respiratory effort. No accessory muscle use.  Cardiovascular: Regular rate and rhythm, no murmurs / rubs / gallops. No extremity edema. 2+ pedal pulses. No carotid bruits.  Abdomen: no tenderness, no masses palpated. No hepatosplenomegaly. Bowel sounds positive.  Musculoskeletal: no clubbing / cyanosis. No joint deformity upper and lower extremities. Good ROM, no contractures. Normal muscle tone.  Skin: no rashes, lesions, ulcers. No induration Neurologic: CN 2-12 grossly intact.  Decree sensation to the left.  5/5 right upper extremity strength.  4/5 left upper extremity and left lower extremity strength.  4/5 right lower extremity strength.  Psychiatric: Normal judgment and insight. Alert and oriented x 3. Normal mood.   Labs on Admission: I have personally reviewed following labs and imaging studies  CBC: Recent Labs  Lab 04/14/22 1500  WBC 3.8*  HGB 13.3  HCT 40.6  MCV 90.8  PLT 239    Basic Metabolic Panel: Recent Labs  Lab 04/14/22 1500  NA 138  K 4.0  CL 105  CO2 25  GLUCOSE 88  BUN 9  CREATININE 0.82  CALCIUM 8.5*    GFR: CrCl cannot be calculated (Unknown ideal weight.).  Liver Function Tests: No results for input(s): "AST", "ALT", "ALKPHOS", "BILITOT", "PROT", "ALBUMIN" in the last 168 hours.  Urine analysis:    Component Value Date/Time   COLORURINE AMBER (A) 03/23/2015 0136   APPEARANCEUR CLEAR 03/23/2015 0136   LABSPEC 1.034 (H) 03/23/2015 0136   PHURINE 6.0 03/23/2015 0136   GLUCOSEU NEGATIVE 03/23/2015 0136   HGBUR NEGATIVE 03/23/2015 0136   BILIRUBINUR SMALL (A) 03/23/2015 0136   KETONESUR 15 (A) 03/23/2015 0136   PROTEINUR NEGATIVE 03/23/2015 0136   UROBILINOGEN 1.0 03/23/2015 0136   NITRITE NEGATIVE 03/23/2015 0136   LEUKOCYTESUR NEGATIVE 03/23/2015 0136    Radiological Exams on Admission: No results found.  EKG: Independently reviewed.   Pending  Assessment/Plan Principal Problem:   Influenza B Active Problems:   Acute left-sided weakness   Multiple sclerosis (HCC)   Relapsing remitting multiple sclerosis (HCC)   Tobacco abuse   Gait disturbance   MDD (major depressive disorder), recurrent episode, severe (HCC)   #1 influenza B -Patient presented with a 1 day history of upper respiratory symptoms including cough, fevers, congestion, myalgias.  Symptoms preceded by sore throat 1 day prior to onset of symptoms. -COVID-19 PCR negative, influenza A negative.  Influenza B positive. -Check a chest x-ray. -Place on IV fluids, Tamiflu x 5 days, IV antiemetics, pain management, supportive care. -Place on Claritin, Flonase, scheduled DuoNebs.  2.  History of relapsing remitting multiple sclerosis -Patient with complaints of acute worsening left-sided weakness and numbness, in the setting of bowel respiratory viral infection. -Patient noted  to have upper respiratory symptoms with workup positive for influenza B. -Concern for MS flare. -ED PA discussed with neurology, Dr. Iver Nestle who recommended MRI brain MRI C-spine for further evaluation and if positive for demyelinating lesions then patient will need a formal neurology consultation. -Resume home regimen Neurontin, baclofen. -IV fluids, supportive care. -PT/OT.  3.  Tobacco abuse -Tobacco cessation.  4.  MDD -Stable.   DVT prophylaxis: Lovenox Code Status:   Full Family Communication:  Updated patient and mother at bedside. Disposition Plan:   Patient is from:  Home  Anticipated DC to:  TBD  Anticipated DC date:  TBD  Anticipated DC barriers: Clinical improvement Consults called:   Admission status:  Placed in observation  Severity of Illness: The appropriate patient status for this patient is OBSERVATION. Observation status is judged to be reasonable and necessary in order to provide the required intensity of service to ensure the patient's safety. The  patient's presenting symptoms, physical exam findings, and initial radiographic and laboratory data in the context of their medical condition is felt to place them at decreased risk for further clinical deterioration. Furthermore, it is anticipated that the patient will be medically stable for discharge from the hospital within 2 midnights of admission.     Ramiro Harvest MD [facility]  How to contact the [facility] Attending or Consulting provider 7A - 7P or covering provider during after hours 7P -7A, for this patient?   Check the care team in [facility] and look for a) attending/consulting [facility] provider listed and b) the [facility] team listed Log into [email or web address] and use [place]'s universal password to access. If you do not have the password, please contact the hospital operator. Locate the [facility] provider you are looking for under [facility] and page to a number that you can be directly reached. If you still have difficulty reaching the provider, please page the [facility] (Director on Call) for the Hospitalists listed on amion for assistance.  04/14/2022, 6:16 PM

## 2022-04-14 NOTE — Plan of Care (Signed)
MRI brain and cervical spine unchanged from prior studies with chronic demyelinating lesions noted in the brain and cervical spinal cord, but no evidence for active demyelination.   Agree with Dr. Rollene Fare plan of care note. Given that the MRI scans are negative for acute lesions, the patient most likely is experiencing a pseudo flare in the setting of influenza.  He will need primary treatment of his infection.   Please do not hesitate to call the Neurohospitalist service if there are any additional questions.   Electronically signed: Dr. Caryl Pina

## 2022-04-14 NOTE — ED Provider Notes (Signed)
Accepted handoff at shift change from [facility]. Please see prior provider note for more detail.   Briefly: Patient is 36 y.o.   Patient is a 36 year old gentleman with past medical history significant for relapsing remitting MS presented today with 2 days of left upper and lower extremity weakness.  Seems he has some chronic left leg weakness but is able to walk without a cane.  Does have occasional falls over.  The past 2 days he has had significant worsening weakness in his left leg primarily with hip flexion and also significant weakness of his left upper extremity states he is no longer able to walk.  Had similar symptoms in February with COVID.  Today was diagnosed with influenza B.  He is not significantly symptomatic from his influenza.  Coughs occasionally    Plan: Discussed with neurology     Physical Exam  BP 108/62 (BP Location: Right Arm)   Pulse 75   Temp 98.4 F (36.9 C) (Oral)   Resp 16   SpO2 99%   Physical Exam  Procedures  Procedures  ED Course / MDM   Clinical Course as of 04/14/22 1713  Tue Apr 14, 2022  1510 L arm/leg weakness Hx of MS.  Consult out to neurology - DR. S Bhegat.   MRI vs emperic steroids.  [WF]  1523 PT OT [WF]    Clinical Course User Index [WF] Gailen Shelter, PA   Medical Decision Making Amount and/or Complexity of Data Reviewed Labs: ordered. Radiology: ordered.  Risk OTC drugs. Prescription drug management.   Discussed with neurology who recommends MRI C-spine and brain will hold off on steroids empirically.  Given patient's profound weakness we will go ahead and admit to medicine for PT/OT and symptomatic treatment.  Discussed with Dr. Janee Morn who will admit and touch base with neurology when MRI results are available.      Solon [place], [place] 04/14/22 1737    Loetta Rough, MD 04/14/22 1910

## 2022-04-14 NOTE — ED Triage Notes (Signed)
Patient arrived with complaints of generalized weakness, headache, sore throat, and numb all over that started about 5 hours ago, history of MS. States he thinks he has covid-19 and requesting a swab.

## 2022-04-15 ENCOUNTER — Other Ambulatory Visit: Payer: Self-pay

## 2022-04-15 ENCOUNTER — Encounter (HOSPITAL_COMMUNITY): Payer: Self-pay | Admitting: Internal Medicine

## 2022-04-15 DIAGNOSIS — J101 Influenza due to other identified influenza virus with other respiratory manifestations: Secondary | ICD-10-CM | POA: Diagnosis not present

## 2022-04-15 DIAGNOSIS — F332 Major depressive disorder, recurrent severe without psychotic features: Secondary | ICD-10-CM | POA: Diagnosis not present

## 2022-04-15 DIAGNOSIS — R269 Unspecified abnormalities of gait and mobility: Secondary | ICD-10-CM

## 2022-04-15 DIAGNOSIS — R531 Weakness: Secondary | ICD-10-CM | POA: Diagnosis not present

## 2022-04-15 DIAGNOSIS — G35 Multiple sclerosis: Secondary | ICD-10-CM | POA: Diagnosis not present

## 2022-04-15 DIAGNOSIS — Z720 Tobacco use: Secondary | ICD-10-CM | POA: Diagnosis not present

## 2022-04-15 LAB — COMPREHENSIVE METABOLIC PANEL
ALT: 36 U/L (ref 0–44)
AST: 28 U/L (ref 15–41)
Albumin: 3.2 g/dL — ABNORMAL LOW (ref 3.5–5.0)
Alkaline Phosphatase: 51 U/L (ref 38–126)
Anion gap: 5 (ref 5–15)
BUN: 7 mg/dL (ref 6–20)
CO2: 27 mmol/L (ref 22–32)
Calcium: 8.1 mg/dL — ABNORMAL LOW (ref 8.9–10.3)
Chloride: 108 mmol/L (ref 98–111)
Creatinine, Ser: 0.95 mg/dL (ref 0.61–1.24)
GFR, Estimated: >60 mL/min (ref >60)
Glucose, Bld: 109 mg/dL — ABNORMAL HIGH (ref 70–99)
Potassium: 3.9 mmol/L (ref 3.5–5.1)
Sodium: 140 mmol/L (ref 135–145)
Total Bilirubin: 0.4 mg/dL (ref 0.3–1.2)
Total Protein: 5.5 g/dL — ABNORMAL LOW (ref 6.5–8.1)

## 2022-04-15 LAB — URINALYSIS, ROUTINE W REFLEX MICROSCOPIC
Bacteria, UA: NONE SEEN
Bilirubin Urine: NEGATIVE
Glucose, UA: NEGATIVE mg/dL
Hgb urine dipstick: NEGATIVE
Ketones, ur: NEGATIVE mg/dL
Leukocytes,Ua: NEGATIVE
Nitrite: NEGATIVE
Protein, ur: NEGATIVE mg/dL
Specific Gravity, Urine: 1.008 (ref 1.005–1.030)
pH: 6 (ref 5.0–8.0)

## 2022-04-15 LAB — CBC
HCT: 38.6 % — ABNORMAL LOW (ref 39.0–52.0)
Hemoglobin: 12.5 g/dL — ABNORMAL LOW (ref 13.0–17.0)
MCH: 29.4 pg (ref 26.0–34.0)
MCHC: 32.4 g/dL (ref 30.0–36.0)
MCV: 90.8 fL (ref 80.0–100.0)
Platelets: 219 K/uL (ref 150–400)
RBC: 4.25 MIL/uL (ref 4.22–5.81)
RDW: 13.1 % (ref 11.5–15.5)
WBC: 2.6 K/uL — ABNORMAL LOW (ref 4.0–10.5)
nRBC: 0 % (ref 0.0–0.2)

## 2022-04-15 LAB — MAGNESIUM: Magnesium: 1.8 mg/dL (ref 1.7–2.4)

## 2022-04-15 LAB — PHOSPHORUS: Phosphorus: 4.2 mg/dL (ref 2.5–4.6)

## 2022-04-15 LAB — CBG MONITORING, ED: Glucose-Capillary: 115 mg/dL — ABNORMAL HIGH (ref 70–99)

## 2022-04-15 MED ORDER — SODIUM CHLORIDE 0.9 % IV SOLN: INTRAVENOUS | Status: DC

## 2022-04-15 MED ORDER — IPRATROPIUM-ALBUTEROL 0.5-2.5 (3) MG/3ML IN SOLN: 3.0000 mL | RESPIRATORY_TRACT | Status: DC | PRN

## 2022-04-15 NOTE — Evaluation (Signed)
Occupational Therapy Evaluation Patient Details Name: Mike Lowery MRN: [id number] DOB: Apr 03, 1986 Today's Date: 04/15/2022   History of Present Illness Patient is a 36 year old male who presented with weakness, sore throat, and numbness. Patient tested positive for influenza virus. Neurology reviewed imaging with no new evidence for active demyelination. PMH: MS   Clinical Impression   Patient is a 36 year old male who was admitted for above. Patient lives at home with father and was independent in ADLs. Patient was noted to have increased pain and decreased functional activity tolerance impacting participation in session. Patient noted to have HR up to 130s with standing attempts. Patient was noted to have decreased functional activity tolerance, decreased endurance, decreased standing balance, decreased safety awareness, and decreased knowledge of AD/AE impacting participation in ADLs. Patient would continue to benefit from skilled OT services at this time while admitted and after d/c to address noted deficits in order to improve overall safety and independence in ADLs.        Recommendations for follow up therapy are one component of a multi-disciplinary discharge planning process, led by the attending physician.  Recommendations may be updated based on patient status, additional functional criteria and insurance authorization.   Follow Up Recommendations  Home health OT     Assistance Recommended at Discharge Frequent or constant Supervision/Assistance  Patient can return home with the following Two people to help with walking and/or transfers;A lot of help with bathing/dressing/bathroom;Assist for transportation;Help with stairs or ramp for entrance    Functional Status Assessment  Patient has had a recent decline in their functional status and demonstrates the ability to make significant improvements in function in a reasonable and predictable amount of time.  Equipment  Recommendations  None recommended by OT    Recommendations for Other Services       Precautions / Restrictions Precautions Precautions: Fall Precaution Comments: watch HR Restrictions Weight Bearing Restrictions: No      Mobility Bed Mobility Overal bed mobility: Needs Assistance Bed Mobility: Supine to Sit, Sit to Supine     Supine to sit: HOB elevated, Mod assist, +2 for safety/equipment Sit to supine: Mod assist, +2 for safety/equipment   General bed mobility comments: noted uincreasedextensor  tone in LLE, slow to flex trunk to sit uprught on Left bed edge, assist LLE back onto bed.       Balance Overall balance assessment: Needs assistance Sitting-balance support: Bilateral upper extremity supported, Feet supported Sitting balance-Leahy Scale: Poor     Standing balance support: Bilateral upper extremity supported, During functional activity Standing balance-Leahy Scale: Poor Standing balance comment: requires support               ADL either performed or assessed with clinical judgement   ADL Overall ADL's : Needs assistance/impaired     Grooming: Minimal assistance;Bed level   Upper Body Bathing: Bed level;Minimal assistance   Lower Body Bathing: Bed level;Maximal assistance   Upper Body Dressing : Bed level;Moderate assistance   Lower Body Dressing: Bed level;Maximal assistance   Toilet Transfer: +2 for physical assistance;+2 for safety/equipment;Moderate assistance Toilet Transfer Details (indicate cue type and reason): patient was noted to self limit WB on LLE with increased pain and spams reported by patient. patients HR noted to increase to 140 bpm with standing attempt. unable to take steps at this time returned to supine. Toileting- Clothing Manipulation and Hygiene: Maximal assistance;Sit to/from stand  Vision Patient Visual Report: No change from baseline              Pertinent Vitals/Pain Pain Assessment Pain  Assessment: Faces Faces Pain Scale: Hurts even more Pain Location: left leg spasms Pain Descriptors / Indicators: Contraction, Tightness, Cramping Pain Intervention(s): Limited activity within patient's tolerance, Patient requesting pain meds-RN notified     Hand Dominance Right   Extremity/Trunk Assessment Upper Extremity Assessment Upper Extremity Assessment: LUE deficits/detail LUE Deficits / Details: noted to have decreased strength compared to R side. 3/5 strength. patient ROM BUE WFL.   Lower Extremity Assessment Lower Extremity Assessment: Defer to PT evaluation (weakness in LLE with muscle spasms) RLE Sensation: WNL RLE Coordination: WNL LLE Deficits / Details: noted increased extensor spasms, decreased active hip and knee flex due to increased tone   Cervical / Trunk Assessment Cervical / Trunk Assessment: Other exceptions Cervical / Trunk Exceptions: noted increased trunk tone, decreased ability to sit forward initally   Communication Communication Communication: No difficulties   Cognition Arousal/Alertness: Awake/alert Behavior During Therapy: WFL for tasks assessed/performed Overall Cognitive Status: Within Functional Limits for tasks assessed                          Home Living Family/patient expects to be discharged to:: Private residence Living Arrangements: Parent Available Help at Discharge: Family;Available PRN/intermittently Type of Home: House Home Access: Stairs to enter [employer] of Steps: 7 Entrance Stairs-Rails: Right;Left Home Layout: One level     Bathroom Shower/Tub: Producer, television/film/video: Standard     Home Equipment: None   Additional Comments: lives with father      Prior Functioning/Environment Prior Level of Function : Independent/Modified Independent             Mobility Comments: does not use AD          OT Problem List: Decreased activity tolerance;Impaired balance (sitting and/or  standing);Decreased safety awareness;Decreased knowledge of precautions;Pain;Decreased knowledge of use of DME or AE;Cardiopulmonary status limiting activity      OT Treatment/Interventions: Self-care/ADL training;Energy conservation;Therapeutic exercise;DME and/or AE instruction;Neuromuscular education;Therapeutic activities;Patient/family education;Balance training    OT Goals(Current goals can be found in the care plan section) Acute Rehab OT Goals Patient Stated Goal: to get pain medicine OT Goal Formulation: With patient Time For Goal Achievement: 04/29/22 Potential to Achieve Goals: Fair  OT Frequency: Min 2X/week    Co-evaluation PT/OT/SLP Co-Evaluation/Treatment: Yes Reason for Co-Treatment: To address functional/ADL transfers;For patient/therapist safety PT goals addressed during session: Mobility/safety with mobility OT goals addressed during session: ADL's and self-care      AM-PAC OT "6 Clicks" Daily Activity     Outcome Measure Help from another person eating meals?: A Little Help from another person taking care of personal grooming?: A Little Help from another person toileting, which includes using toliet, bedpan, or urinal?: A Lot Help from another person bathing (including washing, rinsing, drying)?: A Lot Help from another person to put on and taking off regular upper body clothing?: A Lot Help from another person to put on and taking off regular lower body clothing?: A Lot 6 Click Score: 14   End of Session Nurse Communication: Mobility status;Other (comment) (HR during session and need for pain meds)  Activity Tolerance: Patient tolerated treatment well Patient left: in bed;with call bell/phone within reach (in ED)  OT Visit Diagnosis: Unsteadiness on feet (R26.81);Other abnormalities of gait and mobility (R26.89);Muscle weakness (generalized) (M62.81);Pain Pain - Right/Left: Left  Time: 1105-1120 OT Time Calculation (min): 15 min Charges:  OT  General Charges $OT Visit: 1 Visit OT Evaluation $OT Eval Moderate Complexity: 1 Mod  Coreon Simkins OTR/L, MS Acute Rehabilitation Department Office# [phone]   Selinda Flavin 04/15/2022, 1:20 PM

## 2022-04-15 NOTE — Plan of Care (Signed)
  Problem: Education: Goal: Knowledge of General Education information will improve Description Including pain rating scale, medication(s)/side effects and non-pharmacologic comfort measures Outcome: Progressing   Problem: Health Behavior/Discharge Planning: Goal: Ability to manage health-related needs will improve Outcome: Progressing   

## 2022-04-15 NOTE — Progress Notes (Signed)
PROGRESS NOTE    Mike Lowery  DJM:[id number] DOB: 1986/05/10 DOA: 04/14/2022 PCP: Asa Lente, MD   Brief Narrative:  Patient is a 36 year old African-American male with past medical history significant for but not limited to MS who presents to the ED with 1 day history of increasing left-sided weakness and numbness in the setting of upper respiratory symptoms of cough, fever congestion and myalgias.  He also endorses sore throat 1 day prior to onset of symptoms.  He presented to the ED due to concern for MS flare and denied any hematemesis, hematochezia or melena.  He denies any chest pain or shortness of breath he did have some abdominal discomfort.  Denies any diarrhea and continues to endorse some generalized weakness.  In the ED he was worked up and he had SARS-CoV-2 testing negative but influenza B was positive.  Labs were within normal limits and lactic acid level was 1.8.  CBC was showing a white count of 3.8 and urinalysis was not done as well as chest x-ray.  EKG was pending in the ED PA spoke with neurology who recommended a brain MRI as well as an MRI of the C-spine to see for an acute demyelinating process and upon further review the MRI scans were negative for any acute lesions and showed that his chronic demyelinating lesions were unchanged and neurology team felt that the patient is likely experiencing a pseudo flare of his MS in the setting of influenza.  Currently will continue supportive care and obtain PT and OT to further evaluate and currently they are recommending home health.  Assessment and Plan:  Influenza B -Patient presented with a 1 day history of upper respiratory symptoms including cough, fevers, congestion, myalgias.  Symptoms preceded by sore throat 1 day prior to onset of symptoms. -COVID-19 PCR negative, influenza A negative.  Influenza B positive. -Checked a chest x-ray and showed "No active cardiopulmonary disease." . -Place on IV fluids with NS at 125  mL/hr but will reduce to 75 mL/hr x 1 day, Tamiflu x 5 days, IV antiemetics with po/IV Ondansetron, pain management with Oxycodone 5 mg po q4hprn Moderate Pain, supportive care. -Place on Claritin, Flonase, scheduled DuoNebs 3 mL neb 3 times daily. -Continue with antitussives with benzonatate 200 g p.o. 3 times daily as needed cough and guaifenesin 1200 mg p.o. twice daily -Continue to Monitor Respiratory Status carefully   History of Relapsing Remitting Multiple Sclerosis -Patient with complaints of acute worsening left-sided weakness and numbness, in the setting of bowel respiratory viral infection. -Patient noted to have upper respiratory symptoms with workup positive for influenza B. -Concern for MS flare. -ED PA discussed with neurology, Dr. Iver Nestle who recommended MRI brain MRI C-spine for further evaluation and if positive for demyelinating lesions then patient will need a formal neurology consultation. MRI doe and showed "Unchanged distribution of white matter lesions consistent with multiple sclerosis. No new or active demyelinating lesions."  -Resume home regimen Neurontin, baclofen. -IV fluids, supportive care. -PT/OT recommending Home Health .   Tobacco Abuse -Smoking cessation counseling given   MDD ADHD -Stable. -Continue with Adderall XR 20 mg p.o. daily and gabapentin 300 mg p.o. 3 times daily  GERD/GI prophylaxis -Continue with pantoprazole 40 mg p.o. daily   DVT prophylaxis: enoxaparin (LOVENOX) injection 40 mg Start: 04/14/22 2200    Code Status: Full Code Family Communication: No family currently at bedside  Disposition Plan:  Level of care: Telemetry Status is: Observation The patient will require care spanning >  2 midnights and should be moved to inpatient because: Continues to not feel well and states that he continues to feel weak in his legs.  Will need at least 1 more day of treatment and IV fluid hydration prior to reevaluation tomorrow   Consultants:   Neurology  Procedures:  As delineated as above  Antimicrobials:  Anti-infectives (From admission, onward)    Start     Dose/Rate Route Frequency Ordered Stop   04/14/22 2200  oseltamivir (TAMIFLU) capsule 75 mg        75 mg Oral 2 times daily 04/14/22 1740 04/19/22 2159   04/14/22 1345  oseltamivir (TAMIFLU) capsule 75 mg        75 mg Oral  Once 04/14/22 1342 04/14/22 1503       Subjective: Seen and examined at bedside he states that he is not feeling well and continued to feel weak.  No nausea or vomiting.  Denies any chest pain.  No other concerns or complaints at this time.  Objective: Vitals:   04/15/22 1050 04/15/22 1300 04/15/22 1408 04/15/22 1450  BP: 132/68 122/71  119/74  Pulse: 100 93  86  Resp: 17 17  18   Temp: 99.3 F (37.4 C)  99.8 F (37.7 C) 99.5 F (37.5 C)  TempSrc:   Oral Oral  SpO2: 97% 98%  100%  Weight:      Height:        Intake/Output Summary (Last 24 hours) at 04/15/2022 1539 Last data filed at 04/15/2022 1120 Gross per 24 hour  Intake 932.4 ml  Output 1000 ml  Net -67.6 ml   Filed Weights   04/15/22 1045  Weight: 63.5 kg   Examination: Physical Exam:  Constitutional: Thin African-American male in no acute distress Respiratory: Diminished to auscultation bilaterally with coarse breath sounds, no wheezing, rales, rhonchi or crackles. Normal respiratory effort and patient is not tachypenic. No accessory muscle use.  Unlabored breathing Cardiovascular: RRR, no murmurs / rubs / gallops. S1 and S2 auscultated.  Abdomen: Soft, non-tender, non-distended. Bowel sounds positive.  GU: Deferred. Musculoskeletal: No clubbing / cyanosis of digits/nails. No joint deformity upper and lower extremities. .  Skin: No rashes, lesions, ulcers on limited skin evaluation. No induration; Warm and dry.  Neurologic: CN 2-12 grossly intact with no focal deficits. Romberg sign and cerebellar reflexes not assessed.  Psychiatric: Normal judgment and insight.  Alert and oriented x 3. Normal mood and appropriate affect.   Data Reviewed: I have personally reviewed following labs and imaging studies  CBC: Recent Labs  Lab 04/14/22 1500 04/15/22 0500  WBC 3.8* 2.6*  HGB 13.3 12.5*  HCT 40.6 38.6*  MCV 90.8 90.8  PLT 239 219   Basic Metabolic Panel: Recent Labs  Lab 04/14/22 1500 04/15/22 0500  NA 138 140  K 4.0 3.9  CL 105 108  CO2 25 27  GLUCOSE 88 109*  BUN 9 7  CREATININE 0.82 0.95  CALCIUM 8.5* 8.1*  MG  --  1.8  PHOS  --  4.2   GFR: Estimated Creatinine Clearance: 98.4 mL/min (by C-G formula based on SCr of 0.95 mg/dL). Liver Function Tests: Recent Labs  Lab 04/15/22 0500  AST 28  ALT 36  ALKPHOS 51  BILITOT 0.4  PROT 5.5*  ALBUMIN 3.2*   No results for input(s): "LIPASE", "AMYLASE" in the last 168 hours. No results for input(s): "AMMONIA" in the last 168 hours. Coagulation Profile: No results for input(s): "INR", "PROTIME" in the last 168 hours. Cardiac  Enzymes: No results for input(s): "CKTOTAL", "CKMB", "CKMBINDEX", "TROPONINI" in the last 168 hours. BNP (last 3 results) No results for input(s): "PROBNP" in the last 8760 hours. HbA1C: No results for input(s): "HGBA1C" in the last 72 hours. CBG: Recent Labs  Lab 04/14/22 2148 04/15/22 0749  GLUCAP 100* 115*   Lipid Profile: No results for input(s): "CHOL", "HDL", "LDLCALC", "TRIG", "CHOLHDL", "LDLDIRECT" in the last 72 hours. Thyroid Function Tests: No results for input(s): "TSH", "T4TOTAL", "FREET4", "T3FREE", "THYROIDAB" in the last 72 hours. Anemia Panel: No results for input(s): "VITAMINB12", "FOLATE", "FERRITIN", "TIBC", "IRON", "RETICCTPCT" in the last 72 hours. Sepsis Labs: Recent Labs  Lab 04/14/22 1500  LATICACIDVEN 1.8    Recent Results (from the past 240 hour(s))  SARS Coronavirus 2 by RT PCR (hospital order, performed in [facility] hospital lab) *cepheid single result test* Anterior Nasal Swab     Status: None   Collection Time:  04/14/22  1:31 AM   Specimen: Anterior Nasal Swab  Result Value Ref Range Status   SARS Coronavirus 2 by RT PCR NEGATIVE NEGATIVE Final    Comment: (NOTE) SARS-CoV-2 target nucleic acids are NOT DETECTED.  The SARS-CoV-2 RNA is generally detectable in upper and lower respiratory specimens during the acute phase of infection. The lowest concentration of SARS-CoV-2 viral copies this assay can detect is 250 copies / mL. A negative result does not preclude SARS-CoV-2 infection and should not be used as the sole basis for treatment or other patient management decisions.  A negative result may occur with improper specimen collection / handling, submission of specimen other than nasopharyngeal swab, presence of viral mutation(s) within the areas targeted by this assay, and inadequate number of viral copies (<250 copies / mL). A negative result must be combined with clinical observations, patient history, and epidemiological information.  Fact Sheet for Patients:   RoadLapTop.co.za  Fact Sheet for Healthcare Providers: http://kim-miller.com/  This test is not yet approved or  cleared by the [place] FDA and has been authorized for detection and/or diagnosis of SARS-CoV-2 by FDA under an Emergency Use Authorization (EUA).  This EUA will remain in effect (meaning this test can be used) for the duration of the COVID-19 declaration under Section 564(b)(1) of the Act, 21 U.S.C. section 360bbb-3(b)(1), unless the authorization is terminated or revoked sooner.  Performed at [facility], [address], [place], [place] [address]   Resp Panel by RT-PCR (Flu A&B, Covid) Anterior Nasal Swab     Status: Abnormal   Collection Time: 04/14/22  7:55 AM   Specimen: Anterior Nasal Swab  Result Value Ref Range Status   SARS Coronavirus 2 by RT PCR NEGATIVE NEGATIVE Final    Comment: (NOTE) SARS-CoV-2 target nucleic acids are NOT  DETECTED.  The SARS-CoV-2 RNA is generally detectable in upper respiratory specimens during the acute phase of infection. The lowest concentration of SARS-CoV-2 viral copies this assay can detect is 138 copies/mL. A negative result does not preclude SARS-Cov-2 infection and should not be used as the sole basis for treatment or other patient management decisions. A negative result may occur with  improper specimen collection/handling, submission of specimen other than nasopharyngeal swab, presence of viral mutation(s) within the areas targeted by this assay, and inadequate number of viral copies(<138 copies/mL). A negative result must be combined with clinical observations, patient history, and epidemiological information. The expected result is Negative.  Fact Sheet for Patients:  BloggerCourse.com  Fact Sheet for Healthcare Providers:  SeriousBroker.it  This test  is no t yet approved or cleared by the [place] and  has been authorized for detection and/or diagnosis of SARS-CoV-2 by FDA under an Emergency Use Authorization (EUA). This EUA will remain  in effect (meaning this test can be used) for the duration of the COVID-19 declaration under Section 564(b)(1) of the Act, 21 U.S.C.section 360bbb-3(b)(1), unless the authorization is terminated  or revoked sooner.       Influenza A by PCR NEGATIVE NEGATIVE Final   Influenza B by PCR POSITIVE (A) NEGATIVE Final    Comment: (NOTE) The Xpert Xpress SARS-CoV-2/FLU/RSV plus assay is intended as an aid in the diagnosis of influenza from Nasopharyngeal swab specimens and should not be used as a sole basis for treatment. Nasal washings and aspirates are unacceptable for Xpert Xpress SARS-CoV-2/FLU/RSV testing.  Fact Sheet for Patients: BloggerCourse.com  Fact Sheet for Healthcare Providers: SeriousBroker.it  This test is not  yet approved or cleared by the [place] FDA and has been authorized for detection and/or diagnosis of SARS-CoV-2 by FDA under an Emergency Use Authorization (EUA). This EUA will remain in effect (meaning this test can be used) for the duration of the COVID-19 declaration under Section 564(b)(1) of the Act, 21 U.S.C. section 360bbb-3(b)(1), unless the authorization is terminated or revoked.  Performed at [facility], [address], [place], [place] [address]      Radiology Studies: MR Brain W and Wo Contrast  Result Date: 04/14/2022 CLINICAL DATA:  Multiple sclerosis EXAM: MRI HEAD WITHOUT AND WITH CONTRAST TECHNIQUE: Multiplanar, multiecho pulse sequences of the brain and surrounding structures were obtained without and with intravenous contrast. CONTRAST:  6mL GADAVIST GADOBUTROL 1 MMOL/ML IV SOLN COMPARISON:  01/09/2022 FINDINGS: Brain: No acute infarct, mass effect or extra-axial collection. No acute or chronic hemorrhage. There are multiple hyperintense T2-weighted signal lesions within the periventricular and juxtacortical white matter. The number, size and distribution of lesions is unchanged from the most recent MRI. There are no contrast-enhancing lesions to indicate active demyelination. The midline structures are normal. Vascular: Major flow voids are preserved. Skull and upper cervical spine: Normal calvarium and skull base. Visualized upper cervical spine and soft tissues are normal. Sinuses/Orbits:No paranasal sinus fluid levels or advanced mucosal thickening. No mastoid or middle ear effusion. Normal orbits. IMPRESSION: Unchanged distribution of white matter lesions consistent with multiple sclerosis. No new or active demyelinating lesions. If Electronically Signed   By: Deatra Robinson M.D.   On: 04/14/2022 21:14   MR Cervical Spine W or Wo Contrast  Result Date: 04/14/2022 CLINICAL DATA:  Although sclerosis. Symptoms similar to prior flares. Flu B  positive. Concern for new/recrudescence. EXAM: MRI CERVICAL SPINE WITHOUT AND WITH CONTRAST TECHNIQUE: Multiplanar and multiecho pulse sequences of the cervical spine, to include the craniocervical junction and cervicothoracic junction, were obtained without and with intravenous contrast. CONTRAST:  6mL GADAVIST GADOBUTROL 1 MMOL/ML IV SOLN COMPARISON:  MRI cervical spine 06/04/2020 FINDINGS: Alignment: There is again straightening of the normal cervical lordosis. No sagittal spondylolisthesis. Vertebrae: Vertebral body heights are maintained. Disc spaces are preserved. Mildly decreased disc hydration at C2-3 and even less decreased disc hydration at C3-4 through C6-7, similar to prior. Normal marrow signal. Cord: There is again diffuse patchy increased STIR signal seen throughout the visualized cervical cord. This includes increased STIR signal that involves the majority of the left-greater-than-right C2-3 through the inferior C3 levels, the mid and right dorsal aspect of the cord at the C3-4 level, diffuse cord at the C4-5  level, dorsal right cord at the C5-6 level, diffuse cord at the C6 and C7 vertebral body levels, and mid and left aspect of the cord at the C7 and T1 level. No definite change from 06/04/2020. No definitive cord expansion is seen. Again postcontrast imaging is degraded by motion artifact, and again no definitive enhancing cord lesion is seen. Posterior Fossa, vertebral arteries, paraspinal tissues: Negative. Disc levels: C3-4: Mild bilateral facet joint hypertrophy, right-greater-than-left. Mild right uncovertebral hypertrophy. Minimal right neuroforaminal narrowing without true stenosis, unchanged. No central canal stenosis. C5-6: Mild bilateral facet joint hypertrophy. Mild broad-based posterior disc bulge. Minimal right neuroforaminal narrowing without true stenosis. No central canal stenosis. No significant change. C6-7: Mild bilateral facet joint hypertrophy. Midline posterior annular disc  tear and mild midline posterior disc protrusion. No central canal or neuroforaminal stenosis. IMPRESSION: 1. There is again extensive patchy increased STIR signal seen throughout the visualized cervical and upper thoracic spinal cord. No definite change. No definitive cord expansion or enhancing cord lesion is seen. 2. Within the limitations of motion artifact, no cord enhancement is seen to suggest active demyelination. Electronically Signed   By: Neita Garnet M.D.   On: 04/14/2022 21:13   X-ray chest PA and lateral  Result Date: 04/14/2022 CLINICAL DATA:  Shortness of breath. EXAM: CHEST - 2 VIEW COMPARISON:  June 30, 2021. FINDINGS: The heart size and mediastinal contours are within normal limits. Both lungs are clear. The visualized skeletal structures are unremarkable. IMPRESSION: No active cardiopulmonary disease. Electronically Signed   By: Lupita Raider M.D.   On: 04/14/2022 18:46    Scheduled Meds:  amphetamine-dextroamphetamine  20 mg Oral Daily   baclofen  20 mg Oral TID   enoxaparin (LOVENOX) injection  40 mg Subcutaneous Q24H   fluticasone  2 spray Each Nare Daily   gabapentin  300 mg Oral TID   guaiFENesin  1,200 mg Oral BID   ipratropium-albuterol  3 mL Nebulization TID   loratadine  10 mg Oral Daily   oseltamivir  75 mg Oral BID   pantoprazole  40 mg Oral Q0600   sodium chloride flush  3 mL Intravenous Q12H   Continuous Infusions:  sodium chloride 125 mL/hr at 04/14/22 2220    LOS: 0 days   Marguerita Merles, DO [facility] Available via Epic secure chat 7am-7pm After these hours, please refer to coverage provider listed on [email or web address] 04/15/2022, 3:39 PM

## 2022-04-15 NOTE — Evaluation (Signed)
Physical Therapy Evaluation Patient Details Name: Mike Lowery MRN: [id number] DOB: [birth year] Today's Date: 04/15/2022  History of Present Illness  Mike Lowery is a 36 y.o. male with medical history significant of MS who presents to the ED 04/14/22 with a 1 day history of increasing left-sided weakness and numbness in the setting of upper respiratory symptoms of cough, fever, congestion, myalgias, Positive for Flu. Neurology consulted, per MRI,no noted changes in demyelination.  Clinical Impression  Pt admitted with above diagnosis.  Pt currently with functional limitations due to the deficits listed below (see PT Problem List). Pt will benefit from skilled PT to increase their independence and safety with mobility to allow discharge to the venue listed below.     The patient is requesting pain medication, RN notified, cooler room temperature, supplied a fan  in the room.  Patient  with noted increased tone on the left LE and trunk , decreased control of active movement. Patient reports this is worsened than baseline where he is ambulatory without an AD. Continue PT for progressive mobility, hoping  for return to PLOF.     Recommendations for follow up therapy are one component of a multi-disciplinary discharge planning process, led by the attending physician.  Recommendations may be updated based on patient status, additional functional criteria and insurance authorization.  Follow Up Recommendations Home health PT      Assistance Recommended at Discharge Frequent or constant Supervision/Assistance  Patient can return home with the following  A lot of help with walking and/or transfers;A little help with bathing/dressing/bathroom;Assistance with cooking/housework;Assist for transportation;Help with stairs or ramp for entrance    Equipment Recommendations  (tbd)  Recommendations for Other Services       Functional Status Assessment Patient has had a recent decline in their  functional status and demonstrates the ability to make significant improvements in function in a reasonable and predictable amount of time.     Precautions / Restrictions Precautions Precautions: Fall      Mobility  Bed Mobility Overal bed mobility: Needs Assistance Bed Mobility: Supine to Sit, Sit to Supine     Supine to sit: HOB elevated, Mod assist, +2 for safety/equipment Sit to supine: Mod assist, +2 for safety/equipment   General bed mobility comments: noted uincreasedextensor  tone in LLE, slow to flex trunk to sit uprught on Left bed edge, assist LLE back onto bed.    Transfers Overall transfer level: Needs assistance Equipment used: 1 person hand held assist Transfers: Sit to/from Stand Sit to Stand: Mod assist, +2 safety/equipment           General transfer comment: stnads, weight shifted to right, unabvle to setp sideways.    Ambulation/Gait                  Stairs            Wheelchair Mobility    Modified Rankin (Stroke Patients Only)       Balance Overall balance assessment: Needs assistance Sitting-balance support: Bilateral upper extremity supported, Feet supported Sitting balance-Leahy Scale: Poor     Standing balance support: Bilateral upper extremity supported, During functional activity Standing balance-Leahy Scale: Poor Standing balance comment: requires support                             Pertinent Vitals/Pain Pain Assessment Pain Assessment: Faces Faces Pain Scale: Hurts even more Pain Location: left leg spasms Pain Descriptors / Indicators:  Contraction, Tightness, Cramping Pain Intervention(s): Limited activity within patient's tolerance, Patient requesting pain meds-RN notified    Home Living Family/patient expects to be discharged to:: Private residence   Available Help at Discharge: Family;Available PRN/intermittently Type of Home: House Home Access: Stairs to enter Entrance Stairs-Rails:  Doctor, general practice of Steps: 7   Home Layout: One level Home Equipment: None Additional Comments: lives with father    Prior Function Prior Level of Function : Independent/Modified Independent             Mobility Comments: does not use AD       Hand Dominance   Dominant Hand: Right    Extremity/Trunk Assessment        Lower Extremity Assessment Lower Extremity Assessment: RLE deficits/detail;LLE deficits/detail RLE Sensation: WNL RLE Coordination: WNL LLE Deficits / Details: noted increased extensor spasms, decreased active hip and knee flex due to increased tone    Cervical / Trunk Assessment Cervical / Trunk Assessment: Other exceptions Cervical / Trunk Exceptions: noted increased trunk tone, decreased ability to sit forward initally  Communication   Communication: No difficulties  Cognition Arousal/Alertness: Awake/alert Behavior During Therapy: WFL for tasks assessed/performed Overall Cognitive Status: Within Functional Limits for tasks assessed                                          General Comments      Exercises     Assessment/Plan    PT Assessment Patient needs continued PT services  PT Problem List Decreased strength;Decreased mobility;Cardiopulmonary status limiting activity;Impaired tone;Decreased range of motion;Decreased balance;Decreased coordination;Pain       PT Treatment Interventions DME instruction;Therapeutic activities;Stair training;Balance training;Gait training;Functional mobility training;Therapeutic exercise;Neuromuscular re-education;Patient/family education    PT Goals (Current goals can be found in the Care Plan section)  Acute Rehab PT Goals Patient Stated Goal: to  not be in pain PT Goal Formulation: With patient Time For Goal Achievement: 04/29/22 Potential to Achieve Goals: Good    Frequency Min 3X/week     Co-evaluation               AM-PAC PT "6 Clicks" Mobility   Outcome Measure Help needed turning from your back to your side while in a flat bed without using bedrails?: A Lot Help needed moving from lying on your back to sitting on the side of a flat bed without using bedrails?: A Lot Help needed moving to and from a bed to a chair (including a wheelchair)?: A Lot Help needed standing up from a chair using your arms (e.g., wheelchair or bedside chair)?: A Lot Help needed to walk in hospital room?: Total Help needed climbing 3-5 steps with a railing? : Total 6 Click Score: 10    End of Session   Activity Tolerance: Patient limited by fatigue;Patient limited by pain;Treatment limited secondary to medical complications (Comment) Patient left: in bed;with call bell/phone within reach Nurse Communication: Mobility status PT Visit Diagnosis: Unsteadiness on feet (R26.81);Difficulty in walking, not elsewhere classified (R26.2);Other symptoms and signs involving the nervous system (R29.898);Pain Pain - Right/Left: Left Pain - part of body: Leg    Time: 1100-1125 PT Time Calculation (min) (ACUTE ONLY): 25 min   Charges:   PT Evaluation $PT Eval Low Complexity: 1 Low          Blanchard Kelch PT Acute Rehabilitation Services Office [phone] Weekend [phone]   Rada Hay  04/15/2022, 12:14 PM

## 2022-04-16 ENCOUNTER — Observation Stay (HOSPITAL_COMMUNITY): Payer: Medicare Other

## 2022-04-16 DIAGNOSIS — G35 Multiple sclerosis: Secondary | ICD-10-CM | POA: Diagnosis not present

## 2022-04-16 DIAGNOSIS — Z720 Tobacco use: Secondary | ICD-10-CM | POA: Diagnosis not present

## 2022-04-16 DIAGNOSIS — R269 Unspecified abnormalities of gait and mobility: Secondary | ICD-10-CM | POA: Diagnosis not present

## 2022-04-16 DIAGNOSIS — D72819 Decreased white blood cell count, unspecified: Secondary | ICD-10-CM | POA: Diagnosis not present

## 2022-04-16 DIAGNOSIS — J101 Influenza due to other identified influenza virus with other respiratory manifestations: Secondary | ICD-10-CM | POA: Diagnosis not present

## 2022-04-16 DIAGNOSIS — R531 Weakness: Secondary | ICD-10-CM | POA: Diagnosis not present

## 2022-04-16 DIAGNOSIS — R0602 Shortness of breath: Secondary | ICD-10-CM | POA: Diagnosis not present

## 2022-04-16 DIAGNOSIS — G8194 Hemiplegia, unspecified affecting left nondominant side: Secondary | ICD-10-CM | POA: Diagnosis not present

## 2022-04-16 LAB — COMPREHENSIVE METABOLIC PANEL
ALT: 39 U/L (ref 0–44)
AST: 27 U/L (ref 15–41)
Albumin: 3.7 g/dL (ref 3.5–5.0)
Alkaline Phosphatase: 53 U/L (ref 38–126)
Anion gap: 8 (ref 5–15)
BUN: 5 mg/dL — ABNORMAL LOW (ref 6–20)
CO2: 26 mmol/L (ref 22–32)
Calcium: 8.5 mg/dL — ABNORMAL LOW (ref 8.9–10.3)
Chloride: 107 mmol/L (ref 98–111)
Creatinine, Ser: 0.77 mg/dL (ref 0.61–1.24)
GFR, Estimated: >60 mL/min (ref >60)
Glucose, Bld: 95 mg/dL (ref 70–99)
Potassium: 3.9 mmol/L (ref 3.5–5.1)
Sodium: 141 mmol/L (ref 135–145)
Total Bilirubin: 0.4 mg/dL (ref 0.3–1.2)
Total Protein: 6.1 g/dL — ABNORMAL LOW (ref 6.5–8.1)

## 2022-04-16 LAB — CBC WITH DIFFERENTIAL/PLATELET
Abs Immature Granulocytes: 0.01 10*3/uL (ref 0.00–0.07)
Basophils Absolute: 0 10*3/uL (ref 0.0–0.1)
Basophils Relative: 1 %
Eosinophils Absolute: 0 10*3/uL (ref 0.0–0.5)
Eosinophils Relative: 1 %
HCT: 41.3 % (ref 39.0–52.0)
Hemoglobin: 13.4 g/dL (ref 13.0–17.0)
Immature Granulocytes: 0 %
Lymphocytes Relative: 46 %
Lymphs Abs: 1.1 10*3/uL (ref 0.7–4.0)
MCH: 29.8 pg (ref 26.0–34.0)
MCHC: 32.4 g/dL (ref 30.0–36.0)
MCV: 92 fL (ref 80.0–100.0)
Monocytes Absolute: 0.6 10*3/uL (ref 0.1–1.0)
Monocytes Relative: 27 %
Neutro Abs: 0.6 10*3/uL — ABNORMAL LOW (ref 1.7–7.7)
Neutrophils Relative %: 25 %
Platelets: 232 10*3/uL (ref 150–400)
RBC: 4.49 MIL/uL (ref 4.22–5.81)
RDW: 12.9 % (ref 11.5–15.5)
WBC: 2.4 10*3/uL — ABNORMAL LOW (ref 4.0–10.5)
nRBC: 0 % (ref 0.0–0.2)

## 2022-04-16 LAB — PHOSPHORUS: Phosphorus: 5.2 mg/dL — ABNORMAL HIGH (ref 2.5–4.6)

## 2022-04-16 LAB — URINE CULTURE: Culture: NO GROWTH

## 2022-04-16 LAB — MAGNESIUM: Magnesium: 2 mg/dL (ref 1.7–2.4)

## 2022-04-16 MED ORDER — ONDANSETRON HCL 4 MG PO TABS: 4.0000 mg | ORAL_TABLET | Freq: Four times a day (QID) | ORAL | 0 refills | Status: DC | PRN

## 2022-04-16 MED ORDER — POLYETHYLENE GLYCOL 3350 17 G PO PACK: 17.0000 g | PACK | Freq: Every day | ORAL | 0 refills | Status: DC | PRN

## 2022-04-16 MED ORDER — LORATADINE 10 MG PO TABS: 10.0000 mg | ORAL_TABLET | Freq: Every day | ORAL | 0 refills | Status: AC | PRN

## 2022-04-16 MED ORDER — OSELTAMIVIR PHOSPHATE 75 MG PO CAPS: 75.0000 mg | ORAL_CAPSULE | Freq: Two times a day (BID) | ORAL | 0 refills | Status: AC

## 2022-04-16 MED ORDER — BENZONATATE 200 MG PO CAPS: 200.0000 mg | ORAL_CAPSULE | Freq: Three times a day (TID) | ORAL | 0 refills | Status: DC | PRN

## 2022-04-16 MED ORDER — GUAIFENESIN ER 600 MG PO TB12: 600.0000 mg | ORAL_TABLET | Freq: Two times a day (BID) | ORAL | 0 refills | Status: AC

## 2022-04-16 MED ORDER — ACETAMINOPHEN 325 MG PO TABS: 650.0000 mg | ORAL_TABLET | Freq: Four times a day (QID) | ORAL | 0 refills | Status: AC | PRN

## 2022-04-16 NOTE — Plan of Care (Signed)
  Problem: Education: Goal: Knowledge of General Education information will improve Description Including pain rating scale, medication(s)/side effects and non-pharmacologic comfort measures Outcome: Progressing   Problem: Health Behavior/Discharge Planning: Goal: Ability to manage health-related needs will improve Outcome: Progressing   

## 2022-04-16 NOTE — TOC Transition Note (Signed)
Transition of Care [facility]) - CM/SW Discharge Note   Patient Details  Name: Mike Lowery MRN: [id number] Date of Birth: April 14, 1986  Transition of Care [facility]) CM/SW Contact:  Golda Acre, RN Phone Number: 04/16/2022, 1:03 PM   Clinical Narrative:    Discharged to home with rolling walker   Final next level of care: Home/Self Care Barriers to Discharge: Barriers Resolved   Patient Goals and CMS Choice Patient states their goals for this hospitalization and ongoing recovery are:: to get well and go home CMS Medicare.gov Compare Post Acute Care list provided to:: Patient Choice offered to / list presented to : Patient  Discharge Placement                       Discharge Plan and Services   Discharge Planning Services: CM Consult            DME Arranged: Dan Humphreys rolling DME Agency: [employer] Date DME Agency Contacted: 04/16/22 Time DME Agency Contacted: 1100 Representative spoke with at DME Agency: [email or web address]            Social Determinants of Health (SDOH) Interventions     Readmission Risk Interventions   No data to display

## 2022-04-16 NOTE — TOC Progression Note (Signed)
Transition of Care [facility]) - Progression Note    Patient Details  Name: Mike Lowery MRN: [id number] Date of Birth: [birth year]  Transition of [facility]) CM/SW Contact  Golda Acre, RN Phone Number: 04/16/2022, 10:09 AM  Clinical Narrative:    Rolling walker ordered through [employer]   Expected Discharge Plan: Home/Self Care Barriers to Discharge: Continued Medical Work up  Expected Discharge Plan and Services Expected Discharge Plan: Home/Self Care   Discharge Planning Services: CM Consult   Living arrangements for the past 2 months: Single Family Home                                       Social Determinants of Health (SDOH) Interventions    Readmission Risk Interventions   No data to display

## 2022-04-16 NOTE — Discharge Summary (Signed)
Physician Discharge Summary   Patient: Mike Lowery MRN: [id number] DOB: 1985-06-23  Admit date:     04/14/2022  Discharge date: 04/16/22  Discharge Physician: Marguerita Merles, DO   PCP: Asa Lente, MD   Recommendations at discharge:   Follow-up with PCP within 1 to 2 weeks and repeat CBC, CMP, mag, Phos within 1 week Follow-up with neurology in outpatient setting within 1 to 2 weeks Continue home health physical therapy have DME rolling walker Repeat chest x-ray in the outpatient setting if necessary  Discharge Diagnoses: Principal Problem:   Influenza B Active Problems:   Acute left-sided weakness   Multiple sclerosis (HCC)   Relapsing remitting multiple sclerosis (HCC)   Tobacco abuse   Gait disturbance   MDD (major depressive disorder), recurrent episode, severe (HCC)   Left hemiparesis (HCC)   Leukopenia   Hyperphosphatemia  Resolved Problems:   * No resolved hospital problems. *  Hospital Course: Patient is a 36 year old African-American male with past medical history significant for but not limited to MS who presents to the ED with 1 day history of increasing left-sided weakness and numbness in the setting of upper respiratory symptoms of cough, fever congestion and myalgias. He also endorses sore throat 1 day prior to onset of symptoms. He presented to the ED due to concern for MS flare and denied any hematemesis, hematochezia or melena. He denies any chest pain or shortness of breath he did have some abdominal discomfort. Denies any diarrhea and continues to endorse some generalized weakness. In the ED he was worked up and he had SARS-CoV-2 testing negative but influenza B was positive. Labs were within normal limits and lactic acid level was 1.8. CBC was showing a white count of 3.8 and urinalysis was not done as well as chest x-ray. EKG was pending in the ED PA spoke with neurology who recommended a brain MRI as well as an MRI of the C-spine to see for an acute  demyelinating process and upon further review the MRI scans were negative for any acute lesions and showed that his chronic demyelinating lesions were unchanged and neurology team felt that the patient is likely experiencing a pseudo flare of his MS in the setting of influenza. Currently will continue supportive care and obtain PT and OT to further evaluate and currently they are recommending home health.   Assessment and Plan:  Influenza B, improving -Patient presented with a 1 day history of upper respiratory symptoms including cough, fevers, congestion, myalgias.  Symptoms preceded by sore throat 1 day prior to onset of symptoms. -COVID-19 PCR negative, influenza A negative.  Influenza B positive. -Checked a chest x-ray and showed "No active cardiopulmonary disease." . -Place on IV fluids with NS at 125 mL/hr but will reduce to 75 mL/hr x 1 day, Tamiflu x 5 days, IV antiemetics with po/IV Ondansetron, pain management with Oxycodone 5 mg po q4hprn Moderate Pain, supportive care. -Place on Claritin, Flonase, scheduled DuoNebs 3 mL neb 3 times daily. -Continue with antitussives with benzonatate 200 g p.o. 3 times daily as needed cough and guaifenesin 1200 mg p.o. twice daily -Continue to Monitor Respiratory Status carefully and he has no issues and is medically stable for discharge   History of Relapsing Remitting Multiple Sclerosis -Patient with complaints of acute worsening left-sided weakness and numbness, in the setting of bowel respiratory viral infection. -Patient noted to have upper respiratory symptoms with workup positive for influenza B. -Concern for MS flare. -ED PA discussed with neurology, Dr.  Bhagat who recommended MRI brain MRI C-spine for further evaluation and if positive for demyelinating lesions then patient will need a formal neurology consultation. MRI doe and showed "Unchanged distribution of white matter lesions consistent with multiple sclerosis. No new or active  demyelinating lesions."  -Resume home regimen Neurontin, baclofen. -IV fluids, supportive care. -PT/OT recommending Home Health and this was arranged prior to discharge -Rolling walker has been sent with the patient -Follow-up with primary neurologist in outpatient setting  Hyperphosphatemia -Mild and likely reactive -patient's Phos level is now 5.2 -Continue to Monitor and Trend in the outpatient setting  Leukopenia -Mild. WBC went from 3.8 -> 2.6 -> 2.4 -Continue to Monitor and Trend -Repeat CBC within 1 week  Tobacco Abuse -Smoking cessation counseling given   MDD ADHD -Stable. -Continue with Adderall XR 20 mg p.o. daily and gabapentin 300 mg p.o. 3 times daily   GERD/GI prophylaxis -Continue with pantoprazole 40 mg p.o. daily  Consultants: Neuro Procedures performed: As above Disposition: Home health Diet recommendation:  Discharge Diet Orders (From admission, onward)     Start     Ordered   04/16/22 0000  Diet general        04/16/22 1142           Regular diet DISCHARGE MEDICATION: Allergies as of 04/16/2022   No Known Allergies      Medication List     STOP taking these medications    amoxicillin 875 MG tablet Commonly known as: AMOXIL   neomycin-polymyxin b-dexamethasone 3.5-10000-0.1 Susp Commonly known as: MAXITROL       TAKE these medications    acetaminophen 325 MG tablet Commonly known as: TYLENOL Take 2 tablets (650 mg total) by mouth every 6 (six) hours as needed for fever.   amphetamine-dextroamphetamine 20 MG 24 hr capsule Commonly known as: Adderall XR Take 1 capsule (20 mg total) by mouth daily.   baclofen 20 MG tablet Commonly known as: LIORESAL Take 1 tablet (20 mg total) by mouth 3 (three) times daily.   benzonatate 200 MG capsule Commonly known as: TESSALON Take 1 capsule (200 mg total) by mouth 3 (three) times daily as needed for cough.   gabapentin 600 MG tablet Commonly known as: Neurontin 1/2 to 1 po tid    guaiFENesin 600 MG 12 hr tablet Commonly known as: MUCINEX Take 1 tablet (600 mg total) by mouth 2 (two) times daily for 5 days.   loratadine 10 MG tablet Commonly known as: CLARITIN Take 1 tablet (10 mg total) by mouth daily as needed for allergies.   Ocrevus 300 MG/10ML injection Generic drug: ocrelizumab HOLD WHILE ACTIVE INFECTION. PLEASE CHECK WITH YOUR NEUROLOGIST ON WHEN TO RESTART. What changed:  how much to take how to take this when to take this additional instructions   ondansetron 4 MG tablet Commonly known as: ZOFRAN Take 1 tablet (4 mg total) by mouth every 6 (six) hours as needed for nausea.   oseltamivir 75 MG capsule Commonly known as: TAMIFLU Take 1 capsule (75 mg total) by mouth 2 (two) times daily for 3 days.   polyethylene glycol 17 g packet Commonly known as: MIRALAX / GLYCOLAX Take 17 g by mouth daily as needed for mild constipation.        Discharge Exam: Filed Weights   04/15/22 1045  Weight: 63.5 kg   Vitals:   04/16/22 0237 04/16/22 0418  BP: 122/71   Pulse: 86   Resp: 18   Temp: 99.5 F (37.5 C)  SpO2: 99% 100%   Examination: Physical Exam:  Constitutional: WN/WD chronically ill-appearing African American male in no acute distress Respiratory: Diminished to auscultation bilaterally, no wheezing, rales, rhonchi or crackles. Normal respiratory effort and patient is not tachypenic. No accessory muscle use.  Unlabored breathing Cardiovascular: RRR, no murmurs / rubs / gallops. S1 and S2 auscultated. No extremity edema.  Abdomen: Soft, non-tender, non-distended. Bowel sounds positive.  GU: Deferred. Musculoskeletal: No clubbing / cyanosis of digits/nails. No joint deformity upper and lower extremities.  Skin: No rashes, lesions, ulcers on limited skin evaluation. No induration; Warm and dry.  Neurologic: CN 2-12 grossly intact with no focal deficits. Romberg sign and cerebellar reflexes not assessed.  Psychiatric: Normal judgment and  insight. Alert and oriented x 3. Normal mood and appropriate affect.   Condition at discharge: stable  The results of significant diagnostics from this hospitalization (including imaging, microbiology, ancillary and laboratory) are listed below for reference.   Imaging Studies: DG CHEST PORT 1 VIEW  Result Date: 04/16/2022 CLINICAL DATA:  Shortness of breath.  Influenza B. EXAM: PORTABLE CHEST 1 VIEW COMPARISON:  04/14/2022 FINDINGS: The heart size and mediastinal contours are within normal limits. Both lungs are clear. The visualized skeletal structures are unremarkable. IMPRESSION: No active disease. Electronically Signed   By: Danae Orleans M.D.   On: 04/16/2022 08:51   MR Brain W and Wo Contrast  Result Date: 04/14/2022 CLINICAL DATA:  Multiple sclerosis EXAM: MRI HEAD WITHOUT AND WITH CONTRAST TECHNIQUE: Multiplanar, multiecho pulse sequences of the brain and surrounding structures were obtained without and with intravenous contrast. CONTRAST:  40mL GADAVIST GADOBUTROL 1 MMOL/ML IV SOLN COMPARISON:  01/09/2022 FINDINGS: Brain: No acute infarct, mass effect or extra-axial collection. No acute or chronic hemorrhage. There are multiple hyperintense T2-weighted signal lesions within the periventricular and juxtacortical white matter. The number, size and distribution of lesions is unchanged from the most recent MRI. There are no contrast-enhancing lesions to indicate active demyelination. The midline structures are normal. Vascular: Major flow voids are preserved. Skull and upper cervical spine: Normal calvarium and skull base. Visualized upper cervical spine and soft tissues are normal. Sinuses/Orbits:No paranasal sinus fluid levels or advanced mucosal thickening. No mastoid or middle ear effusion. Normal orbits. IMPRESSION: Unchanged distribution of white matter lesions consistent with multiple sclerosis. No new or active demyelinating lesions. If Electronically Signed   By: Deatra Robinson M.D.   On:  04/14/2022 21:14   MR Cervical Spine W or Wo Contrast  Result Date: 04/14/2022 CLINICAL DATA:  Although sclerosis. Symptoms similar to prior flares. Flu B positive. Concern for new/recrudescence. EXAM: MRI CERVICAL SPINE WITHOUT AND WITH CONTRAST TECHNIQUE: Multiplanar and multiecho pulse sequences of the cervical spine, to include the craniocervical junction and cervicothoracic junction, were obtained without and with intravenous contrast. CONTRAST:  11mL GADAVIST GADOBUTROL 1 MMOL/ML IV SOLN COMPARISON:  MRI cervical spine 06/04/2020 FINDINGS: Alignment: There is again straightening of the normal cervical lordosis. No sagittal spondylolisthesis. Vertebrae: Vertebral body heights are maintained. Disc spaces are preserved. Mildly decreased disc hydration at C2-3 and even less decreased disc hydration at C3-4 through C6-7, similar to prior. Normal marrow signal. Cord: There is again diffuse patchy increased STIR signal seen throughout the visualized cervical cord. This includes increased STIR signal that involves the majority of the left-greater-than-right C2-3 through the inferior C3 levels, the mid and right dorsal aspect of the cord at the C3-4 level, diffuse cord at the C4-5 level, dorsal right cord at the C5-6 level, diffuse cord  at the C6 and C7 vertebral body levels, and mid and left aspect of the cord at the C7 and T1 level. No definite change from 06/04/2020. No definitive cord expansion is seen. Again postcontrast imaging is degraded by motion artifact, and again no definitive enhancing cord lesion is seen. Posterior Fossa, vertebral arteries, paraspinal tissues: Negative. Disc levels: C3-4: Mild bilateral facet joint hypertrophy, right-greater-than-left. Mild right uncovertebral hypertrophy. Minimal right neuroforaminal narrowing without true stenosis, unchanged. No central canal stenosis. C5-6: Mild bilateral facet joint hypertrophy. Mild broad-based posterior disc bulge. Minimal right neuroforaminal  narrowing without true stenosis. No central canal stenosis. No significant change. C6-7: Mild bilateral facet joint hypertrophy. Midline posterior annular disc tear and mild midline posterior disc protrusion. No central canal or neuroforaminal stenosis. IMPRESSION: 1. There is again extensive patchy increased STIR signal seen throughout the visualized cervical and upper thoracic spinal cord. No definite change. No definitive cord expansion or enhancing cord lesion is seen. 2. Within the limitations of motion artifact, no cord enhancement is seen to suggest active demyelination. Electronically Signed   By: Neita Garnet M.D.   On: 04/14/2022 21:13   X-ray chest PA and lateral  Result Date: 04/14/2022 CLINICAL DATA:  Shortness of breath. EXAM: CHEST - 2 VIEW COMPARISON:  June 30, 2021. FINDINGS: The heart size and mediastinal contours are within normal limits. Both lungs are clear. The visualized skeletal structures are unremarkable. IMPRESSION: No active cardiopulmonary disease. Electronically Signed   By: Lupita Raider M.D.   On: 04/14/2022 18:46    Microbiology: Results for orders placed or performed during the hospital encounter of 04/14/22  SARS Coronavirus 2 by RT PCR (hospital order, performed in [facility] hospital lab) *cepheid single result test* Anterior Nasal Swab     Status: None   Collection Time: 04/14/22  1:31 AM   Specimen: Anterior Nasal Swab  Result Value Ref Range Status   SARS Coronavirus 2 by RT PCR NEGATIVE NEGATIVE Final    Comment: (NOTE) SARS-CoV-2 target nucleic acids are NOT DETECTED.  The SARS-CoV-2 RNA is generally detectable in upper and lower respiratory specimens during the acute phase of infection. The lowest concentration of SARS-CoV-2 viral copies this assay can detect is 250 copies / mL. A negative result does not preclude SARS-CoV-2 infection and should not be used as the sole basis for treatment or other patient management decisions.  A negative  result may occur with improper specimen collection / handling, submission of specimen other than nasopharyngeal swab, presence of viral mutation(s) within the areas targeted by this assay, and inadequate number of viral copies (<250 copies / mL). A negative result must be combined with clinical observations, patient history, and epidemiological information.  Fact Sheet for Patients:   RoadLapTop.co.za  Fact Sheet for Healthcare Providers: http://kim-miller.com/  This test is not yet approved or  cleared by the [place] FDA and has been authorized for detection and/or diagnosis of SARS-CoV-2 by FDA under an Emergency Use Authorization (EUA).  This EUA will remain in effect (meaning this test can be used) for the duration of the COVID-19 declaration under Section 564(b)(1) of the Act, 21 U.S.C. section 360bbb-3(b)(1), unless the authorization is terminated or revoked sooner.  Performed at [facility], [address], [place], [place] [address]   Urine Culture     Status: None   Collection Time: 04/14/22  5:27 AM   Specimen: Urine, Clean Catch  Result Value Ref Range Status   Specimen Description   Final  URINE, CLEAN CATCH Performed at [facility], [address] [place], [place] [address]    Special Requests   Final    NONE Performed at [facility], [address], [place], [place] [address]    Culture   Final    NO GROWTH Performed at [facility], [address], [place], [place] [address]    Report Status 04/16/2022 FINAL  Final  Resp Panel by RT-PCR (Flu A&B, Covid) Anterior Nasal Swab     Status: Abnormal   Collection Time: 04/14/22  7:55 AM   Specimen: Anterior Nasal Swab  Result Value Ref Range Status   SARS Coronavirus 2 by RT PCR NEGATIVE NEGATIVE Final    Comment: (NOTE) SARS-CoV-2 target nucleic acids are NOT DETECTED.  The SARS-CoV-2 RNA is  generally detectable in upper respiratory specimens during the acute phase of infection. The lowest concentration of SARS-CoV-2 viral copies this assay can detect is 138 copies/mL. A negative result does not preclude SARS-Cov-2 infection and should not be used as the sole basis for treatment or other patient management decisions. A negative result may occur with  improper specimen collection/handling, submission of specimen other than nasopharyngeal swab, presence of viral mutation(s) within the areas targeted by this assay, and inadequate number of viral copies(<138 copies/mL). A negative result must be combined with clinical observations, patient history, and epidemiological information. The expected result is Negative.  Fact Sheet for Patients:  BloggerCourse.com  Fact Sheet for Healthcare Providers:  SeriousBroker.it  This test is no t yet approved or cleared by the [place] FDA and  has been authorized for detection and/or diagnosis of SARS-CoV-2 by FDA under an Emergency Use Authorization (EUA). This EUA will remain  in effect (meaning this test can be used) for the duration of the COVID-19 declaration under Section 564(b)(1) of the Act, 21 U.S.C.section 360bbb-3(b)(1), unless the authorization is terminated  or revoked sooner.       Influenza A by PCR NEGATIVE NEGATIVE Final   Influenza B by PCR POSITIVE (A) NEGATIVE Final    Comment: (NOTE) The Xpert Xpress SARS-CoV-2/FLU/RSV plus assay is intended as an aid in the diagnosis of influenza from Nasopharyngeal swab specimens and should not be used as a sole basis for treatment. Nasal washings and aspirates are unacceptable for Xpert Xpress SARS-CoV-2/FLU/RSV testing.  Fact Sheet for Patients: BloggerCourse.com  Fact Sheet for Healthcare Providers: SeriousBroker.it  This test is not yet approved or cleared by the  [place] FDA and has been authorized for detection and/or diagnosis of SARS-CoV-2 by FDA under an Emergency Use Authorization (EUA). This EUA will remain in effect (meaning this test can be used) for the duration of the COVID-19 declaration under Section 564(b)(1) of the Act, 21 U.S.C. section 360bbb-3(b)(1), unless the authorization is terminated or revoked.  Performed at [facility], [address], [place], [place] [address]    Labs: CBC: Recent Labs  Lab 04/14/22 1500 04/15/22 0500 04/16/22 0401  WBC 3.8* 2.6* 2.4*  NEUTROABS  --   --  0.6*  HGB 13.3 12.5* 13.4  HCT 40.6 38.6* 41.3  MCV 90.8 90.8 92.0  PLT 239 219 232   Basic Metabolic Panel: Recent Labs  Lab 04/14/22 1500 04/15/22 0500 04/16/22 0401  NA 138 140 141  K 4.0 3.9 3.9  CL 105 108 107  CO2 25 27 26   GLUCOSE 88 109* 95  BUN 9 7 5*  CREATININE 0.82 0.95 0.77  CALCIUM 8.5*  8.1* 8.5*  MG  --  1.8 2.0  PHOS  --  4.2 5.2*   Liver Function Tests: Recent Labs  Lab 04/15/22 0500 04/16/22 0401  AST 28 27  ALT 36 39  ALKPHOS 51 53  BILITOT 0.4 0.4  PROT 5.5* 6.1*  ALBUMIN 3.2* 3.7   CBG: Recent Labs  Lab 04/14/22 2148 04/15/22 0749  GLUCAP 100* 115*   Discharge time spent: greater than 30 minutes.  Signed: Marguerita Merles, DO [facility] 04/17/2022

## 2022-04-16 NOTE — Plan of Care (Signed)
  Problem: Health Behavior/Discharge Planning: Goal: Ability to manage health-related needs will improve Outcome: Progressing   Problem: Clinical Measurements: Goal: Ability to maintain clinical measurements within normal limits will improve Outcome: Progressing Goal: Will remain free from infection Outcome: Progressing Goal: Diagnostic test results will improve Outcome: Progressing   

## 2022-04-16 NOTE — Progress Notes (Signed)
Physical Therapy Treatment Patient Details Name: Mike Lowery MRN: [id number] DOB: May 30, 1985 Today's Date: 04/16/2022   History of Present Illness Patient is a 36 year old male who presented with weakness, sore throat, and numbness. Patient tested positive for influenza virus. Neurology reviewed imaging with no new evidence for active demyelination. PMH: MS    PT Comments    Pt is AxO x 3 very pleasant.  Eager to go home and "take a shower".  Assisted OOB to amb in hallway.  General bed mobility comments: self able with increased time and use of rails.  General transfer comment: 25% VC's on proper hand placement to steady self plus safety with turns using walker. General Gait Details: First amb with RW in hallway pt required MinGuard Assist with straight away but present with increased instability with turns.  VC's on proper walker to self distance and caution to spastic L LE.  Pt c/o Mod Fatigue (Influenza B) requiring a seated rest break.  Spouse asked about pt using a cane.  Trial amb with cane presented with even greater instability and near total LOB.  Advised pt/spouse to use walker for optimal amb safety then progress to a cane or NO AD at a later time.  Pt did NOT use any AD prior to admit. Pt plans to return home with family support.   Pt will need HH PT and a RW.  Reported to TOC.  Recommendations for follow up therapy are one component of a multi-disciplinary discharge planning process, led by the attending physician.  Recommendations may be updated based on patient status, additional functional criteria and insurance authorization.  Follow Up Recommendations  Home health PT     Assistance Recommended at Discharge Frequent or constant Supervision/Assistance  Patient can return home with the following A lot of help with walking and/or transfers;A little help with bathing/dressing/bathroom;Assistance with cooking/housework;Assist for transportation;Help with stairs or ramp for  entrance   Equipment Recommendations  Rolling walker (2 wheels)    Recommendations for Other Services       Precautions / Restrictions Precautions Precautions: Fall Precaution Comments: Hx MS onset muscular spacitity and weakness due to Influenza B Restrictions Weight Bearing Restrictions: No     Mobility  Bed Mobility Overal bed mobility: Needs Assistance Bed Mobility: Supine to Sit     Supine to sit: Supervision     General bed mobility comments: self able with increased time and use of rails    Transfers Overall transfer level: Needs assistance Equipment used: None, Rolling walker (2 wheels) Transfers: Sit to/from Stand             General transfer comment: 25% VC's on proper hand placement to steady self plus safety with turns using walker.    Ambulation/Gait Ambulation/Gait assistance: Min assist Gait Distance (Feet): 64 Feet Assistive device: Rolling walker (2 wheels), Straight cane Gait Pattern/deviations: Step-to pattern, Step-through pattern, Decreased stance time - left, Decreased step length - right, Decreased step length - left, Shuffle, Trunk flexed, Staggering left Gait velocity: decreased     General Gait Details: First amb with RW in hallway pt required MinGuard Assist with straight away but present with increased instability with turns.  VC's on proper walker to self distance and caution to spastic L LE.  Pt c/o Mod Fatigue (Influenza B) requiring a seated rest break.  Spouse asked about pt using a cane.  Trial amb with cane presented with even greater instability and near total LOB.  Advised pt/spouse to use walker  for optimal amb safety then progress to a cane or NO AD at a later time.  Pt did NOT use any AD prior to admit.   Stairs             Wheelchair Mobility    Modified Rankin (Stroke Patients Only)       Balance                                            Cognition Arousal/Alertness:  Awake/alert Behavior During Therapy: WFL for tasks assessed/performed Overall Cognitive Status: Within Functional Limits for tasks assessed                                 General Comments: AxO x 3 very pleasant/motivated        Exercises      General Comments        Pertinent Vitals/Pain Pain Assessment Pain Assessment: Faces Faces Pain Scale: Hurts a little bit Pain Location: left leg spasms Pain Descriptors / Indicators: Contraction, Tightness, Cramping Pain Intervention(s): Monitored during session    Home Living                          Prior Function            PT Goals (current goals can now be found in the care plan section) Progress towards PT goals: Progressing toward goals    Frequency    Min 3X/week      PT Plan Current plan remains appropriate    Co-evaluation              AM-PAC PT "6 Clicks" Mobility   Outcome Measure  Help needed turning from your back to your side while in a flat bed without using bedrails?: A Little Help needed moving from lying on your back to sitting on the side of a flat bed without using bedrails?: A Little Help needed moving to and from a bed to a chair (including a wheelchair)?: A Little Help needed standing up from a chair using your arms (e.g., wheelchair or bedside chair)?: A Little Help needed to walk in hospital room?: A Little Help needed climbing 3-5 steps with a railing? : A Little 6 Click Score: 18    End of Session Equipment Utilized During Treatment: Gait belt Activity Tolerance: Patient limited by fatigue;Patient limited by pain;Treatment limited secondary to medical complications (Comment) Patient left: in chair;with call bell/phone within reach;with chair alarm set;with family/visitor present Nurse Communication: Mobility status (reported to [facility], pt will need a RW) PT Visit Diagnosis: Unsteadiness on feet (R26.81);Difficulty in walking, not elsewhere classified  (R26.2);Other symptoms and signs involving the nervous system (R29.898);Pain Pain - Right/Left: Left Pain - part of body: Leg     Time: 9373-4287 PT Time Calculation (min) (ACUTE ONLY): 19 min  Charges:  $Gait Training: 8-22 mins                      Felecia Shelling  PTA Acute  Rehabilitation Services Office M-F          [phone] Weekend pager [phone]

## 2022-04-16 NOTE — TOC Initial Note (Signed)
Transition of Care [facility]) - Initial/Assessment Note    Patient Details  Name: Mike Lowery MRN: [id number] Date of Birth: [birth year]  Transition of Care [facility]) CM/SW Contact:    Golda Acre, RN Phone Number: 04/16/2022, 9:07 AM  Clinical Narrative:                 Patient with hx of ms, still working, smoker-smoking resources added to the AVS.  Expected Discharge Plan: Home/Self Care Barriers to Discharge: Continued Medical Work up   Patient Goals and CMS Choice Patient states their goals for this hospitalization and ongoing recovery are:: to get well and go home CMS Medicare.gov Compare Post Acute Care list provided to:: Patient Choice offered to / list presented to : Patient  Expected Discharge Plan and Services Expected Discharge Plan: Home/Self Care   Discharge Planning Services: CM Consult   Living arrangements for the past 2 months: Single Family Home                                      Prior Living Arrangements/Services Living arrangements for the past 2 months: Single Family Home Lives with:: Self Patient language and need for interpreter reviewed:: Yes Do you feel safe going back to the place where you live?: Yes            Criminal Activity/Legal Involvement Pertinent to Current Situation/Hospitalization: No - Comment as needed  Activities of Daily Living Home Assistive Devices/Equipment: None ADL Screening (condition at time of admission) Patient's cognitive ability adequate to safely complete daily activities?: Yes Is the patient deaf or have difficulty hearing?: No Does the patient have difficulty seeing, even when wearing glasses/contacts?: No Does the patient have difficulty concentrating, remembering, or making decisions?: No Patient able to express need for assistance with ADLs?: Yes Does the patient have difficulty dressing or bathing?: No Independently performs ADLs?: No Communication: Independent Dressing (OT): Needs  assistance Is this a change from baseline?: Change from baseline, expected to last <3days Grooming: Needs assistance Is this a change from baseline?: Change from baseline, expected to last <3 days Feeding: Independent Bathing: Needs assistance Is this a change from baseline?: Change from baseline, expected to last <3 days Toileting: Needs assistance Is this a change from baseline?: Change from baseline, expected to last <3 days In/Out Bed: Needs assistance Is this a change from baseline?: Change from baseline, expected to last <3 days Walks in Home: Needs assistance Is this a change from baseline?: Change from baseline, expected to last <3 days Does the patient have difficulty walking or climbing stairs?: Yes Weakness of Legs: Both Weakness of Arms/Hands: None  Permission Sought/Granted                  Emotional Assessment Appearance:: Appears stated age Attitude/Demeanor/Rapport: Engaged Affect (typically observed): Calm Orientation: : Oriented to Self, Oriented to Place, Oriented to  Time, Oriented to Situation Alcohol / Substance Use: Tobacco Use (current will add smoking cessesation to the avs.) Psych Involvement: No (comment)  Admission diagnosis:  Influenza B [J10.1] Left hemiparesis (HCC) [G81.94] Patient Active Problem List   Diagnosis Date Noted   Influenza B 04/14/2022   Acute left-sided weakness 04/14/2022   Left hemiparesis (HCC) 04/14/2022   Substance-induced anxiety disorder (HCC) 02/22/2022   Marijuana use 02/22/2022   MDD (major depressive disorder), recurrent episode, severe (HCC) 02/21/2022   Overdose of undetermined intent 02/21/2022   Urinary  hesitancy 12/29/2021   Acute COVID-19 06/30/2021   COVID-19 virus infection 06/04/2020   High risk sexual behavior 12/22/2017   Immunosuppression (HCC) 12/22/2017   Screening for human immunodeficiency virus 12/22/2017   High risk medication use 12/21/2016   Hepatitis B antibody positive 07/19/2016    Spasticity 06/17/2016   Spells 07/22/2015   Depression with anxiety 07/02/2015   Other fatigue 05/01/2015   Numbness 05/01/2015   Gait disturbance 05/01/2015   Disturbed cognition 05/01/2015   Erectile dysfunction 05/01/2015   Multiple sclerosis exacerbation (HCC) 03/11/2015   Tobacco abuse 03/06/2015   Nausea without vomiting 11/08/2014   Depression due to multiple sclerosis (HCC) 11/08/2014   Relapsing remitting multiple sclerosis (HCC) 10/01/2014   Swelling of both hands 05/14/2014   Multiple sclerosis (HCC) 12/24/2013   PCP:  Asa Lente, MD Pharmacy:   [facility] [phone] Ginette Otto, [place] - [address] AT [facility] [address]  [place] [address] Phone: [phone] Fax: [phone]  [facility] (prev. [facility]) [place], [place] - [address] [place] [address] Phone: [phone] Fax: [phone]  [facility] - [place], [place] - [facility] AT Portal to Registered [facility] [address] [place] [address] Phone: [phone] Fax: [phone]     Social Determinants of Health (SDOH) Interventions    Readmission Risk Interventions   No data to display

## 2022-04-16 NOTE — Progress Notes (Addendum)
Unable physically return adderall 20 mg to pyxis due to patient no longer being listed in pyxis. Spoke with pharmacy tech, Billey Chang R took medication to return meds in pharmacy. This note was written at 1956, unable to change time of note due to pt being discharged from system at 1457. [place]

## 2022-04-17 DIAGNOSIS — D72819 Decreased white blood cell count, unspecified: Secondary | ICD-10-CM | POA: Insufficient documentation

## 2022-04-21 ENCOUNTER — Emergency Department (HOSPITAL_COMMUNITY)
Admission: EM | Admit: 2022-04-21 | Discharge: 2022-04-22 | Discharge status: Against Medical Advice | Payer: Medicare Other | Attending: Emergency Medicine | Admitting: Emergency Medicine

## 2022-04-21 ENCOUNTER — Other Ambulatory Visit: Payer: Self-pay

## 2022-04-21 ENCOUNTER — Emergency Department (HOSPITAL_COMMUNITY): Payer: Medicare Other

## 2022-04-21 DIAGNOSIS — R61 Generalized hyperhidrosis: Secondary | ICD-10-CM | POA: Diagnosis not present

## 2022-04-21 DIAGNOSIS — M791 Myalgia, unspecified site: Secondary | ICD-10-CM | POA: Insufficient documentation

## 2022-04-21 DIAGNOSIS — R6883 Chills (without fever): Secondary | ICD-10-CM | POA: Diagnosis not present

## 2022-04-21 DIAGNOSIS — J029 Acute pharyngitis, unspecified: Secondary | ICD-10-CM | POA: Insufficient documentation

## 2022-04-21 DIAGNOSIS — Z5321 Procedure and treatment not carried out due to patient leaving prior to being seen by health care provider: Secondary | ICD-10-CM | POA: Insufficient documentation

## 2022-04-21 DIAGNOSIS — R0602 Shortness of breath: Secondary | ICD-10-CM | POA: Diagnosis not present

## 2022-04-21 DIAGNOSIS — R509 Fever, unspecified: Secondary | ICD-10-CM | POA: Diagnosis not present

## 2022-04-21 DIAGNOSIS — R059 Cough, unspecified: Secondary | ICD-10-CM | POA: Diagnosis not present

## 2022-04-21 DIAGNOSIS — R9431 Abnormal electrocardiogram [ECG] [EKG]: Secondary | ICD-10-CM | POA: Diagnosis not present

## 2022-04-21 LAB — CBC WITH DIFFERENTIAL/PLATELET
Abs Immature Granulocytes: 0.03 10*3/uL (ref 0.00–0.07)
Basophils Absolute: 0 10*3/uL (ref 0.0–0.1)
Basophils Relative: 0 %
Eosinophils Absolute: 0.1 10*3/uL (ref 0.0–0.5)
Eosinophils Relative: 1 %
HCT: 39 % (ref 39.0–52.0)
Hemoglobin: 13.5 g/dL (ref 13.0–17.0)
Immature Granulocytes: 0 %
Lymphocytes Relative: 16 %
Lymphs Abs: 1.5 10*3/uL (ref 0.7–4.0)
MCH: 30.4 pg (ref 26.0–34.0)
MCHC: 34.6 g/dL (ref 30.0–36.0)
MCV: 87.8 fL (ref 80.0–100.0)
Monocytes Absolute: 1.3 10*3/uL — ABNORMAL HIGH (ref 0.1–1.0)
Monocytes Relative: 14 %
Neutro Abs: 6.5 10*3/uL (ref 1.7–7.7)
Neutrophils Relative %: 69 %
Platelets: 348 10*3/uL (ref 150–400)
RBC: 4.44 MIL/uL (ref 4.22–5.81)
RDW: 12.5 % (ref 11.5–15.5)
WBC: 9.4 10*3/uL (ref 4.0–10.5)
nRBC: 0 % (ref 0.0–0.2)

## 2022-04-21 MED ORDER — HYDROCODONE-ACETAMINOPHEN 5-325 MG PO TABS
1.0000 | ORAL_TABLET | Freq: Once | ORAL | Status: AC
  Administered 2022-04-21: 1 via ORAL
  Filled 2022-04-21: qty 1

## 2022-04-21 NOTE — ED Provider Triage Note (Signed)
Emergency Medicine Provider Triage Evaluation Note  Mike Lowery , a 36 y.o. male  was evaluated in triage.  Pt complains of persistent chills, sweats, body aches, cough, and sore throat. Tested positive for influenza on 04/14/22; discharged from the hospital on 04/16/22. Has been taking tylenol and Mucinex for symptoms without relief. Feels so weak that he is unable to care for himself, worsening mobility. Uses a cane PRN at baseline.  Hx of relapsing and remitting MS. Stable MRIs brain and C-spine on 04/14/22.  Review of Systems  Positive: As above Negative: As above  Physical Exam  BP 126/72 (BP Location: Left Arm)   Pulse 100   Temp 99.1 F (37.3 C)   Resp 17   SpO2 95%  Gen:   Awake, no distress   Resp:  Normal effort  MSK:   Moves extremities without difficulty  Other:  3-3+/5 strength in the LLE with 4+/5 strength in the LUE. R side with preserved 5/5 strength against resistance. Reports decreased sensation to light touch on the left side compared to right. No other appreciable deficits.  Medical Decision Making  Medically screening exam initiated at 11:10 PM.  Appropriate orders placed.  Mike Lowery was informed that the remainder of the evaluation will be completed by another provider, this initial triage assessment does not replace that evaluation, and the importance of remaining in the ED until their evaluation is complete.  Influenza - labs and CXR ordered for trending. VSS. No criteria for SIRS/sepsis at this time.   Antony Madura, PA-C 04/21/22 2314

## 2022-04-21 NOTE — ED Triage Notes (Signed)
Pt complaining of headache, fevers and body ahces that have been ongoing for 1wk States he has been taking OTC medications with minimal improvement   Was seen at [facility] 11/28 for same symptoms

## 2022-04-22 LAB — BASIC METABOLIC PANEL
Anion gap: 10 (ref 5–15)
BUN: 7 mg/dL (ref 6–20)
CO2: 24 mmol/L (ref 22–32)
Calcium: 8.6 mg/dL — ABNORMAL LOW (ref 8.9–10.3)
Chloride: 104 mmol/L (ref 98–111)
Creatinine, Ser: 0.78 mg/dL (ref 0.61–1.24)
GFR, Estimated: >60 mL/min (ref >60)
Glucose, Bld: 91 mg/dL (ref 70–99)
Potassium: 3.9 mmol/L (ref 3.5–5.1)
Sodium: 138 mmol/L (ref 135–145)

## 2022-04-22 LAB — CK: Total CK: 66 U/L (ref 49–397)

## 2022-04-22 NOTE — ED Notes (Signed)
Pt didn't answer for vital check 

## 2022-04-22 NOTE — ED Notes (Signed)
Called patient several times for vitals patient didn't answer 

## 2022-04-23 ENCOUNTER — Other Ambulatory Visit: Payer: Self-pay | Admitting: Neurology

## 2022-04-23 MED ORDER — AMPHETAMINE-DEXTROAMPHET ER 20 MG PO CP24: 20.0000 mg | ORAL_CAPSULE | Freq: Every day | ORAL | 0 refills | Status: DC

## 2022-04-23 NOTE — Telephone Encounter (Signed)
Last seen 03/19/22 and next f/u 09/24/22. Per drug registry, last refilled 03/23/22 #30.

## 2022-04-23 NOTE — Telephone Encounter (Signed)
Pt is calling. Requesting a refill on medication amphetamine-dextroamphetamine (ADDERALL XR) 20 MG 24 hr capsule. Refill should be sent to [facility] [phone]

## 2022-05-04 ENCOUNTER — Telehealth: Payer: Self-pay

## 2022-05-04 NOTE — Telephone Encounter (Signed)
        Patient  visited [place] on 12/6    Telephone encounter attempt :  1st  A HIPAA compliant voice message was left requesting a return call.  Instructed patient to call back    [facility], [place], Care Management  [phone] [address], [place], [place] [address] Phone: [phone] Email: [email or web address]       

## 2022-05-05 ENCOUNTER — Telehealth: Payer: Self-pay

## 2022-05-05 NOTE — Telephone Encounter (Signed)
        Patient  visited [place] on 12/6   Telephone encounter attempt :  2nd  A HIPAA compliant voice message was left requesting a return call.  Instructed patient to call back     [facility]ahze[facility], , Care Management  [phone] [address], [place], [place] [address] Phone: [phone] Email: [email or web address]       

## 2022-05-28 ENCOUNTER — Telehealth: Payer: Self-pay | Admitting: Neurology

## 2022-05-28 ENCOUNTER — Other Ambulatory Visit: Payer: Self-pay | Admitting: Neurology

## 2022-05-28 DIAGNOSIS — R5383 Other fatigue: Secondary | ICD-10-CM

## 2022-05-28 DIAGNOSIS — G35 Multiple sclerosis: Secondary | ICD-10-CM

## 2022-05-28 DIAGNOSIS — R269 Unspecified abnormalities of gait and mobility: Secondary | ICD-10-CM

## 2022-05-28 DIAGNOSIS — R3911 Hesitancy of micturition: Secondary | ICD-10-CM

## 2022-05-28 DIAGNOSIS — R252 Cramp and spasm: Secondary | ICD-10-CM

## 2022-05-28 MED ORDER — AMPHETAMINE-DEXTROAMPHET ER 20 MG PO CP24: 20.0000 mg | ORAL_CAPSULE | Freq: Every day | ORAL | 0 refills | Status: DC

## 2022-05-28 NOTE — Telephone Encounter (Signed)
Khadijah CMA called the patient back to confirm what services he wanted [place] to order. He is asking for [facility] PT and [place] aide. I will place the referral for that and forward the refill request to Dr Felecia Shelling

## 2022-05-28 NOTE — Telephone Encounter (Signed)
[facility] is going to take this patient. 

## 2022-05-28 NOTE — Telephone Encounter (Signed)
Pt called stating that he would like to put in for a request to have a home aid and also he is needing a refill on his  amphetamine-dextroamphetamine (ADDERALL XR) 20 MG 24 hr capsule sent in to the [facility] on [address]

## 2022-06-03 ENCOUNTER — Telehealth: Payer: Self-pay | Admitting: Neurology

## 2022-06-03 DIAGNOSIS — Z720 Tobacco use: Secondary | ICD-10-CM | POA: Diagnosis not present

## 2022-06-03 DIAGNOSIS — G35 Multiple sclerosis: Secondary | ICD-10-CM | POA: Diagnosis not present

## 2022-06-03 DIAGNOSIS — G8194 Hemiplegia, unspecified affecting left nondominant side: Secondary | ICD-10-CM | POA: Diagnosis not present

## 2022-06-03 DIAGNOSIS — Z8616 Personal history of COVID-19: Secondary | ICD-10-CM | POA: Diagnosis not present

## 2022-06-03 DIAGNOSIS — Z9181 History of falling: Secondary | ICD-10-CM | POA: Diagnosis not present

## 2022-06-03 DIAGNOSIS — K219 Gastro-esophageal reflux disease without esophagitis: Secondary | ICD-10-CM | POA: Diagnosis not present

## 2022-06-03 NOTE — Telephone Encounter (Signed)
Called and spoke with [facility] and provided VO's. She verbalized understanding.

## 2022-06-03 NOTE — Telephone Encounter (Signed)
Tye [place], RN with [facility]  has called for verbal orders for skilled Nursing 1 week 4 PT 1 week 1 and OT 1 week 1 [facility] ph [phone] is secure voicemail

## 2022-06-18 ENCOUNTER — Telehealth: Payer: Self-pay | Admitting: *Deleted

## 2022-06-18 NOTE — Telephone Encounter (Signed)
Forms faxed to [employer] (fax# [phone])

## 2022-06-18 NOTE — Telephone Encounter (Signed)
Gave completed/signed request for independent assessment for personal care services attestation of medical need form back to medical records to process for pt.

## 2022-06-21 ENCOUNTER — Ambulatory Visit (HOSPITAL_COMMUNITY)
Admission: EM | Admit: 2022-06-21 | Discharge: 2022-06-21 | Disposition: A | Discharge status: Home / Self Care | Payer: 59 | Attending: Physician Assistant | Admitting: Physician Assistant

## 2022-06-21 ENCOUNTER — Other Ambulatory Visit: Payer: Self-pay

## 2022-06-21 ENCOUNTER — Encounter (HOSPITAL_COMMUNITY): Payer: Self-pay | Admitting: *Deleted

## 2022-06-21 DIAGNOSIS — J069 Acute upper respiratory infection, unspecified: Secondary | ICD-10-CM | POA: Diagnosis not present

## 2022-06-21 DIAGNOSIS — M2550 Pain in unspecified joint: Secondary | ICD-10-CM | POA: Insufficient documentation

## 2022-06-21 DIAGNOSIS — M791 Myalgia, unspecified site: Secondary | ICD-10-CM | POA: Diagnosis not present

## 2022-06-21 DIAGNOSIS — Z1152 Encounter for screening for COVID-19: Secondary | ICD-10-CM | POA: Diagnosis not present

## 2022-06-21 LAB — POC INFLUENZA A AND B ANTIGEN (URGENT CARE ONLY)
INFLUENZA A ANTIGEN, POC: NEGATIVE
INFLUENZA B ANTIGEN, POC: NEGATIVE

## 2022-06-21 MED ORDER — PREDNISONE 10 MG PO TABS: 20.0000 mg | ORAL_TABLET | Freq: Every day | ORAL | 0 refills | Status: DC

## 2022-06-21 NOTE — ED Triage Notes (Signed)
Pt reports since last night he has had a coyug,sore throat,body aches,runny nose and fatigue.

## 2022-06-21 NOTE — Discharge Instructions (Addendum)
Contact neurologist after hour center for further instructions, go to ED if you cannot contact neurologist

## 2022-06-21 NOTE — ED Provider Notes (Signed)
[place]    CSN: [id number] Arrival date & time: 06/21/22  1551      History   Chief Complaint Chief Complaint  Patient presents with   Cough   Fatigue   Generalized Body Aches   Sore Throat   Nasal Congestion    HPI Mike Lowery is a 38 y.o. male.   Patient here c/w "Sick" x 1 day . Admits nasal congestion, rhinorrhea, sore throat, cough, myalgia, arthalgia.  He states he has MS and is having an MS flare.  He has not contacted his neurologist.  He states he's unable to walk due to the pain. Pain currently 8/10 on pain scale.    Past Medical History:  Diagnosis Date   Depression    situaltional   Dyspnea    Multiple sclerosis ([place]) 12/24/13   Multiple sclerosis ([place])    Vision abnormalities     Patient Active Problem List   Diagnosis Date Noted   Leukopenia 04/17/2022   Hyperphosphatemia 04/17/2022   Influenza B 04/14/2022   Acute left-sided weakness 04/14/2022   Left hemiparesis ([place]) 04/14/2022   Substance-induced anxiety disorder ([place]) 02/22/2022   Marijuana use 02/22/2022   MDD (major depressive disorder), recurrent episode, severe ([place]) 02/21/2022   Overdose of undetermined intent 02/21/2022   Urinary hesitancy 12/29/2021   Acute COVID-19 06/30/2021   COVID-19 virus infection 06/04/2020   High risk sexual behavior 12/22/2017   Immunosuppression ([place]) 12/22/2017   Screening for human immunodeficiency virus 12/22/2017   High risk medication use 12/21/2016   Hepatitis B antibody positive 07/19/2016   Spasticity 06/17/2016   Spells 07/22/2015   Depression with anxiety 07/02/2015   Other fatigue 05/01/2015   Numbness 05/01/2015   Gait disturbance 05/01/2015   Disturbed cognition 05/01/2015   Erectile dysfunction 05/01/2015   Multiple sclerosis exacerbation ([place]) 03/11/2015   Tobacco abuse 03/06/2015   Nausea without vomiting 11/08/2014   Depression due to multiple sclerosis ([place]) 11/08/2014   Relapsing remitting multiple sclerosis  ([place]) 10/01/2014   Swelling of both hands 05/14/2014   Multiple sclerosis ([place]) 12/24/2013    Past Surgical History:  Procedure Laterality Date   NO PAST SURGERIES         Home Medications    Prior to Admission medications   Medication Sig Start Date End Date Taking? Authorizing Provider  predniSONE (DELTASONE) 10 MG tablet Take 2 tablets (20 mg total) by mouth daily. 06/21/22  Yes Peri Jefferson, PA-C  acetaminophen (TYLENOL) 325 MG tablet Take 2 tablets (650 mg total) by mouth every 6 (six) hours as needed for fever. 04/16/22   Sheikh, Omair Latif, DO  amphetamine-dextroamphetamine (ADDERALL XR) 20 MG 24 hr capsule Take 1 capsule (20 mg total) by mouth daily. 05/28/22   Sater, Nanine Means, MD  baclofen (LIORESAL) 20 MG tablet Take 1 tablet (20 mg total) by mouth 3 (three) times daily. 03/19/22   Sater, Nanine Means, MD  benzonatate (TESSALON) 200 MG capsule Take 1 capsule (200 mg total) by mouth 3 (three) times daily as needed for cough. 04/16/22   Raiford Noble Latif, DO  gabapentin (NEURONTIN) 600 MG tablet 1/2 to 1 po tid 03/19/22   Sater, Nanine Means, MD  loratadine (CLARITIN) 10 MG tablet Take 1 tablet (10 mg total) by mouth daily as needed for allergies. 04/16/22   Sheikh, Omair Latif, DO  ocrelizumab (OCREVUS) 300 MG/10ML injection HOLD WHILE ACTIVE INFECTION. PLEASE CHECK WITH YOUR NEUROLOGIST ON WHEN TO RESTART. Patient taking differently: Inject 300 mg into  the vein every 6 (six) months. 07/02/21   Mariel Aloe, MD  ondansetron (ZOFRAN) 4 MG tablet Take 1 tablet (4 mg total) by mouth every 6 (six) hours as needed for nausea. 04/16/22   Sheikh, Georgina Quint Latif, DO  polyethylene glycol (MIRALAX / GLYCOLAX) 17 g packet Take 17 g by mouth daily as needed for mild constipation. 04/16/22   Kerney Elbe, DO    Family History Family History  Problem Relation Age of Onset   Healthy Mother    Healthy Father    Cancer Maternal Grandmother        unknown    Ataxia Sister    Multiple  sclerosis Neg Hx     Social History Social History   Tobacco Use   Smoking status: Former    Types: Cigarettes    Quit date: 04/18/2013    Years since quitting: 9.1   Smokeless tobacco: Former    Quit date: 09/08/2014  Substance Use Topics   Alcohol use: Yes   Drug use: No    Types: Marijuana    Comment: former     Allergies   Patient has no known allergies.   Review of Systems Review of Systems  Constitutional:  Negative for chills, fatigue and fever.  HENT:  Positive for congestion, postnasal drip, rhinorrhea and sore throat. Negative for sinus pressure and sinus pain.   Respiratory:  Positive for cough. Negative for shortness of breath and wheezing.   Gastrointestinal:  Negative for nausea and vomiting.  Musculoskeletal:  Positive for arthralgias, back pain, gait problem and myalgias. Negative for joint swelling.  Skin:  Negative for color change.  Neurological:  Positive for weakness. Negative for numbness.  Psychiatric/Behavioral:  Negative for sleep disturbance.      Physical Exam Triage Vital Signs ED Triage Vitals  Enc Vitals Group     BP 06/21/22 1715 127/76     Pulse Rate 06/21/22 1715 90     Resp 06/21/22 1715 20     Temp 06/21/22 1715 99.9 F (37.7 C)     Temp src --      SpO2 06/21/22 1715 96 %     Weight --      Height --      Head Circumference --      Peak Flow --      Pain Score 06/21/22 1713 9     Pain Loc --      Pain Edu? --      Excl. in [place]? --    No data found.  Updated Vital Signs BP 127/76   Pulse 90   Temp 99.9 F (37.7 C)   Resp 20   SpO2 96%   Visual Acuity Right Eye Distance:   Left Eye Distance:   Bilateral Distance:    Right Eye Near:   Left Eye Near:    Bilateral Near:     Physical Exam Vitals and nursing note reviewed.  Constitutional:      General: He is not in acute distress.    Appearance: Normal appearance. He is not ill-appearing.  HENT:     Head: Normocephalic and atraumatic.     Right Ear:  Tympanic membrane and ear canal normal.     Left Ear: Tympanic membrane and ear canal normal.     Nose: Rhinorrhea present. No congestion.     Mouth/Throat:     Pharynx: No oropharyngeal exudate or posterior oropharyngeal erythema.  Eyes:     General: No scleral icterus.  Extraocular Movements: Extraocular movements intact.     Conjunctiva/sclera: Conjunctivae normal.  Cardiovascular:     Rate and Rhythm: Normal rate and regular rhythm.     Heart sounds: No murmur heard. Pulmonary:     Effort: Pulmonary effort is normal. No respiratory distress.     Breath sounds: Normal breath sounds. No wheezing or rales.  Musculoskeletal:     Cervical back: Normal range of motion. No rigidity.  Skin:    Capillary Refill: Capillary refill takes less than 2 seconds.     Coloration: Skin is not jaundiced.     Findings: No rash.  Neurological:     General: No focal deficit present.     Mental Status: He is alert and oriented to person, place, and time.     Motor: No weakness.     Gait: Gait normal.  Psychiatric:        Mood and Affect: Mood normal.        Behavior: Behavior normal.      UC Treatments / Results  Labs (all labs ordered are listed, but only abnormal results are displayed) Labs Reviewed  SARS CORONAVIRUS 2 (TAT 6-24 HRS)  POC INFLUENZA A AND B ANTIGEN (URGENT CARE ONLY)    EKG   Radiology No results found.  Procedures Procedures (including critical care time)  Medications Ordered in UC Medications - No data to display  Initial Impression / Assessment and Plan / UC Course  I have reviewed the triage vital signs and the nursing notes.  Pertinent labs & imaging results that were available during my care of the patient were reviewed by me and considered in my medical decision making (see chart for details).     Advised patient to go to ED due to MS relapse, patient declined, he stated, "I will wait in the lobby until tomorrow morning I will start patient on  prednisone today, recommend he contact his neurologist for further advice Likely viral URI   Final Clinical Impressions(s) / UC Diagnoses   Final diagnoses:  Myalgia  Viral upper respiratory tract infection     Discharge Instructions      Contact neurologist after hour center for further instructions, go to ED if you cannot contact neurologist   ED Prescriptions     Medication Sig Dispense Auth. Provider   predniSONE (DELTASONE) 10 MG tablet Take 2 tablets (20 mg total) by mouth daily. 6 tablet Peri Jefferson, PA-C      PDMP not reviewed this encounter.   Peri Jefferson, PA-C 06/21/22 1837

## 2022-06-22 LAB — SARS CORONAVIRUS 2 (TAT 6-24 HRS): SARS Coronavirus 2: NEGATIVE

## 2022-06-25 DIAGNOSIS — Z9181 History of falling: Secondary | ICD-10-CM | POA: Diagnosis not present

## 2022-06-25 DIAGNOSIS — K219 Gastro-esophageal reflux disease without esophagitis: Secondary | ICD-10-CM | POA: Diagnosis not present

## 2022-06-25 DIAGNOSIS — Z8616 Personal history of COVID-19: Secondary | ICD-10-CM | POA: Diagnosis not present

## 2022-06-25 DIAGNOSIS — Z720 Tobacco use: Secondary | ICD-10-CM | POA: Diagnosis not present

## 2022-06-25 DIAGNOSIS — G8194 Hemiplegia, unspecified affecting left nondominant side: Secondary | ICD-10-CM | POA: Diagnosis not present

## 2022-06-25 DIAGNOSIS — G35 Multiple sclerosis: Secondary | ICD-10-CM | POA: Diagnosis not present

## 2022-07-01 ENCOUNTER — Ambulatory Visit: Payer: Medicare Other | Admitting: Neurology

## 2022-07-02 ENCOUNTER — Encounter: Payer: Self-pay | Admitting: *Deleted

## 2022-07-12 ENCOUNTER — Ambulatory Visit (HOSPITAL_COMMUNITY)
Admission: EM | Admit: 2022-07-12 | Discharge: 2022-07-12 | Disposition: A | Discharge status: Home / Self Care | Payer: 59 | Attending: Physician Assistant | Admitting: Physician Assistant

## 2022-07-12 ENCOUNTER — Other Ambulatory Visit: Payer: Self-pay

## 2022-07-12 ENCOUNTER — Encounter (HOSPITAL_COMMUNITY): Payer: Self-pay | Admitting: *Deleted

## 2022-07-12 DIAGNOSIS — J069 Acute upper respiratory infection, unspecified: Secondary | ICD-10-CM

## 2022-07-12 DIAGNOSIS — G35 Multiple sclerosis: Secondary | ICD-10-CM | POA: Insufficient documentation

## 2022-07-12 DIAGNOSIS — J029 Acute pharyngitis, unspecified: Secondary | ICD-10-CM | POA: Diagnosis not present

## 2022-07-12 LAB — POCT RAPID STREP A, ED / UC: Streptococcus, Group A Screen (Direct): NEGATIVE

## 2022-07-12 MED ORDER — PREDNISONE 10 MG PO TABS: 20.0000 mg | ORAL_TABLET | Freq: Every day | ORAL | 0 refills | Status: DC

## 2022-07-12 NOTE — ED Triage Notes (Signed)
Pt sitting by door but did not answer when name was called.

## 2022-07-12 NOTE — ED Provider Notes (Signed)
[facility]    CSN: [id number] Arrival date & time: 07/12/22  1005      History   Chief Complaint Chief Complaint  Patient presents with   Sore Throat   Headache    HPI Mike Lowery is a 37 y.o. male.   37 year old male presents with sore throat, congestion.  Patient indicates he has a history of MS.  He is concerned that he may be having a mild flare with the discomfort, fatigue body aches that he is having.  He indicates over the past 2 days he has been having sore throat with progressive pain on swallowing.  Mild upper respiratory congestion with rhinitis and postnasal drip mainly green production.  He also indicates having mild cough and chest congestion with similar production.  He has not have any wheezing or shortness of breath.  He is without fever or chills.  He indicates he has not been around any coworkers, family or friends with similar symptoms.  He is tolerating fluids well.   Sore Throat Associated symptoms include headaches.  Headache Associated symptoms: cough and sore throat     Past Medical History:  Diagnosis Date   Depression    situaltional   Dyspnea    Multiple sclerosis ([place]) 12/24/13   Multiple sclerosis ([place])    Vision abnormalities     Patient Active Problem List   Diagnosis Date Noted   Leukopenia 04/17/2022   Hyperphosphatemia 04/17/2022   Influenza B 04/14/2022   Acute left-sided weakness 04/14/2022   Left hemiparesis ([place]) 04/14/2022   Substance-induced anxiety disorder ([place]) 02/22/2022   Marijuana use 02/22/2022   MDD (major depressive disorder), recurrent episode, severe ([place]) 02/21/2022   Overdose of undetermined intent 02/21/2022   Urinary hesitancy 12/29/2021   Acute COVID-19 06/30/2021   COVID-19 virus infection 06/04/2020   High risk sexual behavior 12/22/2017   Immunosuppression ([place]) 12/22/2017   Screening for human immunodeficiency virus 12/22/2017   High risk medication use 12/21/2016   Hepatitis B antibody  positive 07/19/2016   Spasticity 06/17/2016   Spells 07/22/2015   Depression with anxiety 07/02/2015   Other fatigue 05/01/2015   Numbness 05/01/2015   Gait disturbance 05/01/2015   Disturbed cognition 05/01/2015   Erectile dysfunction 05/01/2015   Multiple sclerosis exacerbation ([place]) 03/11/2015   Tobacco abuse 03/06/2015   Nausea without vomiting 11/08/2014   Depression due to multiple sclerosis ([place]) 11/08/2014   Relapsing remitting multiple sclerosis ([place]) 10/01/2014   Swelling of both hands 05/14/2014   Multiple sclerosis ([place]) 12/24/2013    Past Surgical History:  Procedure Laterality Date   NO PAST SURGERIES         Home Medications    Prior to Admission medications   Medication Sig Start Date End Date Taking? Authorizing Provider  acetaminophen (TYLENOL) 325 MG tablet Take 2 tablets (650 mg total) by mouth every 6 (six) hours as needed for fever. 04/16/22   Sheikh, Omair Latif, DO  amphetamine-dextroamphetamine (ADDERALL XR) 20 MG 24 hr capsule Take 1 capsule (20 mg total) by mouth daily. 05/28/22   Sater, Nanine Means, MD  baclofen (LIORESAL) 20 MG tablet Take 1 tablet (20 mg total) by mouth 3 (three) times daily. 03/19/22   Sater, Nanine Means, MD  benzonatate (TESSALON) 200 MG capsule Take 1 capsule (200 mg total) by mouth 3 (three) times daily as needed for cough. 04/16/22   Raiford Noble Latif, DO  gabapentin (NEURONTIN) 600 MG tablet 1/2 to 1 po tid 03/19/22   Arlice Colt  A, MD  loratadine (CLARITIN) 10 MG tablet Take 1 tablet (10 mg total) by mouth daily as needed for allergies. 04/16/22   Sheikh, Omair Latif, DO  ocrelizumab (OCREVUS) 300 MG/10ML injection HOLD WHILE ACTIVE INFECTION. PLEASE CHECK WITH YOUR NEUROLOGIST ON WHEN TO RESTART. Patient taking differently: Inject 300 mg into the vein every 6 (six) months. 07/02/21   Mariel Aloe, MD  ondansetron (ZOFRAN) 4 MG tablet Take 1 tablet (4 mg total) by mouth every 6 (six) hours as needed for nausea. 04/16/22    Sheikh, Georgina Quint Latif, DO  polyethylene glycol (MIRALAX / GLYCOLAX) 17 g packet Take 17 g by mouth daily as needed for mild constipation. 04/16/22   Sheikh, Omair Latif, DO  predniSONE (DELTASONE) 10 MG tablet Take 2 tablets (20 mg total) by mouth daily. 07/12/22   Nyoka Lint, PA-C    Family History Family History  Problem Relation Age of Onset   Healthy Mother    Healthy Father    Cancer Maternal Grandmother        unknown    Ataxia Sister    Multiple sclerosis Neg Hx     Social History Social History   Tobacco Use   Smoking status: Former    Types: Cigarettes    Quit date: 04/18/2013    Years since quitting: 9.2   Smokeless tobacco: Former    Quit date: 09/08/2014  Substance Use Topics   Alcohol use: Yes   Drug use: No    Types: Marijuana    Comment: former     Allergies   Patient has no known allergies.   Review of Systems Review of Systems  HENT:  Positive for sore throat.   Respiratory:  Positive for cough.   Neurological:  Positive for headaches.     Physical Exam Triage Vital Signs ED Triage Vitals  Enc Vitals Group     BP 07/12/22 1033 112/74     Pulse Rate 07/12/22 1033 85     Resp 07/12/22 1033 18     Temp 07/12/22 1033 98.1 F (36.7 C)     Temp src --      SpO2 07/12/22 1033 98 %     Weight --      Height --      Head Circumference --      Peak Flow --      Pain Score 07/12/22 1032 7     Pain Loc --      Pain Edu? --      Excl. in [place]? --    No data found.  Updated Vital Signs BP 112/74   Pulse 85   Temp 98.1 F (36.7 C)   Resp 18   SpO2 98%   Visual Acuity Right Eye Distance:   Left Eye Distance:   Bilateral Distance:    Right Eye Near:   Left Eye Near:    Bilateral Near:     Physical Exam Constitutional:      Appearance: He is well-developed.  HENT:     Right Ear: Tympanic membrane and ear canal normal.     Left Ear: Tympanic membrane and ear canal normal.     Mouth/Throat:     Mouth: Mucous membranes are moist.      Pharynx: Oropharynx is clear.  Cardiovascular:     Rate and Rhythm: Normal rate and regular rhythm.     Heart sounds: Normal heart sounds.  Pulmonary:     Effort: Pulmonary effort is normal.  Breath sounds: Normal breath sounds and air entry. No wheezing, rhonchi or rales.  Lymphadenopathy:     Cervical: No cervical adenopathy.  Neurological:     Mental Status: He is alert.      UC Treatments / Results  Labs (all labs ordered are listed, but only abnormal results are displayed) Labs Reviewed  CULTURE, GROUP A STREP [facility])  POCT RAPID STREP A, ED / UC    EKG   Radiology No results found.  Procedures Procedures (including critical care time)  Medications Ordered in UC Medications - No data to display  Initial Impression / Assessment and Plan / UC Course  I have reviewed the triage vital signs and the nursing notes.  Pertinent labs & imaging results that were available during my care of the patient were reviewed by me and considered in my medical decision making (see chart for details).    Plan: The diagnosis will be treated with the following: 1.  Sore throat: A.  Advised to use ibuprofen or Tylenol along with gargles and lozenges to help soothe the throat discomfort. 2.  Upper respiratory tract infection: A.  Advised to use OTC cough preparations to help control symptoms of congestion and cough. 3.  MS: Flare: A.  Advised take prednisone 10 mg, 2 tablets daily for 6 days. 4.  Advised follow-up PCP return to urgent care as needed. Final Clinical Impressions(s) / UC Diagnoses   Final diagnoses:  Sore throat  Viral upper respiratory tract infection  MS (multiple sclerosis) ([place])     Discharge Instructions      Advised to use Tylenol or ibuprofen as needed for fever, body aches and discomfort. Advised to use salt water gargles and lozenges to help soothe the throat discomfort.  Advised take prednisone 10 mg, 2 tablets daily until completed to help reduce  MS flare.  Advised follow-up PCP or return to urgent care as needed.     ED Prescriptions     Medication Sig Dispense Auth. Provider   predniSONE (DELTASONE) 10 MG tablet Take 2 tablets (20 mg total) by mouth daily. 6 tablet Nyoka Lint, PA-C      PDMP not reviewed this encounter.   Nyoka Lint, PA-C 07/12/22 1106

## 2022-07-12 NOTE — Discharge Instructions (Addendum)
Advised to use Tylenol or ibuprofen as needed for fever, body aches and discomfort. Advised to use salt water gargles and lozenges to help soothe the throat discomfort.  Advised take prednisone 10 mg, 2 tablets daily until completed to help reduce MS flare.  Advised follow-up PCP or return to urgent care as needed.

## 2022-07-12 NOTE — ED Triage Notes (Signed)
Pt reports a possible flare up of MS . Pt wants steroids like he got on his last visit.

## 2022-07-12 NOTE — ED Triage Notes (Signed)
Pt called 3 times by 2 different people with no answer.

## 2022-07-14 LAB — CULTURE, GROUP A STREP (THRC)

## 2022-07-27 ENCOUNTER — Encounter (HOSPITAL_COMMUNITY)
Admission: RE | Admit: 2022-07-27 | Discharge: 2022-07-27 | Disposition: A | Discharge status: Home / Self Care | Payer: 59 | Source: Ambulatory Visit | Attending: Neurology | Admitting: Neurology

## 2022-07-27 DIAGNOSIS — G35 Multiple sclerosis: Secondary | ICD-10-CM | POA: Diagnosis not present

## 2022-07-27 MED ORDER — DIPHENHYDRAMINE HCL 50 MG/ML IJ SOLN
INTRAMUSCULAR | Status: AC
  Administered 2022-07-27: 50 mg via INTRAVENOUS
  Filled 2022-07-27: qty 1

## 2022-07-27 MED ORDER — DIPHENHYDRAMINE HCL 50 MG/ML IJ SOLN: 50.0000 mg | Freq: Once | INTRAMUSCULAR | Status: AC

## 2022-07-27 MED ORDER — SODIUM CHLORIDE 0.9 % IV SOLN: Freq: Once | INTRAVENOUS | Status: AC

## 2022-07-27 MED ORDER — FAMOTIDINE IN NACL 20-0.9 MG/50ML-% IV SOLN
INTRAVENOUS | Status: AC
  Administered 2022-07-27: 20 mg via INTRAVENOUS
  Filled 2022-07-27: qty 50

## 2022-07-27 MED ORDER — ACETAMINOPHEN 325 MG PO TABS: 650.0000 mg | ORAL_TABLET | Freq: Once | ORAL | Status: AC

## 2022-07-27 MED ORDER — METHYLPREDNISOLONE SODIUM SUCC 125 MG IJ SOLR: 125.0000 mg | Freq: Once | INTRAMUSCULAR | Status: AC

## 2022-07-27 MED ORDER — ACETAMINOPHEN 325 MG PO TABS
ORAL_TABLET | ORAL | Status: AC
  Administered 2022-07-27: 650 mg via ORAL
  Filled 2022-07-27: qty 2

## 2022-07-27 MED ORDER — FAMOTIDINE IN NACL 20-0.9 MG/50ML-% IV SOLN: 20.0000 mg | Freq: Once | INTRAVENOUS | Status: AC

## 2022-07-27 MED ORDER — SODIUM CHLORIDE 0.9 % IV SOLN
600.0000 mg | INTRAVENOUS | Status: DC
  Administered 2022-07-27: 600 mg via INTRAVENOUS
  Filled 2022-07-27: qty 20

## 2022-07-27 MED ORDER — METHYLPREDNISOLONE SODIUM SUCC 125 MG IJ SOLR
INTRAMUSCULAR | Status: AC
  Administered 2022-07-27: 125 mg via INTRAVENOUS
  Filled 2022-07-27: qty 2

## 2022-07-27 NOTE — Progress Notes (Signed)
PT here for ocrevus.  Has received it several times and we have been infusing it in over 2 hours per protocol since he has had no reactions.  Order for today said to run in over 180 min. Order received today from Debbora Presto NP that it is okay to run it in over 2 hours like we have been

## 2022-09-14 ENCOUNTER — Encounter (HOSPITAL_COMMUNITY): Payer: Self-pay | Admitting: Emergency Medicine

## 2022-09-14 ENCOUNTER — Ambulatory Visit (HOSPITAL_COMMUNITY)
Admission: EM | Admit: 2022-09-14 | Discharge: 2022-09-14 | Disposition: A | Discharge status: Home / Self Care | Payer: 59 | Attending: Nurse Practitioner | Admitting: Nurse Practitioner

## 2022-09-14 ENCOUNTER — Other Ambulatory Visit: Payer: Self-pay

## 2022-09-14 DIAGNOSIS — M545 Low back pain, unspecified: Secondary | ICD-10-CM

## 2022-09-14 DIAGNOSIS — G35 Multiple sclerosis: Secondary | ICD-10-CM | POA: Diagnosis not present

## 2022-09-14 DIAGNOSIS — M542 Cervicalgia: Secondary | ICD-10-CM | POA: Diagnosis not present

## 2022-09-14 DIAGNOSIS — G8929 Other chronic pain: Secondary | ICD-10-CM | POA: Diagnosis not present

## 2022-09-14 MED ORDER — PREDNISONE 20 MG PO TABS: 40.0000 mg | ORAL_TABLET | Freq: Every day | ORAL | 0 refills | Status: AC

## 2022-09-14 NOTE — ED Notes (Signed)
After discharge by other, escorted patient to his car.  Patient walks with a cane on routine daily basis.  Wanted to make sure unfamiliar landscape did not cause issue.  Left patient sitting in his car.

## 2022-09-14 NOTE — ED Provider Notes (Signed)
[facility]    CSN: [id number] Arrival date & time: 09/14/22  1146      History   Chief Complaint Chief Complaint  Patient presents with   Back Pain    HPI Mike Lowery is a 37 y.o. male.   Patient presents today with 2-day history of back, neck pain and overall feeling "weak."  Reports he feels he is having a flare of MS.  No unilateral weakness, vision changes, trouble with speech, or trouble swallowing.  No fever, chills, cough, sore throat, congestion.  No runny or stuffy nose, chest pain, shortness of breath, new bowel or bladder issues.  No abdominal pain, nausea/vomiting, diarrhea.  Patient reports history of falls at baseline secondary to multiple sclerosis.  Reports he has been using his cane as recommended by his neurologist.  Reports neurology follow-up is in a couple of weeks, has not tried to contact neurologist office yet.  Reports he recently had infusion, no recent changes in medication.  This appears to be a second exacerbation of MS in the past 3 months.     Past Medical History:  Diagnosis Date   Depression    situaltional   Dyspnea    Multiple sclerosis (HCC) 12/24/13   Multiple sclerosis (HCC)    Vision abnormalities     Patient Active Problem List   Diagnosis Date Noted   Leukopenia 04/17/2022   Hyperphosphatemia 04/17/2022   Influenza B 04/14/2022   Acute left-sided weakness 04/14/2022   Left hemiparesis (HCC) 04/14/2022   Substance-induced anxiety disorder (HCC) 02/22/2022   Marijuana use 02/22/2022   MDD (major depressive disorder), recurrent episode, severe (HCC) 02/21/2022   Overdose of undetermined intent 02/21/2022   Urinary hesitancy 12/29/2021   Acute COVID-19 06/30/2021   COVID-19 virus infection 06/04/2020   High risk sexual behavior 12/22/2017   Immunosuppression (HCC) 12/22/2017   Screening for human immunodeficiency virus 12/22/2017   High risk medication use 12/21/2016   Hepatitis B antibody positive 07/19/2016    Spasticity 06/17/2016   Spells 07/22/2015   Depression with anxiety 07/02/2015   Other fatigue 05/01/2015   Numbness 05/01/2015   Gait disturbance 05/01/2015   Disturbed cognition 05/01/2015   Erectile dysfunction 05/01/2015   Multiple sclerosis exacerbation (HCC) 03/11/2015   Tobacco abuse 03/06/2015   Nausea without vomiting 11/08/2014   Depression due to multiple sclerosis (HCC) 11/08/2014   Relapsing remitting multiple sclerosis (HCC) 10/01/2014   Swelling of both hands 05/14/2014   Multiple sclerosis (HCC) 12/24/2013    Past Surgical History:  Procedure Laterality Date   NO PAST SURGERIES         Home Medications    Prior to Admission medications   Medication Sig Start Date End Date Taking? Authorizing Provider  predniSONE (DELTASONE) 20 MG tablet Take 2 tablets (40 mg total) by mouth daily with breakfast for 5 days. 09/14/22 09/19/22 Yes Valentino Nose, NP  acetaminophen (TYLENOL) 325 MG tablet Take 2 tablets (650 mg total) by mouth every 6 (six) hours as needed for fever. 04/16/22   Sheikh, Omair Latif, DO  amphetamine-dextroamphetamine (ADDERALL XR) 20 MG 24 hr capsule Take 1 capsule (20 mg total) by mouth daily. 05/28/22   Sater, Pearletha Furl, MD  baclofen (LIORESAL) 20 MG tablet Take 1 tablet (20 mg total) by mouth 3 (three) times daily. 03/19/22   Sater, Pearletha Furl, MD  benzonatate (TESSALON) 200 MG capsule Take 1 capsule (200 mg total) by mouth 3 (three) times daily as needed for cough. 04/16/22  Sheikh, Omair Loretto, DO  gabapentin (NEURONTIN) 600 MG tablet 1/2 to 1 po tid 03/19/22   Sater, Pearletha Furl, MD  loratadine (CLARITIN) 10 MG tablet Take 1 tablet (10 mg total) by mouth daily as needed for allergies. 04/16/22   Sheikh, Omair Latif, DO  ocrelizumab (OCREVUS) 300 MG/10ML injection HOLD WHILE ACTIVE INFECTION. PLEASE CHECK WITH YOUR NEUROLOGIST ON WHEN TO RESTART. Patient taking differently: Inject 300 mg into the vein every 6 (six) months. 07/02/21   Narda Bonds,  MD  ondansetron (ZOFRAN) 4 MG tablet Take 1 tablet (4 mg total) by mouth every 6 (six) hours as needed for nausea. 04/16/22   Sheikh, Kateri Mc Latif, DO  polyethylene glycol (MIRALAX / GLYCOLAX) 17 g packet Take 17 g by mouth daily as needed for mild constipation. 04/16/22   Merlene Laughter, DO    Family History Family History  Problem Relation Age of Onset   Healthy Mother    Healthy Father    Cancer Maternal Grandmother        unknown    Ataxia Sister    Multiple sclerosis Neg Hx     Social History Social History   Tobacco Use   Smoking status: Some Days    Types: Cigarettes, Cigars    Last attempt to quit: 04/18/2013    Years since quitting: 9.4   Smokeless tobacco: Former    Quit date: 09/08/2014  Vaping Use   Vaping Use: Some days  Substance Use Topics   Alcohol use: Yes   Drug use: No    Types: Marijuana    Comment: former     Allergies   Patient has no known allergies.   Review of Systems Review of Systems Per HPI  Physical Exam Triage Vital Signs ED Triage Vitals  Enc Vitals Group     BP 09/14/22 1346 124/72     Pulse Rate 09/14/22 1346 70     Resp 09/14/22 1346 18     Temp 09/14/22 1346 98 F (36.7 C)     Temp Source 09/14/22 1346 Oral     SpO2 09/14/22 1346 96 %     Weight --      Height --      Head Circumference --      Peak Flow --      Pain Score 09/14/22 1344 7     Pain Loc --      Pain Edu? --      Excl. in GC? --    No data found.  Updated Vital Signs BP 124/72 (BP Location: Right Arm)   Pulse 70   Temp 98 F (36.7 C) (Oral)   Resp 18   SpO2 96%   Visual Acuity Right Eye Distance:   Left Eye Distance:   Bilateral Distance:    Right Eye Near:   Left Eye Near:    Bilateral Near:     Physical Exam Vitals and nursing note reviewed.  Constitutional:      General: He is not in acute distress.    Appearance: Normal appearance. He is not ill-appearing, toxic-appearing or diaphoretic.  HENT:     Head: Normocephalic and  atraumatic.     Nose: Nose normal. No congestion or rhinorrhea.     Mouth/Throat:     Mouth: Mucous membranes are moist.     Pharynx: Oropharynx is clear. No posterior oropharyngeal erythema.  Eyes:     General: No scleral icterus.    Extraocular Movements: Extraocular movements intact.  Pupils: Pupils are equal, round, and reactive to light.  Cardiovascular:     Rate and Rhythm: Normal rate and regular rhythm.  Pulmonary:     Effort: Pulmonary effort is normal. No respiratory distress.     Breath sounds: Normal breath sounds. No wheezing, rhonchi or rales.  Musculoskeletal:     Cervical back: Normal range of motion and neck supple. No rigidity or tenderness.  Lymphadenopathy:     Cervical: No cervical adenopathy.  Skin:    General: Skin is warm and dry.     Capillary Refill: Capillary refill takes less than 2 seconds.     Coloration: Skin is not jaundiced or pale.     Findings: No erythema.  Neurological:     General: No focal deficit present.     Mental Status: He is alert and oriented to person, place, and time.     Cranial Nerves: Cranial nerves 2-12 are intact.     Sensory: Sensation is intact.     Motor: Motor function is intact. No weakness.     Coordination: Coordination is intact. Coordination normal. Heel to Shin Test normal. Rapid alternating movements normal.     Gait: Gait abnormal (walks with cane - baseline per patient).  Psychiatric:        Behavior: Behavior is cooperative.      UC Treatments / Results  Labs (all labs ordered are listed, but only abnormal results are displayed) Labs Reviewed - No data to display  EKG   Radiology No results found.  Procedures Procedures (including critical care time)  Medications Ordered in UC Medications - No data to display  Initial Impression / Assessment and Plan / UC Course  I have reviewed the triage vital signs and the nursing notes.  Pertinent labs & imaging results that were available during my care  of the patient were reviewed by me and considered in my medical decision making (see chart for details).   Patient is well-appearing, normotensive, afebrile, not tachycardic, not tachypneic, oxygenating well on room air.    1. Chronic midline low back pain without sciatica 2. Neck pain 3. Personal history of multiple sclerosis (HCC) Noted flags in history-examination is reassuring today Patient is neurovascularly intact Will treat with prednisone 40 mg daily for 5 days Recommended follow-up with neurologist earlier than planned if needed for persistent symptoms If symptoms worsen or stroke symptoms develop, strict ER precautions discussed with patient  The patient was given the opportunity to ask questions.  All questions answered to their satisfaction.  The patient is in agreement to this plan.    Final Clinical Impressions(s) / UC Diagnoses   Final diagnoses:  Chronic midline low back pain without sciatica  Neck pain  Personal history of multiple sclerosis (HCC)     Discharge Instructions      Please take the prednisone to help with your back and neck pain.  If this does not help with symptoms or if your symptoms recur prior to your next appointment with your neurologist, please reach out to them to make a follow-up appointment.  If symptoms worsen in the meantime or you develop severe weakness on one side of your body, facial drooping, other stroke symptoms, please go to the emergency room.    ED Prescriptions     Medication Sig Dispense Auth. Provider   predniSONE (DELTASONE) 20 MG tablet Take 2 tablets (40 mg total) by mouth daily with breakfast for 5 days. 10 tablet Valentino Nose, NP  PDMP not reviewed this encounter.   Valentino Nose, NP 09/14/22 [phone]

## 2022-09-14 NOTE — Discharge Instructions (Signed)
Please take the prednisone to help with your back and neck pain.  If this does not help with symptoms or if your symptoms recur prior to your next appointment with your neurologist, please reach out to them to make a follow-up appointment.  If symptoms worsen in the meantime or you develop severe weakness on one side of your body, facial drooping, other stroke symptoms, please go to the emergency room.

## 2022-09-14 NOTE — ED Triage Notes (Signed)
Complains of back, neck and knees feeling weak.  Symptoms started 2 days ago.  Patient ambulates with a cane on a routine basis.  Patient reports he falls frequently.

## 2022-09-24 ENCOUNTER — Ambulatory Visit: Payer: Medicare Other | Admitting: Neurology

## 2022-09-25 ENCOUNTER — Other Ambulatory Visit: Payer: Self-pay | Admitting: Neurology

## 2022-09-25 NOTE — Telephone Encounter (Signed)
Pt requesting a refill on amphetamine-dextroamphetamine (ADDERALL XR) 20 MG 24 hr capsule. Should be sent to [facility] [phone]

## 2022-09-25 NOTE — Telephone Encounter (Signed)
Pt last seen 03/19/22 and next f/u 10/09/22. Last refilled rx 05/28/22 #30.

## 2022-09-26 MED ORDER — AMPHETAMINE-DEXTROAMPHET ER 20 MG PO CP24: 20.0000 mg | ORAL_CAPSULE | Freq: Every day | ORAL | 0 refills | Status: DC

## 2022-10-09 ENCOUNTER — Encounter: Payer: Self-pay | Admitting: Neurology

## 2022-10-09 ENCOUNTER — Ambulatory Visit: Payer: Medicare Other | Admitting: Neurology

## 2022-12-16 ENCOUNTER — Encounter: Payer: Self-pay | Admitting: Neurology

## 2022-12-16 ENCOUNTER — Ambulatory Visit (INDEPENDENT_AMBULATORY_CARE_PROVIDER_SITE_OTHER): Payer: 59 | Admitting: Neurology

## 2022-12-16 VITALS — BP 111/73 | HR 73 | Ht 66.0 in | Wt 153.5 lb

## 2022-12-16 DIAGNOSIS — R252 Cramp and spasm: Secondary | ICD-10-CM | POA: Diagnosis not present

## 2022-12-16 DIAGNOSIS — R269 Unspecified abnormalities of gait and mobility: Secondary | ICD-10-CM

## 2022-12-16 DIAGNOSIS — R5383 Other fatigue: Secondary | ICD-10-CM

## 2022-12-16 DIAGNOSIS — G35 Multiple sclerosis: Secondary | ICD-10-CM

## 2022-12-16 DIAGNOSIS — F32A Depression, unspecified: Secondary | ICD-10-CM | POA: Diagnosis not present

## 2022-12-16 DIAGNOSIS — Z79899 Other long term (current) drug therapy: Secondary | ICD-10-CM

## 2022-12-16 DIAGNOSIS — G4700 Insomnia, unspecified: Secondary | ICD-10-CM

## 2022-12-16 MED ORDER — AMPHETAMINE-DEXTROAMPHET ER 20 MG PO CP24: 20.0000 mg | ORAL_CAPSULE | Freq: Every day | ORAL | 0 refills | Status: DC

## 2022-12-16 MED ORDER — TIZANIDINE HCL 4 MG PO TABS: ORAL_TABLET | ORAL | 0 refills | Status: DC

## 2022-12-16 NOTE — Progress Notes (Signed)
[facility]  PATIENT: Mike Lowery DOB: 08/29/1985  REFERRING DOCTOR OR PCP:  Dr. Hyman Hopes SOURCE: Patient, family, records in the EMR, MRI images on PACS  _________________________________   HISTORICAL  CHIEF COMPLAINT:  Chief Complaint  Patient presents with   Room 10    Pt is here Alone. Pt states that he's not getting any rest due to his spasms throughout his whole body.  Pt states that his face is breaking out. Pt states the last month or so he has been feeling really bad. Pt states that he has a lot of Fatigue.     HISTORY OF PRESENT ILLNESS:  Mike Lowery,is a 37 y.o. man with relapsing remitting multiple sclerosis.    Update  12/16/2022: He is on Ocrevus and tolerates it well.   His last infusion was 12/2021    He has not had any exacerbation while on it.      IgM and IgA were reduced at last visit.  IgM was 14.  IgG was normal.  MRI of the brain 01/09/2022 showed no new lesions.  He reports more spasms in his legs > shoulders.  This is worse at night. Left side is worse.   Sometimes walking triggers the spasms.    He is on baclofen 20 mg tid.   He has not been on tizanidine.     His gait is slightly worse and he uses the cane daily.  Dalfampridine had not helped s was stopped.  The left side is weaker than right and has more spasticity.     He has pain,  numbness and tingling in the left leg.   Marland Kitchen  He has mild urinary hesitancy that is stable.    Vision is doing well.  He notes mild fatigue that fluctuates a lot.  Adderall helps some.    He avoids going outside in the heat.   Due to dysesthesias and spasms, he sometimes sleeps poorly- sleep mainenance worse than onset.    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   MS HISTORY:  In July/Aug 2015, he had the onset of weakness and numbness in the arms  and legs, a little worse on the left.   When symptoms persisted, he presented to [facility] emergency room. MRI was consistent with multiple sclerosis and he was admitted. The MRI of the brain showed several plaques, mostly in the periventricular white matter. The MRI of the cervical spine showed several large T2 hyperintense foci, with some enhancement. He was given IV Solu-Medrol and his symptoms improved quite a bit. He followed up with Dr. Everlena Cooper and was placed on Plegridy about 8 or 9 months after starting Plegridy, he had another exacerbation and MRI of the brain and cervical spine were performed again 02/21/2015. The MRI of the cervical spine does not show any definite new lesions. However, the MRI of the brain does show several new lesions including for small enhancing foci. He was switched to [facility] November or December 2016.   I have personally reviewed the MRIs of the brain and cervical spine from October 2016. The MRI of the brain shows multiple T2/FLAIR hyperintense foci consistent with MS in the hemispheres and brainstem.   4 of the hemispheric small foci enhanced after gadolinium administration. The  MRI of the cervical spine shows multiple hyperintense foci. There was a normal enhancement pattern.   He had a large exacerbation while on Tecfidera with new gait difficulties March 2018.   MRI's show multiple new lesions in the brain with several lesions enhancing including a larger one in the left periventrivcular white matter.    Also has a new focus adjacent to C4 in the spinal cord.  He was switched to ocrelizumab and had his first infusion at the end of March 2018.     MRI images MRI of the brain 06/04/2020 showed T2/flair hyperintense foci in the brainstem, left cerebellar hemisphere, thalamus and the hemispheres.  None of the foci enhanced.  No changes compared to the 12/28/2019 MRI.  MRI of the cervical spine 06/04/2020 and 07/16/2016 showed multiple T2 hyperintense foci within the spinal cord.   They are somewhat confluent and primarily located posteriorly adjacent to C2, posteriorly and more to the left adjacent to C3-C4, posteriorly more to the right adjacent to C4-C5, posteriorly adjacent to C5-C6, more to the left adjacent to C6-C7 and posteriorly adjacent to C7-T1, adjacent to T1.  No enhancing lesions.  No change between 2018 and 2022.  MRI of the thoracic spine 06/04/2020 showed multiple T2 hyperintense foci within the spinal cord, no comparison films.  Number the foci appear to be acute.  They do not enhance.  MRI of the brain 12/28/2019 showed multiple T2/FLAIR hyperintense foci in the brainstem, left cerebellar hemisphere, left thalamus and in the periventricular, juxtacortical and deep white matter both hemispheres in a pattern and configuration consistent with chronic demyelinating plaque associated with multiple sclerosis.  None of the foci appear to be acute and they do not enhance.  Compared to the MRI from 01/01/2018, there are no new lesions.  Normal enhancement pattern and no acute findings  MRI of the brain 01/10/2022 shows T2/FLAIR hyperintense foci in the brainstem, cerebellum, left thalamus and hemispheres in a pattern and configuration consistent with chronic demyelinating plaque associated with multiple sclerosis.  None of the foci appear to be acute.  They do not enhance.  Compared to the MRI from 06/04/2020, there are no new lesions.   REVIEW OF SYSTEMS: Constitutional: No fevers, chills, sweats, or change in appetite.   He has fatigue and sleepiness. Eyes: No visual changes, double vision, eye pain Ear, nose and throat: No hearing loss, ear pain, nasal congestion, sore throat Cardiovascular: No chest pain, palpitations Respiratory:  No shortness of breath at rest or with exertion.   No wheezes GastrointestinaI: No nausea, vomiting, diarrhea, abdominal pain, fecal incontinence Genitourinary:  He notes urinary frequency and ED.    Musculoskeletal:  No neck pain, back  pain Integumentary: No rash, pruritus, skin lesions Neurological: as above Psychiatric: See above Endocrine: No palpitations, diaphoresis, change in appetite, change in weigh or increased thirst Hematologic/Lymphatic:  No anemia, purpura, petechiae. Allergic/Immunologic: No itchy/runny eyes, nasal congestion, recent allergic reactions, rashes  ALLERGIES: No Known Allergies  HOME MEDICATIONS:  Current Outpatient Medications:    acetaminophen (TYLENOL) 325 MG tablet, Take 2 tablets (650 mg total) by mouth every 6 (six) hours as needed for fever., Disp: 20 tablet, Rfl: 0   baclofen (LIORESAL) 20 MG tablet, Take 1 tablet (20 mg total) by mouth 3 (three) times daily., Disp: 90 tablet, Rfl: 11   gabapentin (NEURONTIN) 600 MG tablet, 1/2 to 1 po tid, Disp: 90 tablet, Rfl: 11   loratadine (CLARITIN) 10 MG tablet, Take 1 tablet (10 mg total) by mouth  daily as needed for allergies., Disp: 30 tablet, Rfl: 0   ocrelizumab (OCREVUS) 300 MG/10ML injection, HOLD WHILE ACTIVE INFECTION. PLEASE CHECK WITH YOUR NEUROLOGIST ON WHEN TO RESTART. (Patient taking differently: Inject 300 mg into the vein every 6 (six) months.), Disp: 20 mL, Rfl: 0   tiZANidine (ZANAFLEX) 4 MG tablet, Take 1/2 to 1 po tid, Disp: 30 tablet, Rfl: 0   amphetamine-dextroamphetamine (ADDERALL XR) 20 MG 24 hr capsule, Take 1 capsule (20 mg total) by mouth daily., Disp: 30 capsule, Rfl: 0  PAST MEDICAL HISTORY: Past Medical History:  Diagnosis Date   Depression    situaltional   Dyspnea    Multiple sclerosis (HCC) 12/24/13   Multiple sclerosis (HCC)    Vision abnormalities     PAST SURGICAL HISTORY: Past Surgical History:  Procedure Laterality Date   NO PAST SURGERIES      FAMILY HISTORY: Family History  Problem Relation Age of Onset   Healthy Mother    Healthy Father    Cancer Maternal Grandmother        unknown    Ataxia Sister    Multiple sclerosis Neg Hx     SOCIAL HISTORY:  Social History   Socioeconomic  History   Marital status: Single    Spouse name: Not on file   Number of children: Not on file   Years of education: Not on file   Highest education level: Not on file  Occupational History   Not on file  Tobacco Use   Smoking status: Former    Current packs/day: 0.00    Types: Cigarettes, Cigars    Quit date: 04/18/2013    Years since quitting: 9.6   Smokeless tobacco: Former    Quit date: 09/08/2014  Vaping Use   Vaping status: Some Days  Substance and Sexual Activity   Alcohol use: Yes   Drug use: No    Types: Marijuana    Comment: former   Sexual activity: Yes    Partners: Female  Other Topics Concern   Not on file  Social History Narrative   Right Handed    Occasional Coffee Drinker   Occasional Soda Drinker   Social Determinants of Health   Financial Resource Strain: Not on file  Food Insecurity: No Food Insecurity (04/15/2022)   Hunger Vital Sign    Worried About Running Out of Food in the Last Year: Never true    Ran Out of Food in the Last Year: Never true  Transportation Needs: No Transportation Needs (04/15/2022)   PRAPARE - Administrator, Civil Service (Medical): No    Lack of Transportation (Non-Medical): No  Physical Activity: Not on file  Stress: Not on file  Social Connections: Not on file  Intimate Partner Violence: Not At Risk (04/15/2022)   Humiliation, Afraid, Rape, and Kick questionnaire    Fear of Current or Ex-Partner: No    Emotionally Abused: No    Physically Abused: No    Sexually Abused: No     PHYSICAL EXAM  Vitals:   12/16/22 1056  BP: 111/73  Pulse: 73  Weight: 153 lb 8 oz (69.6 kg)  Height: 5\' 6"  (1.676 m)     Body mass index is 24.78 kg/m.   General: The patient is well-developed and well-nourished and in no acute distress   Skin: Extremities are without rash or edema.  Neurologic Exam  Mental status: Affect is flatter than typical.  he patient is alert and oriented x 3 at the  time of the  examination. The patient has apparent normal recent and remote memory, with a reduced attention span and concentration ability.   Speech is normal.  Cranial nerves: Extraocular movements are full.  Colors are desaturated OD relative to OS. Facial strength is normal.  Trapezius strength is normal..   No obvious hearing deficits are noted.  Motor:   Muscle bulk is normal.   He has increased muscle tone in the legs, left > right  Strength is 5/5.  Sensory:  He reports normal and symmetric sensation to touch and vibration in the arms but reduced left leg vibration and touch  Coordination: On cerebellar testing finger-nose-finger is normal.  Heel-to-shin is mildly reduced.  Gait and station: He needs to use one arm to stand from chair.  Station is normal.   The gait is wide and spastic (left) and he had a slight foot drop on the left.  Cannot tandem walk Romberg is borderline.   Reflexes: Deep tendon reflexes are symmetric and 3 in arms.  He has increased reflexes in the legs, left > right but no ankle clonus.         DIAGNOSTIC DATA (LABS, IMAGING, TESTING) - I reviewed patient records, labs, notes, testing and imaging myself where available.  Lab Results  Component Value Date   WBC 9.4 04/21/2022   HGB 13.5 04/21/2022   HCT 39.0 04/21/2022   MCV 87.8 04/21/2022   PLT 348 04/21/2022      Component Value Date/Time   NA 138 04/21/2022 2320   NA 142 12/29/2021 1104   K 3.9 04/21/2022 2320   CL 104 04/21/2022 2320   CO2 24 04/21/2022 2320   GLUCOSE 91 04/21/2022 2320   BUN 7 04/21/2022 2320   BUN 9 12/29/2021 1104   CREATININE 0.78 04/21/2022 2320   CREATININE 0.92 03/27/2015 1130   CALCIUM 8.6 (L) 04/21/2022 2320   PROT 6.1 (L) 04/16/2022 0401   PROT 6.4 12/29/2021 1104   ALBUMIN 3.7 04/16/2022 0401   ALBUMIN 4.7 12/29/2021 1104   AST 27 04/16/2022 0401   ALT 39 04/16/2022 0401   ALKPHOS 53 04/16/2022 0401   BILITOT 0.4 04/16/2022 0401   BILITOT 0.6 12/29/2021 1104    GFRNONAA >60 04/21/2022 2320   GFRNONAA >89 10/10/2014 1251   GFRAA 103 06/22/2019 0850   GFRAA >89 10/10/2014 1251       ASSESSMENT AND PLAN    1. Multiple sclerosis (HCC)   2. High risk medication use   3. Other fatigue   4. Gait disturbance   5. Spasticity   6. Depression, unspecified depression type   7. Insomnia, unspecified type      1.   He will continue Ocrevus.  His next infusion will be in August 2024.  Check IgG/IgM and CD19/20.  Marland KitchenAround the time of next visit, can check MRI brain/cervical 2.   Renew Adderall for attention deficit and fatigue 3.    Continue baclofen for spasticity up to 20 mg 3 times daily   Add tizanidine 2-4 mg po tid --- nighttime dose should help insomnia some.     4.    Mood is worse (depression)  He prefers not to start and antidepressant or see BH.   5.    Return to see me in 6 months or sooner if there are new or worsening symptoms.  Laverna Dossett A. Epimenio Foot, MD, PhD 12/16/2022, 11:22 AM Certified in Neurology, Clinical Neurophysiology, Sleep Medicine, Pain Medicine and Neuroimaging  [facility]  [address], [address] [place], [place] [address] [phone]

## 2022-12-31 ENCOUNTER — Telehealth: Payer: Self-pay | Admitting: Neurology

## 2022-12-31 ENCOUNTER — Other Ambulatory Visit (INDEPENDENT_AMBULATORY_CARE_PROVIDER_SITE_OTHER): Payer: Self-pay

## 2022-12-31 DIAGNOSIS — G35 Multiple sclerosis: Secondary | ICD-10-CM | POA: Diagnosis not present

## 2022-12-31 DIAGNOSIS — Z79899 Other long term (current) drug therapy: Secondary | ICD-10-CM | POA: Diagnosis not present

## 2022-12-31 DIAGNOSIS — Z0289 Encounter for other administrative examinations: Secondary | ICD-10-CM

## 2022-12-31 DIAGNOSIS — R799 Abnormal finding of blood chemistry, unspecified: Secondary | ICD-10-CM | POA: Diagnosis not present

## 2022-12-31 NOTE — Telephone Encounter (Signed)
Patient is requesting a home aide and wanted to know if Dr. Epimenio Foot can advise how to proceed. Please call to discuss. Pt also filled out a handicap placard form that was put in Dr. Narda Bonds.

## 2023-01-02 LAB — CBC WITH DIFFERENTIAL/PLATELET
EOS (ABSOLUTE): 0.1 10*3/uL (ref 0.0–0.4)
Immature Grans (Abs): 0 10*3/uL (ref 0.0–0.1)
RDW: 12.4 % (ref 11.6–15.4)

## 2023-01-04 ENCOUNTER — Other Ambulatory Visit: Payer: Self-pay

## 2023-01-05 ENCOUNTER — Telehealth: Payer: Self-pay | Admitting: Neurology

## 2023-01-05 DIAGNOSIS — G35 Multiple sclerosis: Secondary | ICD-10-CM

## 2023-01-05 DIAGNOSIS — R269 Unspecified abnormalities of gait and mobility: Secondary | ICD-10-CM

## 2023-01-05 DIAGNOSIS — R252 Cramp and spasm: Secondary | ICD-10-CM

## 2023-01-05 NOTE — Telephone Encounter (Signed)
Per Dr. Epimenio Foot " He does have significant impairments from him MS so I am fine with home health order being placed.   Referral  placed and Placard filled out.

## 2023-01-05 NOTE — Telephone Encounter (Signed)
I let [facility] know that PT and OT have been added.

## 2023-01-05 NOTE — Telephone Encounter (Signed)
Received VO from Dr. Epimenio Foot to add on PT/OT in addition to [facility] aide. Order updated.

## 2023-01-05 NOTE — Telephone Encounter (Signed)
He has been with [facility] and they can take him but they sent this message: Would have to add PT/OT or RN-can't do just an aide

## 2023-01-05 NOTE — Addendum Note (Signed)
Addended by: Aura Camps on: 01/05/2023 09:10 AM   Modules accepted: Orders

## 2023-01-05 NOTE — Addendum Note (Signed)
Addended by: Geronimo Running A on: 01/05/2023 11:33 AM   Modules accepted: Orders

## 2023-01-08 NOTE — Telephone Encounter (Signed)
[facility] Mike Lowery) we are having a hard time contacting the patient. Hopefully we will be able start him on the 01/13/23. Will call back if we can not get him started by then. If need to contact me at [phone]

## 2023-01-25 ENCOUNTER — Telehealth: Payer: Self-pay | Admitting: *Deleted

## 2023-01-25 NOTE — Telephone Encounter (Signed)
Baxter Hire, RN  calling  from [facility]  [phone] about pt needing PA for Ocrevus, he is due to 01-27-2023 infusion.     Pt has [employer]HC/Medicare per epic. Called UHC/Medicare, optum spec at [phone]. Spoke w/ Guy Begin.  Pt ID: [id number]. Auth approved 01/27/23 -01/27/2024 Auth# . [id number].   Jcode for Ocrevus: [id number] Started on therapy: 08/05/2016 Tried/failed: Edwin Dada   Infusion site: [facility]) [address] [place],  [place]  [address] Main: [phone] Fax: [phone] [id number] Tax ID: [id number]

## 2023-01-27 ENCOUNTER — Encounter (HOSPITAL_COMMUNITY)
Admission: RE | Admit: 2023-01-27 | Discharge: 2023-01-27 | Disposition: A | Discharge status: Home / Self Care | Payer: 59 | Source: Ambulatory Visit | Attending: Neurology | Admitting: Neurology

## 2023-01-27 DIAGNOSIS — G35 Multiple sclerosis: Secondary | ICD-10-CM | POA: Insufficient documentation

## 2023-01-27 MED ORDER — FAMOTIDINE IN NACL 20-0.9 MG/50ML-% IV SOLN: 20.0000 mg | Freq: Once | INTRAVENOUS | Status: AC

## 2023-01-27 MED ORDER — METHYLPREDNISOLONE SODIUM SUCC 125 MG IJ SOLR: 125.0000 mg | Freq: Once | INTRAMUSCULAR | Status: AC

## 2023-01-27 MED ORDER — ACETAMINOPHEN 325 MG PO TABS
650.0000 mg | ORAL_TABLET | Freq: Once | ORAL | Status: AC
  Administered 2023-01-27: 650 mg via ORAL

## 2023-01-27 MED ORDER — DIPHENHYDRAMINE HCL 50 MG/ML IJ SOLN
INTRAMUSCULAR | Status: AC
  Administered 2023-01-27: 50 mg via INTRAVENOUS
  Filled 2023-01-27: qty 1

## 2023-01-27 MED ORDER — ACETAMINOPHEN 325 MG PO TABS
ORAL_TABLET | ORAL | Status: AC
  Filled 2023-01-27: qty 2

## 2023-01-27 MED ORDER — SODIUM CHLORIDE 0.9 % IV SOLN: Freq: Once | INTRAVENOUS | Status: AC

## 2023-01-27 MED ORDER — FAMOTIDINE IN NACL 20-0.9 MG/50ML-% IV SOLN
INTRAVENOUS | Status: AC
  Administered 2023-01-27: 20 mg via INTRAVENOUS
  Filled 2023-01-27: qty 50

## 2023-01-27 MED ORDER — SODIUM CHLORIDE 0.9 % IV SOLN
600.0000 mg | INTRAVENOUS | Status: DC
  Administered 2023-01-27: 600 mg via INTRAVENOUS
  Filled 2023-01-27: qty 20

## 2023-01-27 MED ORDER — DIPHENHYDRAMINE HCL 50 MG/ML IJ SOLN: 50.0000 mg | Freq: Once | INTRAMUSCULAR | Status: AC

## 2023-01-27 MED ORDER — METHYLPREDNISOLONE SODIUM SUCC 125 MG IJ SOLR
INTRAMUSCULAR | Status: AC
  Administered 2023-01-27: 125 mg via INTRAVENOUS
  Filled 2023-01-27: qty 2

## 2023-01-27 NOTE — Telephone Encounter (Signed)
Called pt and let him know that his paperwork has been completed and he can pick them up tomorrow at the front desk. Pt verbalized understanding.

## 2023-01-27 NOTE — Telephone Encounter (Signed)
Pt has called asking for the status of the home health forms he dropped off last week for Dr Epimenio Foot to complete.  He was advised providers are allowed 14 bus days for forms.  Pt agreed to reach out to [place] from previous message from 01-08-23

## 2023-01-28 ENCOUNTER — Other Ambulatory Visit: Payer: Self-pay | Admitting: Neurology

## 2023-01-28 MED ORDER — AMPHETAMINE-DEXTROAMPHET ER 20 MG PO CP24: 20.0000 mg | ORAL_CAPSULE | Freq: Every day | ORAL | 0 refills | Status: DC

## 2023-01-28 NOTE — Telephone Encounter (Signed)
Last visit 12/16/22 Next visit 06/30/23   Per Dr Epimenio Foot at last visit, continue adderall.   Last two months dispense history in system states:   Rx refill sent to Amy NP since Dr Epimenio Foot is out of the office.

## 2023-01-28 NOTE — Telephone Encounter (Signed)
Pt in office requesting refill of amphetamine-dextroamphetamine (ADDERALL XR) 20 MG 24 hr capsule  at [facility] [phone]

## 2023-01-29 ENCOUNTER — Telehealth: Payer: Self-pay | Admitting: *Deleted

## 2023-01-29 NOTE — Telephone Encounter (Signed)
Lost handicap placard form.  Needed new one done.  Done and to medical records to call pt.

## 2023-02-08 ENCOUNTER — Encounter: Payer: Self-pay | Admitting: Neurology

## 2023-02-08 ENCOUNTER — Other Ambulatory Visit: Payer: Self-pay | Admitting: Neurology

## 2023-02-10 NOTE — Telephone Encounter (Signed)
Last seen on 12/16/22 Follow up scheduled on 06/30/23

## 2023-02-22 NOTE — Telephone Encounter (Signed)
Received notification in epic that pt had not read mychart message we sent. I LVM for pt to call office.

## 2023-03-04 ENCOUNTER — Telehealth: Payer: Self-pay

## 2023-03-04 NOTE — Telephone Encounter (Signed)
Faxed paperwork [facility] [phone] on 03/04/2023

## 2023-03-24 ENCOUNTER — Other Ambulatory Visit: Payer: Self-pay | Admitting: Neurology

## 2023-03-24 ENCOUNTER — Encounter: Payer: Self-pay | Admitting: Neurology

## 2023-03-24 MED ORDER — DIAZEPAM 5 MG PO TABS: 5.0000 mg | ORAL_TABLET | Freq: Every day | ORAL | 2 refills | Status: DC

## 2023-03-24 NOTE — Progress Notes (Signed)
Hey

## 2023-04-06 ENCOUNTER — Telehealth: Payer: Self-pay | Admitting: *Deleted

## 2023-04-06 NOTE — Telephone Encounter (Signed)
Gave completed/signed [id number]/Request for independent assessment for personal care services attestation of medical need form back to medical records to process for pt.

## 2023-04-07 ENCOUNTER — Telehealth: Payer: Self-pay

## 2023-04-07 NOTE — Telephone Encounter (Signed)
error 

## 2023-04-07 NOTE — Telephone Encounter (Deleted)
Paperwork signed on 04/07/2023, copy in medical records. Placed forms at front for pt to pick up.

## 2023-04-08 NOTE — Telephone Encounter (Signed)
Received fax asking for impact on ADL's to be added to form. This was added and re-faxed to [phone]. Received fax confirmation.

## 2023-04-21 ENCOUNTER — Other Ambulatory Visit: Payer: Self-pay | Admitting: Neurology

## 2023-04-21 ENCOUNTER — Other Ambulatory Visit: Payer: Self-pay

## 2023-04-21 ENCOUNTER — Telehealth: Payer: Self-pay

## 2023-04-21 NOTE — Telephone Encounter (Signed)
Pt last seen 12/16/2022 Upcoming Appointment 06/30/2023   Adderral Last filled 03/16/2023

## 2023-04-22 NOTE — Telephone Encounter (Signed)
Last seen on 12/16/22 Follow up scheduled 06/30/23

## 2023-04-28 DIAGNOSIS — M9904 Segmental and somatic dysfunction of sacral region: Secondary | ICD-10-CM | POA: Diagnosis not present

## 2023-04-28 DIAGNOSIS — M9903 Segmental and somatic dysfunction of lumbar region: Secondary | ICD-10-CM | POA: Diagnosis not present

## 2023-04-28 DIAGNOSIS — M9905 Segmental and somatic dysfunction of pelvic region: Secondary | ICD-10-CM | POA: Diagnosis not present

## 2023-04-28 DIAGNOSIS — M5386 Other specified dorsopathies, lumbar region: Secondary | ICD-10-CM | POA: Diagnosis not present

## 2023-05-05 DIAGNOSIS — M9905 Segmental and somatic dysfunction of pelvic region: Secondary | ICD-10-CM | POA: Diagnosis not present

## 2023-05-05 DIAGNOSIS — M9903 Segmental and somatic dysfunction of lumbar region: Secondary | ICD-10-CM | POA: Diagnosis not present

## 2023-05-05 DIAGNOSIS — M9904 Segmental and somatic dysfunction of sacral region: Secondary | ICD-10-CM | POA: Diagnosis not present

## 2023-05-05 DIAGNOSIS — M5386 Other specified dorsopathies, lumbar region: Secondary | ICD-10-CM | POA: Diagnosis not present

## 2023-06-28 NOTE — Progress Notes (Deleted)
 PATIENT: Mike Lowery DOB: Jul 13, 1985  REASON FOR VISIT: follow up HISTORY FROM: patient  No chief complaint on file.    HISTORY OF PRESENT ILLNESS:  06/28/23 ALL:  He returns for RRMS follow up. He continues to tolerate Ocrevus infusions, last in 12/2019. Next infusion scheduled for tomorrow with short stay. MRI 12/2019 stable. Labs have been stable.   He feels he is doing really good from a MS perspective.   HH?   Baclofen 20mg  TID continues to help with spasticity. Tizanidine?  He was started on valium for muscle spasms interrupting sleep 03/2023.  Gabapentin 300-600 TID  He continues Adderall that helps with attention and fatigue.   Mood and sleep stable.   No changes in vision, bowel or bladder habits.    History (copied from Dr Bonnita Hollow previous note)  [name],is a 38 y.o. man with relapsing remitting multiple sclerosis.     Update  12/16/2022: He is on Ocrevus and tolerates it well.   His last infusion was 12/2021    He has not had any exacerbation while on it.      IgM and IgA were reduced at last visit.  IgM was 14.  IgG was normal.  MRI of the brain 01/09/2022 showed no new lesions.   He reports more spasms in his legs > shoulders.  This is worse at night. Left side is worse.   Sometimes walking triggers the spasms.    He is on baclofen 20 mg tid.   He has not been on tizanidine.     His gait is slightly worse and he uses the cane daily.  Dalfampridine had not helped s was stopped.  The left side is weaker than right and has more spasticity.     He has pain,  numbness and tingling in the left leg.   Marland Kitchen   He has mild urinary hesitancy that is stable.    Vision is doing well.   He notes mild fatigue that fluctuates a lot.  Adderall helps some.    He avoids going outside in the heat.   Due to dysesthesias and spasms, he sometimes sleeps poorly- sleep mainenance worse than onset.    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   MS HISTORY:  In July/Aug 2015, he had the onset of weakness and numbness in the arms and legs, a little worse on the left.   When symptoms persisted, he presented to [facility] emergency room. MRI was consistent with multiple sclerosis and he was admitted. The MRI of the brain showed several plaques, mostly in the periventricular white matter. The MRI of the cervical spine showed several large T2 hyperintense foci, with some enhancement. He was given IV Solu-Medrol and his symptoms improved quite a bit. He followed up with Dr. Everlena Cooper and was placed on Plegridy about 8 or 9 months after starting Plegridy, he had another exacerbation and MRI of the brain and cervical spine were performed again 02/21/2015. The MRI of the cervical spine does not show any definite new lesions. However, the MRI of the brain does show several new lesions including for small enhancing foci. He was switched to [facility] November or December 2016.   I have personally reviewed the MRIs of the brain  and cervical spine from October 2016. The MRI of the brain shows multiple T2/FLAIR hyperintense foci consistent with MS in the hemispheres and brainstem.   4 of the hemispheric small foci enhanced after gadolinium administration. The MRI of the cervical spine shows multiple hyperintense foci. There was a normal enhancement pattern.   He had a large exacerbation while on Tecfidera with new gait difficulties March 2018.   MRI's show multiple new lesions in the brain with several lesions enhancing including a larger one in the left periventrivcular white matter.    Also has a new focus adjacent to C4 in the spinal cord.  He was switched to ocrelizumab and had his first infusion at the end of March 2018.      MRI images MRI of the brain 06/04/2020 showed T2/flair hyperintense foci in the brainstem, left cerebellar hemisphere,  thalamus and the hemispheres.  None of the foci enhanced.  No changes compared to the 12/28/2019 MRI.   MRI of the cervical spine 06/04/2020 and 07/16/2016 showed multiple T2 hyperintense foci within the spinal cord.  They are somewhat confluent and primarily located posteriorly adjacent to C2, posteriorly and more to the left adjacent to C3-C4, posteriorly more to the right adjacent to C4-C5, posteriorly adjacent to C5-C6, more to the left adjacent to C6-C7 and posteriorly adjacent to C7-T1, adjacent to T1.  No enhancing lesions.  No change between 2018 and 2022.   MRI of the thoracic spine 06/04/2020 showed multiple T2 hyperintense foci within the spinal cord, no comparison films.  Number the foci appear to be acute.  They do not enhance.   MRI of the brain 12/28/2019 showed multiple T2/FLAIR hyperintense foci in the brainstem, left cerebellar hemisphere, left thalamus and in the periventricular, juxtacortical and deep white matter both hemispheres in a pattern and configuration consistent with chronic demyelinating plaque associated with multiple sclerosis.  None of the foci appear to be acute and they do not enhance.  Compared to the MRI from 01/01/2018, there are no new lesions.  Normal enhancement pattern and no acute findings   MRI of the brain 01/10/2022 shows T2/FLAIR hyperintense foci in the brainstem, cerebellum, left thalamus and hemispheres in a pattern and configuration consistent with chronic demyelinating plaque associated with multiple sclerosis.  None of the foci appear to be acute.  They do not enhance.  Compared to the MRI from 06/04/2020, there are no new lesions.     REVIEW OF SYSTEMS: Out of a complete 14 system review of symptoms, the patient complains only of the following symptoms, left knee pain, fatigue and all other reviewed systems are negative.   ALLERGIES: No Known Allergies  HOME MEDICATIONS: Outpatient Medications Prior to Visit  Medication Sig Dispense Refill    acetaminophen (TYLENOL) 325 MG tablet Take 2 tablets (650 mg total) by mouth every 6 (six) hours as needed for fever. 20 tablet 0   amphetamine-dextroamphetamine (ADDERALL XR) 20 MG 24 hr capsule Take 1 capsule (20 mg total) by mouth daily. 30 capsule 0   baclofen (LIORESAL) 20 MG tablet TAKE 1 TABLET(20 MG) BY MOUTH THREE TIMES DAILY 90 tablet 2   diazepam (VALIUM) 5 MG tablet Take 1 tablet (5 mg total) by mouth at bedtime. 30 tablet 2   gabapentin (NEURONTIN) 600 MG tablet TAKE 1/2 TO 1 TABLET BY MOUTH THREE TIMES DAILY 90 tablet 2   loratadine (CLARITIN) 10 MG tablet Take 1 tablet (10 mg total) by mouth daily as needed for allergies. 30 tablet 0  ocrelizumab (OCREVUS) 300 MG/10ML injection HOLD WHILE ACTIVE INFECTION. PLEASE CHECK WITH YOUR NEUROLOGIST ON WHEN TO RESTART. (Patient taking differently: Inject 300 mg into the vein every 6 (six) months.) 20 mL 0   tiZANidine (ZANAFLEX) 4 MG tablet TAKE 1/2 TO 1 TABLET BY MOUTH THREE TIMES DAILY 30 tablet 2   No facility-administered medications prior to visit.    PAST MEDICAL HISTORY: Past Medical History:  Diagnosis Date   Depression    situaltional   Dyspnea    Multiple sclerosis (HCC) 12/24/13   Multiple sclerosis (HCC)    Vision abnormalities     PAST SURGICAL HISTORY: Past Surgical History:  Procedure Laterality Date   NO PAST SURGERIES      FAMILY HISTORY: Family History  Problem Relation Age of Onset   Healthy Mother    Healthy Father    Cancer Maternal Grandmother        unknown    Ataxia Sister    Multiple sclerosis Neg Hx     SOCIAL HISTORY: Social History   Socioeconomic History   Marital status: Single    Spouse name: Not on file   Number of children: Not on file   Years of education: Not on file   Highest education level: Not on file  Occupational History   Not on file  Tobacco Use   Smoking status: Former    Current packs/day: 0.00    Types: Cigarettes, Cigars    Quit date: 04/18/2013    Years since  quitting: 10.2   Smokeless tobacco: Former    Quit date: 09/08/2014  Vaping Use   Vaping status: Some Days  Substance and Sexual Activity   Alcohol use: Yes   Drug use: No    Types: Marijuana    Comment: former   Sexual activity: Yes    Partners: Female  Other Topics Concern   Not on file  Social History Narrative   Right Handed    Occasional Coffee Drinker   Occasional Soda Drinker   Social Drivers of Health   Financial Resource Strain: Not on file  Food Insecurity: No Food Insecurity (04/15/2022)   Hunger Vital Sign    Worried About Running Out of Food in the Last Year: Never true    Ran Out of Food in the Last Year: Never true  Transportation Needs: No Transportation Needs (04/15/2022)   PRAPARE - Administrator, Civil Service (Medical): No    Lack of Transportation (Non-Medical): No  Physical Activity: Not on file  Stress: Not on file  Social Connections: Not on file  Intimate Partner Violence: Not At Risk (04/15/2022)   Humiliation, Afraid, Rape, and Kick questionnaire    Fear of Current or Ex-Partner: No    Emotionally Abused: No    Physically Abused: No    Sexually Abused: No      PHYSICAL EXAM  There were no vitals filed for this visit.  There is no height or weight on file to calculate BMI.  Generalized: Well developed, in no acute distress  Cardiology: normal rate and rhythm, no murmur noted Respiratory: clear to auscultation bilaterally  Neurological examination  Mentation: Alert oriented to time, place, history taking. Follows all commands speech and language fluent Cranial nerve II-XII: Pupils were equal round reactive to light. Extraocular movements were full, visual field were full on confrontational test. Facial sensation and strength were normal. Head turning and shoulder shrug  were normal and symmetric. Motor: The motor testing reveals 5 over 5  strength of all 4 extremities. Good symmetric motor tone is noted throughout. ROM intact  but pain reported with full extension, No obvious edema noted, no pain with palpation of knee joint.  Sensory: Sensory testing is intact to soft touch on all 4 extremities. No evidence of extinction is noted.  Coordination: Cerebellar testing reveals good finger-nose-finger and heel-to-shin bilaterally.  Gait and station: Gait is stable without assistive device, slight left limp, tandem not attempted today due to knee pain    DIAGNOSTIC DATA (LABS, IMAGING, TESTING) - I reviewed patient records, labs, notes, testing and imaging myself where available.      No data to display           Lab Results  Component Value Date   WBC 3.9 12/31/2022   HGB 14.1 12/31/2022   HCT 41.3 12/31/2022   MCV 85 12/31/2022   PLT 325 12/31/2022      Component Value Date/Time   NA 138 04/21/2022 2320   NA 142 12/29/2021 1104   K 3.9 04/21/2022 2320   CL 104 04/21/2022 2320   CO2 24 04/21/2022 2320   GLUCOSE 91 04/21/2022 2320   BUN 7 04/21/2022 2320   BUN 9 12/29/2021 1104   CREATININE 0.78 04/21/2022 2320   CREATININE 0.92 03/27/2015 1130   CALCIUM 8.6 (L) 04/21/2022 2320   PROT 6.1 (L) 04/16/2022 0401   PROT 6.4 12/29/2021 1104   ALBUMIN 3.7 04/16/2022 0401   ALBUMIN 4.7 12/29/2021 1104   AST 27 04/16/2022 0401   ALT 39 04/16/2022 0401   ALKPHOS 53 04/16/2022 0401   BILITOT 0.4 04/16/2022 0401   BILITOT 0.6 12/29/2021 1104   GFRNONAA >60 04/21/2022 2320   GFRNONAA >89 10/10/2014 1251   GFRAA 103 06/22/2019 0850   GFRAA >89 10/10/2014 1251   No results found for: "CHOL", "HDL", "LDLCALC", "LDLDIRECT", "TRIG", "CHOLHDL" Lab Results  Component Value Date   HGBA1C 4.9 06/05/2020   Lab Results  Component Value Date   VITAMINB12 242 07/02/2021   No results found for: "TSH"     ASSESSMENT AND PLAN 38 y.o. year old male  has a past medical history of Depression, Dyspnea, Multiple sclerosis (HCC) (12/24/13), Multiple sclerosis (HCC), and Vision abnormalities. here with   No  diagnosis found.   Mr Prokop is doing very well on Ocrevus. No new or worsening symptoms. We will continue current treatment regimen. He will continue to monitor left knee pain. I have called in meloxicam 7.5mg  for antiinflammatory and pain relief. He will take daily with food for two weeks, then as needed. Referral placed to PCP. Labs from admission reviewed today. He will return in 6 months for follow up. He verbalizes understanding and agreement with this plan.    No orders of the defined types were placed in this encounter.    No orders of the defined types were placed in this encounter.    Shawnie Dapper, FNP-C 06/28/2023, 4:44 PM [facility] [address], [address] [place], [place] [address] [phone]

## 2023-06-30 ENCOUNTER — Encounter: Payer: Self-pay | Admitting: Family Medicine

## 2023-06-30 ENCOUNTER — Ambulatory Visit: Payer: 59 | Admitting: Family Medicine

## 2023-06-30 DIAGNOSIS — G35 Multiple sclerosis: Secondary | ICD-10-CM

## 2023-06-30 NOTE — Patient Instructions (Incomplete)

## 2023-06-30 NOTE — Progress Notes (Unsigned)
PATIENT: Mike Lowery DOB: 10-05-1985  REASON FOR VISIT: follow up HISTORY FROM: patient  No chief complaint on file.    HISTORY OF PRESENT ILLNESS:  06/30/23 ALL:  He returns for RRMS follow up. He continues to tolerate Ocrevus infusions, last in 12/2019. Next infusion scheduled for tomorrow with short stay. MRI 12/2019 stable. Labs have been stable.   He feels he is doing really good from a MS perspective.   HH?   Baclofen 20mg  TID continues to help with spasticity. Tizanidine?  He was started on valium for muscle spasms interrupting sleep 03/2023.  Gabapentin 300-600 TID  He continues Adderall that helps with attention and fatigue.   Mood and sleep stable.   No changes in vision, bowel or bladder habits.    History (copied from Dr Bonnita Hollow previous note)  Mike Lowery,is a 38 y.o. man with relapsing remitting multiple sclerosis.     Update  12/16/2022: He is on Ocrevus and tolerates it well.   His last infusion was 12/2021    He has not had any exacerbation while on it.      IgM and IgA were reduced at last visit.  IgM was 14.  IgG was normal.  MRI of the brain 01/09/2022 showed no new lesions.   He reports more spasms in his legs > shoulders.  This is worse at night. Left side is worse.   Sometimes walking triggers the spasms.    He is on baclofen 20 mg tid.   He has not been on tizanidine.     His gait is slightly worse and he uses the cane daily.  Dalfampridine had not helped s was stopped.  The left side is weaker than right and has more spasticity.     He has pain,  numbness and tingling in the left leg.   Marland Kitchen   He has mild urinary hesitancy that is stable.    Vision is doing well.   He notes mild fatigue that fluctuates a lot.  Adderall helps some.    He avoids going outside in the heat.   Due to dysesthesias and spasms, he sometimes sleeps poorly- sleep mainenance worse than onset.    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   MS HISTORY:  In July/Aug 2015, he had the onset of weakness and numbness in the arms and legs, a little worse on the left.   When symptoms persisted, he presented to [facility] emergency room. MRI was consistent with multiple sclerosis and he was admitted. The MRI of the brain showed several plaques, mostly in the periventricular white matter. The MRI of the cervical spine showed several large T2 hyperintense foci, with some enhancement. He was given IV Solu-Medrol and his symptoms improved quite a bit. He followed up with Dr. Everlena Cooper and was placed on Plegridy about 8 or 9 months after starting Plegridy, he had another exacerbation and MRI of the brain and cervical spine were performed again 02/21/2015. The MRI of the cervical spine does not show any definite new lesions. However, the MRI of the brain does show several new lesions including for small enhancing foci. He was switched to [facility] November or December 2016.   I have personally reviewed the MRIs of the brain  and cervical spine from October 2016. The MRI of the brain shows multiple T2/FLAIR hyperintense foci consistent with MS in the hemispheres and brainstem.   4 of the hemispheric small foci enhanced after gadolinium administration. The MRI of the cervical spine shows multiple hyperintense foci. There was a normal enhancement pattern.   He had a large exacerbation while on Tecfidera with new gait difficulties March 2018.   MRI's show multiple new lesions in the brain with several lesions enhancing including a larger one in the left periventrivcular white matter.    Also has a new focus adjacent to C4 in the spinal cord.  He was switched to ocrelizumab and had his first infusion at the end of March 2018.      MRI images MRI of the brain 06/04/2020 showed T2/flair hyperintense foci in the brainstem, left cerebellar hemisphere,  thalamus and the hemispheres.  None of the foci enhanced.  No changes compared to the 12/28/2019 MRI.   MRI of the cervical spine 06/04/2020 and 07/16/2016 showed multiple T2 hyperintense foci within the spinal cord.  They are somewhat confluent and primarily located posteriorly adjacent to C2, posteriorly and more to the left adjacent to C3-C4, posteriorly more to the right adjacent to C4-C5, posteriorly adjacent to C5-C6, more to the left adjacent to C6-C7 and posteriorly adjacent to C7-T1, adjacent to T1.  No enhancing lesions.  No change between 2018 and 2022.   MRI of the thoracic spine 06/04/2020 showed multiple T2 hyperintense foci within the spinal cord, no comparison films.  Number the foci appear to be acute.  They do not enhance.   MRI of the brain 12/28/2019 showed multiple T2/FLAIR hyperintense foci in the brainstem, left cerebellar hemisphere, left thalamus and in the periventricular, juxtacortical and deep white matter both hemispheres in a pattern and configuration consistent with chronic demyelinating plaque associated with multiple sclerosis.  None of the foci appear to be acute and they do not enhance.  Compared to the MRI from 01/01/2018, there are no new lesions.  Normal enhancement pattern and no acute findings   MRI of the brain 01/10/2022 shows T2/FLAIR hyperintense foci in the brainstem, cerebellum, left thalamus and hemispheres in a pattern and configuration consistent with chronic demyelinating plaque associated with multiple sclerosis.  None of the foci appear to be acute.  They do not enhance.  Compared to the MRI from 06/04/2020, there are no new lesions.     REVIEW OF SYSTEMS: Out of a complete 14 system review of symptoms, the patient complains only of the following symptoms, left knee pain, fatigue and all other reviewed systems are negative.   ALLERGIES: No Known Allergies  HOME MEDICATIONS: Outpatient Medications Prior to Visit  Medication Sig Dispense Refill    acetaminophen (TYLENOL) 325 MG tablet Take 2 tablets (650 mg total) by mouth every 6 (six) hours as needed for fever. 20 tablet 0   amphetamine-dextroamphetamine (ADDERALL XR) 20 MG 24 hr capsule Take 1 capsule (20 mg total) by mouth daily. 30 capsule 0   baclofen (LIORESAL) 20 MG tablet TAKE 1 TABLET(20 MG) BY MOUTH THREE TIMES DAILY 90 tablet 2   diazepam (VALIUM) 5 MG tablet Take 1 tablet (5 mg total) by mouth at bedtime. 30 tablet 2   gabapentin (NEURONTIN) 600 MG tablet TAKE 1/2 TO 1 TABLET BY MOUTH THREE TIMES DAILY 90 tablet 2   loratadine (CLARITIN) 10 MG tablet Take 1 tablet (10 mg total) by mouth daily as needed for allergies. 30 tablet 0  ocrelizumab (OCREVUS) 300 MG/10ML injection HOLD WHILE ACTIVE INFECTION. PLEASE CHECK WITH YOUR NEUROLOGIST ON WHEN TO RESTART. (Patient taking differently: Inject 300 mg into the vein every 6 (six) months.) 20 mL 0   tiZANidine (ZANAFLEX) 4 MG tablet TAKE 1/2 TO 1 TABLET BY MOUTH THREE TIMES DAILY 30 tablet 2   No facility-administered medications prior to visit.    PAST MEDICAL HISTORY: Past Medical History:  Diagnosis Date   Depression    situaltional   Dyspnea    Multiple sclerosis (HCC) 12/24/13   Multiple sclerosis (HCC)    Vision abnormalities     PAST SURGICAL HISTORY: Past Surgical History:  Procedure Laterality Date   NO PAST SURGERIES      FAMILY HISTORY: Family History  Problem Relation Age of Onset   Healthy Mother    Healthy Father    Cancer Maternal Grandmother        unknown    Ataxia Sister    Multiple sclerosis Neg Hx     SOCIAL HISTORY: Social History   Socioeconomic History   Marital status: Single    Spouse name: Not on file   Number of children: Not on file   Years of education: Not on file   Highest education level: Not on file  Occupational History   Not on file  Tobacco Use   Smoking status: Former    Current packs/day: 0.00    Types: Cigarettes, Cigars    Quit date: 04/18/2013    Years since  quitting: 10.2   Smokeless tobacco: Former    Quit date: 09/08/2014  Vaping Use   Vaping status: Some Days  Substance and Sexual Activity   Alcohol use: Yes   Drug use: No    Types: Marijuana    Comment: former   Sexual activity: Yes    Partners: Female  Other Topics Concern   Not on file  Social History Narrative   Right Handed    Occasional Coffee Drinker   Occasional Soda Drinker   Social Drivers of Health   Financial Resource Strain: Not on file  Food Insecurity: No Food Insecurity (04/15/2022)   Hunger Vital Sign    Worried About Running Out of Food in the Last Year: Never true    Ran Out of Food in the Last Year: Never true  Transportation Needs: No Transportation Needs (04/15/2022)   PRAPARE - Administrator, Civil Service (Medical): No    Lack of Transportation (Non-Medical): No  Physical Activity: Not on file  Stress: Not on file  Social Connections: Not on file  Intimate Partner Violence: Not At Risk (04/15/2022)   Humiliation, Afraid, Rape, and Kick questionnaire    Fear of Current or Ex-Partner: No    Emotionally Abused: No    Physically Abused: No    Sexually Abused: No      PHYSICAL EXAM  There were no vitals filed for this visit.  There is no height or weight on file to calculate BMI.  Generalized: Well developed, in no acute distress  Cardiology: normal rate and rhythm, no murmur noted Respiratory: clear to auscultation bilaterally  Neurological examination  Mentation: Alert oriented to time, place, history taking. Follows all commands speech and language fluent Cranial nerve II-XII: Pupils were equal round reactive to light. Extraocular movements were full, visual field were full on confrontational test. Facial sensation and strength were normal. Head turning and shoulder shrug  were normal and symmetric. Motor: The motor testing reveals 5 over 5  strength of all 4 extremities. Good symmetric motor tone is noted throughout. ROM intact  but pain reported with full extension, No obvious edema noted, no pain with palpation of knee joint.  Sensory: Sensory testing is intact to soft touch on all 4 extremities. No evidence of extinction is noted.  Coordination: Cerebellar testing reveals good finger-nose-finger and heel-to-shin bilaterally.  Gait and station: Gait is stable without assistive device, slight left limp, tandem not attempted today due to knee pain    DIAGNOSTIC DATA (LABS, IMAGING, TESTING) - I reviewed patient records, labs, notes, testing and imaging myself where available.      No data to display           Lab Results  Component Value Date   WBC 3.9 12/31/2022   HGB 14.1 12/31/2022   HCT 41.3 12/31/2022   MCV 85 12/31/2022   PLT 325 12/31/2022      Component Value Date/Time   NA 138 04/21/2022 2320   NA 142 12/29/2021 1104   K 3.9 04/21/2022 2320   CL 104 04/21/2022 2320   CO2 24 04/21/2022 2320   GLUCOSE 91 04/21/2022 2320   BUN 7 04/21/2022 2320   BUN 9 12/29/2021 1104   CREATININE 0.78 04/21/2022 2320   CREATININE 0.92 03/27/2015 1130   CALCIUM 8.6 (L) 04/21/2022 2320   PROT 6.1 (L) 04/16/2022 0401   PROT 6.4 12/29/2021 1104   ALBUMIN 3.7 04/16/2022 0401   ALBUMIN 4.7 12/29/2021 1104   AST 27 04/16/2022 0401   ALT 39 04/16/2022 0401   ALKPHOS 53 04/16/2022 0401   BILITOT 0.4 04/16/2022 0401   BILITOT 0.6 12/29/2021 1104   GFRNONAA >60 04/21/2022 2320   GFRNONAA >89 10/10/2014 1251   GFRAA 103 06/22/2019 0850   GFRAA >89 10/10/2014 1251   No results found for: "CHOL", "HDL", "LDLCALC", "LDLDIRECT", "TRIG", "CHOLHDL" Lab Results  Component Value Date   HGBA1C 4.9 06/05/2020   Lab Results  Component Value Date   VITAMINB12 242 07/02/2021   No results found for: "TSH"     ASSESSMENT AND PLAN 38 y.o. year old male  has a past medical history of Depression, Dyspnea, Multiple sclerosis (HCC) (12/24/13), Multiple sclerosis (HCC), and Vision abnormalities. here with   No  diagnosis found.   Mr Barthold is doing very well on Ocrevus. No new or worsening symptoms. We will continue current treatment regimen. He will continue to monitor left knee pain. I have called in meloxicam 7.5mg  for antiinflammatory and pain relief. He will take daily with food for two weeks, then as needed. Referral placed to PCP. Labs from admission reviewed today. He will return in 6 months for follow up. He verbalizes understanding and agreement with this plan.    No orders of the defined types were placed in this encounter.    No orders of the defined types were placed in this encounter.    Shawnie Dapper, FNP-C 06/30/2023, 4:35 PM [facility] [address] [place], [place] [address] [phone]

## 2023-07-01 ENCOUNTER — Encounter: Payer: Self-pay | Admitting: Family Medicine

## 2023-07-01 ENCOUNTER — Ambulatory Visit (INDEPENDENT_AMBULATORY_CARE_PROVIDER_SITE_OTHER): Payer: 59 | Admitting: Family Medicine

## 2023-07-01 ENCOUNTER — Telehealth: Payer: Self-pay | Admitting: Family Medicine

## 2023-07-01 VITALS — BP 114/68 | HR 67 | Ht 66.0 in | Wt 146.0 lb

## 2023-07-01 DIAGNOSIS — Z758 Other problems related to medical facilities and other health care: Secondary | ICD-10-CM

## 2023-07-01 DIAGNOSIS — Z79899 Other long term (current) drug therapy: Secondary | ICD-10-CM | POA: Diagnosis not present

## 2023-07-01 DIAGNOSIS — G35 Multiple sclerosis: Secondary | ICD-10-CM | POA: Diagnosis not present

## 2023-07-01 DIAGNOSIS — G4700 Insomnia, unspecified: Secondary | ICD-10-CM

## 2023-07-01 DIAGNOSIS — R252 Cramp and spasm: Secondary | ICD-10-CM | POA: Diagnosis not present

## 2023-07-01 DIAGNOSIS — R3911 Hesitancy of micturition: Secondary | ICD-10-CM | POA: Diagnosis not present

## 2023-07-01 DIAGNOSIS — R269 Unspecified abnormalities of gait and mobility: Secondary | ICD-10-CM

## 2023-07-01 DIAGNOSIS — R5383 Other fatigue: Secondary | ICD-10-CM | POA: Diagnosis not present

## 2023-07-01 MED ORDER — DIAZEPAM 5 MG PO TABS: 5.0000 mg | ORAL_TABLET | Freq: Every day | ORAL | 1 refills | Status: AC

## 2023-07-01 MED ORDER — GABAPENTIN 600 MG PO TABS: ORAL_TABLET | ORAL | 1 refills | Status: DC

## 2023-07-01 MED ORDER — BACLOFEN 20 MG PO TABS: 20.0000 mg | ORAL_TABLET | Freq: Three times a day (TID) | ORAL | 1 refills | Status: DC

## 2023-07-01 MED ORDER — AMPHETAMINE-DEXTROAMPHET ER 20 MG PO CP24: 20.0000 mg | ORAL_CAPSULE | Freq: Every day | ORAL | 0 refills | Status: DC

## 2023-07-01 NOTE — Telephone Encounter (Signed)
Referral for family practice fax to [facility]. Phone: [phone], Fax: [phone]

## 2023-07-01 NOTE — Telephone Encounter (Signed)
[employer] medicare/[place] medicaid NPR sent to [facility] [phone]

## 2023-07-02 ENCOUNTER — Encounter: Payer: Self-pay | Admitting: Family Medicine

## 2023-07-02 LAB — IGG, IGA, IGM
IgA/Immunoglobulin A, Serum: 71 mg/dL — ABNORMAL LOW (ref 90–386)
IgG (Immunoglobin G), Serum: 730 mg/dL (ref 603–1613)
IgM (Immunoglobulin M), Srm: 15 mg/dL — ABNORMAL LOW (ref 20–172)

## 2023-07-02 LAB — CBC WITH DIFFERENTIAL/PLATELET
Basophils Absolute: 0 10*3/uL (ref 0.0–0.2)
Basos: 1 %
EOS (ABSOLUTE): 0.2 10*3/uL (ref 0.0–0.4)
Eos: 4 %
Hematocrit: 41.2 % (ref 37.5–51.0)
Hemoglobin: 13.5 g/dL (ref 13.0–17.7)
Immature Grans (Abs): 0 10*3/uL (ref 0.0–0.1)
Immature Granulocytes: 0 %
Lymphocytes Absolute: 1.8 10*3/uL (ref 0.7–3.1)
Lymphs: 39 %
MCH: 29.1 pg (ref 26.6–33.0)
MCHC: 32.8 g/dL (ref 31.5–35.7)
MCV: 89 fL (ref 79–97)
Monocytes Absolute: 0.4 10*3/uL (ref 0.1–0.9)
Monocytes: 8 %
Neutrophils Absolute: 2.2 10*3/uL (ref 1.4–7.0)
Neutrophils: 48 %
Platelets: 332 10*3/uL (ref 150–450)
RBC: 4.64 x10E6/uL (ref 4.14–5.80)
RDW: 12.2 % (ref 11.6–15.4)
WBC: 4.6 10*3/uL (ref 3.4–10.8)

## 2023-07-26 ENCOUNTER — Other Ambulatory Visit: Payer: Self-pay | Admitting: *Deleted

## 2023-07-26 DIAGNOSIS — G35 Multiple sclerosis: Secondary | ICD-10-CM

## 2023-07-27 ENCOUNTER — Ambulatory Visit (HOSPITAL_COMMUNITY)
Admission: RE | Admit: 2023-07-27 | Discharge: 2023-07-27 | Disposition: A | Discharge status: Home / Self Care | Payer: 59 | Source: Ambulatory Visit | Attending: Neurology | Admitting: Neurology

## 2023-07-27 DIAGNOSIS — G35 Multiple sclerosis: Secondary | ICD-10-CM | POA: Diagnosis not present

## 2023-07-27 MED ORDER — DIPHENHYDRAMINE HCL 50 MG/ML IJ SOLN
INTRAMUSCULAR | Status: AC
  Administered 2023-07-27: 50 mg via INTRAVENOUS
  Filled 2023-07-27: qty 1

## 2023-07-27 MED ORDER — FAMOTIDINE IN NACL 20-0.9 MG/50ML-% IV SOLN: 20.0000 mg | Freq: Once | INTRAVENOUS | Status: AC

## 2023-07-27 MED ORDER — METHYLPREDNISOLONE SODIUM SUCC 125 MG IJ SOLR: 125.0000 mg | Freq: Once | INTRAMUSCULAR | Status: AC

## 2023-07-27 MED ORDER — SODIUM CHLORIDE 0.9 % IV SOLN: Freq: Once | INTRAVENOUS | Status: AC

## 2023-07-27 MED ORDER — DIPHENHYDRAMINE HCL 50 MG/ML IJ SOLN: 50.0000 mg | Freq: Once | INTRAMUSCULAR | Status: AC

## 2023-07-27 MED ORDER — ACETAMINOPHEN 325 MG PO TABS
ORAL_TABLET | ORAL | Status: AC
  Administered 2023-07-27: 650 mg via ORAL
  Filled 2023-07-27: qty 2

## 2023-07-27 MED ORDER — METHYLPREDNISOLONE SODIUM SUCC 125 MG IJ SOLR
INTRAMUSCULAR | Status: AC
  Administered 2023-07-27: 125 mg via INTRAVENOUS
  Filled 2023-07-27: qty 2

## 2023-07-27 MED ORDER — ACETAMINOPHEN 325 MG PO TABS: 650.0000 mg | ORAL_TABLET | Freq: Once | ORAL | Status: AC

## 2023-07-27 MED ORDER — FAMOTIDINE IN NACL 20-0.9 MG/50ML-% IV SOLN
INTRAVENOUS | Status: AC
  Administered 2023-07-27: 20 mg via INTRAVENOUS
  Filled 2023-07-27: qty 50

## 2023-07-27 MED ORDER — SODIUM CHLORIDE 0.9 % IV SOLN
600.0000 mg | Freq: Once | INTRAVENOUS | Status: AC
  Administered 2023-07-27: 600 mg via INTRAVENOUS
  Filled 2023-07-27: qty 20

## 2023-08-06 ENCOUNTER — Other Ambulatory Visit: Payer: Self-pay | Admitting: Family Medicine

## 2023-08-06 MED ORDER — AMPHETAMINE-DEXTROAMPHET ER 20 MG PO CP24: 20.0000 mg | ORAL_CAPSULE | Freq: Every day | ORAL | 0 refills | Status: DC

## 2023-08-06 NOTE — Telephone Encounter (Signed)
 Pt is requesting a refill for amphetamine-dextroamphetamine (ADDERALL XR) 20 MG 24 hr capsule .  Pharmacy: (ADDERALL XR) 20 MG 24 hr capsule

## 2023-08-06 NOTE — Telephone Encounter (Signed)
 Pt last seen 07/01/23 and next f/u 01/24/24. Last refilled 07/02/23 #30.

## 2023-08-14 ENCOUNTER — Other Ambulatory Visit: Payer: 59

## 2023-09-08 ENCOUNTER — Other Ambulatory Visit: Payer: Self-pay | Admitting: Family Medicine

## 2023-09-08 MED ORDER — AMPHETAMINE-DEXTROAMPHET ER 20 MG PO CP24: 20.0000 mg | ORAL_CAPSULE | Freq: Every day | ORAL | 0 refills | Status: DC

## 2023-09-08 NOTE — Telephone Encounter (Signed)
 Last seen 07/01/23 and next f/u 01/24/24. Last refilled 08/06/23 #30.

## 2023-09-08 NOTE — Telephone Encounter (Signed)
 Pt is needing a refill on his amphetamine -dextroamphetamine  (ADDERALL  XR) 20 MG 24 hr capsule and sent to the [facility] on [address]

## 2023-09-14 ENCOUNTER — Telehealth: Payer: Self-pay | Admitting: Family Medicine

## 2023-09-14 ENCOUNTER — Encounter: Payer: Self-pay | Admitting: *Deleted

## 2023-09-14 DIAGNOSIS — R252 Cramp and spasm: Secondary | ICD-10-CM

## 2023-09-14 DIAGNOSIS — R269 Unspecified abnormalities of gait and mobility: Secondary | ICD-10-CM

## 2023-09-14 DIAGNOSIS — R5383 Other fatigue: Secondary | ICD-10-CM

## 2023-09-14 DIAGNOSIS — G35 Multiple sclerosis: Secondary | ICD-10-CM

## 2023-09-14 NOTE — Telephone Encounter (Signed)
Letter printed, waiting on MD signature °

## 2023-09-14 NOTE — Telephone Encounter (Signed)
 Called pt. Relayed letter ready for pick up and placed up front. He verbalized understanding.

## 2023-09-14 NOTE — Telephone Encounter (Signed)
 Called pt. Trying to get approved for nurse aid. Requesting letter showing medical need for this. Aware I will speak with Dr. Godwin Lat. If ok, we will write and call once ready for pick up. He verbalized understanding.

## 2023-09-14 NOTE — Telephone Encounter (Signed)
 At 10:34 pt left a vm asking to be called, his vm did not state the reason for his call. Phone rep left a message asking pt return call so that we can try and assist.

## 2023-09-16 NOTE — Telephone Encounter (Signed)
 Pt has called with his insurance company to inquire about a PA for Home health aid, the response from RN being :"we do not do PA's for home health aid, typically company who home health aid through is who works on that   they need to contact the company"   pt states this conflicts with the letter he came to the office and picked up on yesterday, he is asking for a call to understand better what his options are.

## 2023-09-16 NOTE — Telephone Encounter (Signed)
 Called pt back. After speaking further he needs new referral to home health below for nurse aid. Aware we will place and they will work on approval through insurance from there. I placed referral.  [facility] [address] [place] [address] Phone: [phone], Fax: [phone]

## 2023-09-16 NOTE — Telephone Encounter (Signed)
 sent

## 2023-09-16 NOTE — Addendum Note (Signed)
 Addended by: Oneita Bihari on: 09/16/2023 10:58 AM   Modules accepted: Orders

## 2023-09-24 ENCOUNTER — Other Ambulatory Visit

## 2023-10-07 ENCOUNTER — Other Ambulatory Visit: Payer: Self-pay | Admitting: Family Medicine

## 2023-10-07 MED ORDER — AMPHETAMINE-DEXTROAMPHET ER 20 MG PO CP24: 20.0000 mg | ORAL_CAPSULE | Freq: Every day | ORAL | 0 refills | Status: DC

## 2023-10-07 NOTE — Telephone Encounter (Signed)
 Pt request refill for amphetamine -dextroamphetamine  (ADDERALL  XR) 20 MG 24 hr capsule send to  [facility] [phone]

## 2023-10-07 NOTE — Telephone Encounter (Signed)
 Dr, Omar Bibber you are work in provider this pm  Last seen on 07/01/23 Follow up scheduled on 01/24/24   Dispensed Days Supply Quantity Provider Pharmacy  D-AMPHETAMINE  ER 20MG  SALT COMBO CP 09/08/2023 30 30 each Lomax, Amy, NP [facility]...     Rx pending to be signed

## 2023-11-02 ENCOUNTER — Other Ambulatory Visit

## 2023-11-08 ENCOUNTER — Other Ambulatory Visit: Payer: Self-pay | Admitting: Family Medicine

## 2023-11-08 MED ORDER — AMPHETAMINE-DEXTROAMPHET ER 20 MG PO CP24: 20.0000 mg | ORAL_CAPSULE | Freq: Every day | ORAL | 0 refills | Status: DC

## 2023-11-08 NOTE — Telephone Encounter (Signed)
 Patient request refill for amphetamine -dextroamphetamine  (ADDERALL  XR) 20 MG 24 hr capsule send to [facility] [phone]

## 2023-11-08 NOTE — Telephone Encounter (Signed)
 Last seen 07/01/23 and next f/u 01/24/24. Last refilled 10/08/23 #30.

## 2023-11-28 ENCOUNTER — Ambulatory Visit
Admission: RE | Admit: 2023-11-28 | Discharge: 2023-11-28 | Disposition: A | Discharge status: Home / Self Care | Source: Ambulatory Visit | Attending: Family Medicine | Admitting: Family Medicine

## 2023-11-28 DIAGNOSIS — G35 Multiple sclerosis: Secondary | ICD-10-CM | POA: Diagnosis not present

## 2023-11-28 MED ORDER — GADOPICLENOL 0.5 MMOL/ML IV SOLN
7.0000 mL | Freq: Once | INTRAVENOUS | Status: AC | PRN
  Administered 2023-11-28: 7 mL via INTRAVENOUS

## 2023-11-29 ENCOUNTER — Ambulatory Visit: Payer: Self-pay | Admitting: Family Medicine

## 2023-11-30 ENCOUNTER — Telehealth (HOSPITAL_COMMUNITY): Payer: Self-pay | Admitting: Pharmacy Technician

## 2023-11-30 NOTE — Telephone Encounter (Signed)
 Auth Submission: APPROVED Site of care: Site of care: [facility] Payer: UHC Medicare, [place] Medicaid Medication & CPT/J Code(s) submitted: Ocrevus  Antoniette) [id number] Diagnosis Code: G35 Route of submission (phone, fax, portal): portal Phone # Fax # Auth type: Buy/Bill HB Units/visits requested: 600mg  x 2 doses, q 6 months Reference number: [id number] Approval from: 11/30/23 to 11/29/24  Patient has [place] medicaid secondary that will pick up any remaining balance from [facility].   Joleene Burnham, CPhT [facility] [phone]

## 2023-12-06 ENCOUNTER — Other Ambulatory Visit: Payer: Self-pay | Admitting: Family Medicine

## 2023-12-06 MED ORDER — AMPHETAMINE-DEXTROAMPHET ER 20 MG PO CP24: 20.0000 mg | ORAL_CAPSULE | Freq: Every day | ORAL | 0 refills | Status: DC

## 2023-12-06 NOTE — Telephone Encounter (Signed)
 Pt called needing a refill on his amphetamine -dextroamphetamine  (ADDERALL  XR) 20 MG 24 hr capsule and needing it sent to the [facility] on [address].

## 2023-12-06 NOTE — Telephone Encounter (Signed)
 Last seen 07/01/23 and next f/u 01/24/24. Last refilled 11/08/23 #30.

## 2023-12-26 ENCOUNTER — Encounter (HOSPITAL_COMMUNITY): Payer: Self-pay

## 2023-12-26 ENCOUNTER — Ambulatory Visit (HOSPITAL_COMMUNITY)
Admission: EM | Admit: 2023-12-26 | Discharge: 2023-12-26 | Disposition: A | Discharge status: Home / Self Care | Attending: Physician Assistant | Admitting: Physician Assistant

## 2023-12-26 ENCOUNTER — Ambulatory Visit (INDEPENDENT_AMBULATORY_CARE_PROVIDER_SITE_OTHER)

## 2023-12-26 DIAGNOSIS — R052 Subacute cough: Secondary | ICD-10-CM | POA: Diagnosis not present

## 2023-12-26 DIAGNOSIS — J40 Bronchitis, not specified as acute or chronic: Secondary | ICD-10-CM

## 2023-12-26 DIAGNOSIS — J329 Chronic sinusitis, unspecified: Secondary | ICD-10-CM | POA: Diagnosis not present

## 2023-12-26 MED ORDER — AMOXICILLIN-POT CLAVULANATE 875-125 MG PO TABS: 1.0000 | ORAL_TABLET | Freq: Two times a day (BID) | ORAL | 0 refills | Status: DC

## 2023-12-26 MED ORDER — ALBUTEROL SULFATE HFA 108 (90 BASE) MCG/ACT IN AERS
2.0000 | INHALATION_SPRAY | Freq: Once | RESPIRATORY_TRACT | Status: AC
  Administered 2023-12-26: 2 via RESPIRATORY_TRACT

## 2023-12-26 MED ORDER — ALBUTEROL SULFATE HFA 108 (90 BASE) MCG/ACT IN AERS
INHALATION_SPRAY | RESPIRATORY_TRACT | Status: AC
  Filled 2023-12-26: qty 6.7

## 2023-12-26 NOTE — ED Triage Notes (Signed)
 Pt states that he has some nasal congestion, sob and headache. X1 month

## 2023-12-26 NOTE — Discharge Instructions (Signed)
 Your x-ray was normal with no evidence of pneumonia.  I believe that you have a sinus infection that is draining and causing your symptoms.  Start Augmentin  twice daily for 7 days.  Use the albuterol  inhaler every 4-6 hours as needed.  I also recommend nasal saline sinus rinses as well as Mucinex  and Flonase  to help your symptoms.  If you are not feeling better in a week please return for reevaluation.  If anything worsens and you have high fever, worsening cough, shortness of breath, chest pain you need to be seen immediately.

## 2023-12-26 NOTE — ED Provider Notes (Signed)
 [facility]    CSN: [id number] Arrival date & time: 12/26/23  1413      History   Chief Complaint Chief Complaint  Patient presents with   Nasal Congestion    Nasal congestion, sob and headache    HPI Mike Lowery is a 38 y.o. male.   Patient presents today with a 1 month history of nasal congestion.  He does report occasional shortness of breath as well as sinus pressure.  He is experiencing some cough but this is intermittent.  He is a smoker but denies any history of asthma or COPD.  He has been taking ibuprofen  as well as cold and flu medication without improvement of symptoms.  Denies any recent antibiotics or steroids.  He denies any known sick contacts.  He does have a history of MS and is on immune modulating medication (ocrelizumab ) but denies additional immunosuppression.  He is having difficulty sleeping because of his symptoms.    Past Medical History:  Diagnosis Date   Depression    situaltional   Dyspnea    Multiple sclerosis (HCC) 12/24/13   Multiple sclerosis (HCC)    Vision abnormalities     Patient Active Problem List   Diagnosis Date Noted   Leukopenia 04/17/2022   Hyperphosphatemia 04/17/2022   Influenza B 04/14/2022   Acute left-sided weakness 04/14/2022   Left hemiparesis (HCC) 04/14/2022   Substance-induced anxiety disorder (HCC) 02/22/2022   Marijuana use 02/22/2022   MDD (major depressive disorder), recurrent episode, severe (HCC) 02/21/2022   Overdose of undetermined intent 02/21/2022   Urinary hesitancy 12/29/2021   Acute COVID-19 06/30/2021   COVID-19 virus infection 06/04/2020   High risk sexual behavior 12/22/2017   Immunosuppression (HCC) 12/22/2017   Screening for human immunodeficiency virus 12/22/2017   High risk medication use 12/21/2016   Hepatitis B antibody positive 07/19/2016   Spasticity 06/17/2016   Spells 07/22/2015   Depression with anxiety 07/02/2015   Other fatigue 05/01/2015   Numbness 05/01/2015    Gait disturbance 05/01/2015   Disturbed cognition 05/01/2015   Erectile dysfunction 05/01/2015   Multiple sclerosis exacerbation (HCC) 03/11/2015   Tobacco abuse 03/06/2015   Nausea without vomiting 11/08/2014   Depression due to multiple sclerosis (HCC) 11/08/2014   Relapsing remitting multiple sclerosis (HCC) 10/01/2014   Swelling of both hands 05/14/2014   Multiple sclerosis (HCC) 12/24/2013    Past Surgical History:  Procedure Laterality Date   NO PAST SURGERIES         Home Medications    Prior to Admission medications   Medication Sig Start Date End Date Taking? Authorizing Provider  acetaminophen  (TYLENOL ) 325 MG tablet Take 2 tablets (650 mg total) by mouth every 6 (six) hours as needed for fever. 04/16/22  Yes Sheikh, Omair Latif, DO  amoxicillin -clavulanate (AUGMENTIN ) 875-125 MG tablet Take 1 tablet by mouth every 12 (twelve) hours. 12/26/23  Yes Kace Hartje K, PA-C  amphetamine -dextroamphetamine  (ADDERALL  XR) 20 MG 24 hr capsule Take 1 capsule (20 mg total) by mouth daily. 12/06/23  Yes Sater, Charlie LABOR, MD  baclofen  (LIORESAL ) 20 MG tablet Take 1 tablet (20 mg total) by mouth 3 (three) times daily. 07/01/23  Yes Lomax, Amy, NP  diazepam  (VALIUM ) 5 MG tablet Take 1 tablet (5 mg total) by mouth at bedtime. 07/01/23  Yes Lomax, Amy, NP  gabapentin  (NEURONTIN ) 600 MG tablet TAKE 1/2 TO 1 TABLET BY MOUTH THREE TIMES DAILY 07/01/23  Yes Lomax, Amy, NP  loratadine  (CLARITIN ) 10 MG tablet Take 1  tablet (10 mg total) by mouth daily as needed for allergies. 04/16/22  Yes Sheikh, Omair Latif, DO  ocrelizumab  (OCREVUS ) 300 MG/10ML injection HOLD WHILE ACTIVE INFECTION. PLEASE CHECK WITH YOUR NEUROLOGIST ON WHEN TO RESTART. Patient taking differently: Inject 300 mg into the vein every 6 (six) months. 07/02/21  Yes Briana Elgin LABOR, MD    Family History Family History  Problem Relation Age of Onset   Healthy Mother    Healthy Father    Cancer Maternal Grandmother        unknown     Ataxia Sister    Multiple sclerosis Neg Hx     Social History Social History   Tobacco Use   Smoking status: Former    Current packs/day: 0.00    Types: Cigarettes, Cigars    Quit date: 04/18/2013    Years since quitting: 10.6   Smokeless tobacco: Former    Quit date: 09/08/2014  Vaping Use   Vaping status: Some Days  Substance Use Topics   Alcohol use: Yes   Drug use: No    Types: Marijuana    Comment: former     Allergies   Patient has no known allergies.   Review of Systems Review of Systems  Constitutional:  Positive for activity change. Negative for appetite change, fatigue and fever.  HENT:  Positive for congestion, postnasal drip and sneezing. Negative for sinus pressure and sore throat.   Respiratory:  Positive for cough and shortness of breath.   Cardiovascular:  Negative for chest pain.  Gastrointestinal:  Negative for abdominal pain, diarrhea, nausea and vomiting.     Physical Exam Triage Vital Signs ED Triage Vitals  Encounter Vitals Group     BP 12/26/23 1507 105/68     Girls Systolic BP Percentile --      Girls Diastolic BP Percentile --      Boys Systolic BP Percentile --      Boys Diastolic BP Percentile --      Pulse Rate 12/26/23 1507 70     Resp 12/26/23 1507 18     Temp 12/26/23 1507 (!) 97.5 F (36.4 C)     Temp Source 12/26/23 1507 Oral     SpO2 12/26/23 1507 96 %     Weight 12/26/23 1506 140 lb (63.5 kg)     Height 12/26/23 1506 5' 6 (1.676 m)     Head Circumference --      Peak Flow --      Pain Score 12/26/23 1506 0     Pain Loc --      Pain Education --      Exclude from Growth Chart --    No data found.  Updated Vital Signs BP 105/68 (BP Location: Left Arm)   Pulse 70   Temp (!) 97.5 F (36.4 C) (Oral)   Resp 18   Ht 5' 6 (1.676 m)   Wt 140 lb (63.5 kg)   SpO2 96%   BMI 22.60 kg/m   Visual Acuity Right Eye Distance:   Left Eye Distance:   Bilateral Distance:    Right Eye Near:   Left Eye Near:    Bilateral  Near:     Physical Exam Vitals reviewed.  Constitutional:      General: He is awake.     Appearance: Normal appearance. He is well-developed. He is not ill-appearing.     Comments: Very pleasant male appears stated age in no acute distress sitting comfortably in exam room  HENT:  Head: Normocephalic and atraumatic.     Right Ear: Tympanic membrane, ear canal and external ear normal. Tympanic membrane is not erythematous or bulging.     Left Ear: Tympanic membrane, ear canal and external ear normal. There is impacted cerumen. Tympanic membrane is not erythematous or bulging.     Ears:     Comments: Left ear: Partial cerumen impaction able to visualize; approximately 30% of TM that appears normal.    Nose: Nose normal.     Mouth/Throat:     Pharynx: Uvula midline. Posterior oropharyngeal erythema and postnasal drip present. No oropharyngeal exudate or uvula swelling.  Cardiovascular:     Rate and Rhythm: Normal rate and regular rhythm.     Heart sounds: Normal heart sounds, S1 normal and S2 normal. No murmur heard. Pulmonary:     Effort: Pulmonary effort is normal. No accessory muscle usage or respiratory distress.     Breath sounds: No stridor. Wheezing present. No rhonchi or rales.     Comments: Scattered wheezing Neurological:     Mental Status: He is alert.  Psychiatric:        Behavior: Behavior is cooperative.      UC Treatments / Results  Labs (all labs ordered are listed, but only abnormal results are displayed) Labs Reviewed - No data to display  EKG   Radiology DG Chest 2 View Result Date: 12/26/2023 CLINICAL DATA:  One month of cough EXAM: CHEST - 2 VIEW COMPARISON:  04/21/2022 FINDINGS: The heart size and mediastinal contours are within normal limits. Both lungs are clear. The visualized skeletal structures are unremarkable. IMPRESSION: No active cardiopulmonary disease. Electronically Signed   By: Luke Bun M.D.   On: 12/26/2023 16:17     Procedures Procedures (including critical care time)  Medications Ordered in UC Medications  albuterol  (VENTOLIN  HFA) 108 (90 Base) MCG/ACT inhaler 2 puff (2 puffs Inhalation Given 12/26/23 1548)    Initial Impression / Assessment and Plan / UC Course  I have reviewed the triage vital signs and the nursing notes.  Pertinent labs & imaging results that were available during my care of the patient were reviewed by me and considered in my medical decision making (see chart for details).     Patient is well-appearing, afebrile, nontoxic, nontachycardic.  No indication for viral testing as patient has been symptomatic for months and this would not change his management.  Chest x-ray was obtained that showed no acute cardiopulmonary disease.  He was given albuterol  in clinic with improvement of wheezing and sent home with this medication to be used every 4-6 hours as needed.  Given persistent and ongoing congestion I am concerned for sinobronchitis.  Will cover with Augmentin  twice daily for 7 days.  No indication for dose adjustment based on metabolic panel from 04/21/2022 with a creatinine of 0.78 and calculated creatinine clearance of 118 mL/min.  Recommended that he use over-the-counter medication including Mucinex , Flonase , nasal seen/management as for additional symptom relief.  If he is not feeling better within a week or if anything worsens he is to return for reevaluation.  All questions were answered to patient's satisfaction and he expressed understanding and agreement of treatment plan.  Final Clinical Impressions(s) / UC Diagnoses   Final diagnoses:  Subacute cough  Sinobronchitis     Discharge Instructions      Your x-ray was normal with no evidence of pneumonia.  I believe that you have a sinus infection that is draining and causing your symptoms.  Start  Augmentin  twice daily for 7 days.  Use the albuterol  inhaler every 4-6 hours as needed.  I also recommend nasal saline  sinus rinses as well as Mucinex  and Flonase  to help your symptoms.  If you are not feeling better in a week please return for reevaluation.  If anything worsens and you have high fever, worsening cough, shortness of breath, chest pain you need to be seen immediately.     ED Prescriptions     Medication Sig Dispense Auth. Provider   amoxicillin -clavulanate (AUGMENTIN ) 875-125 MG tablet Take 1 tablet by mouth every 12 (twelve) hours. 14 tablet Drayven Marchena K, PA-C      PDMP not reviewed this encounter.   Sherrell Rocky POUR, PA-C 12/26/23 1702

## 2024-01-06 ENCOUNTER — Other Ambulatory Visit: Payer: Self-pay | Admitting: Family Medicine

## 2024-01-06 MED ORDER — AMPHETAMINE-DEXTROAMPHET ER 20 MG PO CP24: 20.0000 mg | ORAL_CAPSULE | Freq: Every day | ORAL | 0 refills | Status: DC

## 2024-01-06 NOTE — Telephone Encounter (Signed)
 Pt is requesting a refill for amphetamine -dextroamphetamine  (ADDERALL  XR) 20 MG 24 hr capsule.  Pharmacy: [facility] [phone]

## 2024-01-06 NOTE — Telephone Encounter (Signed)
 Last seen 07/01/23 and next f/u 01/24/24. Last refilled 12/09/23 #30.

## 2024-01-24 ENCOUNTER — Other Ambulatory Visit: Payer: Self-pay | Admitting: *Deleted

## 2024-01-24 ENCOUNTER — Ambulatory Visit: Payer: 59 | Admitting: Neurology

## 2024-01-24 ENCOUNTER — Encounter: Payer: Self-pay | Admitting: Neurology

## 2024-01-24 DIAGNOSIS — G35 Multiple sclerosis: Secondary | ICD-10-CM

## 2024-01-27 ENCOUNTER — Inpatient Hospital Stay (HOSPITAL_COMMUNITY): Admission: RE | Admit: 2024-01-27 | Source: Ambulatory Visit

## 2024-01-27 ENCOUNTER — Ambulatory Visit (HOSPITAL_COMMUNITY)
Admission: RE | Admit: 2024-01-27 | Discharge: 2024-01-27 | Disposition: A | Discharge status: Home / Self Care | Source: Ambulatory Visit | Attending: Neurology | Admitting: Neurology

## 2024-01-27 VITALS — BP 102/63 | HR 62 | Temp 98.2°F | Resp 15

## 2024-01-27 DIAGNOSIS — G35 Multiple sclerosis: Secondary | ICD-10-CM | POA: Diagnosis present

## 2024-01-27 MED ORDER — SODIUM CHLORIDE 0.9 % IV SOLN
600.0000 mg | Freq: Once | INTRAVENOUS | Status: AC
  Administered 2024-01-27: 600 mg via INTRAVENOUS
  Filled 2024-01-27: qty 20

## 2024-01-27 MED ORDER — DIPHENHYDRAMINE HCL 25 MG PO CAPS
ORAL_CAPSULE | ORAL | Status: AC
  Filled 2024-01-27: qty 2

## 2024-01-27 MED ORDER — ACETAMINOPHEN 325 MG PO TABS
650.0000 mg | ORAL_TABLET | Freq: Once | ORAL | Status: AC
  Administered 2024-01-27: 650 mg via ORAL

## 2024-01-27 MED ORDER — DIPHENHYDRAMINE HCL 25 MG PO CAPS
50.0000 mg | ORAL_CAPSULE | Freq: Once | ORAL | Status: AC
  Administered 2024-01-27: 50 mg via ORAL

## 2024-01-27 MED ORDER — METHYLPREDNISOLONE SODIUM SUCC 125 MG IJ SOLR
INTRAMUSCULAR | Status: AC
  Filled 2024-01-27: qty 2

## 2024-01-27 MED ORDER — METHYLPREDNISOLONE SODIUM SUCC 125 MG IJ SOLR
125.0000 mg | Freq: Once | INTRAMUSCULAR | Status: AC
  Administered 2024-01-27: 125 mg via INTRAVENOUS

## 2024-01-27 MED ORDER — ACETAMINOPHEN 325 MG PO TABS
ORAL_TABLET | ORAL | Status: AC
  Filled 2024-01-27: qty 2

## 2024-02-09 ENCOUNTER — Other Ambulatory Visit: Payer: Self-pay | Admitting: Family Medicine

## 2024-02-09 MED ORDER — AMPHETAMINE-DEXTROAMPHET ER 20 MG PO CP24: 20.0000 mg | ORAL_CAPSULE | Freq: Every day | ORAL | 0 refills | Status: DC

## 2024-02-09 NOTE — Telephone Encounter (Signed)
 Last seen 07/01/23 and next f/u 03/31/24. Last refilled 01/08/24 #6 and 01/13/24 #23

## 2024-02-09 NOTE — Telephone Encounter (Signed)
 Pt called to request Medication refill   amphetamine -dextroamphetamine  (ADDERALL  XR) 20 MG 24 hr capsule  Pt medication is to be sent to   [id number] [address] [place], [place] - [address] (Ph: [phone])

## 2024-03-06 NOTE — Patient Instructions (Incomplete)

## 2024-03-06 NOTE — Progress Notes (Unsigned)
PATIENT: Mike Lowery DOB: 1985-08-18  REASON FOR VISIT: follow up HISTORY FROM: patient  No chief complaint on file.    HISTORY OF PRESENT ILLNESS:  03/06/24 ALL:  He returns for RRMS follow up. He continues to tolerate Ocrevus . Next infusion scheduled for 07/2023 at [facility]. MRI brain and cervical spine stable 11/2023. Labs have been stable.   He feels he is doing about the same from an MS perspective. No new or exacerbating symptoms. He continues to complain of generalized pain and stiffness. Baclofen  20mg  TID continues to help with spasticity. He takes gabapentin  600 TID for pain that seems to help some. He has not taken tizanidine  started at last visit with Dr Vear 11/2022. Unclear why. He was started on valium  for muscle spasms interrupting sleep 03/2023.   He continues Adderall  that helps with attention and fatigue. Last filled 30 tablet 03/16/2023. He called for refill 04/21/23 but not placed. Unclear why. He continues valium  for sleep aid and feels it helps. He gets 4-5 hours of sleep but wakes due to pain. He will nap sometimes during the day. Mood stable. He was exercising at gym 4-5 times a week but hasn't gone as frequently over the past couple of months.   No changes in vision, bowel or bladder habits.   He lives alone. He drives without difficulty.    History (copied from Dr Duncan previous note)  Mike Lowery,is a 38 y.o. man with relapsing remitting multiple sclerosis.     Update  12/16/2022: He is on Ocrevus  and tolerates it well.   His last infusion was 12/2021    He has not had any exacerbation while on it.      IgM and IgA were reduced at last visit.  IgM was 14.  IgG was normal.  MRI of the brain 01/09/2022 showed no new lesions.   He reports more spasms in his legs > shoulders.  This is worse at night. Left side is worse.   Sometimes walking triggers the spasms.    He is on baclofen  20 mg tid.   He has not been on tizanidine .     His gait is slightly worse and  he uses the cane daily.  Dalfampridine  had not helped s was stopped.  The left side is weaker than right and has more spasticity.     He has pain,  numbness and tingling in the left leg.   SABRA   He has mild urinary hesitancy that is stable.    Vision is doing well.   He notes mild fatigue that fluctuates a lot.  Adderall  helps some.    He avoids going outside in the heat.   Due to dysesthesias and spasms, he sometimes sleeps poorly- sleep mainenance worse than onset.    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   MS HISTORY:  In July/Aug 2015, he had the onset of weakness and numbness in the arms and legs, a little worse on the left.   When symptoms persisted, he presented to [facility] emergency room. MRI was consistent with multiple sclerosis and he was admitted. The MRI of the brain showed several plaques, mostly in the periventricular white matter. The MRI of the cervical spine showed several large T2 hyperintense foci,  with some enhancement. He was given IV Solu-Medrol  and his symptoms improved quite a bit. He followed up with Dr. Skeet and was placed on Plegridy about 8 or 9 months after starting Plegridy, he had another exacerbation and MRI of the brain and cervical spine were performed again 02/21/2015. The MRI of the cervical spine does not show any definite new lesions. However, the MRI of the brain does show several new lesions including for small enhancing foci. He was switched to Tecfidera  November or December 2016.   I have personally reviewed the MRIs of the brain and cervical spine from October 2016. The MRI of the brain shows multiple T2/FLAIR hyperintense foci consistent with MS in the hemispheres and brainstem.   4 of the hemispheric small foci enhanced after gadolinium administration. The MRI of the cervical spine shows multiple  hyperintense foci. There was a normal enhancement pattern.   He had a large exacerbation while on Tecfidera  with new gait difficulties March 2018.   MRI's show multiple new lesions in the brain with several lesions enhancing including a larger one in the left periventrivcular white matter.    Also has a new focus adjacent to C4 in the spinal cord.  He was switched to ocrelizumab  and had his first infusion at the end of March 2018.      MRI images MRI of the brain 06/04/2020 showed T2/flair hyperintense foci in the brainstem, left cerebellar hemisphere, thalamus and the hemispheres.  None of the foci enhanced.  No changes compared to the 12/28/2019 MRI.   MRI of the cervical spine 06/04/2020 and 07/16/2016 showed multiple T2 hyperintense foci within the spinal cord.  They are somewhat confluent and primarily located posteriorly adjacent to C2, posteriorly and more to the left adjacent to C3-C4, posteriorly more to the right adjacent to C4-C5, posteriorly adjacent to C5-C6, more to the left adjacent to C6-C7 and posteriorly adjacent to C7-T1, adjacent to T1.  No enhancing lesions.  No change between 2018 and 2022.   MRI of the thoracic spine 06/04/2020 showed multiple T2 hyperintense foci within the spinal cord, no comparison films.  Number the foci appear to be acute.  They do not enhance.   MRI of the brain 12/28/2019 showed multiple T2/FLAIR hyperintense foci in the brainstem, left cerebellar hemisphere, left thalamus and in the periventricular, juxtacortical and deep white matter both hemispheres in a pattern and configuration consistent with chronic demyelinating plaque associated with multiple sclerosis.  None of the foci appear to be acute and they do not enhance.  Compared to the MRI from 01/01/2018, there are no new lesions.  Normal enhancement pattern and no acute findings   MRI of the brain 01/10/2022 shows T2/FLAIR hyperintense foci in the brainstem, cerebellum, left thalamus and hemispheres in a pattern  and configuration consistent with chronic demyelinating plaque associated with multiple sclerosis.  None of the foci appear to be acute.  They do not enhance.  Compared to the MRI from 06/04/2020, there are no new lesions.     REVIEW OF SYSTEMS: Out of a complete 14 system review of symptoms, the patient complains only of the following symptoms, left knee pain, fatigue and all other reviewed systems are negative.   ALLERGIES: No Known Allergies  HOME MEDICATIONS: Outpatient Medications Prior to Visit  Medication Sig Dispense Refill   acetaminophen  (TYLENOL ) 325 MG tablet Take 2 tablets (650 mg total) by mouth every 6 (six) hours as needed for fever. 20 tablet 0   amoxicillin -clavulanate (AUGMENTIN ) 875-125 MG  tablet Take 1 tablet by mouth every 12 (twelve) hours. 14 tablet 0   amphetamine -dextroamphetamine  (ADDERALL  XR) 20 MG 24 hr capsule Take 1 capsule (20 mg total) by mouth daily. 30 capsule 0   baclofen  (LIORESAL ) 20 MG tablet Take 1 tablet (20 mg total) by mouth 3 (three) times daily. 270 tablet 1   diazepam  (VALIUM ) 5 MG tablet Take 1 tablet (5 mg total) by mouth at bedtime. 90 tablet 1   gabapentin  (NEURONTIN ) 600 MG tablet TAKE 1/2 TO 1 TABLET BY MOUTH THREE TIMES DAILY 270 tablet 1   loratadine  (CLARITIN ) 10 MG tablet Take 1 tablet (10 mg total) by mouth daily as needed for allergies. 30 tablet 0   ocrelizumab  (OCREVUS ) 300 MG/10ML injection HOLD WHILE ACTIVE INFECTION. PLEASE CHECK WITH YOUR NEUROLOGIST ON WHEN TO RESTART. (Patient taking differently: Inject 300 mg into the vein every 6 (six) months.) 20 mL 0   No facility-administered medications prior to visit.    PAST MEDICAL HISTORY: Past Medical History:  Diagnosis Date   Depression    situaltional   Dyspnea    Multiple sclerosis 12/24/13   Multiple sclerosis    Vision abnormalities     PAST SURGICAL HISTORY: Past Surgical History:  Procedure Laterality Date   NO PAST SURGERIES      FAMILY HISTORY: Family  History  Problem Relation Age of Onset   Healthy Mother    Healthy Father    Cancer Maternal Grandmother        unknown    Ataxia Sister    Multiple sclerosis Neg Hx     SOCIAL HISTORY: Social History   Socioeconomic History   Marital status: Single    Spouse name: Not on file   Number of children: Not on file   Years of education: Not on file   Highest education level: Not on file  Occupational History   Not on file  Tobacco Use   Smoking status: Former    Current packs/day: 0.00    Types: Cigarettes, Cigars    Quit date: 04/18/2013    Years since quitting: 10.8   Smokeless tobacco: Former    Quit date: 09/08/2014  Vaping Use   Vaping status: Some Days  Substance and Sexual Activity   Alcohol use: Yes   Drug use: No    Types: Marijuana    Comment: former   Sexual activity: Yes    Partners: Female  Other Topics Concern   Not on file  Social History Narrative   Right Handed    Occasional Coffee Drinker   Occasional Soda Drinker   Social Drivers of Health   Financial Resource Strain: Not on file  Food Insecurity: No Food Insecurity (04/15/2022)   Hunger Vital Sign    Worried About Running Out of Food in the Last Year: Never true    Ran Out of Food in the Last Year: Never true  Transportation Needs: No Transportation Needs (04/15/2022)   PRAPARE - Administrator, Civil Service (Medical): No    Lack of Transportation (Non-Medical): No  Physical Activity: Not on file  Stress: Not on file  Social Connections: Not on file  Intimate Partner Violence: Not At Risk (04/15/2022)   Humiliation, Afraid, Rape, and Kick questionnaire    Fear of Current or Ex-Partner: No    Emotionally Abused: No    Physically Abused: No    Sexually Abused: No      PHYSICAL EXAM  There were no vitals filed for  this visit.   There is no height or weight on file to calculate BMI.  Generalized: Well developed, in no acute distress  Cardiology: normal rate and rhythm,  no murmur noted Respiratory: clear to auscultation bilaterally  Neurological examination  Mentation: Alert oriented to time, place, history taking. Follows all commands speech and language fluent Cranial nerve II-XII: Pupils were equal round reactive to light. Extraocular movements were full, visual field were full on confrontational test. Facial sensation and strength were normal. Head turning and shoulder shrug  were normal and symmetric. Motor: The motor testing reveals 5 over 5 strength of all 4 extremities. Good symmetric motor tone is noted throughout. ROM intact but pain reported with full extension, No obvious edema noted, no pain with palpation of knee joint.  Sensory: Sensory testing is intact to soft touch on all 4 extremities. No evidence of extinction is noted.  Coordination: Cerebellar testing reveals good finger-nose-finger and heel-to-shin bilaterally.  Gait and station: Pushes up with right arm. Gait is wide and spastic, left foot drop, slight left limp, using single prong cane. Unable to tandem.     DIAGNOSTIC DATA (LABS, IMAGING, TESTING) - I reviewed patient records, labs, notes, testing and imaging myself where available.      No data to display           Lab Results  Component Value Date   WBC 4.6 07/01/2023   HGB 13.5 07/01/2023   HCT 41.2 07/01/2023   MCV 89 07/01/2023   PLT 332 07/01/2023      Component Value Date/Time   NA 138 04/21/2022 2320   NA 142 12/29/2021 1104   K 3.9 04/21/2022 2320   CL 104 04/21/2022 2320   CO2 24 04/21/2022 2320   GLUCOSE 91 04/21/2022 2320   BUN 7 04/21/2022 2320   BUN 9 12/29/2021 1104   CREATININE 0.78 04/21/2022 2320   CREATININE 0.92 03/27/2015 1130   CALCIUM 8.6 (L) 04/21/2022 2320   PROT 6.1 (L) 04/16/2022 0401   PROT 6.4 12/29/2021 1104   ALBUMIN 3.7 04/16/2022 0401   ALBUMIN 4.7 12/29/2021 1104   AST 27 04/16/2022 0401   ALT 39 04/16/2022 0401   ALKPHOS 53 04/16/2022 0401   BILITOT 0.4 04/16/2022 0401    BILITOT 0.6 12/29/2021 1104   GFRNONAA >60 04/21/2022 2320   GFRNONAA >89 10/10/2014 1251   GFRAA 103 06/22/2019 0850   GFRAA >89 10/10/2014 1251   No results found for: CHOL, HDL, LDLCALC, LDLDIRECT, TRIG, CHOLHDL Lab Results  Component Value Date   HGBA1C 4.9 06/05/2020   Lab Results  Component Value Date   VITAMINB12 242 07/02/2021   No results found for: TSH     ASSESSMENT AND PLAN 38 y.o. year old male  has a past medical history of Depression, Dyspnea, Multiple sclerosis (12/24/13), Multiple sclerosis, and Vision abnormalities. here with   No diagnosis found.    Mr Luten is doing fairly well on Ocrevus . No new symptoms. We will update labs. We will continue Ocrevus  infusions. Continue baclofen , gabapentin , valium  and Adderall  as prescribed. Will update imaging. Referral placed to PCP.  He will return in 6 months for follow up. He verbalizes understanding and agreement with this plan.    No orders of the defined types were placed in this encounter.    No orders of the defined types were placed in this encounter.    Greig Forbes, FNP-C 03/06/2024, 8:04 AM [facility] [address], [address] [place], [place] [address] [phone])  [phone]  

## 2024-03-07 ENCOUNTER — Ambulatory Visit (INDEPENDENT_AMBULATORY_CARE_PROVIDER_SITE_OTHER): Admitting: Family Medicine

## 2024-03-07 ENCOUNTER — Encounter: Payer: Self-pay | Admitting: Family Medicine

## 2024-03-07 VITALS — BP 133/83 | HR 72 | Wt 142.5 lb

## 2024-03-07 DIAGNOSIS — R296 Repeated falls: Secondary | ICD-10-CM

## 2024-03-07 DIAGNOSIS — G8929 Other chronic pain: Secondary | ICD-10-CM

## 2024-03-07 DIAGNOSIS — Z79899 Other long term (current) drug therapy: Secondary | ICD-10-CM | POA: Diagnosis not present

## 2024-03-07 DIAGNOSIS — R252 Cramp and spasm: Secondary | ICD-10-CM | POA: Diagnosis not present

## 2024-03-07 DIAGNOSIS — R5383 Other fatigue: Secondary | ICD-10-CM

## 2024-03-07 DIAGNOSIS — G35A Relapsing-remitting multiple sclerosis: Secondary | ICD-10-CM | POA: Diagnosis not present

## 2024-03-07 DIAGNOSIS — R3911 Hesitancy of micturition: Secondary | ICD-10-CM

## 2024-03-07 DIAGNOSIS — M25512 Pain in left shoulder: Secondary | ICD-10-CM

## 2024-03-07 DIAGNOSIS — R269 Unspecified abnormalities of gait and mobility: Secondary | ICD-10-CM | POA: Diagnosis not present

## 2024-03-07 DIAGNOSIS — G4700 Insomnia, unspecified: Secondary | ICD-10-CM

## 2024-03-07 DIAGNOSIS — F32A Depression, unspecified: Secondary | ICD-10-CM

## 2024-03-08 ENCOUNTER — Ambulatory Visit: Payer: Self-pay | Admitting: Family Medicine

## 2024-03-08 LAB — CBC WITH DIFFERENTIAL/PLATELET
Basophils Absolute: 0 x10E3/uL (ref 0.0–0.2)
Basos: 1 %
EOS (ABSOLUTE): 0.2 x10E3/uL (ref 0.0–0.4)
Eos: 3 %
Hematocrit: 42.3 % (ref 37.5–51.0)
Hemoglobin: 14 g/dL (ref 13.0–17.7)
Immature Grans (Abs): 0 x10E3/uL (ref 0.0–0.1)
Immature Granulocytes: 0 %
Lymphocytes Absolute: 1.8 x10E3/uL (ref 0.7–3.1)
Lymphs: 37 %
MCH: 29.6 pg (ref 26.6–33.0)
MCHC: 33.1 g/dL (ref 31.5–35.7)
MCV: 89 fL (ref 79–97)
Monocytes Absolute: 0.5 x10E3/uL (ref 0.1–0.9)
Monocytes: 9 %
Neutrophils Absolute: 2.4 x10E3/uL (ref 1.4–7.0)
Neutrophils: 50 %
Platelets: 267 x10E3/uL (ref 150–450)
RBC: 4.73 x10E6/uL (ref 4.14–5.80)
RDW: 12.8 % (ref 11.6–15.4)
WBC: 4.8 x10E3/uL (ref 3.4–10.8)

## 2024-03-08 LAB — IGG, IGA, IGM
IgA/Immunoglobulin A, Serum: 64 mg/dL — ABNORMAL LOW (ref 90–386)
IgG (Immunoglobin G), Serum: 690 mg/dL (ref 603–1613)
IgM (Immunoglobulin M), Srm: 14 mg/dL — ABNORMAL LOW (ref 20–172)

## 2024-03-08 NOTE — Therapy (Incomplete)
 OUTPATIENT PHYSICAL THERAPY NEURO EVALUATION   Patient Name: Mike Lowery MRN: [id number] DOB:1985/09/13, 38 y.o., male Today's Date: 03/08/2024   PCP: Vear Charlie LABOR, MD REFERRING PROVIDER: Cary No, NP  END OF SESSION:   Past Medical History:  Diagnosis Date   Depression    situaltional   Dyspnea    Multiple sclerosis 12/24/13   Multiple sclerosis    Vision abnormalities    Past Surgical History:  Procedure Laterality Date   NO PAST SURGERIES     Patient Active Problem List   Diagnosis Date Noted   Leukopenia 04/17/2022   Hyperphosphatemia 04/17/2022   Influenza B 04/14/2022   Acute left-sided weakness 04/14/2022   Left hemiparesis (HCC) 04/14/2022   Substance-induced anxiety disorder (HCC) 02/22/2022   Marijuana use 02/22/2022   MDD (major depressive disorder), recurrent episode, severe (HCC) 02/21/2022   Overdose of undetermined intent 02/21/2022   Urinary hesitancy 12/29/2021   Acute COVID-19 06/30/2021   COVID-19 virus infection 06/04/2020   High risk sexual behavior 12/22/2017   Immunosuppression 12/22/2017   Screening for human immunodeficiency virus 12/22/2017   High risk medication use 12/21/2016   Hepatitis B antibody positive 07/19/2016   Spasticity 06/17/2016   Spells 07/22/2015   Depression with anxiety 07/02/2015   Other fatigue 05/01/2015   Numbness 05/01/2015   Gait disturbance 05/01/2015   Disturbed cognition 05/01/2015   Erectile dysfunction 05/01/2015   Multiple sclerosis exacerbation 03/11/2015   Tobacco abuse 03/06/2015   Nausea without vomiting 11/08/2014   Depression due to multiple sclerosis 11/08/2014   Relapsing remitting multiple sclerosis 10/01/2014   Swelling of both hands 05/14/2014   Multiple sclerosis 12/24/2013    ONSET DATE: 03/07/2024 (Date of referral)  REFERRING DIAG: G35.A (ICD-10-CM) - Relapsing remitting multiple sclerosis R26.9 (ICD-10-CM) - Gait disturbance R25.2 (ICD-10-CM) - Spasticity  THERAPY DIAG:   No diagnosis found.  Rationale for Evaluation and Treatment: Rehabilitation  SUBJECTIVE:                                                                                                                                                                                             SUBJECTIVE STATEMENT: *** Pt accompanied by: {accompnied:27141}  PERTINENT HISTORY: Relapsing remitting MS.  Spasms L>R and legs>shoulders; worse at night.  On baclofen .  Per NP [place] note 03/07/24 pt referred for gait difficulty, frequent falls, and L shoulder pain.  PMH includes dyspnea, depression, MS (01/03/14), and vision abnormalities.  PAIN:  Are you having pain? Yes: NPRS scale: *** Pain location: *** Pain description: *** Aggravating factors: *** Relieving factors: ***  PRECAUTIONS: Fall  RED FLAGS: {PT Red Flags:29287}  WEIGHT BEARING RESTRICTIONS: No  FALLS: Has patient fallen in last 6 months? {fallsyesno:27318}  LIVING ENVIRONMENT: Lives with: lives alone Lives in: {Lives in:25570} Stairs: {opstairs:27293} Has following equipment at home: {Assistive devices:23999}  PLOF: {PLOF:24004}Uses SPC.  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   PATIENT GOALS: ***  OBJECTIVE:  Note: Objective measures were completed at Evaluation unless otherwise noted.  COGNITION: Overall cognitive status: {cognition:24006}   SENSATION: {sensation:27233}  COORDINATION: ***  EDEMA:  {edema:24020}  MUSCLE TONE: {LE tone:25568}  MUSCLE LENGTH: Hamstrings: Right *** deg; Left *** deg Debby test: Right *** deg; Left *** deg  POSTURE: {posture:25561}  LOWER EXTREMITY ROM:     Active  Right Eval Left Eval  Hip flexion    Hip extension    Hip abduction    Hip adduction    Hip internal rotation    Hip external rotation    Knee flexion    Knee extension    Ankle dorsiflexion    Ankle plantarflexion    Ankle inversion    Ankle eversion     (Blank rows = not tested)  LOWER  EXTREMITY MMT:    MMT Right Eval Left Eval  Hip flexion    Hip extension    Hip abduction    Hip adduction    Hip internal rotation    Hip external rotation    Knee flexion    Knee extension    Ankle dorsiflexion    Ankle plantarflexion    Ankle inversion    Ankle eversion    (Blank rows = not tested)  BED MOBILITY:  {bed mobility:32615:p}  TRANSFERS: {transfers eval:32620}  GAIT: Findings: {GaitneuroPT:32644::Distance walked: ***,Comments: ***}  FUNCTIONAL TESTS:  {Functional tests:24029}  PATIENT SURVEYS:  {rehab surveys:24030}                                                                                                                              TREATMENT DATE: ***    PATIENT EDUCATION: Education details: ***Eval results; POC Person educated: Patient Education method: {Education Method:25205} Education comprehension: {Education Comprehension:25206}  HOME EXERCISE PROGRAM: ***  GOALS: Goals reviewed with patient? Yes  SHORT TERM GOALS: Target date: ***  *** Baseline: Goal status: INITIAL  2.  *** Baseline:  Goal status: INITIAL  3.  *** Baseline:  Goal status: INITIAL  4.  *** Baseline:  Goal status: INITIAL  5.  *** Baseline:  Goal status: INITIAL  6.  *** Baseline:  Goal status: INITIAL  LONG TERM GOALS: Target date: ***  *** Baseline:  Goal status: INITIAL  2.  *** Baseline:  Goal status: INITIAL  3.  *** Baseline:  Goal status: INITIAL  4.  *** Baseline:  Goal status: INITIAL  5.  *** Baseline:  Goal status: INITIAL  6.  *** Baseline:  Goal status: INITIAL  ASSESSMENT:  CLINICAL IMPRESSION: Patient is a 38 y.o. male who was seen today for physical therapy evaluation and  treatment for relapsing remitting multiple sclerosis, gait disturbance, and spasticity.  Patient presents with ***. These impairments are limiting patient from ***.  Evaluation included the following assessment tools: ***.   Patient  will benefit from skilled PT to address noted impairments, improve overall function, and progress towards long term goals.  OBJECTIVE IMPAIRMENTS: {opptimpairments:25111}.   ACTIVITY LIMITATIONS: {activitylimitations:27494}  PARTICIPATION LIMITATIONS: {participationrestrictions:25113}  PERSONAL FACTORS: {Personal factors:25162} are also affecting patient's functional outcome.   REHAB POTENTIAL: {rehabpotential:25112}  CLINICAL DECISION MAKING: {clinical decision making:25114}  EVALUATION COMPLEXITY: {Evaluation complexity:25115}  PLAN:  PT FREQUENCY: {rehab frequency:25116}  PT DURATION: {rehab duration:25117}  PLANNED INTERVENTIONS: {rehab planned interventions:25118::97110-Therapeutic exercises,97530- Therapeutic [phone]- Neuromuscular re-education,97535- Self Rjmz,02859- Manual therapy,Patient/Family education}  PLAN FOR NEXT SESSION: ***   Damien Caulk, PT 03/08/2024, 12:41 PM

## 2024-03-09 ENCOUNTER — Ambulatory Visit: Attending: Family Medicine | Admitting: Physical Therapy

## 2024-03-09 DIAGNOSIS — G35A Relapsing-remitting multiple sclerosis: Secondary | ICD-10-CM | POA: Insufficient documentation

## 2024-03-09 DIAGNOSIS — R2689 Other abnormalities of gait and mobility: Secondary | ICD-10-CM | POA: Insufficient documentation

## 2024-03-09 DIAGNOSIS — R252 Cramp and spasm: Secondary | ICD-10-CM | POA: Insufficient documentation

## 2024-03-10 ENCOUNTER — Ambulatory Visit: Admitting: Physical Therapy

## 2024-03-10 ENCOUNTER — Other Ambulatory Visit: Payer: Self-pay

## 2024-03-10 ENCOUNTER — Other Ambulatory Visit: Payer: Self-pay | Admitting: Family Medicine

## 2024-03-10 ENCOUNTER — Encounter: Payer: Self-pay | Admitting: Physical Therapy

## 2024-03-10 VITALS — BP 107/67 | HR 55

## 2024-03-10 DIAGNOSIS — G35A Relapsing-remitting multiple sclerosis: Secondary | ICD-10-CM | POA: Diagnosis present

## 2024-03-10 DIAGNOSIS — R252 Cramp and spasm: Secondary | ICD-10-CM | POA: Diagnosis present

## 2024-03-10 DIAGNOSIS — R2689 Other abnormalities of gait and mobility: Secondary | ICD-10-CM | POA: Diagnosis present

## 2024-03-10 MED ORDER — AMPHETAMINE-DEXTROAMPHET ER 20 MG PO CP24: 20.0000 mg | ORAL_CAPSULE | Freq: Every day | ORAL | 0 refills | Status: DC

## 2024-03-10 NOTE — Therapy (Signed)
 OUTPATIENT PHYSICAL THERAPY NEURO EVALUATION   Patient Name: Mike Lowery MRN: [id number] DOB:06/09/1985, 38 y.o., male Today's Date: 03/10/2024   PCP: Vear Charlie LABOR, MD REFERRING PROVIDER: Cary No, NP  END OF SESSION:  PT End of Session - 03/10/24 9147     Visit Number 1    Number of Visits 17   16 plus Eval   Date for Recertification  05/19/24    PT Start Time 0850   Pt arrived late   PT Stop Time 0932    PT Time Calculation (min) 42 min    Equipment Utilized During Treatment Gait belt    Activity Tolerance Patient tolerated treatment well;Other (comment)   Intermittent spasms reported during session   Behavior During Therapy [facility] for tasks assessed/performed          Past Medical History:  Diagnosis Date   Depression    situaltional   Dyspnea    Multiple sclerosis 12/24/13   Multiple sclerosis    Vision abnormalities    Past Surgical History:  Procedure Laterality Date   NO PAST SURGERIES     Patient Active Problem List   Diagnosis Date Noted   Leukopenia 04/17/2022   Hyperphosphatemia 04/17/2022   Influenza B 04/14/2022   Acute left-sided weakness 04/14/2022   Left hemiparesis (HCC) 04/14/2022   Substance-induced anxiety disorder (HCC) 02/22/2022   Marijuana use 02/22/2022   MDD (major depressive disorder), recurrent episode, severe (HCC) 02/21/2022   Overdose of undetermined intent 02/21/2022   Urinary hesitancy 12/29/2021   Acute COVID-19 06/30/2021   COVID-19 virus infection 06/04/2020   High risk sexual behavior 12/22/2017   Immunosuppression 12/22/2017   Screening for human immunodeficiency virus 12/22/2017   High risk medication use 12/21/2016   Hepatitis B antibody positive 07/19/2016   Spasticity 06/17/2016   Spells 07/22/2015   Depression with anxiety 07/02/2015   Other fatigue 05/01/2015   Numbness 05/01/2015   Gait disturbance 05/01/2015   Disturbed cognition 05/01/2015   Erectile dysfunction 05/01/2015   Multiple sclerosis  exacerbation 03/11/2015   Tobacco abuse 03/06/2015   Nausea without vomiting 11/08/2014   Depression due to multiple sclerosis 11/08/2014   Relapsing remitting multiple sclerosis 10/01/2014   Swelling of both hands 05/14/2014   Multiple sclerosis 12/24/2013    ONSET DATE: 03/07/2024 (Date of referral)  REFERRING DIAG: G35.A (ICD-10-CM) - Relapsing remitting multiple sclerosis R26.9 (ICD-10-CM) - Gait disturbance R25.2 (ICD-10-CM) - Spasticity  THERAPY DIAG:  Other abnormalities of gait and mobility  Relapsing remitting multiple sclerosis  Spasticity  Rationale for Evaluation and Treatment: Rehabilitation  SUBJECTIVE:  SUBJECTIVE STATEMENT: Pt reports he came to therapy d/t pain/spasms and NP said stretching could help it.  Pt reports balance is off (falls multiple times a week).  Is an active person (stopped working out last 2-3 months d/t pain). Pt accompanied by: self  PERTINENT HISTORY: Relapsing remitting MS.  Spasms L>R and legs>shoulders; worse at night.  On baclofen .  Per NP [place] note 03/07/24 pt referred for gait difficulty, frequent falls, and L shoulder pain.  PMH includes dyspnea, depression, MS (01/03/14), and vision abnormalities.  PAIN:  Are you having pain? Yes: NPRS scale: 3/10 (but gets up to 9/10 with spasms) Pain location: L>R shoulder/neck area to torso Pain description: sharp shooting and everything tightens up, spasms Aggravating factors: just walking or sitting Relieving factors: pain medications help  PRECAUTIONS: Fall; pt reports falling all the time (walks around home without cane and holds onto furniture--doesn't like to walk with cane in home for sanitary reasons and in general)  RED FLAGS: None   WEIGHT BEARING RESTRICTIONS: No  FALLS: Has patient fallen in  last 6 months? Yes. Number of falls multiples falls a week (2-3)  LIVING ENVIRONMENT: Lives with: lives alone Lives in: House/apartment Stairs: No Has following equipment at home: Single point cane and Walker - 2 wheeled  PLOF: Independent. Uses SPC.  Currently not working. (+) driving.  PATIENT GOALS: To make spasms go away.  Strengthen L leg.  Improve balance.  OBJECTIVE:  Note: Objective measures were completed at Evaluation unless otherwise noted.  COGNITION: Overall cognitive status: Within functional limits for tasks assessed   SENSATION: Decreased light touch L lateral thigh, medial/lateral lower L leg, and medial L foot  COORDINATION: Decreased L LE (appeared decreased L LE d/t impaired strength and tone)   MUSCLE TONE: Increased tone L LE   POSTURE: rounded shoulders, forward head, and posterior pelvic tilt  LOWER EXTREMITY ROM:     Active  Right Eval Left Eval  Hip flexion WNL At least 90 degrees (limited d/t increased tone)  Hip extension    Hip abduction    Hip adduction    Hip internal rotation    Hip external rotation    Knee flexion WNL WFL (limited d/t increased tone)  Knee extension WNL WNL  Ankle dorsiflexion WNL WFL  Ankle plantarflexion    Ankle inversion    Ankle eversion     (Blank rows = not tested)  LOWER EXTREMITY MMT:    MMT Right Eval Left Eval  Hip flexion 5/5 4+/5  Hip extension    Hip abduction    Hip adduction    Hip internal rotation    Hip external rotation    Knee flexion 5/5 3+/5  Knee extension 5/5 3+/5  Ankle dorsiflexion 5/5 2+/5  Ankle plantarflexion At least 3/5 AROM At least 2/5 AROM  Ankle inversion    Ankle eversion    (Blank rows = not tested)  TRANSFERS: Sit to stand: Modified independence  Assistive device utilized: None     Stand to sit: Modified independence  Assistive device utilized: None      GAIT: Findings: Gait Characteristics: L LE externally rotated; decreased L LE foot clearance and heel  strike; increased R lateral lean during R LE stance phase; L LE appearing stiff; decreased L UE arm swing, Distance walked: clinic distances, Assistive device utilized:Single point cane, Level of assistance: Modified independence, and Comments: Pt SBA ambulating without SPC during session.  FUNCTIONAL TESTS:  5 times sit to stand: 18.44 seconds; no  UE support 10 meter walk test: 0.20 m/sec 1st trial and 0.37 m/sec 2nd trial (49.04 seconds 1st trial (limited d/t spasm middle of walking); 26.88 seconds 2nd trial; pt carrying SPC)  PATIENT SURVEYS:  ABC Scale: TBA                                                                                                                             TREATMENT DATE: 03/10/24   Self Care: BP and HR taken in sitting at rest during of session (see below for details) Vitals:   03/10/24 0909  BP: 107/67  Pulse: (!) 55  Pt educated on fall prevention and to utilize RW or SPC in home for support/balance (encouraged pt to think about situations where he typically falls and to use RW or SPC for support in those situations)   PATIENT EDUCATION: Education details: Eval results; POC; fall prevention and DME use Person educated: Patient Education method: Explanation and Verbal cues Education comprehension: verbalized understanding and verbal cues required  HOME EXERCISE PROGRAM: TBA  GOALS: Goals reviewed with patient? Yes  SHORT TERM GOALS: Target date: 04/07/2024  Pt will be independent with initial HEP in order to improve strength and balance in order to decrease fall risk and improve function at home for ADL's. Baseline: TBA Goal status: INITIAL  2.  Assess FGA and update LTG for FGA if needed Baseline: TBA Goal status: INITIAL  3.  Patient will tolerate 5 seconds of single leg stance B LE's without loss of balance to improve ability to get in and out of shower safely. Baseline: TBA Goal status: INITIAL  LONG TERM GOALS: Target date:  05/05/2024  Pt will be independent with final HEP in order to improve strength and balance in order to decrease fall risk and improve function at home for ADL's. Baseline: TBA Goal status: INITIAL  2.  Pt will decrease 5 Time Sit to Stand by at least 3 seconds in order to demonstrate clinically significant improvement in LE strength. Baseline: 18.44 seconds (Eval) Goal status: INITIAL  3.  Pt will increase by at least 0.13 m/s in order to demonstrate clinically significant improvement in community ambulation.  Baseline: 0.20 m/sec 1st trial and 0.37 m/sec 2nd trial Goal status: INITIAL  4.  Patient will increase Functional Gait Assessment score to >20/30 as to reduce fall risk and improve dynamic gait safety with community ambulation. Baseline: TBA Goal status: INITIAL  5.  Patient will deny any falls over past 4 weeks to demonstrate improved balance at home and in community. Baseline: Multiple falls a week. Goal status: INITIAL  6.  Patient will report a worst pain of 3/10 on VAS in order to improve tolerance with ADLs and reduced symptoms with activities.  Baseline: 3/10 (but gets up to 9/10 with spasms)  Goal status: INITIAL   ASSESSMENT:  CLINICAL IMPRESSION: Patient is a 38 y.o. male who was seen today for physical therapy evaluation and treatment for relapsing  remitting multiple sclerosis, gait disturbance, and spasticity.  Patient presents with L LE weakness, increased L LE tone, pain, balance, h/o falls, and gait impairments.  Pt intermittently needing to stop during session d/t spasms. These impairments are limiting patient from daily ADL's and functional mobility.  Evaluation included the following assessment tools: 5 time sit to stand and .  Pt scored 18.44 seconds on the 5 time sit to stand test indicating pt is at increased risk of falls.  Pt scored 0.20 m/sec 1st trial (pt had spasm mid test requiring standing pause) and 0.37 m/sec 2nd trial on the 10 Meter Walk  Test indicating pt is a household Ambulator and increased fall risk.  Patient will benefit from skilled PT to address noted impairments, improve overall function, and progress towards long term goals.  OBJECTIVE IMPAIRMENTS: Abnormal gait, decreased activity tolerance, decreased balance, decreased coordination, decreased endurance, decreased knowledge of condition, decreased knowledge of use of DME, decreased mobility, difficulty walking, decreased ROM, decreased strength, decreased safety awareness, increased muscle spasms, impaired flexibility, impaired sensation, impaired tone, improper body mechanics, postural dysfunction, and pain.   ACTIVITY LIMITATIONS: carrying, lifting, bending, sitting, standing, squatting, stairs, transfers, bathing, toileting, dressing, and locomotion level  PARTICIPATION LIMITATIONS: meal prep, cleaning, laundry, shopping, community activity, and occupation  PERSONAL FACTORS: Past/current experiences, Time since onset of injury/illness/exacerbation, and 3+ comorbidities: dyspnea, MS, and vision abnormalities are also affecting patient's functional outcome.   REHAB POTENTIAL: Fair d/t relapsing remitting multiple sclerosis   CLINICAL DECISION MAKING: Evolving/moderate complexity  EVALUATION COMPLEXITY: Moderate  PLAN:  PT FREQUENCY: 2x/week  PT DURATION: 8 weeks  PLANNED INTERVENTIONS: 97164- PT Re-evaluation, 97750- Physical Performance Testing, 97110-Therapeutic exercises, 97530- Therapeutic activity, [id number]- Neuromuscular re-education, 97535- Self Care, 02859- Manual therapy, [id number]- Gait training, [phone]- Orthotic Initial, [phone]- Orthotic/Prosthetic subsequent, [phone]- Aquatic Therapy, [phone]- Electrical stimulation (manual), [id number]- Ultrasound, [phone] (1-2 muscles), 20561 (3+ muscles)- Dry Needling, Patient/Family education, Balance training, Stair training, Taping, Joint mobilization, Spinal mobilization, Visual/preceptual remediation/compensation, DME instructions,  Cryotherapy, and Moist heat  PLAN FOR NEXT SESSION: Assess FGA and ABC Scale; LE strengthening; stretching; pain control; balance   Damien Caulk, PT 03/10/2024, 8:44 PM

## 2024-03-10 NOTE — Telephone Encounter (Signed)
 Pt called requesting a refill request to be sent for his amphetamine -dextroamphetamine  (ADDERALL  XR) 20 MG 24 hr capsule to the [facility] on [address]

## 2024-03-10 NOTE — Telephone Encounter (Signed)
 Last seen 03/07/24 and next f/u 10/05/24. Last refilled 02/09/24 #30.

## 2024-03-13 ENCOUNTER — Ambulatory Visit: Admitting: Physical Therapy

## 2024-03-13 VITALS — BP 113/58 | HR 65

## 2024-03-13 DIAGNOSIS — G35A Relapsing-remitting multiple sclerosis: Secondary | ICD-10-CM

## 2024-03-13 DIAGNOSIS — R2689 Other abnormalities of gait and mobility: Secondary | ICD-10-CM | POA: Diagnosis not present

## 2024-03-13 DIAGNOSIS — R252 Cramp and spasm: Secondary | ICD-10-CM

## 2024-03-13 NOTE — Therapy (Signed)
 OUTPATIENT PHYSICAL THERAPY NEURO TREATMENT   Patient Name: DEVANTE CAPANO MRN: [id number] DOB:12/14/1985, 38 y.o., male Today's Date: 03/13/2024   PCP: Vear Charlie LABOR, MD REFERRING PROVIDER: Cary No, NP  END OF SESSION:  PT End of Session - 03/13/24 0935     Visit Number 2    Number of Visits 17   16 plus Eval   Date for Recertification  05/19/24    PT Start Time 0932    PT Stop Time 1015    PT Time Calculation (min) 43 min    Equipment Utilized During Treatment Gait belt    Activity Tolerance Patient tolerated treatment well;Other (comment)   Intermittent spasms reported during session   Behavior During Therapy [facility] for tasks assessed/performed           Past Medical History:  Diagnosis Date   Depression    situaltional   Dyspnea    Multiple sclerosis 12/24/13   Multiple sclerosis    Vision abnormalities    Past Surgical History:  Procedure Laterality Date   NO PAST SURGERIES     Patient Active Problem List   Diagnosis Date Noted   Leukopenia 04/17/2022   Hyperphosphatemia 04/17/2022   Influenza B 04/14/2022   Acute left-sided weakness 04/14/2022   Left hemiparesis (HCC) 04/14/2022   Substance-induced anxiety disorder (HCC) 02/22/2022   Marijuana use 02/22/2022   MDD (major depressive disorder), recurrent episode, severe (HCC) 02/21/2022   Overdose of undetermined intent 02/21/2022   Urinary hesitancy 12/29/2021   Acute COVID-19 06/30/2021   COVID-19 virus infection 06/04/2020   High risk sexual behavior 12/22/2017   Immunosuppression 12/22/2017   Screening for human immunodeficiency virus 12/22/2017   High risk medication use 12/21/2016   Hepatitis B antibody positive 07/19/2016   Spasticity 06/17/2016   Spells 07/22/2015   Depression with anxiety 07/02/2015   Other fatigue 05/01/2015   Numbness 05/01/2015   Gait disturbance 05/01/2015   Disturbed cognition 05/01/2015   Erectile dysfunction 05/01/2015   Multiple sclerosis exacerbation  03/11/2015   Tobacco abuse 03/06/2015   Nausea without vomiting 11/08/2014   Depression due to multiple sclerosis 11/08/2014   Relapsing remitting multiple sclerosis 10/01/2014   Swelling of both hands 05/14/2014   Multiple sclerosis 12/24/2013    ONSET DATE: 03/07/2024 (Date of referral)  REFERRING DIAG: G35.A (ICD-10-CM) - Relapsing remitting multiple sclerosis R26.9 (ICD-10-CM) - Gait disturbance R25.2 (ICD-10-CM) - Spasticity  THERAPY DIAG:  Other abnormalities of gait and mobility  Relapsing remitting multiple sclerosis  Spasticity  Rationale for Evaluation and Treatment: Rehabilitation  SUBJECTIVE:  SUBJECTIVE STATEMENT:  Pt denies any acute changes since last visit. Pt with ongoing varying degrees of pain and varying locations (not specified), ongoing spasticity as well.  Pt accompanied by: self  PERTINENT HISTORY: Relapsing remitting MS.  Spasms L>R and legs>shoulders; worse at night.  On baclofen .  Per NP [place] note 03/07/24 pt referred for gait difficulty, frequent falls, and L shoulder pain.  PMH includes dyspnea, depression, MS (01/03/14), and vision abnormalities.  PAIN:  Are you having pain? Yes: NPRS scale: 3/10 (but gets up to 9/10 with spasms) Pain location: L>R shoulder/neck area to torso Pain description: sharp shooting and everything tightens up, spasms Aggravating factors: just walking or sitting Relieving factors: pain medications help  PRECAUTIONS: Fall; pt reports falling all the time (walks around home without cane and holds onto furniture--doesn't like to walk with cane in home for sanitary reasons and in general)  RED FLAGS: None   WEIGHT BEARING RESTRICTIONS: No  FALLS: Has patient fallen in last 6 months? Yes. Number of falls multiples falls a week  (2-3)  LIVING ENVIRONMENT: Lives with: lives alone Lives in: House/apartment Stairs: No Has following equipment at home: Single point cane and Walker - 2 wheeled  PLOF: Independent. Uses SPC.  Currently not working. (+) driving.  PATIENT GOALS: To make spasms go away.  Strengthen L leg.  Improve balance.  OBJECTIVE:  Note: Objective measures were completed at Evaluation unless otherwise noted.  COGNITION: Overall cognitive status: Within functional limits for tasks assessed   SENSATION: Decreased light touch L lateral thigh, medial/lateral lower L leg, and medial L foot  COORDINATION: Decreased L LE (appeared decreased L LE d/t impaired strength and tone)   MUSCLE TONE: Increased tone L LE   POSTURE: rounded shoulders, forward head, and posterior pelvic tilt  LOWER EXTREMITY ROM:     Active  Right Eval Left Eval  Hip flexion WNL At least 90 degrees (limited d/t increased tone)  Hip extension    Hip abduction    Hip adduction    Hip internal rotation    Hip external rotation    Knee flexion WNL WFL (limited d/t increased tone)  Knee extension WNL WNL  Ankle dorsiflexion WNL WFL  Ankle plantarflexion    Ankle inversion    Ankle eversion     (Blank rows = not tested)  LOWER EXTREMITY MMT:    MMT Right Eval Left Eval  Hip flexion 5/5 4+/5  Hip extension    Hip abduction    Hip adduction    Hip internal rotation    Hip external rotation    Knee flexion 5/5 3+/5  Knee extension 5/5 3+/5  Ankle dorsiflexion 5/5 2+/5  Ankle plantarflexion At least 3/5 AROM At least 2/5 AROM  Ankle inversion    Ankle eversion    (Blank rows = not tested)  TRANSFERS: Sit to stand: Modified independence  Assistive device utilized: None     Stand to sit: Modified independence  Assistive device utilized: None      GAIT: Findings: Gait Characteristics: L LE externally rotated; decreased L LE foot clearance and heel strike; increased R lateral lean during R LE stance phase; L  LE appearing stiff; decreased L UE arm swing, Distance walked: clinic distances, Assistive device utilized:Single point cane, Level of assistance: Modified independence, and Comments: Pt SBA ambulating without SPC during session.  FUNCTIONAL TESTS:  5 times sit to stand: 18.44 seconds; no UE support 10 meter walk test: 0.20 m/sec 1st trial and 0.37 m/sec  2nd trial (49.04 seconds 1st trial (limited d/t spasm middle of walking); 26.88 seconds 2nd trial; pt carrying SPC)  PATIENT SURVEYS:  ABC Scale: TBA                                                                                                                             TREATMENT DATE: 03/13/24   Self Care BP and HR taken in sitting at rest during at beginning of session (see below for details). Pt slightly hypotensive, no complaints of dizziness or lightheadedness during session with standing tasks.  Vitals:   03/13/24 0944  BP: (!) 113/58  Pulse: 65      TherAct/Physical Performance For initial goal assessment: ABC: 38.75% confidence FGA:  [facility] PT Assessment - 03/13/24 0942       Functional Gait  Assessment   Gait assessed  Yes    Gait Level Surface Walks 20 ft, slow speed, abnormal gait pattern, evidence for imbalance or deviates 10-15 in outside of the 12 in walkway width. Requires more than 7 sec to ambulate 20 ft.    Change in Gait Speed Cannot change speeds, deviates greater than 15 in outside 12 in walkway width, or loses balance and has to reach for wall or be caught.    Gait with Horizontal Head Turns Performs head turns with moderate changes in gait velocity, slows down, deviates 10-15 in outside 12 in walkway width but recovers, can continue to walk.    Gait with Vertical Head Turns Performs task with severe disruption of gait (eg, staggers 15 in outside 12 in walkway width, loses balance, stops, reaches for wall).    Gait and Pivot Turn Turns slowly, requires verbal cueing, or requires several small steps to  catch balance following turn and stop    Step Over Obstacle Is able to step over one shoe box (4.5 in total height) but must slow down and adjust steps to clear box safely. May require verbal cueing.    Gait with Narrow Base of Support Is able to ambulate for 10 steps heel to toe with no staggering.   with SPC   Gait with Eyes Closed Cannot walk 20 ft without assistance, severe gait deviations or imbalance, deviates greater than 15 in outside 12 in walkway width or will not attempt task.    Ambulating Backwards Walks 20 ft, slow speed, abnormal gait pattern, evidence for imbalance, deviates 10-15 in outside 12 in walkway width.    Steps Two feet to a stair, must use rail.    Total Score 8    FGA comment: 8/30, high fall risk         To further assess LLE impairments: Supine LLE PROM Pt noted to have significant tightness and spasticity in his hip flexors  To address LLE strength and ROM impairments: Supine L modified Thomas stretch 3 x 30 sec Supine heel slides x 10 reps   Added to HEP, see bolded below  Gait Gait pattern: decreased hip/knee flexion- Left, decreased ankle dorsiflexion- Left, circumduction- Left, genu recurvatum- Left, scissoring, and lateral lean- Right Distance walked: various clinic distances Assistive device utilized: Single point cane Level of assistance: Modified independence Comments: with intermittent LLE spasticity    PATIENT EDUCATION: Education details: results of OM and functional implications, initial HEP Person educated: Patient Education method: Explanation, Demonstration, Tactile cues, Verbal cues, and Handouts Education comprehension: verbalized understanding, returned demonstration, and needs further education  HOME EXERCISE PROGRAM: Access Code: [email or web address] URL: [email or web address] Date: 03/13/2024 Prepared by: Waddell Southgate  Exercises - Modified Thomas Stretch  - 1 x daily - 7 x weekly - 1 sets - 3-5 reps - 30 sec  hold - Supine Heel Slide  - 1 x daily - 7 x weekly - 3 sets - 10 reps  GOALS: Goals reviewed with patient? Yes  SHORT TERM GOALS: Target date: 04/07/2024  Pt will be independent with initial HEP in order to improve strength and balance in order to decrease fall risk and improve function at home for ADL's. Baseline: TBA Goal status: INITIAL  2.  Pt will improve FGA to 12/30 for decreased fall risk  Baseline: 8/30 (10/27) Goal status: INITIAL  3.  Patient will tolerate 5 seconds of single leg stance B LE's without loss of balance to improve ability to get in and out of shower safely. Baseline: TBA Goal status: INITIAL  LONG TERM GOALS: Target date: 05/05/2024  Pt will be independent with final HEP in order to improve strength and balance in order to decrease fall risk and improve function at home for ADL's. Baseline: TBA Goal status: INITIAL  2.  Pt will decrease 5 Time Sit to Stand by at least 3 seconds in order to demonstrate clinically significant improvement in LE strength. Baseline: 18.44 seconds (Eval) Goal status: INITIAL  3.  Pt will increase by at least 0.13 m/s in order to demonstrate clinically significant improvement in community ambulation.  Baseline: 0.20 m/sec 1st trial and 0.37 m/sec 2nd trial Goal status: INITIAL  4.  Pt will improve FGA to 16/30 for decreased fall risk  Baseline: 8/30 (10/27) Goal status: REVISED  5.  Patient will deny any falls over past 4 weeks to demonstrate improved balance at home and in community. Baseline: Multiple falls a week. Goal status: INITIAL  6.  Patient will report a worst pain of 3/10 on VAS in order to improve tolerance with ADLs and reduced symptoms with activities.  Baseline: 3/10 (but gets up to 9/10 with spasms)  Goal status: INITIAL   ASSESSMENT:  CLINICAL IMPRESSION: Emphasis of skilled PT session on assessing ABC and FGA per his POC as well as initiating his HEP. He is a very high fall risk based on his  observable gait impairments and his score of 8/30 on the FGA. Additionally, he is only 38.75% confident in his balance based on his ABC score. His LLE muscle weakness and ongoing spasticity remains a limiting factor in his safety with mobility and increases his fall risk. Added to his HEP to address L hip flexor tightness and HS weakness. He continues to benefit from skilled PT services to work towards increased safety and independence with functional mobility and decreased fall risk. Continue POC.   OBJECTIVE IMPAIRMENTS: Abnormal gait, decreased activity tolerance, decreased balance, decreased coordination, decreased endurance, decreased knowledge of condition, decreased knowledge of use of DME, decreased mobility, difficulty walking, decreased ROM, decreased strength, decreased safety awareness, increased muscle spasms, impaired flexibility, impaired sensation,  impaired tone, improper body mechanics, postural dysfunction, and pain.   ACTIVITY LIMITATIONS: carrying, lifting, bending, sitting, standing, squatting, stairs, transfers, bathing, toileting, dressing, and locomotion level  PARTICIPATION LIMITATIONS: meal prep, cleaning, laundry, shopping, community activity, and occupation  PERSONAL FACTORS: Past/current experiences, Time since onset of injury/illness/exacerbation, and 3+ comorbidities: dyspnea, MS, and vision abnormalities are also affecting patient's functional outcome.   REHAB POTENTIAL: Fair d/t relapsing remitting multiple sclerosis   CLINICAL DECISION MAKING: Evolving/moderate complexity  EVALUATION COMPLEXITY: Moderate  PLAN:  PT FREQUENCY: 2x/week  PT DURATION: 8 weeks  PLANNED INTERVENTIONS: [id number]- PT Re-evaluation, 97750- Physical Performance Testing, 97110-Therapeutic exercises, 97530- Therapeutic activity, [id number]- Neuromuscular re-education, 97535- Self Care, [id number]- Manual therapy, [id number]- Gait training, [phone]- Orthotic Initial, [id number]- Orthotic/Prosthetic subsequent,  [id number]- Aquatic Therapy, [phone]- Electrical stimulation (manual), [id number]- Ultrasound, 79439 (1-2 muscles), [id number] (3+ muscles)- Dry Needling, Patient/Family education, Balance training, Stair training, Taping, Joint mobilization, Spinal mobilization, Visual/preceptual remediation/compensation, DME instructions, Cryotherapy, and Moist heat  PLAN FOR NEXT SESSION: how is initial HEP? Add to HEP to address LE strengthening; stretching; pain control; balance; Bioness?, L quad strengthening, increased LLE WB with sit to stands, quadruped (depending on L shoulder pain)?   Waddell Southgate, PT Waddell Southgate, PT, DPT, CSRS  03/13/2024, 10:21 AM

## 2024-03-16 ENCOUNTER — Encounter: Payer: Self-pay | Admitting: Physical Therapy

## 2024-03-16 ENCOUNTER — Ambulatory Visit: Admitting: Physical Therapy

## 2024-03-16 VITALS — BP 102/67 | HR 70

## 2024-03-16 DIAGNOSIS — R2689 Other abnormalities of gait and mobility: Secondary | ICD-10-CM

## 2024-03-16 DIAGNOSIS — R252 Cramp and spasm: Secondary | ICD-10-CM

## 2024-03-16 DIAGNOSIS — G35A Relapsing-remitting multiple sclerosis: Secondary | ICD-10-CM

## 2024-03-16 NOTE — Therapy (Signed)
 OUTPATIENT PHYSICAL THERAPY NEURO TREATMENT   Patient Name: Mike Lowery MRN: [id number] DOB:1985-08-24, 38 y.o., male Today's Date: 03/16/2024   PCP: Vear Charlie LABOR, MD REFERRING PROVIDER: Cary No, NP  END OF SESSION:  PT End of Session - 03/16/24 1018     Visit Number 3    Number of Visits 17   16 plus Eval   Date for Recertification  05/19/24    PT Start Time 1013    PT Stop Time 1102    PT Time Calculation (min) 49 min    Equipment Utilized During Treatment Gait belt    Activity Tolerance Patient tolerated treatment well;Other (comment)   Intermittent spasms reported during session   Behavior During Therapy [facility] for tasks assessed/performed           Past Medical History:  Diagnosis Date   Depression    situaltional   Dyspnea    Multiple sclerosis 12/24/13   Multiple sclerosis    Vision abnormalities    Past Surgical History:  Procedure Laterality Date   NO PAST SURGERIES     Patient Active Problem List   Diagnosis Date Noted   Leukopenia 04/17/2022   Hyperphosphatemia 04/17/2022   Influenza B 04/14/2022   Acute left-sided weakness 04/14/2022   Left hemiparesis (HCC) 04/14/2022   Substance-induced anxiety disorder (HCC) 02/22/2022   Marijuana use 02/22/2022   MDD (major depressive disorder), recurrent episode, severe (HCC) 02/21/2022   Overdose of undetermined intent 02/21/2022   Urinary hesitancy 12/29/2021   Acute COVID-19 06/30/2021   COVID-19 virus infection 06/04/2020   High risk sexual behavior 12/22/2017   Immunosuppression 12/22/2017   Screening for human immunodeficiency virus 12/22/2017   High risk medication use 12/21/2016   Hepatitis B antibody positive 07/19/2016   Spasticity 06/17/2016   Spells 07/22/2015   Depression with anxiety 07/02/2015   Other fatigue 05/01/2015   Numbness 05/01/2015   Gait disturbance 05/01/2015   Disturbed cognition 05/01/2015   Erectile dysfunction 05/01/2015   Multiple sclerosis exacerbation  03/11/2015   Tobacco abuse 03/06/2015   Nausea without vomiting 11/08/2014   Depression due to multiple sclerosis 11/08/2014   Relapsing remitting multiple sclerosis 10/01/2014   Swelling of both hands 05/14/2014   Multiple sclerosis 12/24/2013    ONSET DATE: 03/07/2024 (Date of referral)  REFERRING DIAG: G35.A (ICD-10-CM) - Relapsing remitting multiple sclerosis R26.9 (ICD-10-CM) - Gait disturbance R25.2 (ICD-10-CM) - Spasticity  THERAPY DIAG:  Other abnormalities of gait and mobility  Relapsing remitting multiple sclerosis  Spasticity  Rationale for Evaluation and Treatment: Rehabilitation  SUBJECTIVE:  SUBJECTIVE STATEMENT: Pt reports no falls since last therapy visit.  Pt arrived ambulating with SPC.  Pt with significant L sided muscle spasms (pt significantly shaking) upon walking into therapy area requiring assist for balance/safety and to sit in chair for safety.  Spasms resolved and pt requesting to walk into clinic (1 assist and chair follow provided for safety).  Pt reports this is a common daily occurrence and he just stops until spasms resolves and then keeps walking. Pt accompanied by: self  PERTINENT HISTORY: Relapsing remitting MS.  Spasms L>R and legs>shoulders; worse at night.  On baclofen .  Per NP [place] note 03/07/24 pt referred for gait difficulty, frequent falls, and L shoulder pain.  PMH includes dyspnea, depression, MS (01/03/14), and vision abnormalities.  PAIN:  Are you having pain? Yes: NPRS scale: 5-6/10 (but gets up to 9/10 with spasms) Pain location: L>R shoulder/neck area to torso Pain description: sharp shooting and everything tightens up, spasms Aggravating factors: just walking or sitting Relieving factors: pain medications help  PRECAUTIONS: Fall; pt reports  falling all the time (walks around home without cane and holds onto furniture--doesn't like to walk with cane in home for sanitary reasons and in general)  RED FLAGS: None   WEIGHT BEARING RESTRICTIONS: No  FALLS: Has patient fallen in last 6 months? Yes. Number of falls multiples falls a week (2-3)  LIVING ENVIRONMENT: Lives with: lives alone Lives in: House/apartment Stairs: No Has following equipment at home: Single point cane and Walker - 2 wheeled  PLOF: Independent. Uses SPC.  Currently not working. (+) driving.  PATIENT GOALS: To make spasms go away.  Strengthen L leg.  Improve balance.  OBJECTIVE:  Note: Objective measures were completed at Evaluation unless otherwise noted.  COGNITION: Overall cognitive status: Within functional limits for tasks assessed   SENSATION: Decreased light touch L lateral thigh, medial/lateral lower L leg, and medial L foot  COORDINATION: Decreased L LE (appeared decreased L LE d/t impaired strength and tone)   MUSCLE TONE: Increased tone L LE   POSTURE: rounded shoulders, forward head, and posterior pelvic tilt  LOWER EXTREMITY ROM:     Active  Right Eval Left Eval  Hip flexion WNL At least 90 degrees (limited d/t increased tone)  Hip extension    Hip abduction    Hip adduction    Hip internal rotation    Hip external rotation    Knee flexion WNL WFL (limited d/t increased tone)  Knee extension WNL WNL  Ankle dorsiflexion WNL WFL  Ankle plantarflexion    Ankle inversion    Ankle eversion     (Blank rows = not tested)  LOWER EXTREMITY MMT:    MMT Right Eval Left Eval  Hip flexion 5/5 4+/5  Hip extension    Hip abduction    Hip adduction    Hip internal rotation    Hip external rotation    Knee flexion 5/5 3+/5  Knee extension 5/5 3+/5  Ankle dorsiflexion 5/5 2+/5  Ankle plantarflexion At least 3/5 AROM At least 2/5 AROM  Ankle inversion    Ankle eversion    (Blank rows = not tested)  TRANSFERS: Sit to  stand: Modified independence  Assistive device utilized: None     Stand to sit: Modified independence  Assistive device utilized: None      GAIT: Findings: Gait Characteristics: L LE externally rotated; decreased L LE foot clearance and heel strike; increased R lateral lean during R LE stance phase; L LE appearing stiff;  decreased L UE arm swing, Distance walked: clinic distances, Assistive device utilized:Single point cane, Level of assistance: Modified independence, and Comments: Pt SBA ambulating without SPC during session.  FUNCTIONAL TESTS:  5 times sit to stand: 18.44 seconds; no UE support 10 meter walk test: 0.20 m/sec 1st trial and 0.37 m/sec 2nd trial (49.04 seconds 1st trial (limited d/t spasm middle of walking); 26.88 seconds 2nd trial; pt carrying SPC)  PATIENT SURVEYS:  ABC Scale: TBA                                                                                                                             TREATMENT DATE: 03/16/24  Self Care: BP and HR taken in sitting at rest beginning of session (see below for details).  No dizziness or lightheadedness reported today. Vitals:   03/16/24 1023  BP: 102/67  Pulse: 70   Therapeutic Exercise: Supine L hip flexor stretching off side of mat table: 3x1 minute; pt reports stretch felt good Supine L hip flexor strengthening: L leg off mat table and then back onto mat table; x10 reps; CGA for safety; 2 spasms L LE during exercise requiring brief rest break; 8/10 RPE reported post activity Supine bridges: x10 reps with assist to keep knees in midline position and even pelvis (L pelvis lower than R side); 2x10 reps with pillow between knees (improved pelvic positioning noted); vc's for even weight through B LE's and equal muscle activation side to side  Therapeutic Activity: Sit to stands from mat table: x5 reps x2 sets; B UE support; focusing on improving WB'ing through L LE; vc's and tactile cueing for technique   PATIENT  EDUCATION: Education details: HEP and addition of bridges to HEP (see below for details); fall prevention and safety Person educated: Patient Education method: Explanation, Demonstration, Tactile cues, Verbal cues, and Handouts Education comprehension: verbalized understanding, returned demonstration, and needs further education  HOME EXERCISE PROGRAM: Access Code: [email or web address] URL: [email or web address] Date: 03/16/2024 Prepared by: Damien Caulk  Exercises - Modified Thomas Stretch  - 1 x daily - 7 x weekly - 1 sets - 3-5 reps - 30 sec hold - Supine Heel Slide  - 1 x daily - 7 x weekly - 3 sets - 10 reps - Supine Bridge  - 1 x daily - 5 x weekly - 2-3 sets - 10 reps  GOALS: Goals reviewed with patient? Yes  SHORT TERM GOALS: Target date: 04/07/2024  Pt will be independent with initial HEP in order to improve strength and balance in order to decrease fall risk and improve function at home for ADL's. Baseline: TBA Goal status: INITIAL  2.  Pt will improve FGA to 12/30 for decreased fall risk  Baseline: 8/30 (10/27) Goal status: INITIAL  3.  Patient will tolerate 5 seconds of single leg stance B LE's without loss of balance to improve ability to get in and out of shower safely. Baseline: TBA Goal status: INITIAL  LONG TERM GOALS:  Target date: 05/05/2024  Pt will be independent with final HEP in order to improve strength and balance in order to decrease fall risk and improve function at home for ADL's. Baseline: TBA Goal status: INITIAL  2.  Pt will decrease 5 Time Sit to Stand by at least 3 seconds in order to demonstrate clinically significant improvement in LE strength. Baseline: 18.44 seconds (Eval) Goal status: INITIAL  3.  Pt will increase by at least 0.13 m/s in order to demonstrate clinically significant improvement in community ambulation.  Baseline: 0.20 m/sec 1st trial and 0.37 m/sec 2nd trial Goal status: INITIAL  4.  Pt will improve FGA to  16/30 for decreased fall risk  Baseline: 8/30 (10/27) Goal status: REVISED  5.  Patient will deny any falls over past 4 weeks to demonstrate improved balance at home and in community. Baseline: Multiple falls a week. Goal status: INITIAL  6.  Patient will report a worst pain of 3/10 on VAS in order to improve tolerance with ADLs and reduced symptoms with activities.  Baseline: 3/10 (but gets up to 9/10 with spasms)  Goal status: INITIAL   ASSESSMENT:  CLINICAL IMPRESSION: Patient was seen today for physical therapy treatment to address ROM, strengthening, and transfer technique.  Focused session on L hip flexor stretching (pt reported it felt good), L hip flexor strengthening, bridging to improve hip and pelvic stabilization, and sit to stand transfers focusing on increasing L LE WB'ing with activity.  Patient continues to be limited by L hip flexor tightness and L LE weakness.  They demonstrate improvement in L LE WB'ing with cueing and repetition of sit to stand transfers (pt reports he did not know he was relying on R LE to stand).  They would continue to benefit from skilled PT to address impairments as noted and progress towards long term goals.  D/t muscle spasms with walking, pt brought out to car via clinic w/c for safety with pt's permission (pt reports no issues with driving).  OBJECTIVE IMPAIRMENTS: Abnormal gait, decreased activity tolerance, decreased balance, decreased coordination, decreased endurance, decreased knowledge of condition, decreased knowledge of use of DME, decreased mobility, difficulty walking, decreased ROM, decreased strength, decreased safety awareness, increased muscle spasms, impaired flexibility, impaired sensation, impaired tone, improper body mechanics, postural dysfunction, and pain.   ACTIVITY LIMITATIONS: carrying, lifting, bending, sitting, standing, squatting, stairs, transfers, bathing, toileting, dressing, and locomotion level  PARTICIPATION  LIMITATIONS: meal prep, cleaning, laundry, shopping, community activity, and occupation  PERSONAL FACTORS: Past/current experiences, Time since onset of injury/illness/exacerbation, and 3+ comorbidities: dyspnea, MS, and vision abnormalities are also affecting patient's functional outcome.   REHAB POTENTIAL: Fair d/t relapsing remitting multiple sclerosis   CLINICAL DECISION MAKING: Evolving/moderate complexity  EVALUATION COMPLEXITY: Moderate  PLAN:  PT FREQUENCY: 2x/week  PT DURATION: 8 weeks  PLANNED INTERVENTIONS: [id number]- PT Re-evaluation, 97750- Physical Performance Testing, 97110-Therapeutic exercises, 97530- Therapeutic activity, [id number]- Neuromuscular re-education, 97535- Self Care, [id number]- Manual therapy, [id number]- Gait training, [phone]- Orthotic Initial, [phone]- Orthotic/Prosthetic subsequent, [phone]- Aquatic Therapy, [phone]- Electrical stimulation (manual), [id number]- Ultrasound, [id number] (1-2 muscles), [id number] (3+ muscles)- Dry Needling, Patient/Family education, Balance training, Stair training, Taping, Joint mobilization, Spinal mobilization, Visual/preceptual remediation/compensation, DME instructions, Cryotherapy, and Moist heat  PLAN FOR NEXT SESSION: how is HEP going? Add to HEP to address LE strengthening; stretching; pain control; balance; Bioness?, L quad strengthening, increased LLE WB with sit to stands, quadruped (depending on L shoulder pain)?   Damien Caulk, PT 03/16/2024, 7:23 PM

## 2024-03-20 ENCOUNTER — Ambulatory Visit: Admitting: Physical Therapy

## 2024-03-22 ENCOUNTER — Other Ambulatory Visit: Payer: Self-pay | Admitting: Neurology

## 2024-03-23 ENCOUNTER — Ambulatory Visit: Attending: Family Medicine | Admitting: Physical Therapy

## 2024-03-23 VITALS — BP 117/65 | HR 64

## 2024-03-23 DIAGNOSIS — R252 Cramp and spasm: Secondary | ICD-10-CM | POA: Insufficient documentation

## 2024-03-23 DIAGNOSIS — R2689 Other abnormalities of gait and mobility: Secondary | ICD-10-CM | POA: Insufficient documentation

## 2024-03-23 DIAGNOSIS — G35A Relapsing-remitting multiple sclerosis: Secondary | ICD-10-CM | POA: Insufficient documentation

## 2024-03-23 MED ORDER — GABAPENTIN 600 MG PO TABS: ORAL_TABLET | ORAL | 1 refills | Status: AC

## 2024-03-23 NOTE — Therapy (Signed)
 OUTPATIENT PHYSICAL THERAPY NEURO TREATMENT   Patient Name: Mike Lowery MRN: [id number] DOB:1985-06-22, 38 y.o., male Today's Date: 03/23/2024   PCP: Vear Charlie LABOR, MD REFERRING PROVIDER: Cary No, NP  END OF SESSION:  PT End of Session - 03/23/24 0853     Visit Number 4    Number of Visits 17   16 plus Eval   Date for Recertification  05/19/24    PT Start Time 0850   pt arrived late   PT Stop Time 0928    PT Time Calculation (min) 38 min    Equipment Utilized During Treatment Gait belt    Activity Tolerance Patient tolerated treatment well;Other (comment)   Intermittent spasms reported during session   Behavior During Therapy [facility] for tasks assessed/performed            Past Medical History:  Diagnosis Date   Depression    situaltional   Dyspnea    Multiple sclerosis 12/24/13   Multiple sclerosis    Vision abnormalities    Past Surgical History:  Procedure Laterality Date   NO PAST SURGERIES     Patient Active Problem List   Diagnosis Date Noted   Leukopenia 04/17/2022   Hyperphosphatemia 04/17/2022   Influenza B 04/14/2022   Acute left-sided weakness 04/14/2022   Left hemiparesis (HCC) 04/14/2022   Substance-induced anxiety disorder (HCC) 02/22/2022   Marijuana use 02/22/2022   MDD (major depressive disorder), recurrent episode, severe (HCC) 02/21/2022   Overdose of undetermined intent 02/21/2022   Urinary hesitancy 12/29/2021   Acute COVID-19 06/30/2021   COVID-19 virus infection 06/04/2020   High risk sexual behavior 12/22/2017   Immunosuppression 12/22/2017   Screening for human immunodeficiency virus 12/22/2017   High risk medication use 12/21/2016   Hepatitis B antibody positive 07/19/2016   Spasticity 06/17/2016   Spells 07/22/2015   Depression with anxiety 07/02/2015   Other fatigue 05/01/2015   Numbness 05/01/2015   Gait disturbance 05/01/2015   Disturbed cognition 05/01/2015   Erectile dysfunction 05/01/2015   Multiple sclerosis  exacerbation 03/11/2015   Tobacco abuse 03/06/2015   Nausea without vomiting 11/08/2014   Depression due to multiple sclerosis 11/08/2014   Relapsing remitting multiple sclerosis 10/01/2014   Swelling of both hands 05/14/2014   Multiple sclerosis 12/24/2013    ONSET DATE: 03/07/2024 (Date of referral)  REFERRING DIAG: G35.A (ICD-10-CM) - Relapsing remitting multiple sclerosis R26.9 (ICD-10-CM) - Gait disturbance R25.2 (ICD-10-CM) - Spasticity  THERAPY DIAG:  Other abnormalities of gait and mobility  Relapsing remitting multiple sclerosis  Spasticity  Rationale for Evaluation and Treatment: Rehabilitation  SUBJECTIVE:  SUBJECTIVE STATEMENT:  Pt denies any acute changes since last visit, no falls. His spasms vary day to day. Pt denies any questions over his HEP.  Pt enjoyed working out at [employer], has stopped in the last 4-5 months as he would get spasms with every set and when he tried to sleep at night his spasms were overwhelming. His neurologist suggested PT to work on stretching for spasticity management. Pt does take baclofen  for spasticity management.  Pt accompanied by: self  PERTINENT HISTORY: Relapsing remitting MS.  Spasms L>R and legs>shoulders; worse at night.  On baclofen .  Per NP [place] note 03/07/24 pt referred for gait difficulty, frequent falls, and L shoulder pain.  PMH includes dyspnea, depression, MS (01/03/14), and vision abnormalities.  PAIN:  Are you having pain? Yes: NPRS scale: 6-7/10 (but gets up to 9/10 with spasms) Pain location: L>R shoulder/neck area to torso Pain description: sharp shooting and everything tightens up, spasms Aggravating factors: just walking or sitting Relieving factors: pain medications help  PRECAUTIONS: Fall; pt reports falling all the time  (walks around home without cane and holds onto furniture--doesn't like to walk with cane in home for sanitary reasons and in general)  RED FLAGS: None   WEIGHT BEARING RESTRICTIONS: No  FALLS: Has patient fallen in last 6 months? Yes. Number of falls multiples falls a week (2-3)  LIVING ENVIRONMENT: Lives with: lives alone Lives in: House/apartment Stairs: No Has following equipment at home: Single point cane and Walker - 2 wheeled  PLOF: Independent. Uses SPC.  Currently not working. (+) driving.  PATIENT GOALS: To make spasms go away.  Strengthen L leg.  Improve balance.  OBJECTIVE:  Note: Objective measures were completed at Evaluation unless otherwise noted.  COGNITION: Overall cognitive status: Within functional limits for tasks assessed   SENSATION: Decreased light touch L lateral thigh, medial/lateral lower L leg, and medial L foot  COORDINATION: Decreased L LE (appeared decreased L LE d/t impaired strength and tone)   MUSCLE TONE: Increased tone L LE   POSTURE: rounded shoulders, forward head, and posterior pelvic tilt  LOWER EXTREMITY ROM:     Active  Right Eval Left Eval  Hip flexion WNL At least 90 degrees (limited d/t increased tone)  Hip extension    Hip abduction    Hip adduction    Hip internal rotation    Hip external rotation    Knee flexion WNL WFL (limited d/t increased tone)  Knee extension WNL WNL  Ankle dorsiflexion WNL WFL  Ankle plantarflexion    Ankle inversion    Ankle eversion     (Blank rows = not tested)  LOWER EXTREMITY MMT:    MMT Right Eval Left Eval  Hip flexion 5/5 4+/5  Hip extension    Hip abduction    Hip adduction    Hip internal rotation    Hip external rotation    Knee flexion 5/5 3+/5  Knee extension 5/5 3+/5  Ankle dorsiflexion 5/5 2+/5  Ankle plantarflexion At least 3/5 AROM At least 2/5 AROM  Ankle inversion    Ankle eversion    (Blank rows = not tested)  TRANSFERS: Sit to stand: Modified  independence  Assistive device utilized: None     Stand to sit: Modified independence  Assistive device utilized: None      GAIT: Findings: Gait Characteristics: L LE externally rotated; decreased L LE foot clearance and heel strike; increased R lateral lean during R LE stance phase; L LE appearing stiff; decreased  L UE arm swing, Distance walked: clinic distances, Assistive device utilized:Single point cane, Level of assistance: Modified independence, and Comments: Pt SBA ambulating without SPC during session.  FUNCTIONAL TESTS:  5 times sit to stand: 18.44 seconds; no UE support 10 meter walk test: 0.20 m/sec 1st trial and 0.37 m/sec 2nd trial (49.04 seconds 1st trial (limited d/t spasm middle of walking); 26.88 seconds 2nd trial; pt carrying SPC)  PATIENT SURVEYS:  ABC Scale: TBA                                                                                                                             TREATMENT DATE: 03/23/24  Self Care: BP and HR taken in sitting at rest beginning of session (see below for details).  No dizziness or lightheadedness reported today. Vitals:   03/23/24 0856  BP: 117/65  Pulse: 64    Therapeutic Activity: To work on functional strengthening and increased WB through LLE: Staggered sit to stands x 10 reps Added in 2 step under RLE x 10 reps Added in 6 step under RLE x 10 reps More of a challenge for balance To address muscle tightness and spasticity in cervical region: Seated LUT stretch 3 x 30 sec Seated L levator scap stretch 3 x 30 sec Seated L SCM stretch 3 x 30 sec Discussed LE stretches for spasticity management: Supine modified Thomas stretch (prescribed last visit) Seated HS stretch Seated gastroc stretch with strap To work on return to gym and closed-chain strengthening exercises: BLE leg press with 100# x 10 reps LLE leg press with 60# x 10 reps Focus on eccentric control and not hyperextending knee   PATIENT  EDUCATION: Education details: added to HEP, discussed return to gym and closed chain vs open chain exercises for spasticity management - will discuss further in future sessions Person educated: Patient Education method: Explanation, Demonstration, Tactile cues, and Verbal cues Education comprehension: verbalized understanding, returned demonstration, and needs further education  HOME EXERCISE PROGRAM: Access Code: [email or web address] URL: [email or web address] Date: 03/16/2024 Prepared by: Damien Caulk  Exercises - Modified Thomas Stretch  - 1 x daily - 7 x weekly - 1 sets - 3-5 reps - 30 sec hold - Supine Heel Slide  - 1 x daily - 7 x weekly - 3 sets - 10 reps - Supine Bridge  - 1 x daily - 5 x weekly - 2-3 sets - 10 reps - Seated Upper Trapezius Stretch  - 1 x daily - 5 x weekly - 1 sets - 3-5 reps - 30 sec hold - Seated Levator Scapulae Stretch  - 1 x daily - 5 x weekly - 1 sets - 3-5 reps - 30 sec hold - Sternocleidomastoid Stretch  - 1 x daily - 5 x weekly - 1 sets - 3-5 reps - 30 sec hold  GOALS: Goals reviewed with patient? Yes  SHORT TERM GOALS: Target date: 04/07/2024  Pt will be independent with initial HEP in order to  improve strength and balance in order to decrease fall risk and improve function at home for ADL's. Baseline: TBA Goal status: INITIAL  2.  Pt will improve FGA to 12/30 for decreased fall risk  Baseline: 8/30 (10/27) Goal status: INITIAL  3.  Patient will tolerate 5 seconds of single leg stance B LE's without loss of balance to improve ability to get in and out of shower safely. Baseline: TBA Goal status: INITIAL  LONG TERM GOALS: Target date: 05/05/2024  Pt will be independent with final HEP in order to improve strength and balance in order to decrease fall risk and improve function at home for ADL's. Baseline: TBA Goal status: INITIAL  2.  Pt will decrease 5 Time Sit to Stand by at least 3 seconds in order to demonstrate clinically  significant improvement in LE strength. Baseline: 18.44 seconds (Eval) Goal status: INITIAL  3.  Pt will increase by at least 0.13 m/s in order to demonstrate clinically significant improvement in community ambulation.  Baseline: 0.20 m/sec 1st trial and 0.37 m/sec 2nd trial Goal status: INITIAL  4.  Pt will improve FGA to 16/30 for decreased fall risk  Baseline: 8/30 (10/27) Goal status: REVISED  5.  Patient will deny any falls over past 4 weeks to demonstrate improved balance at home and in community. Baseline: Multiple falls a week. Goal status: INITIAL  6.  Patient will report a worst pain of 3/10 on VAS in order to improve tolerance with ADLs and reduced symptoms with activities.  Baseline: 3/10 (but gets up to 9/10 with spasms)  Goal status: INITIAL   ASSESSMENT:  CLINICAL IMPRESSION: Emphasis of skilled PT session on working on increased WB through LLE and functional strengthening for spasticity management as well as reviewing stretches that pt can be performing and working on for L hemibody spasticity management. Pt's goal is to be able to return to the gym and reduce his spasms. He exhibits good LLE control with staggered sit to stands and with leg press and demos good understanding of stretches he should continue working on. Plan to review further closed chain exercises next visit. Continue POC.   OBJECTIVE IMPAIRMENTS: Abnormal gait, decreased activity tolerance, decreased balance, decreased coordination, decreased endurance, decreased knowledge of condition, decreased knowledge of use of DME, decreased mobility, difficulty walking, decreased ROM, decreased strength, decreased safety awareness, increased muscle spasms, impaired flexibility, impaired sensation, impaired tone, improper body mechanics, postural dysfunction, and pain.   ACTIVITY LIMITATIONS: carrying, lifting, bending, sitting, standing, squatting, stairs, transfers, bathing, toileting, dressing, and  locomotion level  PARTICIPATION LIMITATIONS: meal prep, cleaning, laundry, shopping, community activity, and occupation  PERSONAL FACTORS: Past/current experiences, Time since onset of injury/illness/exacerbation, and 3+ comorbidities: dyspnea, MS, and vision abnormalities are also affecting patient's functional outcome.   REHAB POTENTIAL: Fair d/t relapsing remitting multiple sclerosis   CLINICAL DECISION MAKING: Evolving/moderate complexity  EVALUATION COMPLEXITY: Moderate  PLAN:  PT FREQUENCY: 2x/week  PT DURATION: 8 weeks  PLANNED INTERVENTIONS: [id number]- PT Re-evaluation, 97750- Physical Performance Testing, 97110-Therapeutic exercises, 97530- Therapeutic activity, [id number]- Neuromuscular re-education, 97535- Self Care, [id number]- Manual therapy, [id number]- Gait training, [phone]- Orthotic Initial, [phone]- Orthotic/Prosthetic subsequent, [phone]- Aquatic Therapy, [phone]- Electrical stimulation (manual), [id number]- Ultrasound, [id number] (1-2 muscles), [id number] (3+ muscles)- Dry Needling, Patient/Family education, Balance training, Stair training, Taping, Joint mobilization, Spinal mobilization, Visual/preceptual remediation/compensation, DME instructions, Cryotherapy, and Moist heat  PLAN FOR NEXT SESSION: how is HEP going? Add to HEP to address LE strengthening; stretching; pain control; balance; Bioness?, L quad strengthening, increased  LLE WB with sit to stands, quadruped (depending on L shoulder pain)? leg press, closed chain strengthening exercises, half-kneel, lunges   Waddell Southgate, PT Waddell Southgate, PT, DPT, CSRS  03/23/2024, 9:28 AM

## 2024-03-23 NOTE — Telephone Encounter (Signed)
 Pt is needing his baclofen  (LIORESAL ) 20 MG tablet and his gabapentin  (NEURONTIN ) 600 MG tablet called in to the [facility] on [address].

## 2024-03-27 ENCOUNTER — Ambulatory Visit: Admitting: Physical Therapy

## 2024-03-27 NOTE — Therapy (Incomplete)
 OUTPATIENT PHYSICAL THERAPY NEURO TREATMENT   Patient Name: Mike Lowery MRN: [id number] DOB:04-Nov-1985, 38 y.o., male Today's Date: 03/27/2024   PCP: Vear Charlie LABOR, MD REFERRING PROVIDER: Cary No, NP  END OF SESSION:      Past Medical History:  Diagnosis Date   Depression    situaltional   Dyspnea    Multiple sclerosis 12/24/13   Multiple sclerosis    Vision abnormalities    Past Surgical History:  Procedure Laterality Date   NO PAST SURGERIES     Patient Active Problem List   Diagnosis Date Noted   Leukopenia 04/17/2022   Hyperphosphatemia 04/17/2022   Influenza B 04/14/2022   Acute left-sided weakness 04/14/2022   Left hemiparesis (HCC) 04/14/2022   Substance-induced anxiety disorder (HCC) 02/22/2022   Marijuana use 02/22/2022   MDD (major depressive disorder), recurrent episode, severe (HCC) 02/21/2022   Overdose of undetermined intent 02/21/2022   Urinary hesitancy 12/29/2021   Acute COVID-19 06/30/2021   COVID-19 virus infection 06/04/2020   High risk sexual behavior 12/22/2017   Immunosuppression 12/22/2017   Screening for human immunodeficiency virus 12/22/2017   High risk medication use 12/21/2016   Hepatitis B antibody positive 07/19/2016   Spasticity 06/17/2016   Spells 07/22/2015   Depression with anxiety 07/02/2015   Other fatigue 05/01/2015   Numbness 05/01/2015   Gait disturbance 05/01/2015   Disturbed cognition 05/01/2015   Erectile dysfunction 05/01/2015   Multiple sclerosis exacerbation 03/11/2015   Tobacco abuse 03/06/2015   Nausea without vomiting 11/08/2014   Depression due to multiple sclerosis 11/08/2014   Relapsing remitting multiple sclerosis 10/01/2014   Swelling of both hands 05/14/2014   Multiple sclerosis 12/24/2013    ONSET DATE: 03/07/2024 (Date of referral)  REFERRING DIAG: G35.A (ICD-10-CM) - Relapsing remitting multiple sclerosis R26.9 (ICD-10-CM) - Gait disturbance R25.2 (ICD-10-CM) - Spasticity  THERAPY  DIAG:  No diagnosis found.  Rationale for Evaluation and Treatment: Rehabilitation  SUBJECTIVE:                                                                                                                                                                                             SUBJECTIVE STATEMENT:  Pt denies any acute changes since last visit, no falls. His spasms vary day to day. Pt denies any questions over his HEP.  Pt enjoyed working out at [employer], has stopped in the last 4-5 months as he would get spasms with every set and when he tried to sleep at night his spasms were overwhelming. His neurologist suggested PT to work on stretching for spasticity management. Pt does take baclofen  for spasticity management.  ***  Pt accompanied by: self  PERTINENT HISTORY: Relapsing remitting MS.  Spasms L>R and legs>shoulders; worse at night.  On baclofen .  Per NP [place] note 03/07/24 pt referred for gait difficulty, frequent falls, and L shoulder pain.  PMH includes dyspnea, depression, MS (01/03/14), and vision abnormalities.  PAIN:  Are you having pain? Yes: NPRS scale: 6-7/10 (but gets up to 9/10 with spasms) Pain location: L>R shoulder/neck area to torso Pain description: sharp shooting and everything tightens up, spasms Aggravating factors: just walking or sitting Relieving factors: pain medications help  PRECAUTIONS: Fall; pt reports falling all the time (walks around home without cane and holds onto furniture--doesn't like to walk with cane in home for sanitary reasons and in general)  RED FLAGS: None   WEIGHT BEARING RESTRICTIONS: No  FALLS: Has patient fallen in last 6 months? Yes. Number of falls multiples falls a week (2-3)  LIVING ENVIRONMENT: Lives with: lives alone Lives in: House/apartment Stairs: No Has following equipment at home: Single point cane and Walker - 2 wheeled  PLOF: Independent. Uses SPC.  Currently not working. (+) driving.  PATIENT GOALS: To make  spasms go away.  Strengthen L leg.  Improve balance.  OBJECTIVE:  Note: Objective measures were completed at Evaluation unless otherwise noted.  COGNITION: Overall cognitive status: Within functional limits for tasks assessed   SENSATION: Decreased light touch L lateral thigh, medial/lateral lower L leg, and medial L foot  COORDINATION: Decreased L LE (appeared decreased L LE d/t impaired strength and tone)   MUSCLE TONE: Increased tone L LE   POSTURE: rounded shoulders, forward head, and posterior pelvic tilt  LOWER EXTREMITY ROM:     Active  Right Eval Left Eval  Hip flexion WNL At least 90 degrees (limited d/t increased tone)  Hip extension    Hip abduction    Hip adduction    Hip internal rotation    Hip external rotation    Knee flexion WNL WFL (limited d/t increased tone)  Knee extension WNL WNL  Ankle dorsiflexion WNL WFL  Ankle plantarflexion    Ankle inversion    Ankle eversion     (Blank rows = not tested)  LOWER EXTREMITY MMT:    MMT Right Eval Left Eval  Hip flexion 5/5 4+/5  Hip extension    Hip abduction    Hip adduction    Hip internal rotation    Hip external rotation    Knee flexion 5/5 3+/5  Knee extension 5/5 3+/5  Ankle dorsiflexion 5/5 2+/5  Ankle plantarflexion At least 3/5 AROM At least 2/5 AROM  Ankle inversion    Ankle eversion    (Blank rows = not tested)  TRANSFERS: Sit to stand: Modified independence  Assistive device utilized: None     Stand to sit: Modified independence  Assistive device utilized: None      GAIT: Findings: Gait Characteristics: L LE externally rotated; decreased L LE foot clearance and heel strike; increased R lateral lean during R LE stance phase; L LE appearing stiff; decreased L UE arm swing, Distance walked: clinic distances, Assistive device utilized:Single point cane, Level of assistance: Modified independence, and Comments: Pt SBA ambulating without SPC during session.  FUNCTIONAL TESTS:  5 times  sit to stand: 18.44 seconds; no UE support 10 meter walk test: 0.20 m/sec 1st trial and 0.37 m/sec 2nd trial (49.04 seconds 1st trial (limited d/t spasm middle of walking); 26.88 seconds 2nd trial; pt carrying SPC)  PATIENT SURVEYS:  ABC Scale: TBA  TREATMENT DATE: 03/23/24  Self Care: BP and HR taken in sitting at rest beginning of session (see below for details).  No dizziness or lightheadedness reported today.*** There were no vitals filed for this visit.   Therapeutic Activity: To work on functional strengthening and increased WB through LLE: Staggered sit to stands x 10 reps Added in 2 step under RLE x 10 reps Added in 6 step under RLE x 10 reps More of a challenge for balance To address muscle tightness and spasticity in cervical region: Seated LUT stretch 3 x 30 sec Seated L levator scap stretch 3 x 30 sec Seated L SCM stretch 3 x 30 sec Discussed LE stretches for spasticity management: Supine modified Thomas stretch (prescribed last visit) Seated HS stretch Seated gastroc stretch with strap To work on return to gym and closed-chain strengthening exercises: BLE leg press with 100# x 10 reps LLE leg press with 60# x 10 reps Focus on eccentric control and not hyperextending knee ***   PATIENT EDUCATION: Education details: added to HEP, discussed return to gym and closed chain vs open chain exercises for spasticity management - will discuss further in future sessions*** Person educated: Patient Education method: Explanation, Demonstration, Tactile cues, and Verbal cues Education comprehension: verbalized understanding, returned demonstration, and needs further education  HOME EXERCISE PROGRAM: Access Code: [email or web address] URL: [email or web address] Date: 03/16/2024 Prepared by: Damien Caulk  Exercises - Modified Thomas Stretch  - 1  x daily - 7 x weekly - 1 sets - 3-5 reps - 30 sec hold - Supine Heel Slide  - 1 x daily - 7 x weekly - 3 sets - 10 reps - Supine Bridge  - 1 x daily - 5 x weekly - 2-3 sets - 10 reps - Seated Upper Trapezius Stretch  - 1 x daily - 5 x weekly - 1 sets - 3-5 reps - 30 sec hold - Seated Levator Scapulae Stretch  - 1 x daily - 5 x weekly - 1 sets - 3-5 reps - 30 sec hold - Sternocleidomastoid Stretch  - 1 x daily - 5 x weekly - 1 sets - 3-5 reps - 30 sec hold  GOALS: Goals reviewed with patient? Yes  SHORT TERM GOALS: Target date: 04/07/2024  Pt will be independent with initial HEP in order to improve strength and balance in order to decrease fall risk and improve function at home for ADL's. Baseline: TBA Goal status: INITIAL  2.  Pt will improve FGA to 12/30 for decreased fall risk  Baseline: 8/30 (10/27) Goal status: INITIAL  3.  Patient will tolerate 5 seconds of single leg stance B LE's without loss of balance to improve ability to get in and out of shower safely. Baseline: TBA Goal status: INITIAL  LONG TERM GOALS: Target date: 05/05/2024  Pt will be independent with final HEP in order to improve strength and balance in order to decrease fall risk and improve function at home for ADL's. Baseline: TBA Goal status: INITIAL  2.  Pt will decrease 5 Time Sit to Stand by at least 3 seconds in order to demonstrate clinically significant improvement in LE strength. Baseline: 18.44 seconds (Eval) Goal status: INITIAL  3.  Pt will increase by at least 0.13 m/s in order to demonstrate clinically significant improvement in community ambulation.  Baseline: 0.20 m/sec 1st trial and 0.37 m/sec 2nd trial Goal status: INITIAL  4.  Pt will improve FGA to 16/30 for decreased fall risk  Baseline: 8/30 (10/27) Goal status: REVISED  5.  Patient will deny any falls over past 4 weeks to demonstrate improved balance at home and in community. Baseline: Multiple falls a week. Goal status:  INITIAL  6.  Patient will report a worst pain of 3/10 on VAS in order to improve tolerance with ADLs and reduced symptoms with activities.  Baseline: 3/10 (but gets up to 9/10 with spasms)  Goal status: INITIAL   ASSESSMENT:  CLINICAL IMPRESSION: Emphasis of skilled PT session on*** working on increased WB through LLE and functional strengthening for spasticity management as well as reviewing stretches that pt can be performing and working on for L hemibody spasticity management. Pt's goal is to be able to return to the gym and reduce his spasms. He exhibits good LLE control with staggered sit to stands and with leg press and demos good understanding of stretches he should continue working on. Plan to review further closed chain exercises next visit. Continue POC.   OBJECTIVE IMPAIRMENTS: Abnormal gait, decreased activity tolerance, decreased balance, decreased coordination, decreased endurance, decreased knowledge of condition, decreased knowledge of use of DME, decreased mobility, difficulty walking, decreased ROM, decreased strength, decreased safety awareness, increased muscle spasms, impaired flexibility, impaired sensation, impaired tone, improper body mechanics, postural dysfunction, and pain.   ACTIVITY LIMITATIONS: carrying, lifting, bending, sitting, standing, squatting, stairs, transfers, bathing, toileting, dressing, and locomotion level  PARTICIPATION LIMITATIONS: meal prep, cleaning, laundry, shopping, community activity, and occupation  PERSONAL FACTORS: Past/current experiences, Time since onset of injury/illness/exacerbation, and 3+ comorbidities: dyspnea, MS, and vision abnormalities are also affecting patient's functional outcome.   REHAB POTENTIAL: Fair d/t relapsing remitting multiple sclerosis   CLINICAL DECISION MAKING: Evolving/moderate complexity  EVALUATION COMPLEXITY: Moderate  PLAN:  PT FREQUENCY: 2x/week  PT DURATION: 8 weeks  PLANNED INTERVENTIONS:  [id number]- PT Re-evaluation, 97750- Physical Performance Testing, 97110-Therapeutic exercises, 97530- Therapeutic activity, [id number]- Neuromuscular re-education, 97535- Self Care, [id number]- Manual therapy, [id number]- Gait training, [phone]- Orthotic Initial, [phone]- Orthotic/Prosthetic subsequent, [phone]- Aquatic Therapy, [phone]- Electrical stimulation (manual), [id number]- Ultrasound, [id number] (1-2 muscles), [id number] (3+ muscles)- Dry Needling, Patient/Family education, Balance training, Stair training, Taping, Joint mobilization, Spinal mobilization, Visual/preceptual remediation/compensation, DME instructions, Cryotherapy, and Moist heat  PLAN FOR NEXT SESSION: how is HEP going? Add to HEP to address LE strengthening; stretching; pain control; balance; Bioness?, L quad strengthening, increased LLE WB with sit to stands, quadruped (depending on L shoulder pain)? leg press, closed chain strengthening exercises, half-kneel, lunges***   Waddell Southgate, PT Waddell Southgate, PT, DPT, CSRS  03/27/2024, 7:58 AM

## 2024-03-30 ENCOUNTER — Encounter: Payer: Self-pay | Admitting: Physical Therapy

## 2024-03-30 ENCOUNTER — Ambulatory Visit: Admitting: Physical Therapy

## 2024-03-30 VITALS — BP 106/58 | HR 62

## 2024-03-30 DIAGNOSIS — R2689 Other abnormalities of gait and mobility: Secondary | ICD-10-CM

## 2024-03-30 DIAGNOSIS — G35A Relapsing-remitting multiple sclerosis: Secondary | ICD-10-CM

## 2024-03-30 DIAGNOSIS — R252 Cramp and spasm: Secondary | ICD-10-CM

## 2024-03-30 NOTE — Therapy (Signed)
 OUTPATIENT PHYSICAL THERAPY NEURO TREATMENT   Patient Name: Mike Lowery MRN: [id number] DOB:[birth year], 38 y.o., male Today's Date: 03/30/2024   PCP: Vear Charlie LABOR, MD REFERRING PROVIDER: Cary No, NP  END OF SESSION:  PT End of Session - 03/30/24 9061     Visit Number 5    Number of Visits 17   16 plus Eval   Date for Recertification  05/19/24    PT Start Time 0935   pt arrived late   PT Stop Time 1021    PT Time Calculation (min) 46 min    Equipment Utilized During Treatment Gait belt    Activity Tolerance Patient tolerated treatment well;Other (comment)   Intermittent spasms reported during session   Behavior During Therapy [facility] for tasks assessed/performed             Past Medical History:  Diagnosis Date   Depression    situaltional   Dyspnea    Multiple sclerosis 12/24/13   Multiple sclerosis    Vision abnormalities    Past Surgical History:  Procedure Laterality Date   NO PAST SURGERIES     Patient Active Problem List   Diagnosis Date Noted   Leukopenia 04/17/2022   Hyperphosphatemia 04/17/2022   Influenza B 04/14/2022   Acute left-sided weakness 04/14/2022   Left hemiparesis (HCC) 04/14/2022   Substance-induced anxiety disorder (HCC) 02/22/2022   Marijuana use 02/22/2022   MDD (major depressive disorder), recurrent episode, severe (HCC) 02/21/2022   Overdose of undetermined intent 02/21/2022   Urinary hesitancy 12/29/2021   Acute COVID-19 06/30/2021   COVID-19 virus infection 06/04/2020   High risk sexual behavior 12/22/2017   Immunosuppression 12/22/2017   Screening for human immunodeficiency virus 12/22/2017   High risk medication use 12/21/2016   Hepatitis B antibody positive 07/19/2016   Spasticity 06/17/2016   Spells 07/22/2015   Depression with anxiety 07/02/2015   Other fatigue 05/01/2015   Numbness 05/01/2015   Gait disturbance 05/01/2015   Disturbed cognition 05/01/2015   Erectile dysfunction 05/01/2015   Multiple  sclerosis exacerbation 03/11/2015   Tobacco abuse 03/06/2015   Nausea without vomiting 11/08/2014   Depression due to multiple sclerosis 11/08/2014   Relapsing remitting multiple sclerosis 10/01/2014   Swelling of both hands 05/14/2014   Multiple sclerosis 12/24/2013    ONSET DATE: 03/07/2024 (Date of referral)  REFERRING DIAG: G35.A (ICD-10-CM) - Relapsing remitting multiple sclerosis R26.9 (ICD-10-CM) - Gait disturbance R25.2 (ICD-10-CM) - Spasticity  THERAPY DIAG:  Other abnormalities of gait and mobility  Relapsing remitting multiple sclerosis  Spasticity  Rationale for Evaluation and Treatment: Rehabilitation  SUBJECTIVE:  SUBJECTIVE STATEMENT: Pt reports no recent falls and no acute changes.  Did HEP 2x's this week and went well; no questions.  HEP gives him something to do. Pt accompanied by: self  PERTINENT HISTORY: Relapsing remitting MS.  Spasms L>R and legs>shoulders; worse at night.  On baclofen .  Per NP [place] note 03/07/24 pt referred for gait difficulty, frequent falls, and L shoulder pain.  PMH includes dyspnea, depression, MS (01/03/14), and vision abnormalities.  PAIN:  Are you having pain? Yes: NPRS scale: 7-8/10 (but gets up to 9/10 with spasms) Pain location: L>R shoulder/neck area to torso Pain description: sharp shooting and everything tightens up, spasms Aggravating factors: just walking or sitting Relieving factors: pain medications help  PRECAUTIONS: Fall; pt reports falling all the time (walks around home without cane and holds onto furniture--doesn't like to walk with cane in home for sanitary reasons and in general)  RED FLAGS: None   WEIGHT BEARING RESTRICTIONS: No  FALLS: Has patient fallen in last 6 months? Yes. Number of falls multiples falls a week  (2-3)  LIVING ENVIRONMENT: Lives with: lives alone Lives in: House/apartment Stairs: No Has following equipment at home: Single point cane and Walker - 2 wheeled  PLOF: Independent. Uses SPC.  Currently not working. (+) driving.  PATIENT GOALS: To make spasms go away.  Strengthen L leg.  Improve balance.  OBJECTIVE:  Note: Objective measures were completed at Evaluation unless otherwise noted.  COGNITION: Overall cognitive status: Within functional limits for tasks assessed   SENSATION: Decreased light touch L lateral thigh, medial/lateral lower L leg, and medial L foot  COORDINATION: Decreased L LE (appeared decreased L LE d/t impaired strength and tone)   MUSCLE TONE: Increased tone L LE   POSTURE: rounded shoulders, forward head, and posterior pelvic tilt  LOWER EXTREMITY ROM:     Active  Right Eval Left Eval  Hip flexion WNL At least 90 degrees (limited d/t increased tone)  Hip extension    Hip abduction    Hip adduction    Hip internal rotation    Hip external rotation    Knee flexion WNL WFL (limited d/t increased tone)  Knee extension WNL WNL  Ankle dorsiflexion WNL WFL  Ankle plantarflexion    Ankle inversion    Ankle eversion     (Blank rows = not tested)  LOWER EXTREMITY MMT:    MMT Right Eval Left Eval  Hip flexion 5/5 4+/5  Hip extension    Hip abduction    Hip adduction    Hip internal rotation    Hip external rotation    Knee flexion 5/5 3+/5  Knee extension 5/5 3+/5  Ankle dorsiflexion 5/5 2+/5  Ankle plantarflexion At least 3/5 AROM At least 2/5 AROM  Ankle inversion    Ankle eversion    (Blank rows = not tested)  TRANSFERS: Sit to stand: Modified independence  Assistive device utilized: None     Stand to sit: Modified independence  Assistive device utilized: None      GAIT: Findings: Gait Characteristics: L LE externally rotated; decreased L LE foot clearance and heel strike; increased R lateral lean during R LE stance phase; L  LE appearing stiff; decreased L UE arm swing, Distance walked: clinic distances, Assistive device utilized:Single point cane, Level of assistance: Modified independence, and Comments: Pt SBA ambulating without SPC during session.  FUNCTIONAL TESTS:  5 times sit to stand: 18.44 seconds; no UE support 10 meter walk test: 0.20 m/sec 1st trial and 0.37  m/sec 2nd trial (49.04 seconds 1st trial (limited d/t spasm middle of walking); 26.88 seconds 2nd trial; pt carrying SPC)  PATIENT SURVEYS:  ABC Scale: TBA                                                                                                                             TREATMENT DATE: 03/30/24  Self Care: BP and HR taken in sitting towards beginning of session after mat activities (see below for details). Vitals:   03/30/24 1000  BP: (!) 106/58  Pulse: 62   Therapeutic Exercise: Supine modified Thomas stretch: x1 minute x2 sets L LE; x1 minute R LE Supine hip flexion AROM (LE off mat table to on mat table): x10 reps x2 sets R LE (assist to stabilize L LE on mat table); x10 reps x1 set L LE (needing to stop 2x's d/t L sided spasms; assist to bend L knee so toes touch ground prior to initiating L hip flexion) Leg press (focus on eccentric control and not hyperextending knee):  x10 reps B LE's at 100#s x10 reps x2 sets L LE at 60#'s Notes:  6-7/10 RPE post activity  Therapeutic Activity: Sit to stands from mat table (on [employer]): x10 reps x2 sets; no UE support; CGA for safety; vc's for increasing WB'ing through L LE  PATIENT EDUCATION: Education details: Continue HEP and stretching.  Increase L LE WB'ing with transfers. Person educated: Patient Education method: Explanation, Demonstration, Tactile cues, and Verbal cues Education comprehension: verbalized understanding, returned demonstration, and needs further education  HOME EXERCISE PROGRAM: Access Code: [email or web address] URL: [email or web address] Date:  03/16/2024 Prepared by: Damien Caulk  Exercises - Modified Thomas Stretch  - 1 x daily - 7 x weekly - 1 sets - 3-5 reps - 30 sec hold - Supine Heel Slide  - 1 x daily - 7 x weekly - 3 sets - 10 reps - Supine Bridge  - 1 x daily - 5 x weekly - 2-3 sets - 10 reps - Seated Upper Trapezius Stretch  - 1 x daily - 5 x weekly - 1 sets - 3-5 reps - 30 sec hold - Seated Levator Scapulae Stretch  - 1 x daily - 5 x weekly - 1 sets - 3-5 reps - 30 sec hold - Sternocleidomastoid Stretch  - 1 x daily - 5 x weekly - 1 sets - 3-5 reps - 30 sec hold  GOALS: Goals reviewed with patient? Yes  SHORT TERM GOALS: Target date: 04/07/2024  Pt will be independent with initial HEP in order to improve strength and balance in order to decrease fall risk and improve function at home for ADL's. Baseline: TBA Goal status: INITIAL  2.  Pt will improve FGA to 12/30 for decreased fall risk  Baseline: 8/30 (10/27) Goal status: INITIAL  3.  Patient will tolerate 5 seconds of single leg stance B LE's without loss of balance to improve ability to get in and out of shower safely.  Baseline: TBA Goal status: INITIAL  LONG TERM GOALS: Target date: 05/05/2024  Pt will be independent with final HEP in order to improve strength and balance in order to decrease fall risk and improve function at home for ADL's. Baseline: TBA Goal status: INITIAL  2.  Pt will decrease 5 Time Sit to Stand by at least 3 seconds in order to demonstrate clinically significant improvement in LE strength. Baseline: 18.44 seconds (Eval) Goal status: INITIAL  3.  Pt will increase by at least 0.13 m/s in order to demonstrate clinically significant improvement in community ambulation.  Baseline: 0.20 m/sec 1st trial and 0.37 m/sec 2nd trial Goal status: INITIAL  4.  Pt will improve FGA to 16/30 for decreased fall risk  Baseline: 8/30 (10/27) Goal status: REVISED  5.  Patient will deny any falls over past 4 weeks to demonstrate improved  balance at home and in community. Baseline: Multiple falls a week. Goal status: INITIAL  6.  Patient will report a worst pain of 3/10 on VAS in order to improve tolerance with ADLs and reduced symptoms with activities.  Baseline: 3/10 (but gets up to 9/10 with spasms)  Goal status: INITIAL   ASSESSMENT:  CLINICAL IMPRESSION: Patient was seen today for physical therapy treatment to address stretching, strengthening (open and closed chain), and increasing L LE WB'ing.  Focused session on stretching of B hip flexors, hip flexion strengthening to improve foot clearance with ambulation, LE strengthening via leg press, and increasing L LE WB'ing and balance via sit to stands on compliant surface.  Patient continues to be limited by L sided spasms intermittently during session.  Pacing provided during session and pt appearing with appropriate activity tolerance to session (minimal fatigue noted end of session).  They demonstrate improvement in L LE WB'ing and balance with cueing and repetition with sit to stands on compliant surface.  They would continue to benefit from skilled PT to address impairments as noted and progress towards long term goals.  OBJECTIVE IMPAIRMENTS: Abnormal gait, decreased activity tolerance, decreased balance, decreased coordination, decreased endurance, decreased knowledge of condition, decreased knowledge of use of DME, decreased mobility, difficulty walking, decreased ROM, decreased strength, decreased safety awareness, increased muscle spasms, impaired flexibility, impaired sensation, impaired tone, improper body mechanics, postural dysfunction, and pain.   ACTIVITY LIMITATIONS: carrying, lifting, bending, sitting, standing, squatting, stairs, transfers, bathing, toileting, dressing, and locomotion level  PARTICIPATION LIMITATIONS: meal prep, cleaning, laundry, shopping, community activity, and occupation  PERSONAL FACTORS: Past/current experiences, Time since onset of  injury/illness/exacerbation, and 3+ comorbidities: dyspnea, MS, and vision abnormalities are also affecting patient's functional outcome.   REHAB POTENTIAL: Fair d/t relapsing remitting multiple sclerosis   CLINICAL DECISION MAKING: Evolving/moderate complexity  EVALUATION COMPLEXITY: Moderate  PLAN:  PT FREQUENCY: 2x/week  PT DURATION: 8 weeks  PLANNED INTERVENTIONS: 97164- PT Re-evaluation, 97750- Physical Performance Testing, 97110-Therapeutic exercises, 97530- Therapeutic activity, [id number]- Neuromuscular re-education, 97535- Self Care, [id number]- Manual therapy, [id number]- Gait training, [phone]- Orthotic Initial, [phone]- Orthotic/Prosthetic subsequent, [phone]- Aquatic Therapy, [phone]- Electrical stimulation (manual), [id number]- Ultrasound, 79439 (1-2 muscles), [id number] (3+ muscles)- Dry Needling, Patient/Family education, Balance training, Stair training, Taping, Joint mobilization, Spinal mobilization, Visual/preceptual remediation/compensation, DME instructions, Cryotherapy, and Moist heat  PLAN FOR NEXT SESSION: how is HEP going? Add to HEP to address LE strengthening; stretching; pain control; balance; Bioness?, L quad strengthening, increased LLE WB with sit to stands, quadruped (depending on L shoulder pain)? leg press, closed chain strengthening exercises, half-kneel, lunges   Damien Caulk, PT 03/30/2024, 10:40  AM

## 2024-03-31 ENCOUNTER — Ambulatory Visit: Admitting: Family Medicine

## 2024-04-03 ENCOUNTER — Ambulatory Visit: Admitting: Physical Therapy

## 2024-04-03 VITALS — BP 112/60 | HR 63

## 2024-04-03 DIAGNOSIS — G35A Relapsing-remitting multiple sclerosis: Secondary | ICD-10-CM

## 2024-04-03 DIAGNOSIS — R2689 Other abnormalities of gait and mobility: Secondary | ICD-10-CM

## 2024-04-03 DIAGNOSIS — R252 Cramp and spasm: Secondary | ICD-10-CM

## 2024-04-03 NOTE — Therapy (Signed)
 OUTPATIENT PHYSICAL THERAPY NEURO TREATMENT   Patient Name: Mike Lowery MRN: [id number] DOB:12-25-1985, 38 y.o., male Today's Date: 04/03/2024   PCP: Vear Charlie LABOR, MD REFERRING PROVIDER: Cary No, NP  END OF SESSION:  PT End of Session - 04/03/24 0852     Visit Number 6    Number of Visits 17   16 plus Eval   Date for Recertification  05/19/24    PT Start Time 0850   this therapist running behind   PT Stop Time 0915   end early due to fatigue, spasticity   PT Time Calculation (min) 25 min    Equipment Utilized During Treatment --    Activity Tolerance Patient limited by pain;Treatment limited secondary to medical complications (Comment)   fatigue, increased LLE spasticity   Behavior During Therapy [facility] for tasks assessed/performed;Flat affect              Past Medical History:  Diagnosis Date   Depression    situaltional   Dyspnea    Multiple sclerosis 12/24/13   Multiple sclerosis    Vision abnormalities    Past Surgical History:  Procedure Laterality Date   NO PAST SURGERIES     Patient Active Problem List   Diagnosis Date Noted   Leukopenia 04/17/2022   Hyperphosphatemia 04/17/2022   Influenza B 04/14/2022   Acute left-sided weakness 04/14/2022   Left hemiparesis (HCC) 04/14/2022   Substance-induced anxiety disorder (HCC) 02/22/2022   Marijuana use 02/22/2022   MDD (major depressive disorder), recurrent episode, severe (HCC) 02/21/2022   Overdose of undetermined intent 02/21/2022   Urinary hesitancy 12/29/2021   Acute COVID-19 06/30/2021   COVID-19 virus infection 06/04/2020   High risk sexual behavior 12/22/2017   Immunosuppression 12/22/2017   Screening for human immunodeficiency virus 12/22/2017   High risk medication use 12/21/2016   Hepatitis B antibody positive 07/19/2016   Spasticity 06/17/2016   Spells 07/22/2015   Depression with anxiety 07/02/2015   Other fatigue 05/01/2015   Numbness 05/01/2015   Gait disturbance 05/01/2015    Disturbed cognition 05/01/2015   Erectile dysfunction 05/01/2015   Multiple sclerosis exacerbation 03/11/2015   Tobacco abuse 03/06/2015   Nausea without vomiting 11/08/2014   Depression due to multiple sclerosis 11/08/2014   Relapsing remitting multiple sclerosis 10/01/2014   Swelling of both hands 05/14/2014   Multiple sclerosis 12/24/2013    ONSET DATE: 03/07/2024 (Date of referral)  REFERRING DIAG: G35.A (ICD-10-CM) - Relapsing remitting multiple sclerosis R26.9 (ICD-10-CM) - Gait disturbance R25.2 (ICD-10-CM) - Spasticity  THERAPY DIAG:  Other abnormalities of gait and mobility  Relapsing remitting multiple sclerosis  Spasticity  Rationale for Evaluation and Treatment: Rehabilitation  SUBJECTIVE:  SUBJECTIVE STATEMENT:  Pt reports that he is feeling tired today, did not sleep well last night. He reports that his pain is worse today as well and his spasms appear worse and more frequent in his L side.  Pt accompanied by: self  PERTINENT HISTORY: Relapsing remitting MS.  Spasms L>R and legs>shoulders; worse at night.  On baclofen .  Per NP [place] note 03/07/24 pt referred for gait difficulty, frequent falls, and L shoulder pain.  PMH includes dyspnea, depression, MS (01/03/14), and vision abnormalities.  PAIN:  Are you having pain? Yes: NPRS scale: 7-8/10 (but gets up to 9/10 with spasms) Pain location: L>R shoulder/neck area to torso Pain description: sharp shooting and everything tightens up, spasms Aggravating factors: just walking or sitting Relieving factors: pain medications help  PRECAUTIONS: Fall; pt reports falling all the time (walks around home without cane and holds onto furniture--doesn't like to walk with cane in home for sanitary reasons and in general)  RED  FLAGS: None   WEIGHT BEARING RESTRICTIONS: No  FALLS: Has patient fallen in last 6 months? Yes. Number of falls multiples falls a week (2-3)  LIVING ENVIRONMENT: Lives with: lives alone Lives in: House/apartment Stairs: No Has following equipment at home: Single point cane and Walker - 2 wheeled  PLOF: Independent. Uses SPC.  Currently not working. (+) driving.  PATIENT GOALS: To make spasms go away.  Strengthen L leg.  Improve balance.  OBJECTIVE:  Note: Objective measures were completed at Evaluation unless otherwise noted.  COGNITION: Overall cognitive status: Within functional limits for tasks assessed   SENSATION: Decreased light touch L lateral thigh, medial/lateral lower L leg, and medial L foot  COORDINATION: Decreased L LE (appeared decreased L LE d/t impaired strength and tone)   MUSCLE TONE: Increased tone L LE   POSTURE: rounded shoulders, forward head, and posterior pelvic tilt  LOWER EXTREMITY ROM:     Active  Right Eval Left Eval  Hip flexion WNL At least 90 degrees (limited d/t increased tone)  Hip extension    Hip abduction    Hip adduction    Hip internal rotation    Hip external rotation    Knee flexion WNL WFL (limited d/t increased tone)  Knee extension WNL WNL  Ankle dorsiflexion WNL WFL  Ankle plantarflexion    Ankle inversion    Ankle eversion     (Blank rows = not tested)  LOWER EXTREMITY MMT:    MMT Right Eval Left Eval  Hip flexion 5/5 4+/5  Hip extension    Hip abduction    Hip adduction    Hip internal rotation    Hip external rotation    Knee flexion 5/5 3+/5  Knee extension 5/5 3+/5  Ankle dorsiflexion 5/5 2+/5  Ankle plantarflexion At least 3/5 AROM At least 2/5 AROM  Ankle inversion    Ankle eversion    (Blank rows = not tested)  TRANSFERS: Sit to stand: Modified independence  Assistive device utilized: None     Stand to sit: Modified independence  Assistive device utilized: None      GAIT: Findings: Gait  Characteristics: L LE externally rotated; decreased L LE foot clearance and heel strike; increased R lateral lean during R LE stance phase; L LE appearing stiff; decreased L UE arm swing, Distance walked: clinic distances, Assistive device utilized:Single point cane, Level of assistance: Modified independence, and Comments: Pt SBA ambulating without SPC during session.  FUNCTIONAL TESTS:  5 times sit to stand: 18.44 seconds; no UE  support 10 meter walk test: 0.20 m/sec 1st trial and 0.37 m/sec 2nd trial (49.04 seconds 1st trial (limited d/t spasm middle of walking); 26.88 seconds 2nd trial; pt carrying SPC)  PATIENT SURVEYS:  ABC Scale: TBA                                                                                                                             TREATMENT DATE: 04/03/24  Self Care BP and HR taken in sitting at beginning of session, WFL. Vitals:   04/03/24 0857  BP: 112/60  Pulse: 63     Therapeutic Activity To address increased pain and spasticity: Supine LTR x 10 reps B Supine heel slides x 10 reps B Increase in LLE spasticity  Deferred any further treatment after this due to increased pain and spasticity this date as well as increased fatigue and exercises appear to be flaring up his spasticity.  PATIENT EDUCATION: Education details: Continue HEP and stretching as tolerated, go home and get some rest Person educated: Patient Education method: Explanation, Demonstration, Tactile cues, and Verbal cues Education comprehension: verbalized understanding, returned demonstration, and needs further education  HOME EXERCISE PROGRAM: Access Code: [email or web address] URL: [email or web address] Date: 03/16/2024 Prepared by: Damien Caulk  Exercises - Modified Thomas Stretch  - 1 x daily - 7 x weekly - 1 sets - 3-5 reps - 30 sec hold - Supine Heel Slide  - 1 x daily - 7 x weekly - 3 sets - 10 reps - Supine Bridge  - 1 x daily - 5 x weekly - 2-3 sets - 10  reps - Seated Upper Trapezius Stretch  - 1 x daily - 5 x weekly - 1 sets - 3-5 reps - 30 sec hold - Seated Levator Scapulae Stretch  - 1 x daily - 5 x weekly - 1 sets - 3-5 reps - 30 sec hold - Sternocleidomastoid Stretch  - 1 x daily - 5 x weekly - 1 sets - 3-5 reps - 30 sec hold  GOALS: Goals reviewed with patient? Yes  SHORT TERM GOALS: Target date: 04/07/2024  Pt will be independent with initial HEP in order to improve strength and balance in order to decrease fall risk and improve function at home for ADL's. Baseline: TBA Goal status: INITIAL  2.  Pt will improve FGA to 12/30 for decreased fall risk  Baseline: 8/30 (10/27) Goal status: INITIAL  3.  Patient will tolerate 5 seconds of single leg stance B LE's without loss of balance to improve ability to get in and out of shower safely. Baseline: TBA Goal status: INITIAL  LONG TERM GOALS: Target date: 05/05/2024  Pt will be independent with final HEP in order to improve strength and balance in order to decrease fall risk and improve function at home for ADL's. Baseline: TBA Goal status: INITIAL  2.  Pt will decrease 5 Time Sit to Stand by at least 3 seconds in order to demonstrate clinically significant improvement in  LE strength. Baseline: 18.44 seconds (Eval) Goal status: INITIAL  3.  Pt will increase by at least 0.13 m/s in order to demonstrate clinically significant improvement in community ambulation.  Baseline: 0.20 m/sec 1st trial and 0.37 m/sec 2nd trial Goal status: INITIAL  4.  Pt will improve FGA to 16/30 for decreased fall risk  Baseline: 8/30 (10/27) Goal status: REVISED  5.  Patient will deny any falls over past 4 weeks to demonstrate improved balance at home and in community. Baseline: Multiple falls a week. Goal status: INITIAL  6.  Patient will report a worst pain of 3/10 on VAS in order to improve tolerance with ADLs and reduced symptoms with activities.  Baseline: 3/10 (but gets up to 9/10 with  spasms)  Goal status: INITIAL   ASSESSMENT:  CLINICAL IMPRESSION: Session limited by increased pain, spasticity, and fatigue this date. Emphasis of skilled PT session on attempting to perform gentle ROM and stretching of LE. Pt with increased spasticity in his LLE with activities this date with poor tolerance for activity, deferred further participation due to his response to activity this date. Pt walked out to his car for safety and session ended early. Educated pt that he can always call the clinic to cancel his appointment if he is not feeling well like he was today. Continue POC as safe and able.   OBJECTIVE IMPAIRMENTS: Abnormal gait, decreased activity tolerance, decreased balance, decreased coordination, decreased endurance, decreased knowledge of condition, decreased knowledge of use of DME, decreased mobility, difficulty walking, decreased ROM, decreased strength, decreased safety awareness, increased muscle spasms, impaired flexibility, impaired sensation, impaired tone, improper body mechanics, postural dysfunction, and pain.   ACTIVITY LIMITATIONS: carrying, lifting, bending, sitting, standing, squatting, stairs, transfers, bathing, toileting, dressing, and locomotion level  PARTICIPATION LIMITATIONS: meal prep, cleaning, laundry, shopping, community activity, and occupation  PERSONAL FACTORS: Past/current experiences, Time since onset of injury/illness/exacerbation, and 3+ comorbidities: dyspnea, MS, and vision abnormalities are also affecting patient's functional outcome.   REHAB POTENTIAL: Fair d/t relapsing remitting multiple sclerosis   CLINICAL DECISION MAKING: Evolving/moderate complexity  EVALUATION COMPLEXITY: Moderate  PLAN:  PT FREQUENCY: 2x/week  PT DURATION: 8 weeks  PLANNED INTERVENTIONS: 97164- PT Re-evaluation, 97750- Physical Performance Testing, 97110-Therapeutic exercises, 97530- Therapeutic activity, [id number]- Neuromuscular re-education, 97535- Self Care,  97140- Manual therapy, [id number]- Gait training, [id number]- Orthotic Initial, [phone]- Orthotic/Prosthetic subsequent, [phone]- Aquatic Therapy, [phone]- Electrical stimulation (manual), [id number]- Ultrasound, 79439 (1-2 muscles), [id number] (3+ muscles)- Dry Needling, Patient/Family education, Balance training, Stair training, Taping, Joint mobilization, Spinal mobilization, Visual/preceptual remediation/compensation, DME instructions, Cryotherapy, and Moist heat  PLAN FOR NEXT SESSION: how is HEP going? Add to HEP to address LE strengthening; stretching; pain control; balance; Bioness?, L quad strengthening, increased LLE WB with sit to stands, quadruped (depending on L shoulder pain)? leg press, closed chain strengthening exercises, half-kneel, lunges; feeling better after Monday?   Isay Perleberg, PT [place], PT, DPT, CSRS  04/03/2024, 9:20 AM

## 2024-04-06 ENCOUNTER — Ambulatory Visit: Admitting: Physical Therapy

## 2024-04-07 ENCOUNTER — Ambulatory Visit: Admitting: Physical Therapy

## 2024-04-07 VITALS — BP 106/57 | HR 75

## 2024-04-07 DIAGNOSIS — R2689 Other abnormalities of gait and mobility: Secondary | ICD-10-CM

## 2024-04-07 DIAGNOSIS — R252 Cramp and spasm: Secondary | ICD-10-CM

## 2024-04-07 DIAGNOSIS — G35A Relapsing-remitting multiple sclerosis: Secondary | ICD-10-CM

## 2024-04-07 NOTE — Therapy (Signed)
 OUTPATIENT PHYSICAL THERAPY NEURO TREATMENT   Patient Name: Mike Lowery MRN: [id number] DOB:1986/02/08, 38 y.o., male Today's Date: 04/07/2024   PCP: Vear Charlie LABOR, MD REFERRING PROVIDER: Cary No, NP  END OF SESSION:  PT End of Session - 04/07/24 1327     Visit Number 7    Number of Visits 17   16 plus Eval   Date for Recertification  05/19/24    PT Start Time 1325   pt arrived late   PT Stop Time 1400    PT Time Calculation (min) 35 min    Equipment Utilized During Treatment Gait belt    Activity Tolerance Patient limited by pain   increased LLE spasticity   Behavior During Therapy [facility] for tasks assessed/performed;Flat affect               Past Medical History:  Diagnosis Date   Depression    situaltional   Dyspnea    Multiple sclerosis 12/24/13   Multiple sclerosis    Vision abnormalities    Past Surgical History:  Procedure Laterality Date   NO PAST SURGERIES     Patient Active Problem List   Diagnosis Date Noted   Leukopenia 04/17/2022   Hyperphosphatemia 04/17/2022   Influenza B 04/14/2022   Acute left-sided weakness 04/14/2022   Left hemiparesis (HCC) 04/14/2022   Substance-induced anxiety disorder (HCC) 02/22/2022   Marijuana use 02/22/2022   MDD (major depressive disorder), recurrent episode, severe (HCC) 02/21/2022   Overdose of undetermined intent 02/21/2022   Urinary hesitancy 12/29/2021   Acute COVID-19 06/30/2021   COVID-19 virus infection 06/04/2020   High risk sexual behavior 12/22/2017   Immunosuppression 12/22/2017   Screening for human immunodeficiency virus 12/22/2017   High risk medication use 12/21/2016   Hepatitis B antibody positive 07/19/2016   Spasticity 06/17/2016   Spells 07/22/2015   Depression with anxiety 07/02/2015   Other fatigue 05/01/2015   Numbness 05/01/2015   Gait disturbance 05/01/2015   Disturbed cognition 05/01/2015   Erectile dysfunction 05/01/2015   Multiple sclerosis exacerbation 03/11/2015    Tobacco abuse 03/06/2015   Nausea without vomiting 11/08/2014   Depression due to multiple sclerosis 11/08/2014   Relapsing remitting multiple sclerosis 10/01/2014   Swelling of both hands 05/14/2014   Multiple sclerosis 12/24/2013    ONSET DATE: 03/07/2024 (Date of referral)  REFERRING DIAG: G35.A (ICD-10-CM) - Relapsing remitting multiple sclerosis R26.9 (ICD-10-CM) - Gait disturbance R25.2 (ICD-10-CM) - Spasticity  THERAPY DIAG:  Other abnormalities of gait and mobility  Relapsing remitting multiple sclerosis  Spasticity  Rationale for Evaluation and Treatment: Rehabilitation  SUBJECTIVE:  SUBJECTIVE STATEMENT:  Pt reports that he is doing better today than yesterday and earlier this week. Pt fell last night, walking and was not able to catch himself; he was able to get back up and denies any injuries. No questions over HEP.  Pt accompanied by: self  PERTINENT HISTORY: Relapsing remitting MS.  Spasms L>R and legs>shoulders; worse at night.  On baclofen .  Per NP Lomax note 03/07/24 pt referred for gait difficulty, frequent falls, and L shoulder pain.  PMH includes dyspnea, depression, MS (01/03/14), and vision abnormalities.  PAIN:  Are you having pain? Yes: NPRS scale: 7-8/10 (but gets up to 9/10 with spasms) Pain location: L>R shoulder/neck area to torso Pain description: sharp shooting and everything tightens up, spasms Aggravating factors: just walking or sitting Relieving factors: pain medications help  PRECAUTIONS: Fall; pt reports falling all the time (walks around home without cane and holds onto furniture--doesn't like to walk with cane in home for sanitary reasons and in general)  RED FLAGS: None   WEIGHT BEARING RESTRICTIONS: No  FALLS: Has patient fallen in last 6 months?  Yes. Number of falls multiples falls a week (2-3)  LIVING ENVIRONMENT: Lives with: lives alone Lives in: House/apartment Stairs: No Has following equipment at home: Single point cane and Walker - 2 wheeled  PLOF: Independent. Uses SPC.  Currently not working. (+) driving.  PATIENT GOALS: To make spasms go away.  Strengthen L leg.  Improve balance.  OBJECTIVE:  Note: Objective measures were completed at Evaluation unless otherwise noted.  COGNITION: Overall cognitive status: Within functional limits for tasks assessed   SENSATION: Decreased light touch L lateral thigh, medial/lateral lower L leg, and medial L foot  COORDINATION: Decreased L LE (appeared decreased L LE d/t impaired strength and tone)   MUSCLE TONE: Increased tone L LE   POSTURE: rounded shoulders, forward head, and posterior pelvic tilt  LOWER EXTREMITY ROM:     Active  Right Eval Left Eval  Hip flexion WNL At least 90 degrees (limited d/t increased tone)  Hip extension    Hip abduction    Hip adduction    Hip internal rotation    Hip external rotation    Knee flexion WNL WFL (limited d/t increased tone)  Knee extension WNL WNL  Ankle dorsiflexion WNL WFL  Ankle plantarflexion    Ankle inversion    Ankle eversion     (Blank rows = not tested)  LOWER EXTREMITY MMT:    MMT Right Eval Left Eval  Hip flexion 5/5 4+/5  Hip extension    Hip abduction    Hip adduction    Hip internal rotation    Hip external rotation    Knee flexion 5/5 3+/5  Knee extension 5/5 3+/5  Ankle dorsiflexion 5/5 2+/5  Ankle plantarflexion At least 3/5 AROM At least 2/5 AROM  Ankle inversion    Ankle eversion    (Blank rows = not tested)  TRANSFERS: Sit to stand: Modified independence  Assistive device utilized: None     Stand to sit: Modified independence  Assistive device utilized: None      GAIT: Findings: Gait Characteristics: L LE externally rotated; decreased L LE foot clearance and heel strike;  increased R lateral lean during R LE stance phase; L LE appearing stiff; decreased L UE arm swing, Distance walked: clinic distances, Assistive device utilized:Single point cane, Level of assistance: Modified independence, and Comments: Pt SBA ambulating without SPC during session.  FUNCTIONAL TESTS:  5 times sit to  stand: 18.44 seconds; no UE support 10 meter walk test: 0.20 m/sec 1st trial and 0.37 m/sec 2nd trial (49.04 seconds 1st trial (limited d/t spasm middle of walking); 26.88 seconds 2nd trial; pt carrying SPC)  PATIENT SURVEYS:  ABC Scale: TBA                                                                                                                             TREATMENT DATE: 04/07/24  Self Care BP and HR taken in sitting at beginning of session, slightly low but no signs/symptoms of hypotension and monitored patient throughout session. Vitals:   04/07/24 1330  BP: (!) 106/57  Pulse: 75    Physical Performance Initiated FGA assessment but pt becomes unsafe and needs physical assist for balance. Deferred further attempts to complete testing and transitioned to [place].   [facility] PT Assessment - 04/07/24 1331       Standardized Balance Assessment   Standardized Balance Assessment Five Times Sit to Stand;Berg Balance Test    Five times sit to stand comments  14.32 sec   no UE     Berg Balance Test   Sit to Stand Able to stand without using hands and stabilize independently    Standing Unsupported Able to stand safely 2 minutes    Sitting with Back Unsupported but Feet Supported on Floor or Stool Able to sit safely and securely 2 minutes    Stand to Sit Sits safely with minimal use of hands    Transfers Able to transfer safely, minor use of hands    Standing Unsupported with Eyes Closed Able to stand 10 seconds with supervision    Standing Unsupported with Feet Together Able to place feet together independently and stand for 1 minute with supervision    From Standing,  Reach Forward with Outstretched Arm Reaches forward but needs supervision    From Standing Position, Pick up Object from Floor Able to pick up shoe, needs supervision    From Standing Position, Turn to Look Behind Over each Shoulder Turn sideways only but maintains balance    Turn 360 Degrees Needs close supervision or verbal cueing    Standing Unsupported, Alternately Place Feet on Step/Stool Needs assistance to keep from falling or unable to try    Standing Unsupported, One Foot in Front Able to take small step independently and hold 30 seconds    Standing on One Leg Tries to lift leg/unable to hold 3 seconds but remains standing independently    Total Score 36    Berg comment: 36/56, high fall risk      Functional Gait  Assessment   Gait assessed  Yes    Gait Level Surface Walks 20 ft, slow speed, abnormal gait pattern, evidence for imbalance or deviates 10-15 in outside of the 12 in walkway width. Requires more than 7 sec to ambulate 20 ft.    Change in Gait Speed Cannot change speeds, deviates greater than 15 in  outside 12 in walkway width, or loses balance and has to reach for wall or be caught.    Gait with Horizontal Head Turns Performs head turns with moderate changes in gait velocity, slows down, deviates 10-15 in outside 12 in walkway width but recovers, can continue to walk.    Gait with Vertical Head Turns Performs task with moderate change in gait velocity, slows down, deviates 10-15 in outside 12 in walkway width but recovers, can continue to walk.    Gait and Pivot Turn Turns slowly, requires verbal cueing, or requires several small steps to catch balance following turn and stop    Gait with Eyes Closed Cannot walk 20 ft without assistance, severe gait deviations or imbalance, deviates greater than 15 in outside 12 in walkway width or will not attempt task.    Ambulating Backwards Cannot walk 20 ft without assistance, severe gait deviations or imbalance, deviates greater than 15 in  outside 12 in walkway width or will not attempt task.         TherAct To address BLE weakness and difficulty performing step-taps during Berg: Standing alt L/R 6 step taps at bottom of stairs with BUE support Pt with increase in LLE spasticity, deferred any further sets of exercise To assess SLS balance: SLS on each LE in corner: Unable to stand on LLE for longer than 3 sec Unable to lift LLE to stand on RLE  PATIENT EDUCATION: Education details: Continue HEP and stretching as tolerated, results of OM and functional implications, plan to continue with remainder of scheduled PT POC to see if we can continue to attempt to address his spasticity Person educated: Patient Education method: Explanation, Demonstration, Tactile cues, and Verbal cues Education comprehension: verbalized understanding, returned demonstration, and needs further education  HOME EXERCISE PROGRAM: Access Code: [email or web address] URL: [email or web address] Date: 03/16/2024 Prepared by: Damien Caulk  Exercises - Modified Thomas Stretch  - 1 x daily - 7 x weekly - 1 sets - 3-5 reps - 30 sec hold - Supine Heel Slide  - 1 x daily - 7 x weekly - 3 sets - 10 reps - Supine Bridge  - 1 x daily - 5 x weekly - 2-3 sets - 10 reps - Seated Upper Trapezius Stretch  - 1 x daily - 5 x weekly - 1 sets - 3-5 reps - 30 sec hold - Seated Levator Scapulae Stretch  - 1 x daily - 5 x weekly - 1 sets - 3-5 reps - 30 sec hold - Sternocleidomastoid Stretch  - 1 x daily - 5 x weekly - 1 sets - 3-5 reps - 30 sec hold  GOALS: Goals reviewed with patient? Yes  SHORT TERM GOALS: Target date: 04/07/2024  Pt will be independent with initial HEP in order to improve strength and balance in order to decrease fall risk and improve function at home for ADL's. Baseline: TBA Goal status: MET  2.  Pt will improve FGA to 12/30 for decreased fall risk  Baseline: 8/30 (10/27), unable to complete assessment 11/21 due to safety concerns with  balance Goal status: NOT MET  3.  Patient will tolerate 5 seconds of single leg stance B LE's without loss of balance to improve ability to get in and out of shower safely. Baseline: TBA, less than 3 sec LLE and unable to lift LLE to stand on RLE (11/21) Goal status: NOT MET  LONG TERM GOALS: Target date: 05/05/2024  Pt will be independent with final HEP in order to improve strength  and balance in order to decrease fall risk and improve function at home for ADL's. Baseline: TBA Goal status: INITIAL  2.  Pt will decrease 5 Time Sit to Stand by at least 3 seconds in order to demonstrate clinically significant improvement in LE strength. Baseline: 18.44 seconds (Eval), 14.32 sec no UE (11/21) Goal status: MET  3.  Pt will increase by at least 0.13 m/s in order to demonstrate clinically significant improvement in community ambulation.  Baseline: 0.20 m/sec 1st trial and 0.37 m/sec 2nd trial Goal status: INITIAL   GOAL D/C DUE TO DECREASED SAFETY  5.  Patient will deny any falls over past 4 weeks to demonstrate improved balance at home and in community. Baseline: Multiple falls a week., last reported fall 11/20 Goal status: INITIAL  6.  Patient will report a worst pain of 3/10 on VAS in order to improve tolerance with ADLs and reduced symptoms with activities.  Baseline: 3/10 (but gets up to 9/10 with spasms)  Goal status: INITIAL  7.  Pt will improve Berg score to 41/56 for decreased fall risk  Baseline: 36/56 (11/21)  Goal status: INITIAL   ASSESSMENT:  CLINICAL IMPRESSION: Session limited by patient's late arrival as well as ongoing increased spasticity in his LLE. Emphasis of skilled PT session on assessing STG and attempting to work on LE strengthening for spasticity management. Pt has met 1/3 STG due to being independent with his initial stretching HEP. He did not meet 2/3 STG as he was unable to perform SLS x 5 sec on either LE and therapist was unable to reassess his  FGA as his balance became unsafe during test. Transitioned to testing Berg and set new LTG for improvement on Berg. Pt remains very limited by his ongoing LLE spasticity which continues to affect his function and his mobility. He continues to benefit from skilled PT services to work towards improving his strength, ROM, balance, and increasing his safety and independence with functional mobility. Continue POC as safe and able.   OBJECTIVE IMPAIRMENTS: Abnormal gait, decreased activity tolerance, decreased balance, decreased coordination, decreased endurance, decreased knowledge of condition, decreased knowledge of use of DME, decreased mobility, difficulty walking, decreased ROM, decreased strength, decreased safety awareness, increased muscle spasms, impaired flexibility, impaired sensation, impaired tone, improper body mechanics, postural dysfunction, and pain.   ACTIVITY LIMITATIONS: carrying, lifting, bending, sitting, standing, squatting, stairs, transfers, bathing, toileting, dressing, and locomotion level  PARTICIPATION LIMITATIONS: meal prep, cleaning, laundry, shopping, community activity, and occupation  PERSONAL FACTORS: Past/current experiences, Time since onset of injury/illness/exacerbation, and 3+ comorbidities: dyspnea, MS, and vision abnormalities are also affecting patient's functional outcome.   REHAB POTENTIAL: Fair d/t relapsing remitting multiple sclerosis   CLINICAL DECISION MAKING: Evolving/moderate complexity  EVALUATION COMPLEXITY: Moderate  PLAN:  PT FREQUENCY: 2x/week  PT DURATION: 8 weeks  PLANNED INTERVENTIONS: 97164- PT Re-evaluation, 97750- Physical Performance Testing, 97110-Therapeutic exercises, 97530- Therapeutic activity, [id number]- Neuromuscular re-education, 97535- Self Care, [id number]- Manual therapy, [id number]- Gait training, [phone]- Orthotic Initial, [phone]- Orthotic/Prosthetic subsequent, [phone]- Aquatic Therapy, [phone]- Electrical stimulation (manual), [id number]-  Ultrasound, [id number] (1-2 muscles), [id number] (3+ muscles)- Dry Needling, Patient/Family education, Balance training, Stair training, Taping, Joint mobilization, Spinal mobilization, Visual/preceptual remediation/compensation, DME instructions, Cryotherapy, and Moist heat  PLAN FOR NEXT SESSION: how is HEP going? Add to HEP to address LE strengthening; stretching; pain control; balance; Bioness?, L quad strengthening, increased LLE WB with sit to stands, quadruped (depending on L shoulder pain)? leg press, closed chain strengthening exercises, half-kneel, lunges  Waddell Southgate, PT Waddell Southgate, PT, DPT, CSRS  04/07/2024, 2:06 PM

## 2024-04-10 ENCOUNTER — Other Ambulatory Visit: Payer: Self-pay | Admitting: Family Medicine

## 2024-04-10 MED ORDER — AMPHETAMINE-DEXTROAMPHET ER 20 MG PO CP24: 20.0000 mg | ORAL_CAPSULE | Freq: Every day | ORAL | 0 refills | Status: DC

## 2024-04-10 NOTE — Telephone Encounter (Signed)
 Requested Prescriptions   Pending Prescriptions Disp Refills   amphetamine -dextroamphetamine  (ADDERALL  XR) 20 MG 24 hr capsule 30 capsule 0    Sig: Take 1 capsule (20 mg total) by mouth daily.   Last seen 03/07/24 Next appt 10/06/23 Dispenses   Dispensed Days Supply Quantity Provider Pharmacy  D-AMPHETAMINE  ER 20MG  SALT COMBO CP 03/11/2024 30 30 each Lomax, Amy, NP [facility]...  D-AMPHETAMINE  ER 20MG  SALT COMBO CP 02/09/2024 30 30 each Lomax, Amy, NP [facility]...  D-AMPHETAMINE  ER 20MG  SALT COMBO CP 01/13/2024 23 23 each Lomax, Amy, NP [facility] #...  D-AMPHETAMINE  ER 20MG  SALT COMBO CP 01/08/2024 6 6 each Lomax, Amy, NP [facility] #...  D-AMPHETAMINE  ER 20MG  SALT COMBO CP 12/09/2023 30 30 each Sater, Charlie LABOR, MD [facility] #...  D-AMPHETAMINE  ER 20MG  SALT COMBO CP 11/08/2023 30 30 each Lomax, Amy, NP [facility]...  D-AMPHETAMINE  ER 20MG  SALT COMBO CP 10/08/2023 30 30 each Athar, Saima, MD [facility]...  D-AMPHETAMINE  ER 20MG  SALT COMBO CP 09/08/2023 30 30 each Lomax, Amy, NP [facility]...  D-AMPHETAMINE  ER 20MG  SALT COMBO CP 08/06/2023 30 30 each Sater, Charlie LABOR, MD [facility]...  D-AMPHETAMINE  ER 20MG  SALT COMBO CP 07/02/2023 30 30 each Lomax, Amy, NP [facility].[email or web address]

## 2024-04-10 NOTE — Telephone Encounter (Signed)
 Patient request refill for amphetamine -dextroamphetamine  (ADDERALL  XR) 20 MG 24 hr capsule  sent [facility] [phone]

## 2024-04-11 ENCOUNTER — Encounter: Payer: Self-pay | Admitting: Physical Therapy

## 2024-04-11 ENCOUNTER — Ambulatory Visit: Admitting: Physical Therapy

## 2024-04-11 VITALS — BP 107/63 | HR 59

## 2024-04-11 DIAGNOSIS — G35A Relapsing-remitting multiple sclerosis: Secondary | ICD-10-CM

## 2024-04-11 DIAGNOSIS — R252 Cramp and spasm: Secondary | ICD-10-CM

## 2024-04-11 DIAGNOSIS — R2689 Other abnormalities of gait and mobility: Secondary | ICD-10-CM

## 2024-04-11 NOTE — Therapy (Signed)
 OUTPATIENT PHYSICAL THERAPY NEURO TREATMENT   Patient Name: Mike Lowery MRN: [id number] DOB:[birth year], 38 y.o., male Today's Date: 04/11/2024   PCP: Vear Charlie LABOR, MD REFERRING PROVIDER: Cary No, NP  END OF SESSION:  PT End of Session - 04/11/24 0942     Visit Number 8    Number of Visits 17   16 plus Eval   Date for Recertification  05/19/24    PT Start Time 0941   pt arrived late   PT Stop Time 1019    PT Time Calculation (min) 38 min    Equipment Utilized During Treatment Gait belt    Activity Tolerance Patient limited by pain   increased LLE spasticity   Behavior During Therapy [facility] for tasks assessed/performed;Flat affect         Past Medical History:  Diagnosis Date   Depression    situaltional   Dyspnea    Multiple sclerosis 12/24/13   Multiple sclerosis    Vision abnormalities    Past Surgical History:  Procedure Laterality Date   NO PAST SURGERIES     Patient Active Problem List   Diagnosis Date Noted   Leukopenia 04/17/2022   Hyperphosphatemia 04/17/2022   Influenza B 04/14/2022   Acute left-sided weakness 04/14/2022   Left hemiparesis (HCC) 04/14/2022   Substance-induced anxiety disorder (HCC) 02/22/2022   Marijuana use 02/22/2022   MDD (major depressive disorder), recurrent episode, severe (HCC) 02/21/2022   Overdose of undetermined intent 02/21/2022   Urinary hesitancy 12/29/2021   Acute COVID-19 06/30/2021   COVID-19 virus infection 06/04/2020   High risk sexual behavior 12/22/2017   Immunosuppression 12/22/2017   Screening for human immunodeficiency virus 12/22/2017   High risk medication use 12/21/2016   Hepatitis B antibody positive 07/19/2016   Spasticity 06/17/2016   Spells 07/22/2015   Depression with anxiety 07/02/2015   Other fatigue 05/01/2015   Numbness 05/01/2015   Gait disturbance 05/01/2015   Disturbed cognition 05/01/2015   Erectile dysfunction 05/01/2015   Multiple sclerosis exacerbation 03/11/2015   Tobacco  abuse 03/06/2015   Nausea without vomiting 11/08/2014   Depression due to multiple sclerosis 11/08/2014   Relapsing remitting multiple sclerosis 10/01/2014   Swelling of both hands 05/14/2014   Multiple sclerosis 12/24/2013    ONSET DATE: 03/07/2024 (Date of referral)  REFERRING DIAG: G35.A (ICD-10-CM) - Relapsing remitting multiple sclerosis R26.9 (ICD-10-CM) - Gait disturbance R25.2 (ICD-10-CM) - Spasticity  THERAPY DIAG:  Other abnormalities of gait and mobility  Relapsing remitting multiple sclerosis  Spasticity  Rationale for Evaluation and Treatment: Rehabilitation  SUBJECTIVE:  SUBJECTIVE STATEMENT: No falls since last therapy visit.  Took off this weekend from HEP (d/t mom's birthday).  Having L shoulder spasms this morning.  Pt accompanied by: self  PERTINENT HISTORY: Relapsing remitting MS.  Spasms L>R and legs>shoulders; worse at night.  On baclofen .  Per NP [place] note 03/07/24 pt referred for gait difficulty, frequent falls, and L shoulder pain.  PMH includes dyspnea, depression, MS (01/03/14), and vision abnormalities.  PAIN:  Are you having pain? Yes: NPRS scale: 6/10 (but gets up to 9/10 with spasms) Pain location: L>R shoulder/neck area to torso Pain description: sharp shooting and everything tightens up, spasms Aggravating factors: just walking or sitting Relieving factors: pain medications help  PRECAUTIONS: Fall; pt reports falling all the time (walks around home without cane and holds onto furniture--doesn't like to walk with cane in home for sanitary reasons and in general)  RED FLAGS: None   WEIGHT BEARING RESTRICTIONS: No  FALLS: Has patient fallen in last 6 months? Yes. Number of falls multiples falls a week (2-3)  LIVING ENVIRONMENT: Lives with: lives  alone Lives in: House/apartment Stairs: No Has following equipment at home: Single point cane and Walker - 2 wheeled  PLOF: Independent. Uses SPC.  Currently not working. (+) driving.  PATIENT GOALS: To make spasms go away.  Strengthen L leg.  Improve balance.  OBJECTIVE:  Note: Objective measures were completed at Evaluation unless otherwise noted.  COGNITION: Overall cognitive status: Within functional limits for tasks assessed   SENSATION: Decreased light touch L lateral thigh, medial/lateral lower L leg, and medial L foot  COORDINATION: Decreased L LE (appeared decreased L LE d/t impaired strength and tone)   MUSCLE TONE: Increased tone L LE   POSTURE: rounded shoulders, forward head, and posterior pelvic tilt  LOWER EXTREMITY ROM:     Active  Right Eval Left Eval  Hip flexion WNL At least 90 degrees (limited d/t increased tone)  Hip extension    Hip abduction    Hip adduction    Hip internal rotation    Hip external rotation    Knee flexion WNL WFL (limited d/t increased tone)  Knee extension WNL WNL  Ankle dorsiflexion WNL WFL  Ankle plantarflexion    Ankle inversion    Ankle eversion     (Blank rows = not tested)  LOWER EXTREMITY MMT:    MMT Right Eval Left Eval  Hip flexion 5/5 4+/5  Hip extension    Hip abduction    Hip adduction    Hip internal rotation    Hip external rotation    Knee flexion 5/5 3+/5  Knee extension 5/5 3+/5  Ankle dorsiflexion 5/5 2+/5  Ankle plantarflexion At least 3/5 AROM At least 2/5 AROM  Ankle inversion    Ankle eversion    (Blank rows = not tested)  TRANSFERS: Sit to stand: Modified independence  Assistive device utilized: None     Stand to sit: Modified independence  Assistive device utilized: None      GAIT: Findings: Gait Characteristics: L LE externally rotated; decreased L LE foot clearance and heel strike; increased R lateral lean during R LE stance phase; L LE appearing stiff; decreased L UE arm swing,  Distance walked: clinic distances, Assistive device utilized:Single point cane, Level of assistance: Modified independence, and Comments: Pt SBA ambulating without SPC during session.  FUNCTIONAL TESTS:  5 times sit to stand: 18.44 seconds; no UE support 10 meter walk test: 0.20 m/sec 1st trial and 0.37 m/sec 2nd trial (  49.04 seconds 1st trial (limited d/t spasm middle of walking); 26.88 seconds 2nd trial; pt carrying SPC)  PATIENT SURVEYS:  ABC Scale: TBA                                                                                                                             TREATMENT DATE: 04/11/24  Self Care: BP and HR taken in sitting at rest beginning of session (see below for details).  Pt reporting being asymptomatic (symptoms monitored during session). Vitals:   04/11/24 0948  BP: 107/63  Pulse: (!) 59   Therapeutic Exercise (using 30 inch blue theraband ball): Sitting on mat table:   x10 reps forward ball roll (to stretch UE's and trunk to decrease spasms) x10 reps rolling ball to R then L (to stretch trunk and UE's to decrease spasms) Notes: pt reporting improved UE and trunk flexibility after exercise  Therapeutic Activity: Sit to stands from mat table (6 inch box under R foot to increase L LE WB'ing) x10 reps x2 sets Notes: CGA; occasional L sided spasms requiring brief breaks to resolve Toe taps at stairs (on 6 inch step): x10 reps B LE's (alternating) then x5 reps B LE's (alternating) Notes: vc's to decrease L LE circumduction (increased effort to clear L LE especially with fatigue; 2nd set limited d/t L LE fatigue/difficulty tapping step with L foot)  PATIENT EDUCATION: Education details: Continue HEP and stretching as tolerated Person educated: Patient Education method: Explanation, Demonstration, Tactile cues, and Verbal cues Education comprehension: verbalized understanding, returned demonstration, and needs further education  HOME EXERCISE PROGRAM: Access  Code: [email or web address] URL: [email or web address] Date: 03/16/2024 Prepared by: Damien Caulk  Exercises - Modified Thomas Stretch  - 1 x daily - 7 x weekly - 1 sets - 3-5 reps - 30 sec hold - Supine Heel Slide  - 1 x daily - 7 x weekly - 3 sets - 10 reps - Supine Bridge  - 1 x daily - 5 x weekly - 2-3 sets - 10 reps - Seated Upper Trapezius Stretch  - 1 x daily - 5 x weekly - 1 sets - 3-5 reps - 30 sec hold - Seated Levator Scapulae Stretch  - 1 x daily - 5 x weekly - 1 sets - 3-5 reps - 30 sec hold - Sternocleidomastoid Stretch  - 1 x daily - 5 x weekly - 1 sets - 3-5 reps - 30 sec hold  GOALS: Goals reviewed with patient? Yes  SHORT TERM GOALS: Target date: 04/07/2024  Pt will be independent with initial HEP in order to improve strength and balance in order to decrease fall risk and improve function at home for ADL's. Baseline: TBA Goal status: MET  2.  Pt will improve FGA to 12/30 for decreased fall risk  Baseline: 8/30 (10/27), unable to complete assessment 11/21 due to safety concerns with balance Goal status: NOT MET  3.  Patient will tolerate 5 seconds of single leg stance B  LE's without loss of balance to improve ability to get in and out of shower safely. Baseline: TBA, less than 3 sec LLE and unable to lift LLE to stand on RLE (11/21) Goal status: NOT MET  LONG TERM GOALS: Target date: 05/05/2024  Pt will be independent with final HEP in order to improve strength and balance in order to decrease fall risk and improve function at home for ADL's. Baseline: TBA Goal status: INITIAL  2.  Pt will decrease 5 Time Sit to Stand by at least 3 seconds in order to demonstrate clinically significant improvement in LE strength. Baseline: 18.44 seconds (Eval), 14.32 sec no UE (11/21) Goal status: MET  3.  Pt will increase by at least 0.13 m/s in order to demonstrate clinically significant improvement in community ambulation.  Baseline: 0.20 m/sec 1st trial and 0.37  m/sec 2nd trial Goal status: INITIAL   GOAL D/C DUE TO DECREASED SAFETY  5.  Patient will deny any falls over past 4 weeks to demonstrate improved balance at home and in community. Baseline: Multiple falls a week., last reported fall 11/20 Goal status: INITIAL  6.  Patient will report a worst pain of 3/10 on VAS in order to improve tolerance with ADLs and reduced symptoms with activities.  Baseline: 3/10 (but gets up to 9/10 with spasms)  Goal status: INITIAL  7.  Pt will improve Berg score to 41/56 for decreased fall risk  Baseline: 36/56 (11/21)  Goal status: INITIAL   ASSESSMENT:  CLINICAL IMPRESSION: Patient was seen today for physical therapy treatment to address spasticity, transfers, and ambulation.  Focused session on increasing UE and trunk flexibility to decrease spasticity, sit to stands to improve L LE WB'ing/activation, and toe taps on step to improve foot clearance with ambulation.  Patient continues to be limited by L sided spasticity, strength, and balance.  L LE fatigued with toe taps on step requiring rest break.  They demonstrate improvement in L LE WB'ing with transfer activity.  Pt assisted to car via manual w/c to conserve energy end of session.  They would continue to benefit from skilled PT to address impairments as noted and progress towards long term goals.  OBJECTIVE IMPAIRMENTS: Abnormal gait, decreased activity tolerance, decreased balance, decreased coordination, decreased endurance, decreased knowledge of condition, decreased knowledge of use of DME, decreased mobility, difficulty walking, decreased ROM, decreased strength, decreased safety awareness, increased muscle spasms, impaired flexibility, impaired sensation, impaired tone, improper body mechanics, postural dysfunction, and pain.   ACTIVITY LIMITATIONS: carrying, lifting, bending, sitting, standing, squatting, stairs, transfers, bathing, toileting, dressing, and locomotion level  PARTICIPATION  LIMITATIONS: meal prep, cleaning, laundry, shopping, community activity, and occupation  PERSONAL FACTORS: Past/current experiences, Time since onset of injury/illness/exacerbation, and 3+ comorbidities: dyspnea, MS, and vision abnormalities are also affecting patient's functional outcome.   REHAB POTENTIAL: Fair d/t relapsing remitting multiple sclerosis   CLINICAL DECISION MAKING: Evolving/moderate complexity  EVALUATION COMPLEXITY: Moderate  PLAN:  PT FREQUENCY: 2x/week  PT DURATION: 8 weeks  PLANNED INTERVENTIONS: 97164- PT Re-evaluation, 97750- Physical Performance Testing, 97110-Therapeutic exercises, 97530- Therapeutic activity, [id number]- Neuromuscular re-education, 97535- Self Care, [id number]- Manual therapy, [id number]- Gait training, [phone]- Orthotic Initial, [phone]- Orthotic/Prosthetic subsequent, [phone]- Aquatic Therapy, [phone]- Electrical stimulation (manual), [id number]- Ultrasound, [id number] (1-2 muscles), [id number] (3+ muscles)- Dry Needling, Patient/Family education, Balance training, Stair training, Taping, Joint mobilization, Spinal mobilization, Visual/preceptual remediation/compensation, DME instructions, Cryotherapy, and Moist heat  PLAN FOR NEXT SESSION: how is HEP going? Add to HEP to address LE strengthening; stretching; pain  control; balance; Bioness?, L quad strengthening, increased LLE WB with sit to stands, quadruped (depending on L shoulder pain)? leg press, closed chain strengthening exercises, half-kneel, lunges   Damien Caulk, PT 04/11/2024, 6:27 PM

## 2024-04-12 ENCOUNTER — Ambulatory Visit: Admitting: Physical Therapy

## 2024-04-12 ENCOUNTER — Encounter: Payer: Self-pay | Admitting: Physical Therapy

## 2024-04-12 VITALS — BP 112/61 | HR 76

## 2024-04-12 DIAGNOSIS — G35A Relapsing-remitting multiple sclerosis: Secondary | ICD-10-CM

## 2024-04-12 DIAGNOSIS — R2689 Other abnormalities of gait and mobility: Secondary | ICD-10-CM

## 2024-04-12 DIAGNOSIS — R252 Cramp and spasm: Secondary | ICD-10-CM

## 2024-04-12 NOTE — Therapy (Signed)
 OUTPATIENT PHYSICAL THERAPY NEURO TREATMENT   Patient Name: Mike Lowery MRN: [id number] DOB:01/31/1986, 38 y.o., male Today's Date: 04/12/2024   PCP: Vear Charlie LABOR, MD REFERRING PROVIDER: Cary No, NP  END OF SESSION:  PT End of Session - 04/12/24 0939     Visit Number 9    Number of Visits 17   16 plus Eval   Date for Recertification  05/19/24    PT Start Time 0936   pt arrived late   PT Stop Time 1014    PT Time Calculation (min) 38 min    Equipment Utilized During Treatment Gait belt    Activity Tolerance Patient limited by pain   LLE spasticity   Behavior During Therapy [facility] for tasks assessed/performed         Past Medical History:  Diagnosis Date   Depression    situaltional   Dyspnea    Multiple sclerosis 12/24/13   Multiple sclerosis    Vision abnormalities    Past Surgical History:  Procedure Laterality Date   NO PAST SURGERIES     Patient Active Problem List   Diagnosis Date Noted   Leukopenia 04/17/2022   Hyperphosphatemia 04/17/2022   Influenza B 04/14/2022   Acute left-sided weakness 04/14/2022   Left hemiparesis (HCC) 04/14/2022   Substance-induced anxiety disorder (HCC) 02/22/2022   Marijuana use 02/22/2022   MDD (major depressive disorder), recurrent episode, severe (HCC) 02/21/2022   Overdose of undetermined intent 02/21/2022   Urinary hesitancy 12/29/2021   Acute COVID-19 06/30/2021   COVID-19 virus infection 06/04/2020   High risk sexual behavior 12/22/2017   Immunosuppression 12/22/2017   Screening for human immunodeficiency virus 12/22/2017   High risk medication use 12/21/2016   Hepatitis B antibody positive 07/19/2016   Spasticity 06/17/2016   Spells 07/22/2015   Depression with anxiety 07/02/2015   Other fatigue 05/01/2015   Numbness 05/01/2015   Gait disturbance 05/01/2015   Disturbed cognition 05/01/2015   Erectile dysfunction 05/01/2015   Multiple sclerosis exacerbation 03/11/2015   Tobacco abuse 03/06/2015    Nausea without vomiting 11/08/2014   Depression due to multiple sclerosis 11/08/2014   Relapsing remitting multiple sclerosis 10/01/2014   Swelling of both hands 05/14/2014   Multiple sclerosis 12/24/2013    ONSET DATE: 03/07/2024 (Date of referral)  REFERRING DIAG: G35.A (ICD-10-CM) - Relapsing remitting multiple sclerosis R26.9 (ICD-10-CM) - Gait disturbance R25.2 (ICD-10-CM) - Spasticity  THERAPY DIAG:  Other abnormalities of gait and mobility  Relapsing remitting multiple sclerosis  Spasticity  Rationale for Evaluation and Treatment: Rehabilitation  SUBJECTIVE:  SUBJECTIVE STATEMENT: No recent falls reported; stayed in home during day yesterday.  Arrived ambulating with SPC.  No acute changes. Pt accompanied by: self  PERTINENT HISTORY: Relapsing remitting MS.  Spasms L>R and legs>shoulders; worse at night.  On baclofen .  Per NP [place] note 03/07/24 pt referred for gait difficulty, frequent falls, and L shoulder pain.  PMH includes dyspnea, depression, MS (01/03/14), and vision abnormalities.  PAIN:  Are you having pain? Yes: NPRS scale: 6/10 (but gets up to 9/10 with spasms) Pain location: L>R shoulder/neck area to torso Pain description: sharp shooting and everything tightens up, spasms Aggravating factors: just walking or sitting Relieving factors: pain medications help  PRECAUTIONS: Fall; pt reports falling all the time (walks around home without cane and holds onto furniture--doesn't like to walk with cane in home for sanitary reasons and in general)  RED FLAGS: None   WEIGHT BEARING RESTRICTIONS: No  FALLS: Has patient fallen in last 6 months? Yes. Number of falls multiples falls a week (2-3)  LIVING ENVIRONMENT: Lives with: lives alone Lives in: House/apartment Stairs: No Has  following equipment at home: Single point cane and Walker - 2 wheeled  PLOF: Independent. Uses SPC.  Currently not working. (+) driving.  PATIENT GOALS: To make spasms go away.  Strengthen L leg.  Improve balance.  OBJECTIVE:  Note: Objective measures were completed at Evaluation unless otherwise noted.  COGNITION: Overall cognitive status: Within functional limits for tasks assessed   SENSATION: Decreased light touch L lateral thigh, medial/lateral lower L leg, and medial L foot  COORDINATION: Decreased L LE (appeared decreased L LE d/t impaired strength and tone)   MUSCLE TONE: Increased tone L LE   POSTURE: rounded shoulders, forward head, and posterior pelvic tilt  LOWER EXTREMITY ROM:     Active  Right Eval Left Eval  Hip flexion WNL At least 90 degrees (limited d/t increased tone)  Hip extension    Hip abduction    Hip adduction    Hip internal rotation    Hip external rotation    Knee flexion WNL WFL (limited d/t increased tone)  Knee extension WNL WNL  Ankle dorsiflexion WNL WFL  Ankle plantarflexion    Ankle inversion    Ankle eversion     (Blank rows = not tested)  LOWER EXTREMITY MMT:    MMT Right Eval Left Eval  Hip flexion 5/5 4+/5  Hip extension    Hip abduction    Hip adduction    Hip internal rotation    Hip external rotation    Knee flexion 5/5 3+/5  Knee extension 5/5 3+/5  Ankle dorsiflexion 5/5 2+/5  Ankle plantarflexion At least 3/5 AROM At least 2/5 AROM  Ankle inversion    Ankle eversion    (Blank rows = not tested)  TRANSFERS: Sit to stand: Modified independence  Assistive device utilized: None     Stand to sit: Modified independence  Assistive device utilized: None      GAIT: Findings: Gait Characteristics: L LE externally rotated; decreased L LE foot clearance and heel strike; increased R lateral lean during R LE stance phase; L LE appearing stiff; decreased L UE arm swing, Distance walked: clinic distances, Assistive  device utilized:Single point cane, Level of assistance: Modified independence, and Comments: Pt SBA ambulating without SPC during session.  FUNCTIONAL TESTS:  5 times sit to stand: 18.44 seconds; no UE support 10 meter walk test: 0.20 m/sec 1st trial and 0.37 m/sec 2nd trial (49.04 seconds 1st trial (limited  d/t spasm middle of walking); 26.88 seconds 2nd trial; pt carrying SPC)  PATIENT SURVEYS:  ABC Scale: TBA                                                                                                                             TREATMENT DATE: 04/12/24  Self Care: BP and HR taken in sitting at rest beginning of session (see below for details). Vitals:   04/12/24 0943  BP: 112/61  Pulse: 76   Therapeutic Exercise: Supine SAQ's: x5 reps x3 sets L LE x10 reps x3 sets R LE Notes: initial vc's for concentric and eccentric control; more difficulty noted with L LE Sidelying clamshells: X5 reps AAROM L LE clamshells (deferred further attempts at exercise d/t pt reporting only being comfortable laying on his back) Supine bridging: x10 reps (therapist stabilizing B knees and L foot) x10 reps (small compliant air filled ball between knees) Notes: improved hip and core stabilization with ball between knees Supine gentle hip/trunk rotation via 22 inch theraband red ball under LE's (PROM) to increase ROM and decrease spasticity: x10 reps each direction x2 trials Notes: pt's LE's relaxed during activity and pt reporting feeling really good Supine gentle UE/trunk rotation (B UE's outstretched holding small compliant air filled ball rotating R then L to increase ROM and decrease spasticity) x10 reps each direction (R/L)  PATIENT EDUCATION: Education details: Continue HEP and stretching as tolerated Person educated: Patient Education method: Programmer, Multimedia, Demonstration, Tactile cues, and Verbal cues Education comprehension: verbalized understanding, returned demonstration, and needs  further education  HOME EXERCISE PROGRAM: Access Code: [email or web address] URL: [email or web address] Date: 03/16/2024 Prepared by: Damien Caulk  Exercises - Modified Thomas Stretch  - 1 x daily - 7 x weekly - 1 sets - 3-5 reps - 30 sec hold - Supine Heel Slide  - 1 x daily - 7 x weekly - 3 sets - 10 reps - Supine Bridge  - 1 x daily - 5 x weekly - 2-3 sets - 10 reps - Seated Upper Trapezius Stretch  - 1 x daily - 5 x weekly - 1 sets - 3-5 reps - 30 sec hold - Seated Levator Scapulae Stretch  - 1 x daily - 5 x weekly - 1 sets - 3-5 reps - 30 sec hold - Sternocleidomastoid Stretch  - 1 x daily - 5 x weekly - 1 sets - 3-5 reps - 30 sec hold  GOALS: Goals reviewed with patient? Yes  SHORT TERM GOALS: Target date: 04/07/2024  Pt will be independent with initial HEP in order to improve strength and balance in order to decrease fall risk and improve function at home for ADL's. Baseline: TBA Goal status: MET  2.  Pt will improve FGA to 12/30 for decreased fall risk  Baseline: 8/30 (10/27), unable to complete assessment 11/21 due to safety concerns with balance Goal status: NOT MET  3.  Patient will tolerate 5 seconds of single leg stance B LE's without  loss of balance to improve ability to get in and out of shower safely. Baseline: TBA, less than 3 sec LLE and unable to lift LLE to stand on RLE (11/21) Goal status: NOT MET  LONG TERM GOALS: Target date: 05/05/2024  Pt will be independent with final HEP in order to improve strength and balance in order to decrease fall risk and improve function at home for ADL's. Baseline: TBA Goal status: INITIAL  2.  Pt will decrease 5 Time Sit to Stand by at least 3 seconds in order to demonstrate clinically significant improvement in LE strength. Baseline: 18.44 seconds (Eval), 14.32 sec no UE (11/21) Goal status: MET  3.  Pt will increase by at least 0.13 m/s in order to demonstrate clinically significant improvement in community  ambulation.  Baseline: 0.20 m/sec 1st trial and 0.37 m/sec 2nd trial Goal status: INITIAL   GOAL D/C DUE TO DECREASED SAFETY  5.  Patient will deny any falls over past 4 weeks to demonstrate improved balance at home and in community. Baseline: Multiple falls a week., last reported fall 11/20 Goal status: INITIAL  6.  Patient will report a worst pain of 3/10 on VAS in order to improve tolerance with ADLs and reduced symptoms with activities.  Baseline: 3/10 (but gets up to 9/10 with spasms)  Goal status: INITIAL  7.  Pt will improve Berg score to 41/56 for decreased fall risk  Baseline: 36/56 (11/21)  Goal status: INITIAL   ASSESSMENT:  CLINICAL IMPRESSION: Patient was seen today for physical therapy treatment to address strength and flexibility.  Focused session on LE, hip, and core strengthening via exercises on mat table (pt only comfortable in supine limiting positioning).  Also focused on UE, trunk, and LE ROM to increase flexibility and decrease spasticity.  Patient continues to be limited by L sided spasticity, strength, and balance.  They demonstrate improvement in flexibility and decreased spasms with ROM exercises.  They would continue to benefit from skilled PT to address impairments as noted and progress towards long term goals.  OBJECTIVE IMPAIRMENTS: Abnormal gait, decreased activity tolerance, decreased balance, decreased coordination, decreased endurance, decreased knowledge of condition, decreased knowledge of use of DME, decreased mobility, difficulty walking, decreased ROM, decreased strength, decreased safety awareness, increased muscle spasms, impaired flexibility, impaired sensation, impaired tone, improper body mechanics, postural dysfunction, and pain.   ACTIVITY LIMITATIONS: carrying, lifting, bending, sitting, standing, squatting, stairs, transfers, bathing, toileting, dressing, and locomotion level  PARTICIPATION LIMITATIONS: meal prep, cleaning, laundry,  shopping, community activity, and occupation  PERSONAL FACTORS: Past/current experiences, Time since onset of injury/illness/exacerbation, and 3+ comorbidities: dyspnea, MS, and vision abnormalities are also affecting patient's functional outcome.   REHAB POTENTIAL: Fair d/t relapsing remitting multiple sclerosis   CLINICAL DECISION MAKING: Evolving/moderate complexity  EVALUATION COMPLEXITY: Moderate  PLAN:  PT FREQUENCY: 2x/week  PT DURATION: 8 weeks  PLANNED INTERVENTIONS: [id number]- PT Re-evaluation, 97750- Physical Performance Testing, 97110-Therapeutic exercises, 97530- Therapeutic activity, [id number]- Neuromuscular re-education, 97535- Self Care, [id number]- Manual therapy, [id number]- Gait training, [phone]- Orthotic Initial, [phone]- Orthotic/Prosthetic subsequent, [phone]- Aquatic Therapy, [phone]- Electrical stimulation (manual), [id number]- Ultrasound, [id number] (1-2 muscles), [id number] (3+ muscles)- Dry Needling, Patient/Family education, Balance training, Stair training, Taping, Joint mobilization, Spinal mobilization, Visual/preceptual remediation/compensation, DME instructions, Cryotherapy, and Moist heat  PLAN FOR NEXT SESSION: 10th visit progress note due.  How is HEP going? Add to HEP to address LE strengthening; stretching; pain control; balance; Bioness?, L quad strengthening, increased LLE WB with sit to stands, quadruped (depending on L  shoulder pain)? leg press, closed chain strengthening exercises, half-kneel, lunges   Damien Caulk, PT 04/12/2024, 6:59 PM

## 2024-04-17 ENCOUNTER — Ambulatory Visit: Admitting: Physical Therapy

## 2024-04-17 VITALS — BP 131/82 | HR 71

## 2024-04-17 DIAGNOSIS — R2689 Other abnormalities of gait and mobility: Secondary | ICD-10-CM | POA: Diagnosis present

## 2024-04-17 DIAGNOSIS — G35A Relapsing-remitting multiple sclerosis: Secondary | ICD-10-CM | POA: Insufficient documentation

## 2024-04-17 DIAGNOSIS — R252 Cramp and spasm: Secondary | ICD-10-CM | POA: Diagnosis present

## 2024-04-17 NOTE — Therapy (Signed)
 OUTPATIENT PHYSICAL THERAPY NEURO TREATMENT - 10th VISIT PROGRESS NOTE   Patient Name: Mike Lowery MRN: [id number] DOB:1985-12-06, 38 y.o., male Today's Date: 04/17/2024   PCP: Vear Charlie LABOR, MD REFERRING PROVIDER: Cary No, NP  Physical Therapy Progress Note   Dates of Reporting Period: 03/10/2024 - 04/17/2024  See Note below for Objective Data and Assessment of Progress/Goals.  Thank you for the referral of this patient. Waddell Southgate, PT, DPT, CSRS   END OF SESSION:  PT End of Session - 04/17/24 0903     Visit Number 10    Number of Visits 17   16 plus Eval   Date for Recertification  05/19/24    PT Start Time 0900   pt arrived late   PT Stop Time 0930    PT Time Calculation (min) 30 min    Equipment Utilized During Treatment Gait belt    Activity Tolerance Patient tolerated treatment well   LLE spasticity   Behavior During Therapy WFL for tasks assessed/performed          Past Medical History:  Diagnosis Date   Depression    situaltional   Dyspnea    Multiple sclerosis 12/24/13   Multiple sclerosis    Vision abnormalities    Past Surgical History:  Procedure Laterality Date   NO PAST SURGERIES     Patient Active Problem List   Diagnosis Date Noted   Leukopenia 04/17/2022   Hyperphosphatemia 04/17/2022   Influenza B 04/14/2022   Acute left-sided weakness 04/14/2022   Left hemiparesis (HCC) 04/14/2022   Substance-induced anxiety disorder (HCC) 02/22/2022   Marijuana use 02/22/2022   MDD (major depressive disorder), recurrent episode, severe (HCC) 02/21/2022   Overdose of undetermined intent 02/21/2022   Urinary hesitancy 12/29/2021   Acute COVID-19 06/30/2021   COVID-19 virus infection 06/04/2020   High risk sexual behavior 12/22/2017   Immunosuppression 12/22/2017   Screening for human immunodeficiency virus 12/22/2017   High risk medication use 12/21/2016   Hepatitis B antibody positive 07/19/2016   Spasticity 06/17/2016   Spells  07/22/2015   Depression with anxiety 07/02/2015   Other fatigue 05/01/2015   Numbness 05/01/2015   Gait disturbance 05/01/2015   Disturbed cognition 05/01/2015   Erectile dysfunction 05/01/2015   Multiple sclerosis exacerbation 03/11/2015   Tobacco abuse 03/06/2015   Nausea without vomiting 11/08/2014   Depression due to multiple sclerosis 11/08/2014   Relapsing remitting multiple sclerosis 10/01/2014   Swelling of both hands 05/14/2014   Multiple sclerosis 12/24/2013    ONSET DATE: 03/07/2024 (Date of referral)  REFERRING DIAG: G35.A (ICD-10-CM) - Relapsing remitting multiple sclerosis R26.9 (ICD-10-CM) - Gait disturbance R25.2 (ICD-10-CM) - Spasticity  THERAPY DIAG:  Other abnormalities of gait and mobility  Relapsing remitting multiple sclerosis  Spasticity  Rationale for Evaluation and Treatment: Rehabilitation  SUBJECTIVE:  SUBJECTIVE STATEMENT:  Pt denies any falls since last visit. Denies any other acute changes. Continues to use his SPC.  Pt accompanied by: self  PERTINENT HISTORY: Relapsing remitting MS.  Spasms L>R and legs>shoulders; worse at night.  On baclofen .  Per NP [place] note 03/07/24 pt referred for gait difficulty, frequent falls, and L shoulder pain.  PMH includes dyspnea, depression, MS (01/03/14), and vision abnormalities.  PAIN:  Are you having pain? Yes: NPRS scale: 6/10 (but gets up to 9/10 with spasms) Pain location: L>R shoulder/neck area to torso Pain description: sharp shooting and everything tightens up, spasms Aggravating factors: just walking or sitting Relieving factors: pain medications help  PRECAUTIONS: Fall; pt reports falling all the time (walks around home without cane and holds onto furniture--doesn't like to walk with cane in home for sanitary  reasons and in general)  RED FLAGS: None   WEIGHT BEARING RESTRICTIONS: No  FALLS: Has patient fallen in last 6 months? Yes. Number of falls multiples falls a week (2-3)  LIVING ENVIRONMENT: Lives with: lives alone Lives in: House/apartment Stairs: No Has following equipment at home: Single point cane and Walker - 2 wheeled  PLOF: Independent. Uses SPC.  Currently not working. (+) driving.  PATIENT GOALS: To make spasms go away.  Strengthen L leg.  Improve balance.  OBJECTIVE:  Note: Objective measures were completed at Evaluation unless otherwise noted.  COGNITION: Overall cognitive status: Within functional limits for tasks assessed   SENSATION: Decreased light touch L lateral thigh, medial/lateral lower L leg, and medial L foot  COORDINATION: Decreased L LE (appeared decreased L LE d/t impaired strength and tone)   MUSCLE TONE: Increased tone L LE   POSTURE: rounded shoulders, forward head, and posterior pelvic tilt  LOWER EXTREMITY ROM:     Active  Right Eval Left Eval  Hip flexion WNL At least 90 degrees (limited d/t increased tone)  Hip extension    Hip abduction    Hip adduction    Hip internal rotation    Hip external rotation    Knee flexion WNL WFL (limited d/t increased tone)  Knee extension WNL WNL  Ankle dorsiflexion WNL WFL  Ankle plantarflexion    Ankle inversion    Ankle eversion     (Blank rows = not tested)  LOWER EXTREMITY MMT:    MMT Right Eval Left Eval  Hip flexion 5/5 4+/5  Hip extension    Hip abduction    Hip adduction    Hip internal rotation    Hip external rotation    Knee flexion 5/5 3+/5  Knee extension 5/5 3+/5  Ankle dorsiflexion 5/5 2+/5  Ankle plantarflexion At least 3/5 AROM At least 2/5 AROM  Ankle inversion    Ankle eversion    (Blank rows = not tested)  TRANSFERS: Sit to stand: Modified independence  Assistive device utilized: None     Stand to sit: Modified independence  Assistive device utilized: None       GAIT: Findings: Gait Characteristics: L LE externally rotated; decreased L LE foot clearance and heel strike; increased R lateral lean during R LE stance phase; L LE appearing stiff; decreased L UE arm swing, Distance walked: clinic distances, Assistive device utilized:Single point cane, Level of assistance: Modified independence, and Comments: Pt SBA ambulating without SPC during session.  FUNCTIONAL TESTS:  5 times sit to stand: 18.44 seconds; no UE support 10 meter walk test: 0.20 m/sec 1st trial and 0.37 m/sec 2nd trial (49.04 seconds 1st trial (limited  d/t spasm middle of walking); 26.88 seconds 2nd trial; pt carrying SPC)  PATIENT SURVEYS:  ABC Scale: TBA                                                                                                                             TREATMENT DATE: 04/17/24  Self Care BP and HR taken in sitting at rest beginning of session (see below for details). Vitals:   04/17/24 0906  BP: 131/82  Pulse: 71    TherAct/NMR In half kneel positoin on red mat on floor: L/R half knee performing 6# weighted dowel chest press, 3 x 10 reps Gait around therapy gym in between sets with no AD, min A 3 LOB requiring min to mod A to recover Can unlock his L knee from hyperextension but increases spasticity in his LLE  PATIENT EDUCATION: Education details: Continue HEP and stretching as tolerated Person educated: Patient Education method: Explanation, Demonstration, Tactile cues, and Verbal cues Education comprehension: verbalized understanding, returned demonstration, and needs further education  HOME EXERCISE PROGRAM: Access Code: [email or web address] URL: [email or web address] Date: 03/16/2024 Prepared by: Damien Caulk  Exercises - Modified Thomas Stretch  - 1 x daily - 7 x weekly - 1 sets - 3-5 reps - 30 sec hold - Supine Heel Slide  - 1 x daily - 7 x weekly - 3 sets - 10 reps - Supine Bridge  - 1 x daily - 5 x weekly - 2-3 sets - 10  reps - Seated Upper Trapezius Stretch  - 1 x daily - 5 x weekly - 1 sets - 3-5 reps - 30 sec hold - Seated Levator Scapulae Stretch  - 1 x daily - 5 x weekly - 1 sets - 3-5 reps - 30 sec hold - Sternocleidomastoid Stretch  - 1 x daily - 5 x weekly - 1 sets - 3-5 reps - 30 sec hold  GOALS: Goals reviewed with patient? Yes  SHORT TERM GOALS: Target date: 04/07/2024  Pt will be independent with initial HEP in order to improve strength and balance in order to decrease fall risk and improve function at home for ADL's. Baseline: TBA Goal status: MET  2.  Pt will improve FGA to 12/30 for decreased fall risk  Baseline: 8/30 (10/27), unable to complete assessment 11/21 due to safety concerns with balance Goal status: NOT MET  3.  Patient will tolerate 5 seconds of single leg stance B LE's without loss of balance to improve ability to get in and out of shower safely. Baseline: TBA, less than 3 sec LLE and unable to lift LLE to stand on RLE (11/21) Goal status: NOT MET  LONG TERM GOALS: Target date: 05/05/2024  Pt will be independent with final HEP in order to improve strength and balance in order to decrease fall risk and improve function at home for ADL's. Baseline: TBA Goal status: INITIAL  2.  Pt will decrease 5 Time Sit to Stand by at least 3  seconds in order to demonstrate clinically significant improvement in LE strength. Baseline: 18.44 seconds (Eval), 14.32 sec no UE (11/21) Goal status: MET  3.  Pt will increase by at least 0.13 m/s in order to demonstrate clinically significant improvement in community ambulation.  Baseline: 0.20 m/sec 1st trial and 0.37 m/sec 2nd trial Goal status: INITIAL   GOAL D/C DUE TO DECREASED SAFETY  5.  Patient will deny any falls over past 4 weeks to demonstrate improved balance at home and in community. Baseline: Multiple falls a week., last reported fall 11/20 Goal status: INITIAL  6.  Patient will report a worst pain of 3/10 on VAS in  order to improve tolerance with ADLs and reduced symptoms with activities.  Baseline: 3/10 (but gets up to 9/10 with spasms)  Goal status: INITIAL  7.  Pt will improve Berg score to 41/56 for decreased fall risk  Baseline: 36/56 (11/21)  Goal status: INITIAL   ASSESSMENT:  CLINICAL IMPRESSION: Session limited by patient's late arrival. Emphasis of skilled PT session on working on proximal LE strengthening for spasticity management. Pt with ongoing spasticity in his LLE that remains very limiting at times. If he locks his L knee into extension during gait he has reduced spasticity but increased pain in L knee and increased wear and tear on L knee joint ligaments, as well as has to circumduct his limb. He does exhibit decreased pain and improved gait mechanics with L knee unlocked but has increased instances of L knee spasticity with several near falls during session. He continues to benefit from skilled PT services to work improving his strength and spasticity management for increased safety with functional mobility. Continue POC.   OBJECTIVE IMPAIRMENTS: Abnormal gait, decreased activity tolerance, decreased balance, decreased coordination, decreased endurance, decreased knowledge of condition, decreased knowledge of use of DME, decreased mobility, difficulty walking, decreased ROM, decreased strength, decreased safety awareness, increased muscle spasms, impaired flexibility, impaired sensation, impaired tone, improper body mechanics, postural dysfunction, and pain.   ACTIVITY LIMITATIONS: carrying, lifting, bending, sitting, standing, squatting, stairs, transfers, bathing, toileting, dressing, and locomotion level  PARTICIPATION LIMITATIONS: meal prep, cleaning, laundry, shopping, community activity, and occupation  PERSONAL FACTORS: Past/current experiences, Time since onset of injury/illness/exacerbation, and 3+ comorbidities: dyspnea, MS, and vision abnormalities are also affecting patient's  functional outcome.   REHAB POTENTIAL: Fair d/t relapsing remitting multiple sclerosis   CLINICAL DECISION MAKING: Evolving/moderate complexity  EVALUATION COMPLEXITY: Moderate  PLAN:  PT FREQUENCY: 2x/week  PT DURATION: 8 weeks  PLANNED INTERVENTIONS: 97164- PT Re-evaluation, 97750- Physical Performance Testing, 97110-Therapeutic exercises, 97530- Therapeutic activity, [id number]- Neuromuscular re-education, 97535- Self Care, [id number]- Manual therapy, [id number]- Gait training, [phone]- Orthotic Initial, [phone]- Orthotic/Prosthetic subsequent, [phone]- Aquatic Therapy, [phone]- Electrical stimulation (manual), [id number]- Ultrasound, [id number] (1-2 muscles), [id number] (3+ muscles)- Dry Needling, Patient/Family education, Balance training, Stair training, Taping, Joint mobilization, Spinal mobilization, Visual/preceptual remediation/compensation, DME instructions, Cryotherapy, and Moist heat  PLAN FOR NEXT SESSION: How is HEP going? Add to HEP to address LE strengthening; stretching; pain control; balance; Bioness?, L quad strengthening, increased LLE WB with sit to stands, quadruped (depending on L shoulder pain)? leg press, closed chain strengthening exercises, half-kneel, lunges   Waddell Southgate, PT Waddell Southgate, PT, DPT, CSRS  04/17/2024, 9:31 AM

## 2024-04-20 ENCOUNTER — Encounter: Payer: Self-pay | Admitting: Physical Therapy

## 2024-04-20 ENCOUNTER — Ambulatory Visit: Admitting: Physical Therapy

## 2024-04-20 VITALS — BP 119/69 | HR 82

## 2024-04-20 DIAGNOSIS — R2689 Other abnormalities of gait and mobility: Secondary | ICD-10-CM | POA: Diagnosis not present

## 2024-04-20 DIAGNOSIS — R252 Cramp and spasm: Secondary | ICD-10-CM

## 2024-04-20 DIAGNOSIS — G35A Relapsing-remitting multiple sclerosis: Secondary | ICD-10-CM

## 2024-04-20 NOTE — Therapy (Signed)
 OUTPATIENT PHYSICAL THERAPY NEURO TREATMENT   Patient Name: Mike Lowery MRN: [id number] DOB:[birth year], 38 y.o., male Today's Date: 04/20/2024   PCP: Vear Charlie LABOR, MD REFERRING PROVIDER: Cary No, NP  END OF SESSION:  PT End of Session - 04/20/24 0944     Visit Number 11    Number of Visits 17   16 plus Eval   Date for Recertification  05/19/24    PT Start Time 0942   pt arrived late   PT Stop Time 1025    PT Time Calculation (min) 43 min    Equipment Utilized During Treatment Gait belt    Activity Tolerance Patient tolerated treatment well   LLE spasticity   Behavior During Therapy WFL for tasks assessed/performed          Past Medical History:  Diagnosis Date   Depression    situaltional   Dyspnea    Multiple sclerosis 12/24/13   Multiple sclerosis    Vision abnormalities    Past Surgical History:  Procedure Laterality Date   NO PAST SURGERIES     Patient Active Problem List   Diagnosis Date Noted   Leukopenia 04/17/2022   Hyperphosphatemia 04/17/2022   Influenza B 04/14/2022   Acute left-sided weakness 04/14/2022   Left hemiparesis (HCC) 04/14/2022   Substance-induced anxiety disorder (HCC) 02/22/2022   Marijuana use 02/22/2022   MDD (major depressive disorder), recurrent episode, severe (HCC) 02/21/2022   Overdose of undetermined intent 02/21/2022   Urinary hesitancy 12/29/2021   Acute COVID-19 06/30/2021   COVID-19 virus infection 06/04/2020   High risk sexual behavior 12/22/2017   Immunosuppression 12/22/2017   Screening for human immunodeficiency virus 12/22/2017   High risk medication use 12/21/2016   Hepatitis B antibody positive 07/19/2016   Spasticity 06/17/2016   Spells 07/22/2015   Depression with anxiety 07/02/2015   Other fatigue 05/01/2015   Numbness 05/01/2015   Gait disturbance 05/01/2015   Disturbed cognition 05/01/2015   Erectile dysfunction 05/01/2015   Multiple sclerosis exacerbation 03/11/2015   Tobacco abuse  03/06/2015   Nausea without vomiting 11/08/2014   Depression due to multiple sclerosis 11/08/2014   Relapsing remitting multiple sclerosis 10/01/2014   Swelling of both hands 05/14/2014   Multiple sclerosis 12/24/2013    ONSET DATE: 03/07/2024 (Date of referral)  REFERRING DIAG: G35.A (ICD-10-CM) - Relapsing remitting multiple sclerosis R26.9 (ICD-10-CM) - Gait disturbance R25.2 (ICD-10-CM) - Spasticity  THERAPY DIAG:  Other abnormalities of gait and mobility  Relapsing remitting multiple sclerosis  Spasticity  Rationale for Evaluation and Treatment: Rehabilitation  SUBJECTIVE:  SUBJECTIVE STATEMENT: Slipped yesterday in house but caught self (did not fall).  No acute changes.  Arrives ambulating with SPC. Pt accompanied by: self  PERTINENT HISTORY: Relapsing remitting MS.  Spasms L>R and legs>shoulders; worse at night.  On baclofen .  Per NP [place] note 03/07/24 pt referred for gait difficulty, frequent falls, and L shoulder pain.  PMH includes dyspnea, depression, MS (01/03/14), and vision abnormalities.  PAIN:  Are you having pain? Yes: NPRS scale: 6/10 (but gets up to 9/10 with spasms) Pain location: L>R shoulder/neck area to torso Pain description: sharp shooting and everything tightens up, spasms Aggravating factors: just walking or sitting Relieving factors: pain medications help  PRECAUTIONS: Fall; pt reports falling all the time (walks around home without cane and holds onto furniture--doesn't like to walk with cane in home for sanitary reasons and in general)  RED FLAGS: None   WEIGHT BEARING RESTRICTIONS: No  FALLS: Has patient fallen in last 6 months? Yes. Number of falls multiples falls a week (2-3)  LIVING ENVIRONMENT: Lives with: lives alone Lives in:  House/apartment Stairs: No Has following equipment at home: Single point cane and Walker - 2 wheeled  PLOF: Independent. Uses SPC.  Currently not working. (+) driving.  PATIENT GOALS: To make spasms go away.  Strengthen L leg.  Improve balance.  OBJECTIVE:  Note: Objective measures were completed at Evaluation unless otherwise noted.  COGNITION: Overall cognitive status: Within functional limits for tasks assessed   SENSATION: Decreased light touch L lateral thigh, medial/lateral lower L leg, and medial L foot  COORDINATION: Decreased L LE (appeared decreased L LE d/t impaired strength and tone)   MUSCLE TONE: Increased tone L LE   POSTURE: rounded shoulders, forward head, and posterior pelvic tilt  LOWER EXTREMITY ROM:     Active  Right Eval Left Eval  Hip flexion WNL At least 90 degrees (limited d/t increased tone)  Hip extension    Hip abduction    Hip adduction    Hip internal rotation    Hip external rotation    Knee flexion WNL WFL (limited d/t increased tone)  Knee extension WNL WNL  Ankle dorsiflexion WNL WFL  Ankle plantarflexion    Ankle inversion    Ankle eversion     (Blank rows = not tested)  LOWER EXTREMITY MMT:    MMT Right Eval Left Eval  Hip flexion 5/5 4+/5  Hip extension    Hip abduction    Hip adduction    Hip internal rotation    Hip external rotation    Knee flexion 5/5 3+/5  Knee extension 5/5 3+/5  Ankle dorsiflexion 5/5 2+/5  Ankle plantarflexion At least 3/5 AROM At least 2/5 AROM  Ankle inversion    Ankle eversion    (Blank rows = not tested)  TRANSFERS: Sit to stand: Modified independence  Assistive device utilized: None     Stand to sit: Modified independence  Assistive device utilized: None      GAIT: Findings: Gait Characteristics: L LE externally rotated; decreased L LE foot clearance and heel strike; increased R lateral lean during R LE stance phase; L LE appearing stiff; decreased L UE arm swing, Distance walked:  clinic distances, Assistive device utilized:Single point cane, Level of assistance: Modified independence, and Comments: Pt SBA ambulating without SPC during session.  FUNCTIONAL TESTS:  5 times sit to stand: 18.44 seconds; no UE support 10 meter walk test: 0.20 m/sec 1st trial and 0.37 m/sec 2nd trial (49.04 seconds 1st trial (limited  d/t spasm middle of walking); 26.88 seconds 2nd trial; pt carrying SPC)  PATIENT SURVEYS:  ABC Scale: TBA                                                                                                                             TREATMENT DATE: 04/20/24  Self Care: BP and HR taken in sitting at rest beginning of session (see below for details). Vitals:   04/20/24 0948  BP: 119/69  Pulse: 82   Therapeutic Exercise: Leg press: x10 reps B LE's at 120#'s x10 reps x2 sets L LE at 60#'s SciFit multi-peaks up to level 2 for 5 minutes using BUE/BLEs (pt switched to only LE's for last 2 minutes) for neural priming for reciprocal movement, dynamic cardiovascular activity and increased amplitude of stepping. RPE of 6/10 with HR 88 bpm following activity.  Average stride length 11.0 inches.   Therapeutic Activity: Standing on Airex (to improve balance): Bouncing and then catching medium sized air filled compliant ball (within and just outside BOS): x16 reps; x20 reps Notes: CGA for safety; pt self correcting balance with hip and ankle strategies  PATIENT EDUCATION: Education details: Continue HEP and stretching as tolerated Person educated: Patient Education method: Explanation, Demonstration, Tactile cues, and Verbal cues Education comprehension: verbalized understanding, returned demonstration, and needs further education  HOME EXERCISE PROGRAM: Access Code: [email or web address] URL: [email or web address] Date: 03/16/2024 Prepared by: Damien Caulk  Exercises - Modified Thomas Stretch  - 1 x daily - 7 x weekly - 1 sets - 3-5 reps - 30 sec hold -  Supine Heel Slide  - 1 x daily - 7 x weekly - 3 sets - 10 reps - Supine Bridge  - 1 x daily - 5 x weekly - 2-3 sets - 10 reps - Seated Upper Trapezius Stretch  - 1 x daily - 5 x weekly - 1 sets - 3-5 reps - 30 sec hold - Seated Levator Scapulae Stretch  - 1 x daily - 5 x weekly - 1 sets - 3-5 reps - 30 sec hold - Sternocleidomastoid Stretch  - 1 x daily - 5 x weekly - 1 sets - 3-5 reps - 30 sec hold  GOALS: Goals reviewed with patient? Yes  SHORT TERM GOALS: Target date: 04/07/2024  Pt will be independent with initial HEP in order to improve strength and balance in order to decrease fall risk and improve function at home for ADL's. Baseline: TBA Goal status: MET  2.  Pt will improve FGA to 12/30 for decreased fall risk  Baseline: 8/30 (10/27), unable to complete assessment 11/21 due to safety concerns with balance Goal status: NOT MET  3.  Patient will tolerate 5 seconds of single leg stance B LE's without loss of balance to improve ability to get in and out of shower safely. Baseline: TBA, less than 3 sec LLE and unable to lift LLE to stand on RLE (11/21) Goal status: NOT MET  LONG TERM GOALS:  Target date: 05/05/2024  Pt will be independent with final HEP in order to improve strength and balance in order to decrease fall risk and improve function at home for ADL's. Baseline: TBA Goal status: INITIAL  2.  Pt will decrease 5 Time Sit to Stand by at least 3 seconds in order to demonstrate clinically significant improvement in LE strength. Baseline: 18.44 seconds (Eval), 14.32 sec no UE (11/21) Goal status: MET  3.  Pt will increase by at least 0.13 m/s in order to demonstrate clinically significant improvement in community ambulation.  Baseline: 0.20 m/sec 1st trial and 0.37 m/sec 2nd trial Goal status: INITIAL   GOAL D/C DUE TO DECREASED SAFETY  5.  Patient will deny any falls over past 4 weeks to demonstrate improved balance at home and in community. Baseline: Multiple  falls a week., last reported fall 11/20 Goal status: INITIAL  6.  Patient will report a worst pain of 3/10 on VAS in order to improve tolerance with ADLs and reduced symptoms with activities.  Baseline: 3/10 (but gets up to 9/10 with spasms)  Goal status: INITIAL  7.  Pt will improve Berg score to 41/56 for decreased fall risk  Baseline: 36/56 (11/21)  Goal status: INITIAL   ASSESSMENT:  CLINICAL IMPRESSION: Patient was seen today for physical therapy treatment to address strength and balance.  Focused session on improving LE strength (closed chain), improving LE reciprocal movement, and improving balance (dynamic standing activity on compliant surface).  Patient continues to be limited by strength and L sided spasticity although pt demonstrating improvement in spasms today (only occasional mild L sided spasms reported; pt did not need to stop any therapy activities/exercises d/t spasms).  They would continue to benefit from skilled PT to address impairments as noted and progress towards long term goals.  OBJECTIVE IMPAIRMENTS: Abnormal gait, decreased activity tolerance, decreased balance, decreased coordination, decreased endurance, decreased knowledge of condition, decreased knowledge of use of DME, decreased mobility, difficulty walking, decreased ROM, decreased strength, decreased safety awareness, increased muscle spasms, impaired flexibility, impaired sensation, impaired tone, improper body mechanics, postural dysfunction, and pain.   ACTIVITY LIMITATIONS: carrying, lifting, bending, sitting, standing, squatting, stairs, transfers, bathing, toileting, dressing, and locomotion level  PARTICIPATION LIMITATIONS: meal prep, cleaning, laundry, shopping, community activity, and occupation  PERSONAL FACTORS: Past/current experiences, Time since onset of injury/illness/exacerbation, and 3+ comorbidities: dyspnea, MS, and vision abnormalities are also affecting patient's functional outcome.    REHAB POTENTIAL: Fair d/t relapsing remitting multiple sclerosis   CLINICAL DECISION MAKING: Evolving/moderate complexity  EVALUATION COMPLEXITY: Moderate  PLAN:  PT FREQUENCY: 2x/week  PT DURATION: 8 weeks  PLANNED INTERVENTIONS: [id number]- PT Re-evaluation, 97750- Physical Performance Testing, 97110-Therapeutic exercises, 97530- Therapeutic activity, [id number]- Neuromuscular re-education, 97535- Self Care, [id number]- Manual therapy, [id number]- Gait training, [phone]- Orthotic Initial, [phone]- Orthotic/Prosthetic subsequent, [phone]- Aquatic Therapy, [phone]- Electrical stimulation (manual), [id number]- Ultrasound, [id number] (1-2 muscles), [id number] (3+ muscles)- Dry Needling, Patient/Family education, Balance training, Stair training, Taping, Joint mobilization, Spinal mobilization, Visual/preceptual remediation/compensation, DME instructions, Cryotherapy, and Moist heat  PLAN FOR NEXT SESSION: How is HEP going? Add to HEP to address LE strengthening; stretching; pain control; balance; Bioness?, L quad strengthening, increased LLE WB with sit to stands, quadruped (depending on L shoulder pain)? leg press, closed chain strengthening exercises, half-kneel, lunges   Damien Caulk, PT 04/20/2024, 10:47 AM

## 2024-04-24 ENCOUNTER — Ambulatory Visit: Admitting: Physical Therapy

## 2024-04-27 ENCOUNTER — Ambulatory Visit: Admitting: Physical Therapy

## 2024-04-27 VITALS — BP 116/64 | HR 68

## 2024-04-27 DIAGNOSIS — R2689 Other abnormalities of gait and mobility: Secondary | ICD-10-CM | POA: Diagnosis not present

## 2024-04-27 DIAGNOSIS — R252 Cramp and spasm: Secondary | ICD-10-CM

## 2024-04-27 DIAGNOSIS — G35A Relapsing-remitting multiple sclerosis: Secondary | ICD-10-CM

## 2024-04-27 NOTE — Therapy (Signed)
 OUTPATIENT PHYSICAL THERAPY NEURO TREATMENT   Patient Name: Mike Lowery MRN: [id number] DOB:[birth year], 38 y.o., male Today's Date: 04/27/2024   PCP: Vear Charlie LABOR, MD REFERRING PROVIDER: Cary No, NP  END OF SESSION:  PT End of Session - 04/27/24 0948     Visit Number 12    Number of Visits 17   16 plus Eval   Date for Recertification  05/19/24    PT Start Time 0945   pt arrived late   PT Stop Time 1015    PT Time Calculation (min) 30 min    Equipment Utilized During Treatment Gait belt    Activity Tolerance Patient tolerated treatment well   LLE spasticity   Behavior During Therapy WFL for tasks assessed/performed           Past Medical History:  Diagnosis Date   Depression    situaltional   Dyspnea    Multiple sclerosis 12/24/13   Multiple sclerosis    Vision abnormalities    Past Surgical History:  Procedure Laterality Date   NO PAST SURGERIES     Patient Active Problem List   Diagnosis Date Noted   Leukopenia 04/17/2022   Hyperphosphatemia 04/17/2022   Influenza B 04/14/2022   Acute left-sided weakness 04/14/2022   Left hemiparesis (HCC) 04/14/2022   Substance-induced anxiety disorder (HCC) 02/22/2022   Marijuana use 02/22/2022   MDD (major depressive disorder), recurrent episode, severe (HCC) 02/21/2022   Overdose of undetermined intent 02/21/2022   Urinary hesitancy 12/29/2021   Acute COVID-19 06/30/2021   COVID-19 virus infection 06/04/2020   High risk sexual behavior 12/22/2017   Immunosuppression 12/22/2017   Screening for human immunodeficiency virus 12/22/2017   High risk medication use 12/21/2016   Hepatitis B antibody positive 07/19/2016   Spasticity 06/17/2016   Spells 07/22/2015   Depression with anxiety 07/02/2015   Other fatigue 05/01/2015   Numbness 05/01/2015   Gait disturbance 05/01/2015   Disturbed cognition 05/01/2015   Erectile dysfunction 05/01/2015   Multiple sclerosis exacerbation 03/11/2015   Tobacco abuse  03/06/2015   Nausea without vomiting 11/08/2014   Depression due to multiple sclerosis 11/08/2014   Relapsing remitting multiple sclerosis 10/01/2014   Swelling of both hands 05/14/2014   Multiple sclerosis 12/24/2013    ONSET DATE: 03/07/2024 (Date of referral)  REFERRING DIAG: G35.A (ICD-10-CM) - Relapsing remitting multiple sclerosis R26.9 (ICD-10-CM) - Gait disturbance R25.2 (ICD-10-CM) - Spasticity  THERAPY DIAG:  Other abnormalities of gait and mobility  Relapsing remitting multiple sclerosis  Spasticity  Rationale for Evaluation and Treatment: Rehabilitation  SUBJECTIVE:  SUBJECTIVE STATEMENT:   Pt denies any falls or acute changes since last visit. Reports today has been rocky so far. Arrives with his SPC.  Pt accompanied by: self  PERTINENT HISTORY: Relapsing remitting MS.  Spasms L>R and legs>shoulders; worse at night.  On baclofen .  Per NP [place] note 03/07/24 pt referred for gait difficulty, frequent falls, and L shoulder pain.  PMH includes dyspnea, depression, MS (01/03/14), and vision abnormalities.  PAIN:  Are you having pain? Yes: NPRS scale: 6/10 (but gets up to 9/10 with spasms) Pain location: L>R shoulder/neck area to torso Pain description: sharp shooting and everything tightens up, spasms Aggravating factors: just walking or sitting Relieving factors: pain medications help  PRECAUTIONS: Fall; pt reports falling all the time (walks around home without cane and holds onto furniture--doesn't like to walk with cane in home for sanitary reasons and in general)  RED FLAGS: None   WEIGHT BEARING RESTRICTIONS: No  FALLS: Has patient fallen in last 6 months? Yes. Number of falls multiples falls a week (2-3)  LIVING ENVIRONMENT: Lives with: lives alone Lives in:  House/apartment Stairs: No Has following equipment at home: Single point cane and Walker - 2 wheeled  PLOF: Independent. Uses SPC.  Currently not working. (+) driving.  PATIENT GOALS: To make spasms go away.  Strengthen L leg.  Improve balance.  OBJECTIVE:  Note: Objective measures were completed at Evaluation unless otherwise noted.  COGNITION: Overall cognitive status: Within functional limits for tasks assessed   SENSATION: Decreased light touch L lateral thigh, medial/lateral lower L leg, and medial L foot  COORDINATION: Decreased L LE (appeared decreased L LE d/t impaired strength and tone)   MUSCLE TONE: Increased tone L LE   POSTURE: rounded shoulders, forward head, and posterior pelvic tilt  LOWER EXTREMITY ROM:     Active  Right Eval Left Eval  Hip flexion WNL At least 90 degrees (limited d/t increased tone)  Hip extension    Hip abduction    Hip adduction    Hip internal rotation    Hip external rotation    Knee flexion WNL WFL (limited d/t increased tone)  Knee extension WNL WNL  Ankle dorsiflexion WNL WFL  Ankle plantarflexion    Ankle inversion    Ankle eversion     (Blank rows = not tested)  LOWER EXTREMITY MMT:    MMT Right Eval Left Eval  Hip flexion 5/5 4+/5  Hip extension    Hip abduction    Hip adduction    Hip internal rotation    Hip external rotation    Knee flexion 5/5 3+/5  Knee extension 5/5 3+/5  Ankle dorsiflexion 5/5 2+/5  Ankle plantarflexion At least 3/5 AROM At least 2/5 AROM  Ankle inversion    Ankle eversion    (Blank rows = not tested)  TRANSFERS: Sit to stand: Modified independence  Assistive device utilized: None     Stand to sit: Modified independence  Assistive device utilized: None      GAIT: Findings: Gait Characteristics: L LE externally rotated; decreased L LE foot clearance and heel strike; increased R lateral lean during R LE stance phase; L LE appearing stiff; decreased L UE arm swing, Distance walked:  clinic distances, Assistive device utilized:Single point cane, Level of assistance: Modified independence, and Comments: Pt SBA ambulating without SPC during session.  FUNCTIONAL TESTS:  5 times sit to stand: 18.44 seconds; no UE support 10 meter walk test: 0.20 m/sec 1st trial and 0.37 m/sec 2nd trial (  49.04 seconds 1st trial (limited d/t spasm middle of walking); 26.88 seconds 2nd trial; pt carrying SPC)  PATIENT SURVEYS:  ABC Scale: TBA                                                                                                                             TREATMENT DATE: 04/27/24  Self Care: BP and HR taken in sitting at rest beginning of session (see below for details). Vitals:   04/27/24 0950  BP: 116/64  Pulse: 68    Therapeutic Activity: To work on BLE strengthening: Spanish squats X 10 reps with black resistance band, easy 2 x 10 reps with red resistance band, challenging One anterior LOB requiring min A to recover to prevent a fall Cues to maintain weight in midline as pt tends to shift to the R 4 LLE eccentric step downs at ballet bar with LUE support 3 x 10 reps Improved performance as task progresses    PATIENT EDUCATION: Education details: Continue HEP and stretching as tolerated Person educated: Patient Education method: Explanation, Demonstration, Tactile cues, and Verbal cues Education comprehension: verbalized understanding, returned demonstration, and needs further education  HOME EXERCISE PROGRAM: Access Code: [email or web address] URL: [email or web address] Date: 03/16/2024 Prepared by: Damien Caulk  Exercises - Modified Thomas Stretch  - 1 x daily - 7 x weekly - 1 sets - 3-5 reps - 30 sec hold - Supine Heel Slide  - 1 x daily - 7 x weekly - 3 sets - 10 reps - Supine Bridge  - 1 x daily - 5 x weekly - 2-3 sets - 10 reps - Seated Upper Trapezius Stretch  - 1 x daily - 5 x weekly - 1 sets - 3-5 reps - 30 sec hold - Seated Levator  Scapulae Stretch  - 1 x daily - 5 x weekly - 1 sets - 3-5 reps - 30 sec hold - Sternocleidomastoid Stretch  - 1 x daily - 5 x weekly - 1 sets - 3-5 reps - 30 sec hold  GOALS: Goals reviewed with patient? Yes  SHORT TERM GOALS: Target date: 04/07/2024  Pt will be independent with initial HEP in order to improve strength and balance in order to decrease fall risk and improve function at home for ADL's. Baseline: TBA Goal status: MET  2.  Pt will improve FGA to 12/30 for decreased fall risk  Baseline: 8/30 (10/27), unable to complete assessment 11/21 due to safety concerns with balance Goal status: NOT MET  3.  Patient will tolerate 5 seconds of single leg stance B LE's without loss of balance to improve ability to get in and out of shower safely. Baseline: TBA, less than 3 sec LLE and unable to lift LLE to stand on RLE (11/21) Goal status: NOT MET  LONG TERM GOALS: Target date: 05/05/2024  Pt will be independent with final HEP in order to improve strength and balance in order to decrease fall risk and improve function at home  for ADL's. Baseline: TBA Goal status: INITIAL  2.  Pt will decrease 5 Time Sit to Stand by at least 3 seconds in order to demonstrate clinically significant improvement in LE strength. Baseline: 18.44 seconds (Eval), 14.32 sec no UE (11/21) Goal status: MET  3.  Pt will increase by at least 0.13 m/s in order to demonstrate clinically significant improvement in community ambulation.  Baseline: 0.20 m/sec 1st trial and 0.37 m/sec 2nd trial Goal status: INITIAL   GOAL D/C DUE TO DECREASED SAFETY  5.  Patient will deny any falls over past 4 weeks to demonstrate improved balance at home and in community. Baseline: Multiple falls a week., last reported fall 11/20 Goal status: INITIAL  6.  Patient will report a worst pain of 3/10 on VAS in order to improve tolerance with ADLs and reduced symptoms with activities.  Baseline: 3/10 (but gets up to 9/10 with  spasms)  Goal status: INITIAL  7.  Pt will improve Berg score to 41/56 for decreased fall risk  Baseline: 36/56 (11/21)  Goal status: INITIAL   ASSESSMENT:  CLINICAL IMPRESSION: Session limited by patient's late arrival. Emphasis of skilled PT session on continuing to work on BLE strengthening with focus on L quad strengthening. Pt with overall good tolerance to strengthening exercises this date with improved performance as session progresses. He continues to benefit from skilled PT services to work towards LTGs. Continue POC.   OBJECTIVE IMPAIRMENTS: Abnormal gait, decreased activity tolerance, decreased balance, decreased coordination, decreased endurance, decreased knowledge of condition, decreased knowledge of use of DME, decreased mobility, difficulty walking, decreased ROM, decreased strength, decreased safety awareness, increased muscle spasms, impaired flexibility, impaired sensation, impaired tone, improper body mechanics, postural dysfunction, and pain.   ACTIVITY LIMITATIONS: carrying, lifting, bending, sitting, standing, squatting, stairs, transfers, bathing, toileting, dressing, and locomotion level  PARTICIPATION LIMITATIONS: meal prep, cleaning, laundry, shopping, community activity, and occupation  PERSONAL FACTORS: Past/current experiences, Time since onset of injury/illness/exacerbation, and 3+ comorbidities: dyspnea, MS, and vision abnormalities are also affecting patient's functional outcome.   REHAB POTENTIAL: Fair d/t relapsing remitting multiple sclerosis   CLINICAL DECISION MAKING: Evolving/moderate complexity  EVALUATION COMPLEXITY: Moderate  PLAN:  PT FREQUENCY: 2x/week  PT DURATION: 8 weeks  PLANNED INTERVENTIONS: 97164- PT Re-evaluation, 97750- Physical Performance Testing, 97110-Therapeutic exercises, 97530- Therapeutic activity, [id number]- Neuromuscular re-education, 97535- Self Care, [id number]- Manual therapy, [id number]- Gait training, [phone]- Orthotic Initial,  [phone]- Orthotic/Prosthetic subsequent, [phone]- Aquatic Therapy, [phone]- Electrical stimulation (manual), [id number]- Ultrasound, [id number] (1-2 muscles), [id number] (3+ muscles)- Dry Needling, Patient/Family education, Balance training, Stair training, Taping, Joint mobilization, Spinal mobilization, Visual/preceptual remediation/compensation, DME instructions, Cryotherapy, and Moist heat  PLAN FOR NEXT SESSION: How is HEP going? Add to HEP to address LE strengthening; stretching; pain control; balance; Bioness?, L quad strengthening, increased LLE WB with sit to stands, quadruped (depending on L shoulder pain)? leg press, closed chain strengthening exercises, half-kneel, lunges, eccentric step downs (can add to HEP)   Waddell Southgate, PT Waddell Southgate, PT, DPT, CSRS  04/27/2024, 10:15 AM

## 2024-05-01 ENCOUNTER — Ambulatory Visit: Admitting: Physical Therapy

## 2024-05-04 ENCOUNTER — Encounter: Payer: Self-pay | Admitting: Physical Therapy

## 2024-05-04 ENCOUNTER — Ambulatory Visit: Admitting: Physical Therapy

## 2024-05-04 VITALS — BP 91/76 | HR 67

## 2024-05-04 DIAGNOSIS — R2689 Other abnormalities of gait and mobility: Secondary | ICD-10-CM | POA: Diagnosis not present

## 2024-05-04 DIAGNOSIS — G35A Relapsing-remitting multiple sclerosis: Secondary | ICD-10-CM

## 2024-05-04 DIAGNOSIS — R252 Cramp and spasm: Secondary | ICD-10-CM

## 2024-05-04 NOTE — Therapy (Signed)
 OUTPATIENT PHYSICAL THERAPY NEURO TREATMENT--DISCHARGE  PHYSICAL THERAPY DISCHARGE SUMMARY  Visits from Start of Care: 13  Current functional level related to goals / functional outcomes: Modified independent ambulating with SPC   Remaining deficits: L sided pain, spasms, and spasticity; strength; balance; gait.   Education / Equipment: HEP   Patient agrees to discharge. Patient goals were not met. Patient is being discharged due to lack of progress.    Patient Name: Mike Lowery MRN: [id number] DOB:1985-08-05, 38 y.o., male Today's Date: 05/04/2024   PCP: Vear Charlie LABOR, MD REFERRING PROVIDER: Cary No, NP  END OF SESSION:  PT End of Session - 05/04/24 0940     Visit Number 13    Number of Visits 17   16 plus Eval   Date for Recertification  05/19/24    PT Start Time 0936   pt arrived late   PT Stop Time 1015    PT Time Calculation (min) 39 min    Equipment Utilized During Treatment Gait belt    Activity Tolerance Patient tolerated treatment well;Other (comment)   LLE spasticity; intermittent L sided spasms   Behavior During Therapy [facility] for tasks assessed/performed           Past Medical History:  Diagnosis Date   Depression    situaltional   Dyspnea    Multiple sclerosis 12/24/13   Multiple sclerosis    Vision abnormalities    Past Surgical History:  Procedure Laterality Date   NO PAST SURGERIES     Patient Active Problem List   Diagnosis Date Noted   Leukopenia 04/17/2022   Hyperphosphatemia 04/17/2022   Influenza B 04/14/2022   Acute left-sided weakness 04/14/2022   Left hemiparesis (HCC) 04/14/2022   Substance-induced anxiety disorder (HCC) 02/22/2022   Marijuana use 02/22/2022   MDD (major depressive disorder), recurrent episode, severe (HCC) 02/21/2022   Overdose of undetermined intent 02/21/2022   Urinary hesitancy 12/29/2021   Acute COVID-19 06/30/2021   COVID-19 virus infection 06/04/2020   High risk sexual behavior 12/22/2017    Immunosuppression 12/22/2017   Screening for human immunodeficiency virus 12/22/2017   High risk medication use 12/21/2016   Hepatitis B antibody positive 07/19/2016   Spasticity 06/17/2016   Spells 07/22/2015   Depression with anxiety 07/02/2015   Other fatigue 05/01/2015   Numbness 05/01/2015   Gait disturbance 05/01/2015   Disturbed cognition 05/01/2015   Erectile dysfunction 05/01/2015   Multiple sclerosis exacerbation 03/11/2015   Tobacco abuse 03/06/2015   Nausea without vomiting 11/08/2014   Depression due to multiple sclerosis 11/08/2014   Relapsing remitting multiple sclerosis 10/01/2014   Swelling of both hands 05/14/2014   Multiple sclerosis 12/24/2013    ONSET DATE: 03/07/2024 (Date of referral)  REFERRING DIAG: G35.A (ICD-10-CM) - Relapsing remitting multiple sclerosis R26.9 (ICD-10-CM) - Gait disturbance R25.2 (ICD-10-CM) - Spasticity  THERAPY DIAG:  Other abnormalities of gait and mobility  Relapsing remitting multiple sclerosis  Spasticity  Rationale for Evaluation and Treatment: Rehabilitation  SUBJECTIVE:  SUBJECTIVE STATEMENT: No recent falls reported.  Pt reports having tough morning.  Arrives ambulating with SPC.  Pt accompanied by: self  PERTINENT HISTORY: Relapsing remitting MS.  Spasms L>R and legs>shoulders; worse at night.  On baclofen .  Per NP [place] note 03/07/24 pt referred for gait difficulty, frequent falls, and L shoulder pain.  PMH includes dyspnea, depression, MS (01/03/14), and vision abnormalities.  PAIN:  Are you having pain? Yes: NPRS scale: 8/10 (but increases with spasms) Pain location: L>R shoulder/neck area to torso Pain description: sharp shooting and everything tightens up, spasms Aggravating factors: just walking or sitting Relieving  factors: pain medications help  PRECAUTIONS: Fall; pt reports falling all the time (walks around home without cane and holds onto furniture--doesn't like to walk with cane in home for sanitary reasons and in general)  RED FLAGS: None   WEIGHT BEARING RESTRICTIONS: No  FALLS: Has patient fallen in last 6 months? Yes. Number of falls multiples falls a week (2-3)  LIVING ENVIRONMENT: Lives with: lives alone Lives in: House/apartment Stairs: No Has following equipment at home: Single point cane and Walker - 2 wheeled  PLOF: Independent. Uses SPC.  Currently not working. (+) driving.  PATIENT GOALS: To make spasms go away.  Strengthen L leg.  Improve balance.  OBJECTIVE:  Note: Objective measures were completed at Evaluation unless otherwise noted.  COGNITION: Overall cognitive status: Within functional limits for tasks assessed   SENSATION: Decreased light touch L lateral thigh, medial/lateral lower L leg, and medial L foot  COORDINATION: Decreased L LE (appeared decreased L LE d/t impaired strength and tone)   MUSCLE TONE: Increased tone L LE   POSTURE: rounded shoulders, forward head, and posterior pelvic tilt  LOWER EXTREMITY ROM:     Active  Right Eval Left Eval  Hip flexion WNL At least 90 degrees (limited d/t increased tone)  Hip extension    Hip abduction    Hip adduction    Hip internal rotation    Hip external rotation    Knee flexion WNL WFL (limited d/t increased tone)  Knee extension WNL WNL  Ankle dorsiflexion WNL WFL  Ankle plantarflexion    Ankle inversion    Ankle eversion     (Blank rows = not tested)  LOWER EXTREMITY MMT:    MMT Right Eval Left Eval  Hip flexion 5/5 4+/5  Hip extension    Hip abduction    Hip adduction    Hip internal rotation    Hip external rotation    Knee flexion 5/5 3+/5  Knee extension 5/5 3+/5  Ankle dorsiflexion 5/5 2+/5  Ankle plantarflexion At least 3/5 AROM At least 2/5 AROM  Ankle inversion    Ankle  eversion    (Blank rows = not tested)  TRANSFERS: Sit to stand: Modified independence  Assistive device utilized: None     Stand to sit: Modified independence  Assistive device utilized: None      GAIT: Findings: Gait Characteristics: L LE externally rotated; decreased L LE foot clearance and heel strike; increased R lateral lean during R LE stance phase; L LE appearing stiff; decreased L UE arm swing, Distance walked: clinic distances, Assistive device utilized:Single point cane, Level of assistance: Modified independence, and Comments: Pt SBA ambulating without SPC during session.  FUNCTIONAL TESTS:  5 times sit to stand: 18.44 seconds; no UE support on eval; 15.69 seconds 05/04/24 10 meter walk test: 0.20 m/sec 1st trial and 0.37 m/sec 2nd trial (49.04 seconds 1st trial (limited d/t  spasm middle of walking); 26.88 seconds 2nd trial; pt carrying SPC)  PATIENT SURVEYS:  ABC Scale: TBA                                                                                                                             TREATMENT DATE: 05/04/24  Self Care: BP and HR taken in sitting at rest beginning of session (see below for details).  Pt reporting being asymptomatic.  Monitored for symptoms during session. Vitals:   05/04/24 0943  BP: 91/76  Pulse: 67  Education on LTG's and POC  Therapeutic Activities: 5 time sit to stand: 15.69 seconds (no UE support) : 0.17 m/sec (57.66 seconds) with SPC; pt with a few muscle spasms during test requiring standing break Ambulation clinic distances with SPC (CGA for safety and increased time for ambulation d/t intermittent L sided spasms)  Physical Performance Testing: Berg Balance Test: 36/56 (see below for further details) Item Test date: 05/04/24 Date:  Date:   Sitting to standing 3. able to stand independently using hands Insert SmartPhrase OPRCBERGREEVAL Insert SmartPhrase OPRCBERGREEVAL  2. Standing unsupported 4. able to stand safely for 2  minutes    3. Sitting with back unsupported, feet supported 4. able to sit safely and securely for 2 minutes    4. Standing to sitting 3. controls descent by using hands    5. Pivot transfer  3. able to transfer safely with definite need of hands    6. Standing unsupported with eyes closed 4. able to stand 10 seconds safely    7. Standing unsupported with feet together 3. able to place feet together independently and stand 1 minute with supervision    8. Reaching forward with outstretched arms while standing 3. can reach forward 12 cm (5 inches)    9. Pick up object from the floor from standing 4. able to pick up slipper safely and easily    10. Turning to look behind over left and right shoulders while standing 3. looks behind one side only, other side shows less weight shift    11. Turn 360 degrees 2. able to turn 360 degrees safely but slowly.  12.46 seconds both directions.    12. Place alternate foot on step or stool while standing unsupported 0. needs assistance to keep from falling/unable to try    13. Standing unsupported one foot in front 0. loses balance while stepping or standing    14. Standing on one leg 0. unable to try of needs assist to prevent fall      Total Score 36/56 Total Score:    Total Score:     PATIENT EDUCATION: Education details: Continue HEP and stretching as tolerated.  LTG results.  POC (plan to discharge from PT today). Person educated: Patient Education method: Explanation, Demonstration, Tactile cues, and Verbal cues Education comprehension: verbalized understanding, returned demonstration, and needs further education  HOME EXERCISE PROGRAM: Access Code: [email or web address] URL: [email or web address] Date: 03/16/2024 Prepared by:  Damien Liadan Guizar  Exercises - Modified Thomas Stretch  - 1 x daily - 7 x weekly - 1 sets - 3-5 reps - 30 sec hold - Supine Heel Slide  - 1 x daily - 7 x weekly - 3 sets - 10 reps - Supine Bridge  - 1 x daily - 5 x weekly -  2-3 sets - 10 reps - Seated Upper Trapezius Stretch  - 1 x daily - 5 x weekly - 1 sets - 3-5 reps - 30 sec hold - Seated Levator Scapulae Stretch  - 1 x daily - 5 x weekly - 1 sets - 3-5 reps - 30 sec hold - Sternocleidomastoid Stretch  - 1 x daily - 5 x weekly - 1 sets - 3-5 reps - 30 sec hold  GOALS: Goals reviewed with patient? Yes  SHORT TERM GOALS: Target date: 04/07/2024  Pt will be independent with initial HEP in order to improve strength and balance in order to decrease fall risk and improve function at home for ADL's. Baseline: TBA Goal status: MET  2.  Pt will improve FGA to 12/30 for decreased fall risk  Baseline: 8/30 (10/27), unable to complete assessment 11/21 due to safety concerns with balance Goal status: NOT MET  3.  Patient will tolerate 5 seconds of single leg stance B LE's without loss of balance to improve ability to get in and out of shower safely. Baseline: TBA, less than 3 sec LLE and unable to lift LLE to stand on RLE (11/21) Goal status: NOT MET  LONG TERM GOALS: Target date: 05/05/2024  Pt will be independent with final HEP in order to improve strength and balance in order to decrease fall risk and improve function at home for ADL's. Baseline: Issued Goal status: PROGRESSING (doing HEP depending on how he is feeling)  2.  Pt will decrease 5 Time Sit to Stand by at least 3 seconds in order to demonstrate clinically significant improvement in LE strength. Baseline: 18.44 seconds (Eval), 14.32 sec no UE (11/21); 15.69 seconds 05/04/24 Goal status: PROGRESSING  3.  Pt will increase by at least 0.13 m/s in order to demonstrate clinically significant improvement in community ambulation.  Baseline: 0.20 m/sec 1st trial and 0.37 m/sec 2nd trial; 0.17 m/sec 05/04/24 Goal status: NOT MET   GOAL D/C DUE TO DECREASED SAFETY  5.  Patient will deny any falls over past 4 weeks to demonstrate improved balance at home and in community. Baseline: Multiple falls  a week., last reported fall 11/20 Goal status: NOT MET (no change reported from usual)  6.  Patient will report a worst pain of 3/10 on VAS in order to improve tolerance with ADLs and reduced symptoms with activities.  Baseline: 3/10 (but gets up to 9/10 with spasms) on Eval; 10/10 with spasms 05/04/24  Goal status: NOT MET  7.  Pt will improve Berg score to 41/56 for decreased fall risk  Baseline: 36/56 (11/21); 36/56 on 05/04/24  Goal status: NOT MET   ASSESSMENT:  CLINICAL IMPRESSION: Patient was seen today for physical therapy treatment to address LTG's and POC.  Focused session on assessing LTG's and performing outcome testing including 5 time sit to stand, , and BERG Balance test.  Pt did not meet 4/6 goals and progressing with 2/5 goals.  Pt scored 15.69 seconds on the 5 time sit to stand test indicating pt is at increased risk of falls (>15 seconds = increased risk of falls); improved from eval but decreased compared to  14.32 seconds on 04/07/24.  Pt scored 0.17 m/sec on the 10 Meter Walk Test indicating pt is a household ambulatory (Cut off scores: <0.4 m/s = household Ambulator, 0.4-0.8 m/s = limited community Ambulator, >0.8 m/s = community Ambulator, >1.2 m/s = crossing a street, <1.0 = increased fall risk); score decreased from 0.20 m/sec 1st trial and 0.37 m/sec 2nd trial on eval; gait speed limited today (and appears to fluctuate in general) based on L sided spasms causing pt to stop during test.  Pt scored 36/56 on the Berg Balance test indicating pt is at a high fall risk (<40/56 = high fall risk; 40-45/56 = moderate fall risk; >45/56 = low fall risk); pt score same as 04/07/24 assessment.  Discussed current status and limited progress with therapy.  D/t limited progress with therapy, plan to discharge from therapy (discussed with pt; pt verbalizing understanding).   OBJECTIVE IMPAIRMENTS: Abnormal gait, decreased activity tolerance, decreased balance, decreased coordination,  decreased endurance, decreased knowledge of condition, decreased knowledge of use of DME, decreased mobility, difficulty walking, decreased ROM, decreased strength, decreased safety awareness, increased muscle spasms, impaired flexibility, impaired sensation, impaired tone, improper body mechanics, postural dysfunction, and pain.   ACTIVITY LIMITATIONS: carrying, lifting, bending, sitting, standing, squatting, stairs, transfers, bathing, toileting, dressing, and locomotion level  PARTICIPATION LIMITATIONS: meal prep, cleaning, laundry, shopping, community activity, and occupation  PERSONAL FACTORS: Past/current experiences, Time since onset of injury/illness/exacerbation, and 3+ comorbidities: dyspnea, MS, and vision abnormalities are also affecting patient's functional outcome.   REHAB POTENTIAL: Fair d/t relapsing remitting multiple sclerosis   CLINICAL DECISION MAKING: Evolving/moderate complexity  EVALUATION COMPLEXITY: Moderate  PLAN:  PT FREQUENCY: 2x/week  PT DURATION: 8 weeks  PLANNED INTERVENTIONS: [id number]- PT Re-evaluation, 97750- Physical Performance Testing, 97110-Therapeutic exercises, 97530- Therapeutic activity, [id number]- Neuromuscular re-education, 97535- Self Care, [id number]- Manual therapy, [id number]- Gait training, [phone]- Orthotic Initial, [phone]- Orthotic/Prosthetic subsequent, [phone]- Aquatic Therapy, [phone]- Electrical stimulation (manual), [id number]- Ultrasound, [phone] (1-2 muscles), [id number] (3+ muscles)- Dry Needling, Patient/Family education, Balance training, Stair training, Taping, Joint mobilization, Spinal mobilization, Visual/preceptual remediation/compensation, DME instructions, Cryotherapy, and Moist heat  PLAN FOR NEXT SESSION: Discharge from PT 05/04/24   Damien Caulk, PT 05/04/2024, 1:01 PM

## 2024-05-15 ENCOUNTER — Telehealth: Payer: Self-pay | Admitting: Family Medicine

## 2024-05-15 MED ORDER — AMPHETAMINE-DEXTROAMPHET ER 20 MG PO CP24: 20.0000 mg | ORAL_CAPSULE | Freq: Every day | ORAL | 0 refills | Status: DC

## 2024-05-15 NOTE — Telephone Encounter (Signed)
 Pt called to request medication refill  amphetamine -dextroamphetamine  (ADDERALL  XR) 20 MG 24 hr capsule   Pt medication is to be sent to  [id number] [address], [place] - [address] AT [facility] Phone: [phone]  Fax: [phone]

## 2024-05-30 ENCOUNTER — Encounter: Payer: Self-pay | Admitting: Emergency Medicine

## 2024-05-30 ENCOUNTER — Ambulatory Visit: Admission: EM | Admit: 2024-05-30 | Discharge: 2024-05-30 | Disposition: A | Discharge status: Home / Self Care

## 2024-05-30 DIAGNOSIS — M62838 Other muscle spasm: Secondary | ICD-10-CM
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
## 2024-06-15 ENCOUNTER — Telehealth: Payer: Self-pay | Admitting: Family Medicine

## 2024-06-15 MED ORDER — AMPHETAMINE-DEXTROAMPHET ER 20 MG PO CP24: 20.0000 mg | ORAL_CAPSULE | Freq: Every day | ORAL | 0 refills | Status: AC

## 2024-06-15 NOTE — Telephone Encounter (Signed)
 Pt called to request medication refill  amphetamine -dextroamphetamine  (ADDERALL  XR) 20 MG 24 hr capsule   Pt medication is to be sent to  [id number] [address], [place] - [address] AT [facility] Phone: [phone]  Fax: [phone]

## 2024-06-15 NOTE — Telephone Encounter (Signed)
 Pt Last Seen 03/07/24 Upcoming Appointment 10/05/24  Adderall  20 mg 05/15/24

## 2024-10-05 ENCOUNTER — Ambulatory Visit: Admitting: Neurology
